# Patient Record
Sex: Female | Born: 1935 | Race: White | Hispanic: No | Marital: Married | Smoking: Never smoker
Health system: Southern US, Community
[De-identification: ages and names within clinical notes are randomized; demographics above are authoritative.]

## PROBLEM LIST (undated history)

## (undated) DIAGNOSIS — E785 Hyperlipidemia, unspecified: Secondary | ICD-10-CM

## (undated) DIAGNOSIS — I10 Essential (primary) hypertension: Secondary | ICD-10-CM

## (undated) DIAGNOSIS — I2510 Atherosclerotic heart disease of native coronary artery without angina pectoris: Secondary | ICD-10-CM

## (undated) DIAGNOSIS — R112 Nausea with vomiting, unspecified: Secondary | ICD-10-CM

## (undated) DIAGNOSIS — D649 Anemia, unspecified: Secondary | ICD-10-CM

## (undated) DIAGNOSIS — I350 Nonrheumatic aortic (valve) stenosis: Secondary | ICD-10-CM

## (undated) DIAGNOSIS — Z952 Presence of prosthetic heart valve: Secondary | ICD-10-CM

## (undated) DIAGNOSIS — I4819 Other persistent atrial fibrillation: Secondary | ICD-10-CM

## (undated) DIAGNOSIS — W19XXXA Unspecified fall, initial encounter: Secondary | ICD-10-CM

## (undated) DIAGNOSIS — N83209 Unspecified ovarian cyst, unspecified side: Secondary | ICD-10-CM

## (undated) DIAGNOSIS — N189 Chronic kidney disease, unspecified: Secondary | ICD-10-CM

## (undated) DIAGNOSIS — Z8719 Personal history of other diseases of the digestive system: Secondary | ICD-10-CM

## (undated) DIAGNOSIS — I447 Left bundle-branch block, unspecified: Secondary | ICD-10-CM

## (undated) DIAGNOSIS — I6529 Occlusion and stenosis of unspecified carotid artery: Secondary | ICD-10-CM

## (undated) DIAGNOSIS — H9190 Unspecified hearing loss, unspecified ear: Secondary | ICD-10-CM

## (undated) DIAGNOSIS — J189 Pneumonia, unspecified organism: Secondary | ICD-10-CM

## (undated) DIAGNOSIS — H5440 Blindness, one eye, unspecified eye: Secondary | ICD-10-CM

## (undated) DIAGNOSIS — K219 Gastro-esophageal reflux disease without esophagitis: Secondary | ICD-10-CM

## (undated) DIAGNOSIS — Z9889 Other specified postprocedural states: Secondary | ICD-10-CM

## (undated) DIAGNOSIS — Z951 Presence of aortocoronary bypass graft: Secondary | ICD-10-CM

## (undated) DIAGNOSIS — M1990 Unspecified osteoarthritis, unspecified site: Secondary | ICD-10-CM

## (undated) HISTORY — PX: ABDOMINAL HYSTERECTOMY: SHX81

## (undated) HISTORY — DX: Occlusion and stenosis of unspecified carotid artery: I65.29

## (undated) HISTORY — DX: Hyperlipidemia, unspecified: E78.5

## (undated) HISTORY — DX: Blindness, one eye, unspecified eye: H54.40

## (undated) HISTORY — PX: EYE SURGERY: SHX253

## (undated) HISTORY — PX: CHOLECYSTECTOMY: SHX55

## (undated) HISTORY — PX: APPENDECTOMY: SHX54

## (undated) HISTORY — DX: Other persistent atrial fibrillation: I48.19

## (undated) HISTORY — DX: Atherosclerotic heart disease of native coronary artery without angina pectoris: I25.10

## (undated) HISTORY — DX: Left bundle-branch block, unspecified: I44.7

## (undated) HISTORY — DX: Presence of aortocoronary bypass graft: Z95.1

## (undated) HISTORY — DX: Unspecified ovarian cyst, unspecified side: N83.209

## (undated) HISTORY — DX: Nonrheumatic aortic (valve) stenosis: I35.0

## (undated) HISTORY — PX: CARDIAC VALVE REPLACEMENT: SHX585

## (undated) HISTORY — DX: Essential (primary) hypertension: I10

## (undated) HISTORY — PX: NECK SURGERY: SHX720

## (undated) HISTORY — DX: Unspecified fall, initial encounter: W19.XXXA

## (undated) HISTORY — PX: BACK SURGERY: SHX140

---

## 1998-04-25 ENCOUNTER — Ambulatory Visit (HOSPITAL_COMMUNITY): Admission: RE | Admit: 1998-04-25 | Discharge: 1998-04-25 | Payer: Self-pay | Admitting: Family Medicine

## 1998-04-25 ENCOUNTER — Encounter: Payer: Self-pay | Admitting: Family Medicine

## 1998-05-14 ENCOUNTER — Ambulatory Visit (HOSPITAL_COMMUNITY): Admission: RE | Admit: 1998-05-14 | Discharge: 1998-05-14 | Payer: Self-pay | Admitting: Ophthalmology

## 1998-10-16 ENCOUNTER — Encounter: Payer: Self-pay | Admitting: Neurosurgery

## 1998-10-16 ENCOUNTER — Ambulatory Visit (HOSPITAL_COMMUNITY): Admission: RE | Admit: 1998-10-16 | Discharge: 1998-10-16 | Payer: Self-pay | Admitting: Neurosurgery

## 1998-12-18 ENCOUNTER — Encounter: Payer: Self-pay | Admitting: Neurosurgery

## 1998-12-20 ENCOUNTER — Observation Stay (HOSPITAL_COMMUNITY): Admission: RE | Admit: 1998-12-20 | Discharge: 1998-12-21 | Payer: Self-pay | Admitting: Neurosurgery

## 1998-12-20 ENCOUNTER — Encounter: Payer: Self-pay | Admitting: Neurosurgery

## 1999-04-14 ENCOUNTER — Ambulatory Visit (HOSPITAL_COMMUNITY): Admission: RE | Admit: 1999-04-14 | Discharge: 1999-04-14 | Payer: Self-pay | Admitting: Neurosurgery

## 1999-04-14 ENCOUNTER — Encounter: Payer: Self-pay | Admitting: Neurosurgery

## 1999-05-02 ENCOUNTER — Encounter: Admission: RE | Admit: 1999-05-02 | Discharge: 1999-07-31 | Payer: Self-pay | Admitting: Anesthesiology

## 1999-10-31 ENCOUNTER — Ambulatory Visit (HOSPITAL_COMMUNITY): Admission: RE | Admit: 1999-10-31 | Discharge: 1999-10-31 | Payer: Self-pay | Admitting: Neurosurgery

## 1999-10-31 ENCOUNTER — Encounter: Payer: Self-pay | Admitting: Neurosurgery

## 1999-11-06 ENCOUNTER — Encounter: Payer: Self-pay | Admitting: Neurosurgery

## 1999-11-11 ENCOUNTER — Encounter: Payer: Self-pay | Admitting: Neurosurgery

## 1999-11-11 ENCOUNTER — Ambulatory Visit (HOSPITAL_COMMUNITY): Admission: RE | Admit: 1999-11-11 | Discharge: 1999-11-11 | Payer: Self-pay | Admitting: Neurosurgery

## 2000-11-25 ENCOUNTER — Encounter: Payer: Self-pay | Admitting: Neurosurgery

## 2000-11-27 ENCOUNTER — Inpatient Hospital Stay (HOSPITAL_COMMUNITY): Admission: AD | Admit: 2000-11-27 | Discharge: 2000-11-27 | Payer: Self-pay | Admitting: Neurosurgery

## 2000-11-27 ENCOUNTER — Encounter: Payer: Self-pay | Admitting: Neurosurgery

## 2001-01-15 ENCOUNTER — Encounter: Payer: Self-pay | Admitting: Neurosurgery

## 2001-01-15 ENCOUNTER — Ambulatory Visit (HOSPITAL_COMMUNITY): Admission: RE | Admit: 2001-01-15 | Discharge: 2001-01-15 | Payer: Self-pay | Admitting: Neurosurgery

## 2002-06-15 ENCOUNTER — Encounter: Payer: Self-pay | Admitting: General Surgery

## 2002-06-15 ENCOUNTER — Encounter: Admission: RE | Admit: 2002-06-15 | Discharge: 2002-06-15 | Payer: Self-pay | Admitting: General Surgery

## 2002-06-17 ENCOUNTER — Encounter (INDEPENDENT_AMBULATORY_CARE_PROVIDER_SITE_OTHER): Payer: Self-pay | Admitting: *Deleted

## 2002-06-17 ENCOUNTER — Ambulatory Visit (HOSPITAL_BASED_OUTPATIENT_CLINIC_OR_DEPARTMENT_OTHER): Admission: RE | Admit: 2002-06-17 | Discharge: 2002-06-17 | Payer: Self-pay | Admitting: General Surgery

## 2002-07-25 ENCOUNTER — Ambulatory Visit (HOSPITAL_COMMUNITY): Admission: RE | Admit: 2002-07-25 | Discharge: 2002-07-25 | Payer: Self-pay | Admitting: Neurosurgery

## 2002-07-25 ENCOUNTER — Encounter: Payer: Self-pay | Admitting: Neurosurgery

## 2002-09-14 ENCOUNTER — Inpatient Hospital Stay (HOSPITAL_COMMUNITY): Admission: RE | Admit: 2002-09-14 | Discharge: 2002-09-15 | Payer: Self-pay | Admitting: Neurosurgery

## 2002-09-14 ENCOUNTER — Encounter: Payer: Self-pay | Admitting: Neurosurgery

## 2003-01-24 ENCOUNTER — Encounter: Payer: Self-pay | Admitting: Neurosurgery

## 2003-01-24 ENCOUNTER — Encounter: Admission: RE | Admit: 2003-01-24 | Discharge: 2003-01-24 | Payer: Self-pay | Admitting: Neurosurgery

## 2003-08-27 ENCOUNTER — Ambulatory Visit (HOSPITAL_COMMUNITY): Admission: RE | Admit: 2003-08-27 | Discharge: 2003-08-27 | Payer: Self-pay | Admitting: Neurosurgery

## 2003-08-31 ENCOUNTER — Encounter: Admission: RE | Admit: 2003-08-31 | Discharge: 2003-08-31 | Payer: Self-pay | Admitting: Neurosurgery

## 2006-05-05 ENCOUNTER — Ambulatory Visit (HOSPITAL_COMMUNITY): Admission: RE | Admit: 2006-05-05 | Discharge: 2006-05-05 | Payer: Self-pay | Admitting: Neurosurgery

## 2006-10-29 ENCOUNTER — Ambulatory Visit (HOSPITAL_COMMUNITY): Admission: RE | Admit: 2006-10-29 | Discharge: 2006-10-29 | Payer: Self-pay | Admitting: Neurosurgery

## 2007-07-08 HISTORY — PX: CORONARY ARTERY BYPASS GRAFT: SHX141

## 2007-07-08 HISTORY — PX: AORTIC VALVE REPLACEMENT: SHX41

## 2007-07-16 ENCOUNTER — Inpatient Hospital Stay (HOSPITAL_BASED_OUTPATIENT_CLINIC_OR_DEPARTMENT_OTHER): Admission: RE | Admit: 2007-07-16 | Discharge: 2007-07-16 | Payer: Self-pay | Admitting: *Deleted

## 2007-07-20 ENCOUNTER — Ambulatory Visit (HOSPITAL_COMMUNITY): Admission: RE | Admit: 2007-07-20 | Discharge: 2007-07-20 | Payer: Self-pay | Admitting: *Deleted

## 2007-07-20 ENCOUNTER — Encounter (INDEPENDENT_AMBULATORY_CARE_PROVIDER_SITE_OTHER): Payer: Self-pay | Admitting: *Deleted

## 2007-07-22 ENCOUNTER — Ambulatory Visit: Payer: Self-pay | Admitting: Thoracic Surgery (Cardiothoracic Vascular Surgery)

## 2007-07-28 ENCOUNTER — Encounter: Payer: Self-pay | Admitting: Thoracic Surgery (Cardiothoracic Vascular Surgery)

## 2007-07-28 ENCOUNTER — Inpatient Hospital Stay (HOSPITAL_COMMUNITY)
Admission: RE | Admit: 2007-07-28 | Discharge: 2007-08-04 | Payer: Self-pay | Admitting: Thoracic Surgery (Cardiothoracic Vascular Surgery)

## 2007-07-28 ENCOUNTER — Ambulatory Visit: Payer: Self-pay | Admitting: Thoracic Surgery (Cardiothoracic Vascular Surgery)

## 2007-07-28 DIAGNOSIS — Z951 Presence of aortocoronary bypass graft: Secondary | ICD-10-CM | POA: Insufficient documentation

## 2007-07-28 DIAGNOSIS — Z953 Presence of xenogenic heart valve: Secondary | ICD-10-CM | POA: Insufficient documentation

## 2007-08-20 ENCOUNTER — Ambulatory Visit: Payer: Self-pay | Admitting: Thoracic Surgery (Cardiothoracic Vascular Surgery)

## 2007-08-20 ENCOUNTER — Encounter
Admission: RE | Admit: 2007-08-20 | Discharge: 2007-08-20 | Payer: Self-pay | Admitting: Thoracic Surgery (Cardiothoracic Vascular Surgery)

## 2007-09-06 ENCOUNTER — Ambulatory Visit: Payer: Self-pay | Admitting: Thoracic Surgery (Cardiothoracic Vascular Surgery)

## 2007-09-06 ENCOUNTER — Ambulatory Visit (HOSPITAL_COMMUNITY)
Admission: RE | Admit: 2007-09-06 | Discharge: 2007-09-06 | Payer: Self-pay | Admitting: Thoracic Surgery (Cardiothoracic Vascular Surgery)

## 2007-10-07 ENCOUNTER — Inpatient Hospital Stay (HOSPITAL_COMMUNITY): Admission: RE | Admit: 2007-10-07 | Discharge: 2007-10-08 | Payer: Self-pay | Admitting: Ophthalmology

## 2008-04-16 IMAGING — CR DG CHEST 2V
2 series · 2 of 2 positions shown · non-contrast
Comparison: 08/20/07.

CLINICAL DATA: CABG July 2007. 
 CHEST - 2 VIEW:

[w chest pa]
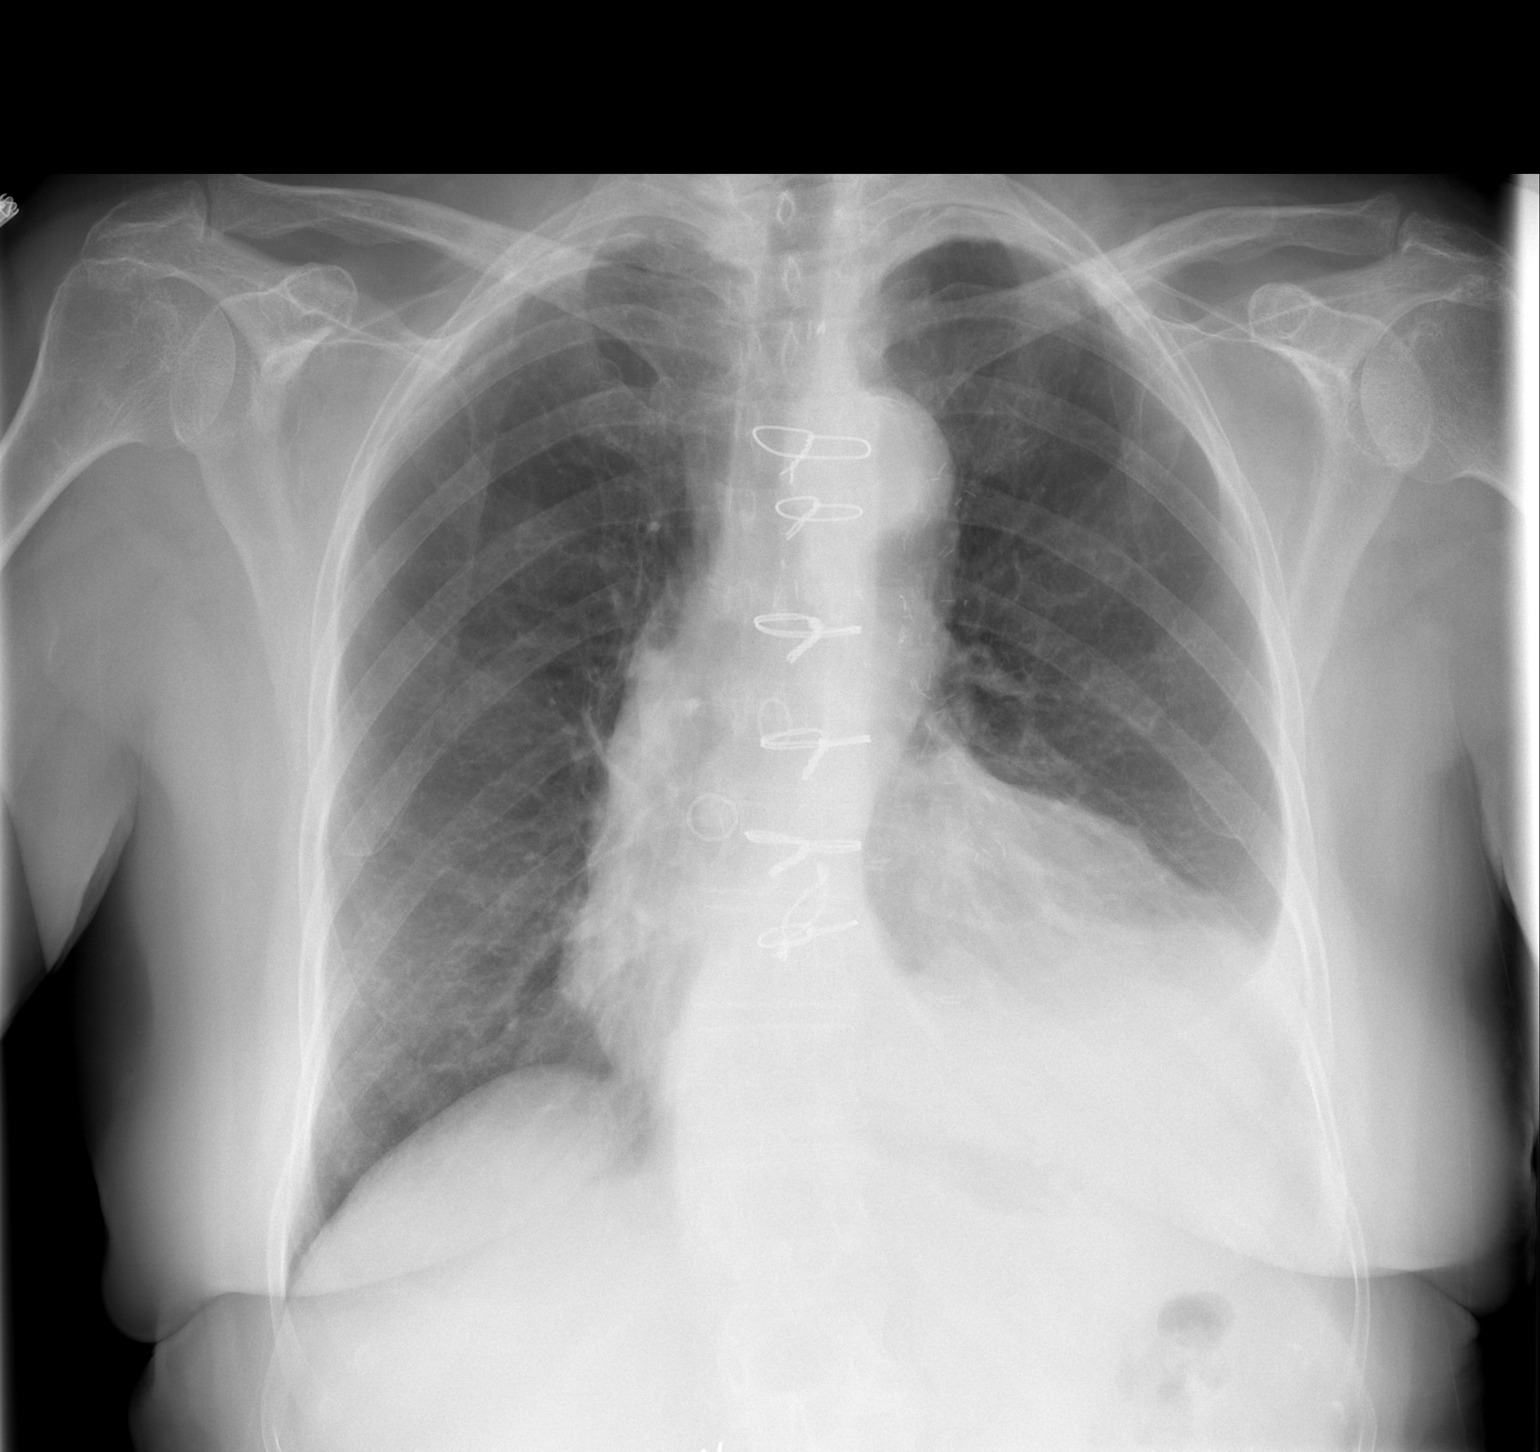

[w chest lat]
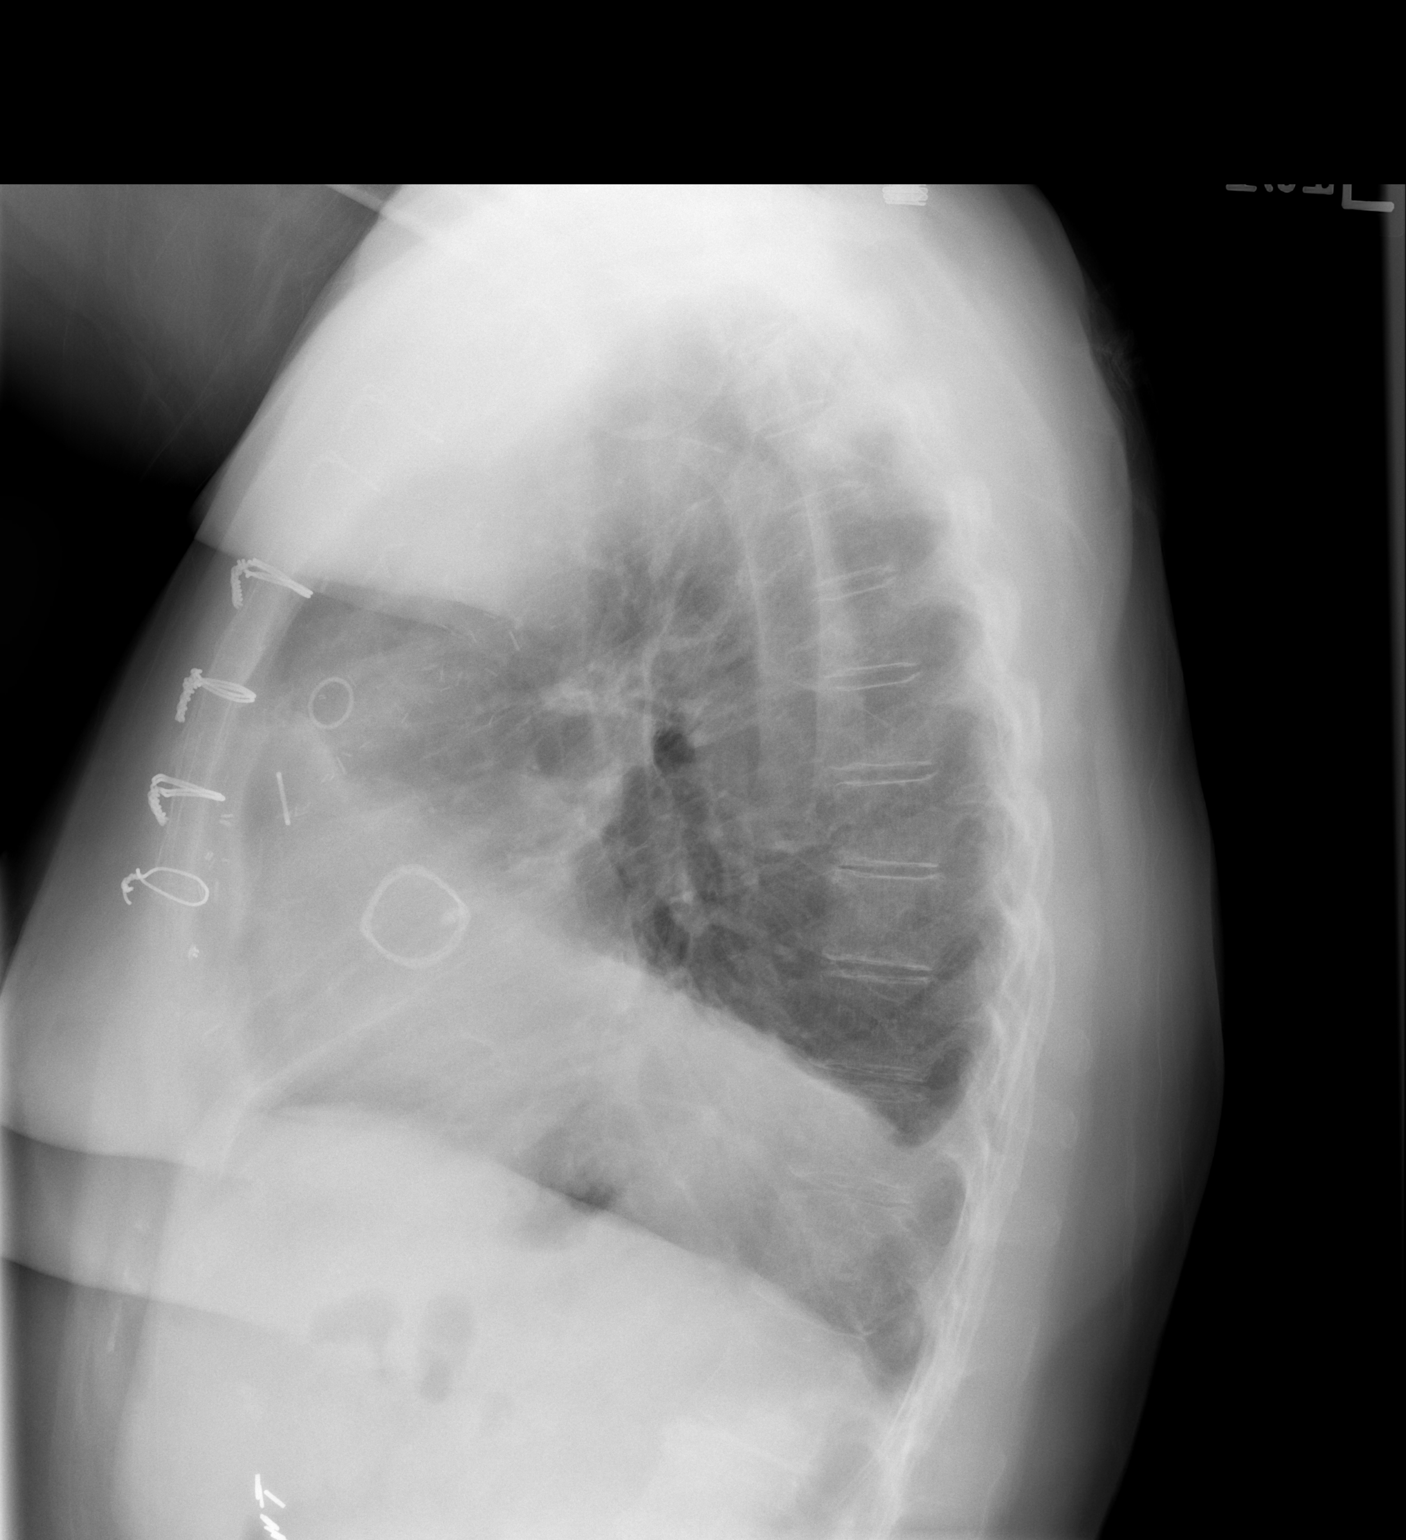

[2 of 2 positions shown; findings below may reference images not displayed]

FINDINGS: There is little change in volume of the left pleural effusion with left basilar atelectasis present.  The right lung is clear.  No pneumothorax is seen.  Cardiomegaly is stable.
IMPRESSION: Little change in left pleural effusion with left basilar atelectasis.

## 2008-12-13 ENCOUNTER — Encounter: Admission: RE | Admit: 2008-12-13 | Discharge: 2008-12-13 | Payer: Self-pay | Admitting: Neurosurgery

## 2009-01-31 ENCOUNTER — Ambulatory Visit: Payer: Self-pay | Admitting: Vascular Surgery

## 2009-07-18 ENCOUNTER — Ambulatory Visit: Payer: Self-pay | Admitting: Vascular Surgery

## 2010-03-27 ENCOUNTER — Ambulatory Visit: Payer: Self-pay | Admitting: Cardiology

## 2010-03-29 ENCOUNTER — Ambulatory Visit: Payer: Self-pay | Admitting: Cardiology

## 2010-07-18 ENCOUNTER — Ambulatory Visit
Admission: RE | Admit: 2010-07-18 | Discharge: 2010-07-18 | Payer: Self-pay | Source: Home / Self Care | Attending: Vascular Surgery | Admitting: Vascular Surgery

## 2010-07-27 NOTE — Procedures (Unsigned)
CAROTID DUPLEX EXAM  INDICATION:  Carotid disease.  HISTORY: Diabetes:  No. Cardiac:  Open heart surgery in 2009. Hypertension:  No. Smoking:  No. Previous Surgery:  No. CV History:  Complaint of dizziness. Amaurosis Fugax No, Paresthesias No, Hemiparesis No.                                      RIGHT             LEFT Brachial systolic pressure:         154               156 Brachial Doppler waveforms:         Normal            Normal Vertebral direction of flow:        Antegrade         Antegrade DUPLEX VELOCITIES (cm/sec) CCA peak systolic                   90                75 ECA peak systolic                   125               154 ICA peak systolic                   127               104 ICA end diastolic                   41                26 PLAQUE MORPHOLOGY:                  Mixed             Mixed PLAQUE AMOUNT:                      Mild              Minimal PLAQUE LOCATION:                    ICA/CCA           ICA/CCA  IMPRESSION: 1. Doppler velocities suggest a 40% to 59% stenosis of the right     proximal internal carotid artery. 2. No hemodynamically significant stenosis of the left internal     carotid artery noted with mild plaque formations as described     above. 3. No significant change noted when compared to the previous     examination on 07/18/2009.  ___________________________________________ Janetta Hora. Fields, MD  CH/MEDQ  D:  07/18/2010  T:  07/18/2010  Job:  [id number]

## 2010-07-28 ENCOUNTER — Encounter: Payer: Self-pay | Admitting: Thoracic Surgery (Cardiothoracic Vascular Surgery)

## 2010-08-09 ENCOUNTER — Encounter: Payer: Self-pay | Admitting: Cardiology

## 2010-08-09 DIAGNOSIS — H5440 Blindness, one eye, unspecified eye: Secondary | ICD-10-CM | POA: Insufficient documentation

## 2010-08-09 DIAGNOSIS — M47819 Spondylosis without myelopathy or radiculopathy, site unspecified: Secondary | ICD-10-CM | POA: Insufficient documentation

## 2010-08-09 DIAGNOSIS — I10 Essential (primary) hypertension: Secondary | ICD-10-CM | POA: Insufficient documentation

## 2010-08-09 DIAGNOSIS — E785 Hyperlipidemia, unspecified: Secondary | ICD-10-CM | POA: Insufficient documentation

## 2010-08-09 DIAGNOSIS — R0602 Shortness of breath: Secondary | ICD-10-CM | POA: Insufficient documentation

## 2010-09-26 ENCOUNTER — Ambulatory Visit: Payer: Self-pay | Admitting: Cardiology

## 2010-10-10 ENCOUNTER — Ambulatory Visit: Payer: Self-pay | Admitting: Cardiology

## 2010-10-25 ENCOUNTER — Other Ambulatory Visit: Payer: Self-pay | Admitting: *Deleted

## 2010-10-25 DIAGNOSIS — E7800 Pure hypercholesterolemia, unspecified: Secondary | ICD-10-CM

## 2010-11-01 ENCOUNTER — Encounter: Payer: Self-pay | Admitting: Cardiology

## 2010-11-01 ENCOUNTER — Ambulatory Visit (INDEPENDENT_AMBULATORY_CARE_PROVIDER_SITE_OTHER): Payer: PRIVATE HEALTH INSURANCE | Admitting: Cardiology

## 2010-11-01 ENCOUNTER — Other Ambulatory Visit: Payer: Self-pay | Admitting: Cardiology

## 2010-11-01 ENCOUNTER — Other Ambulatory Visit (INDEPENDENT_AMBULATORY_CARE_PROVIDER_SITE_OTHER): Payer: PRIVATE HEALTH INSURANCE | Admitting: *Deleted

## 2010-11-01 DIAGNOSIS — I10 Essential (primary) hypertension: Secondary | ICD-10-CM

## 2010-11-01 DIAGNOSIS — E7800 Pure hypercholesterolemia, unspecified: Secondary | ICD-10-CM

## 2010-11-01 DIAGNOSIS — Z953 Presence of xenogenic heart valve: Secondary | ICD-10-CM

## 2010-11-01 DIAGNOSIS — Z954 Presence of other heart-valve replacement: Secondary | ICD-10-CM

## 2010-11-01 DIAGNOSIS — E785 Hyperlipidemia, unspecified: Secondary | ICD-10-CM

## 2010-11-01 LAB — HEPATIC FUNCTION PANEL
AST: 18 U/L (ref 0–37)
Alkaline Phosphatase: 69 U/L (ref 39–117)
Total Bilirubin: 1.1 mg/dL (ref 0.3–1.2)

## 2010-11-01 LAB — BASIC METABOLIC PANEL
GFR: 57.98 mL/min — ABNORMAL LOW (ref >60.00)
Potassium: 4.3 mEq/L (ref 3.5–5.1)
Sodium: 139 mEq/L (ref 135–145)

## 2010-11-01 LAB — LIPID PANEL
Total CHOL/HDL Ratio: 5
VLDL: 24.2 mg/dL (ref 0.0–40.0)

## 2010-11-01 NOTE — Assessment & Plan Note (Signed)
Blood pressure is elevated today. I'll start her on hydrochlorothiazide 25 mg a day in addition to her amlodipine and benazepril. I'll have her see Lawson Fiscal in one month for followup lab work. I'll have her see Dr. Antoine Poche in 6 months for followup cardiology care.

## 2010-11-01 NOTE — Assessment & Plan Note (Signed)
She is a soft systolic outflow murmur but otherwise is doing well.

## 2010-11-01 NOTE — Progress Notes (Signed)
Subjective:   [name] is seen today for a followup visit. She has known coronary artery bypass grafting in January 2009 and previous aortic valve replacement with a #21 mm pericardial prosthesis. She had a left internal mammary artery graft to the LAD, saphenous vein graft the right coronary artery, and a sequential saphenous vein graft to obtuse marginal #1 and #2. She had a septal myomectomy because of a small LV outflow tract. She is continued to do well. She doesn't have any recurrent symptoms of chest pain but does have occasional dizziness. Blood pressure readings are elevated today.  Current Outpatient Prescriptions  Medication Sig Dispense Refill  . aspirin 81 MG tablet Take 81 mg by mouth daily.        Marland Kitchen atorvastatin (LIPITOR) 10 MG tablet Take 10 mg by mouth daily.        . benazepril (LOTENSIN) 20 MG tablet Take 20 mg by mouth daily.        . Brimonidine Tartrate-Timolol (COMBIGAN OP) Apply to eye 2 (two) times daily. RIGHT EYE       . Calcium Carbonate (CALTRATE 600 PO) Take by mouth daily.        Marland Kitchen gabapentin (NEURONTIN) 300 MG capsule Take 300 mg by mouth daily.        Marland Kitchen HOMATROPINE HBR OP Apply to eye 2 (two) times daily. Right eye       . prednisoLONE acetate (PRED FORTE) 1 % ophthalmic suspension Place 1 drop into the right eye 2 (two) times daily.        . simvastatin (ZOCOR) 40 MG tablet Take 40 mg by mouth daily.        . Vitamin D, Ergocalciferol, (DRISDOL) 50000 UNITS CAPS Take 50,000 Units by mouth every 7 (seven) days.        Marland Kitchen zolpidem (AMBIEN) 5 MG tablet Take 5 mg by mouth at bedtime. 10MG  HS        . amLODipine (NORVASC) 5 MG tablet Take 5 mg by mouth daily.          Allergies  Allergen Reactions  . Zetia (Ezetimibe) Other (See Comments)    DIZZINESS    Patient Active Problem List  Diagnoses  . SOB (shortness of breath) on exertion  . S/P aortic valve replacement with bioprosthetic valve  . Dyslipidemia  . Hypertension  . Blindness of right eye  . Spinal  arthritis    History  Smoking status  . Never Smoker   Smokeless tobacco  . Not on file    History  Alcohol Use No    Family History  Problem Relation Age of Onset  . Cancer Mother   . Heart attack Father   . Heart attack Brother     Review of Systems:   The patient denies any heat or cold intolerance.  No weight gain or weight loss.  The patient denies headaches or blurry vision.  There is no cough or sputum production.  The patient denies dizziness.  There is no hematuria or hematochezia.  The patient denies any muscle aches or arthritis.  The patient denies any rash.  The patient denies frequent falling or instability.  There is no history of depression or anxiety.  All other systems were reviewed and are negative.   Physical Exam:   Weight is 163. Blood pressure is 150/108 sitting, heart rate 64. There's a soft systolic outflow murmur. The aortic valve sounds are normal.The head is normocephalic and atraumatic.  Pupils are equally round and  reactive to light.  Sclerae nonicteric.  Conjunctiva is clear.  Oropharynx is unremarkable.  There's adequate oral airway.  Neck is supple there are no masses.  Thyroid is not enlarged.  There is no lymphadenopathy.  Lungs are clear.  Chest is symmetric.  Heart shows a regular rate and rhythm.  S1 and S2 are normal.    Abdomen is soft normal bowel sounds.  There is no organomegaly.  Genital and rectal deferred.  Extremities are without edema.  Peripheral pulses are adequate.  Neurologically intact.  Full range of motion.  The patient is not depressed.  Skin is warm and dry.  Assessment / Plan:

## 2010-11-01 NOTE — Assessment & Plan Note (Signed)
Lab work today.

## 2010-11-07 ENCOUNTER — Telehealth: Payer: Self-pay | Admitting: *Deleted

## 2010-11-07 NOTE — Telephone Encounter (Signed)
Verified pt is taking Lipitor 10mg  daily; explained to pt we would call back with lab results after discussing the Lipitor with Dr. Deborah Chalk

## 2010-11-11 ENCOUNTER — Telehealth: Payer: Self-pay | Admitting: *Deleted

## 2010-11-11 NOTE — Telephone Encounter (Signed)
Would try to increase the Lipitor to 20mg . Recheck labs in 3 months.

## 2010-11-11 NOTE — Telephone Encounter (Signed)
Instructed pt per lab work results to increase Lipitor 20mg  daily per Norma Fredrickson, NP

## 2010-11-19 NOTE — Assessment & Plan Note (Signed)
OFFICE VISIT   [name], [name]  DOB:  [birth year]                                        September 06, 2007  CHART #:  [id number]   The patient is a 77 year old woman who had aortic valve replacement and  coronary bypass grafting x4 on January 21.  She was seen in the office  on February 13 at which time she was making slow but steady progress,  and was beginning to be making a little more rapid progress.  She had a  left pleural effusion at that time.  She was treated with diuretics and  a steroid taper.  She now returns for followup.  She states that overall  she feels better than she did when I last saw her, but that she has had  a couple of episodes over the past 2 weeks where she got swimmy  headed.  I take this to mean that she was lightheaded or dizzy.  This  was not orthostatic in nature.  It was not with a sudden change in  position.  At one point she was standing, but had been standing for a  while, and the other time it happened to her while she was sitting.  This would last a couple of minutes and then resolve and not happen  again.  She did not notice any rapid or irregular heart rates.  She did  not have any stroke or TIA-like symptoms, and she was not terribly  concerned by it, but thought she would mention it today.   PHYSICAL EXAMINATION:  The patient is a 77 year old white female in no  acute distress.  Her blood pressure is 145/85, pulse is 82 and regular,  respirations are 18.  Her oxygen saturation is 97% on room air.  Her  lungs have diminished breath sounds at the bases, otherwise clear.  Cardiac exam has a regular rate and rhythm, normal S1 and S2, no murmur.  Her sternum is healing well.  The sternum is stable, the incision is  clean, dry, and intact.  Leg incision is healing well.   Chest x-ray today shows still some mild elevation of the left  hemidiaphragm.  There is no significant residual effusion.   IMPRESSION:  The patient is  now about 6 weeks out from coronary bypass  grafting and aortic valve replacement.  She is doing very well at this  point in time overall.  She is making progress.  She looks much better  than she did 3 weeks ago.  She has not had any chest pain.  Her  incisional pain is minimal.  She has not having to take any narcotics.  She still is in the recovery phase, and I am not sure what to make of  the episodes of lightheadedness.  It does not sound TIA like, and is not  related to changes in positional orthostasis.  She still is on several  medications including Crestor, metoprolol, amiodarone, aspirin, and iron  tablets.  Just to try and simplify matters a little bit, I recommended  that she stop the amiodarone as she is now 6 weeks out from surgery, and  also stop the iron tablets.  She is going to see Dr. Reyes Ivan on Friday  and will notify him of those medication changes.  I will be happy to see  her back at any time that I could be of any further assistance with her  care, but as she is going to be followed by Dr. Reyes Ivan, she does not  need to make any scheduled appointments with me at this time.   Salvatore Decent Dorris Fetch, M.D.  Electronically Signed   SCH/MEDQ  D:  09/06/2007  T:  09/06/2007  Job:  [id number]   cc:   Elmore Guise., M.D.  Richard Derek Mound A. Pool, M.D.

## 2010-11-19 NOTE — Consult Note (Signed)
NEW PATIENT CONSULTATION   KAMILA, BRODA  DOB:  10/13/1932                                        July 22, 2007  CHART #:  [id number]   REASON FOR CONSULTATION:  Aortic stenosis and 3-vessel coronary disease.   HISTORY OF PRESENT ILLNESS:  The patient is a 77 year old woman with a  known history of aortic stenosis which has been mild to moderate  previously.  Over the past several months, she has been having a great  deal of difficulty with pain.  She had neck surgery apparently a fusion  of C5-C6, although I do not have the records, by Dr. Jordan Likes.  But she  continued to have pain in her shoulder and arm and also the pain  radiated up into her neck.  She also says that she has been having some  indigestion type discomfort and some associated shortness of breath but  she denies any discrete chest pain.  She mentioned these symptoms to one  of the physicians and it was recommended that she be worked up by a  cardiologist.  She saw Dr. Lady Deutscher.  She had a nuclear stress test which showed a  reversible inferolateral defect.  She had normal left ventricular  systolic function.  Her ejection fraction was 62%.  On an echocardiogram  she had moderate aortic stenosis with an aortic valve area of 1.2-cm2.  The peak gradient was 28-mmHg, mean gradient was 17-mmHg.  She  subsequently had a cardiac catheterization performed where she was found  to have severe 3-vessel coronary disease.  She had normal right sided  pressures, although her wedge was elevated at 20.  Her cardiac index was  1.9.  Her aortic pressure was 175/107 with a left ventricular pressure  of 210/18.  Her LV EDP was 29.  She only had an approximately 10-mm  gradient on pullback, although there was a 35-mm pressure difference.  She then subsequently had a transesophageal echocardiogram which showed  thickening and moderate calcification of the aortic valve consistent  with moderate aortic stenosis.  The patient has continued to have her episodes of pain.  She has had  some with minimal exertion.   CURRENT MEDICATIONS:  1. Neurontin 300 mg b.i.d.  2. Crestor 20 mg daily.  3. Lotrel 5/20 one tablet daily.  4. Aspirin 81 mg daily.  5. She uses Ambien p.r.n. for sleep.  6. Ultram p.r.n. for pain about once a day for her neck pain.   She has no known drug allergies.   FAMILY HISTORY:  Significant for coronary artery disease in a brother.  Her father also died of a heart attack.  She also has a daughter with  heart disease.   SOCIAL HISTORY:  She works at a [employer] as a Production assistant, radio.  She does  not smoke or drink.  She is married and lives with her husband.   REVIEW OF SYSTEMS:  MUSCULOSKELETAL:  She complains of arthritis type  pain.  EYES:  She also has had a bleeding vessel in her eye.  She sees Dr.  Gwendalyn Ege in [place] regarding that.  She has had multiple shots in  the eye but has not required laser surgery.  She does see black spots.  NEUROLOGIC:  She has not had any stroke or amaurosis fugax symptoms.  All other  systems are negative.   PHYSICAL EXAMINATION:  General:  The patient is a 77 year old white  female in no acute distress.  Vital signs:  Her blood pressure is  165/82, pulse 68, respirations are 18.  Her oxygen saturation is 94% on  room air.  Neurologic:  She is alert and oriented x3, appropriate with  no focal deficits.  HEENT:  She is edentulous.  She is wearing glasses.  Neck:  Supple without thyromegaly or adenopathy.  She does have a right  carotid bruit.  Cardiac:  Regular rate and rhythm.  There is a 3/6  systolic murmur.  Lungs:  Clear with equal breath sounds bilaterally.  She has 2 plus pulses throughout the periphery.  There is no peripheral  edema.  Abdomen:  Soft and nontender.   LABORATORY DATA:  Cardiac catheterization and echocardiogram as noted.  She did have carotid duplex which showed a 60-80% stenosis in the right  internal carotid.   There was no left internal carotid disease.   IMPRESSION:  The patient is a 77 year old woman with severe 3-vessel  coronary disease with progressive anginal symptoms.  She also has  moderate aortic stenosis with some calcification of the valve leaflets.   I have recommended to her that she have coronary artery bypass grafting  along with an aortic valve replacement at the time of her coronary  artery bypass grafting.  The indications, risks, benefits, and  alternatives were discussed in detail with the patient.  She understands  the indication for CABG is survival benefit as well as relief of  symptoms.  She does understand that it is a relative indication to  replacement the aortic valve but that I would highly advise it in her  situation.  The operation may be technically challenging due to the  angulation of the heart and the elongation of the ascending aorta.  I  did recommend that we use a tissue valve for the aortic valve  replacement to avoid the need for lifelong anticoagulation.  We  discussed in detail the indications, risks, benefits, and alternatives.  She understands the risks include, but are not limited to, death,  stroke, MI, DVT, PE, bleeding, possible need for transfusions,  infections, as well as other organ system dysfunction including  respiratory, renal, or GI complications, possible complete heart block  requiring permanent transvenous pacemaker.  She understands and accepts  these risks and agrees to proceed.  We have scheduled her for surgery on  Wednesday, January 21st.  She will be admitted on the day of the  procedure.   Salvatore Decent Dorris Fetch, M.D.  Electronically Signed   SCH/MEDQ  D:  07/22/2007  T:  07/22/2007  Job:  [id number]   cc:   Elmore Guise., M.D.  Richard Watt Climes

## 2010-11-19 NOTE — Op Note (Signed)
NAMEELICIA, LUI NO.:  [id number]   MEDICAL RECORD NO.:  [id number]          PATIENT TYPE:  INP   LOCATION:  2302                         FACILITY:  [facility]   PHYSICIAN:  Burna Forts, M.D.DATE OF BIRTH:  21-Jul-1932   DATE OF PROCEDURE:  07/28/2007  DATE OF DISCHARGE:                               OPERATIVE REPORT   INTRAOPERATIVE TRANSESOPHAGEAL ECHOCARDIOGRAPHIC REPORT:   INDICATIONS FOR PROCEDURE:  Ms. Biskup is a 77 year old female  patient, who has a history of coronary artery disease and aortic  stenosis.  She arrived at the OR today for coronary artery bypass  grafting and aortic valve replacement, to be performed by Dr. Andrey Spearman.  On the morning of surgery, she was brought to the holding  area, where under local anesthesia with sedation, pulmonary catheter and  a radial arterial monitor were inserted.  She was taken to the OR for  induction of general anesthesia, after which the TEE probe is lubricated  and protected, passed oropharyngeal into the stomach and slightly  withdrawn for imaging of the cardiac structures.   PRE-CARDIOPULMONARY BYPASS TEE EXAM:  LEFT VENTRICLE:  The left ventricular chamber is seen initially in the  short-axis view.  It is concentrically hypertrophied in all aspects and  in all segmental areas.  There is good to excellent overall contractile  pattern noted with good thickening of the walls in all areas, consistent  with an excellent left ventricular function.  There is low volume  status, such that papillary muscles are nearly touching one another.  Long axis view again reveals good overall contractile pattern noted, but  again left ventricular wall thickness.  MITRAL VALVE:  This is a thickened mitral valve apparatus seen.  Both  leaflets are visualized well.  Anterior and posterior leaflets multiple  views are obtained.  Again, arteries are somewhat thickening, both with  posterior and anterior leaflets.   However, motion function and  coaptation appear to be satisfactory and, on color Doppler examination,  there is only trivial mitral regurgitant flow noted, again in multiple  views.  LEFT ATRIAL CHAMBER:  Is an essentially normal chamber in size and  function.  The appendage is clear.  No masses are noted within.  AORTIC VALVE:  The aortic valve on initial short axis appearance appears  to be three cusps.  There is calcium noted in the periphery and the  edges of the valve itself.  All leaflets are somewhat mobile, but  restricted in their overall motion, both left, right and noncoronary  cusps are seen.  Plain imagery exam reveals about a 1.1 to 1.2 aortic  valve area.  Deep transgastric view could not be obtained to  satisfactorily estimate aortic valve gradients, but all appearances were  in from previous echo prior to surgery, indicative of moderate aortic  stenosis.  In the left ventricular outflow tract, there was seen a  slight knuckle in the high septal area, which did appear to narrow the  left ventricular outflow tract somewhat.  The measurement, just in about  1 cm in the subaortic area, revealed a  diameter of the left ventricular  area of only about 1.4 cm.  At the level of the aortic valve itself,  there is a 2 cm diameter at the level of the sinotubular junction, and  were dilated at 2.2 cm in diameter.  Above this level, the aortic valve  and this stenotic jet was noted ascending aortic dilatation.  Doppler  color examination across the aortic valve reveals 2+ jet, fairly centric  in its location, but expanding on ascending into the ascending aorta.  There is essentially no regurgitant flow appreciated.  Again, all this  was consistent with moderate aortic stenosis.  RIGHT VENTRICLE:  The right ventricle is considered normal in its  chamber size and function.  Tricuspid valve is seen and appears normal.  There is no regurgitant flow across there.  The right atrium is of   normal chamber size.   The patient was placed on cardiopulmonary bypass.  Coronary artery  bypass grafting is carried out, followed by aortotomy and replacement of  the aortic valve with a #21 pericardial tissue valve.  De-airing  maneuvers were carried out and the patient was rewarmed and separated  from cardiopulmonary bypass with the initial attempt.   POST-CARDIOPULMONARY TEE EXAMINATION:  (Limited exam)  LEFT VENTRICLE IN THE EARLY BYPASS:  This was remarkable for good  contractility noted in posterolateral and anterior walls with a  definitely flattened septum in this early bypass.  Overall contractility  remained good and this was a paced ventricle.  AORTIC VALVE AREA:  In the aortic valve, the valve was seated well.  The  leaflets could easily be visualized, opened appropriately during  systolic ejection, closed satisfactorily, with no regurgitant flow  appreciated.  This appeared to be a totally satisfactory repair in the  aortic valve position.  The rest of the cardiac examination was as  previously described and patient was ultimately returned to the cardiac  intensive care unit in stable condition.           ______________________________  Burna Forts, M.D.     JTM/MEDQ  D:  07/28/2007  T:  07/28/2007  Job:  [id number]

## 2010-11-19 NOTE — Assessment & Plan Note (Signed)
OFFICE VISIT   [name], [name]  DOB:  [birth year]                                        August 20, 2007  CHART #:  [id number]   ADDENDUM:  [name] did note some numbness in the 4th and 5th  fingers on her right hand. 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 I encouraged her to do  some squeezing exercises. Her motor function is intact and this should  improve with time.   Salvatore Decent Dorris Fetch, M.D.  Electronically Signed   SCH/MEDQ  D:  08/20/2007  T:  08/22/2007  Job:  [id number]

## 2010-11-19 NOTE — Op Note (Signed)
[name], [name] NO.:  [id number]   MEDICAL RECORD NO.:  [id number]          PATIENT TYPE:  INP   LOCATION:  5123                         FACILITY:  [facility]   PHYSICIAN:  Chalmers Guest, M.D.     DATE OF BIRTH:  May 13, 1933   DATE OF PROCEDURE:  10/07/2007  DATE OF DISCHARGE:                               OPERATIVE REPORT   PREOPERATIVE DIAGNOSIS:  Uncontrolled neovascular glaucoma right eye.   POSTOPERATIVE DIAGNOSIS:  Uncontrolled neovascular glaucoma right eye.   PROCEDURE:  Ahmed tube shunt with Tutoplast scleral graft for scleral  reinforcement and the mitomycin C.   PROCEDURE:  The patient was transported to the operating room where the  anesthetic which consisted of 2% Xylocaine with epinephrine in a 50/50  mixture with 0.75% Marcaine with an ampul of Wydase was given in a  peribulbar fashion.  Pressure was applied to the globe and then the  patient's face was prepped and draped in the usual sterile fashion.  Following this, with the surgeon sitting superiorly, a 6-0 nylon suture  was passed through clear cornea to infraduct the eye.  With the eye and  infraducted position, a Bishop-Harmon forceps was used to grasp the  conjunctiva at the limbus, a sharp scissors were used to make an  incision and then blunt dissection was carried out forming a fornix-  based conjunctival flap being careful to manipulate the very fragile and  thin conjunctiva.  After the conjunctiva had been recessed posteriorly,  a Tooke blade was used to recess tenons fibers and bleeding was  controlled with cautery.  Following this, mitomycin C 0.4 mg/mL was  placed on a Gelfoam sponge.  It was placed posteriorly in the superior  temporal cul-de-sac for two minutes and then removed and irrigated with  60 mL of balanced salt solution.  Following this, the Ahmed plate was  examined.  The plate was irrigated and primed and noted to pass the  fluid very well.  This was done with the aid  balanced salt solution.  Following this, the Ahmed plate was placed with the eyelets 8 mm  posterior to the scleral limbus.  It was noted at this point that the  conjunctiva was thin and a small buttonhole had formed.  At this point,  using a 6-0 Mersilene suture, the Ahmed plate was sutured to the sclera.  Following this, it was necessary for scleral reinforcement because of a  fragile conjunctiva to use the Tutoplast graft.  The Tutoplast was  fashioned to cover the tube.  A 22-gauge needle was injected and the  anterior chamber after paracentesis track had been performed at the 7  o'clock position and Provisc had been injected in the eye.  The 22-gauge  needle was passed at the limbus at the 11 o'clock position and then  using the tube inserter, the tube was inserted after the tube had been  trimmed bevel up and 9-0 nylon suture was used to suture the tube to  this sclera and then the scleral reinforcement where the Tutoplast took  place by suturing three interrupted 9-0 nylon  sutures.  The scleral  reinforcement was necessary because the conjunctiva at this point was  noted to have two large buttonholes.  The conjunctiva was then brought  forward and sutured.  The conjunctiva just barely missed the limbus but  the Tutoplast graft covered the tube very well and there were posterior  buttonholes that could not be closed because of the fragile conjunctiva  and inferior temporal 5/5 block of conjunctiva was excised and rotated  into position over the button hole and sutured with a 9-0 nylon suture.  Following this, an 8-0 Vicryl suture was used to close the conjunctiva.  BSS was injected.  There was a watertight closure.  No leakage was  noted.  Following this, a subconjunctival injection of Kenalog 4 mg was  given inferior subconjunctival with a 30-gauge needle.  Additional  viscoelastic was injected from the eye through the paracentesis tract  with BSS maintaining the anterior chamber.   Topical TobraDex ointment  was applied to the eye.  A patch and Fox shield were placed and the  patient returned to recovery area in stable condition.      Chalmers Guest, M.D.  Electronically Signed     RW/MEDQ  D:  10/08/2007  T:  10/08/2007  Job:  [id number]   cc:   Fax [phone]

## 2010-11-19 NOTE — Procedures (Signed)
CAROTID DUPLEX EXAM   INDICATION:  Carotid disease.   HISTORY:  Diabetes:  No.  Cardiac:  Open heart surgery in 2009.  Hypertension:  No.  Smoking:  No.  Previous Surgery:  No.  CV History:  Positional dizziness.  Amaurosis Fugax No, Paresthesias No, Hemiparesis No                                       RIGHT             LEFT  Brachial systolic pressure:         140               138  Brachial Doppler waveforms:         Normal            Normal  Vertebral direction of flow:        Antegrade         Antegrade  DUPLEX VELOCITIES (cm/sec)  CCA peak systolic                   85                70  ECA peak systolic                   149               160  ICA peak systolic                   131               93  ICA end diastolic                   43                58  PLAQUE MORPHOLOGY:                  Mixed             Mixed  PLAQUE AMOUNT:                      Mild              Mild  PLAQUE LOCATION:                    ICA / ECA / CCA   ICA / ECA /  bifurcation   IMPRESSION:  1. 40%-59% stenosis of the right internal carotid artery.  2. 1%-39% stenosis of the left internal carotid artery.   ___________________________________________  Janetta Hora Fields, MD   CH/MEDQ  D:  07/18/2009  T:  07/18/2009  Job:  [id number]

## 2010-11-19 NOTE — Cardiovascular Report (Signed)
[name], [name]               ACCOUNT NO.:  [id number]   MEDICAL RECORD NO.:  [id number]          PATIENT TYPE:  OIB   LOCATION:  NA                           FACILITY:  [facility]   PHYSICIAN:  Elmore Guise., M.D.DATE OF BIRTH:  Nov 14, 1932   DATE OF PROCEDURE:  07/16/2007  DATE OF DISCHARGE:                            CARDIAC CATHETERIZATION   INDICATIONS FOR PROCEDURE:  Chest pain, abnormal stress test, patient  with moderate aortic valve stenosis with aortic valve area of 1.2 cm2 by  echo with peak and mean gradients of 28 and 17 mmHg by transthoracic  study.   DESCRIPTION OF PROCEDURE:  The patient was brought to the cardiac cath  lab after appropriate informed consent.  She was prepped and draped in a  sterile fashion.  Approximately 15 mL of 1% lidocaine were used for  local anesthesia.  A 7-French sheath was placed in the right femoral  vein and a 4-French sheath was placed in the right femoral artery  without difficulty.  Right heart catheterization was performed followed  by left heart catheterization, coronary angiogram and LV angiogram.  The  patient tolerated the procedures well, no apparent complications.  She  was transferred from the cardiac cath lab in stable condition.   FINDINGS:  Right heart catheterization measurements.  1. Right atrium:  15 mmHg.  2. RV:  40/8 mmHg.  3. PA:  32/14 mmHg.  4. PCWP 20 mmHg.  5. Cardiac output 3.3 L per minute with cardiac index of 1.9.  Her      aortic measurement with pressure was 175/107 with an LVEF 210/18      and LVEDP of 29 mmHg   Coronaries showed:  1. Left Main:  Mild calcification with mild distal tapering.  2. LAD:  Proximal 70% stenosis followed by mid 60% stenosis and distal      luminal irregularities.  3. D-1:  Small vessel with mild ostial disease.  4. D-2:  Moderate-sized vessel with mild luminal irregularities.  5. Left circumflex:  Nondominant, proximal mild luminal irregularities      with mid 80% to  90% stenosis after branching of a second OM vessel.      Her distal circumflex in the AV groove is small with mild luminal      irregularities.  OM-1:  Small to moderate size with no significant      disease.  OM-2:  Moderate to large vessel with mild luminal      irregularities.  OM-3:  Moderate to large vessel with mild luminal      irregularities.  OM-4 Small vessel with mild luminal regularities.      Faint left-to-right collaterals noted filling the PDA.  6. RCA:  Dominant with 90% to 95% proximal stenosis and tandem 80% to      90% mid and distal stenoses noted.  Her PDA and PLV are patent with      mild luminal irregularities.  7. LV:  EF is 65%.  No wall motion abnormalities.  LVEDP is 29 mmHg.      There was no significant gradient on  pullback; however, LV measured      210/18 and AO 175/107, showing a pressure difference of      approximately 35 mmHg.   IMPRESSION:  1. Obstructive multivessel disease.  2. Borderline high pulmonary pressures.  3. Normal left ventricular systolic function with an ejection fraction      of 65%.  4. History of moderate aortic valve stenosis with recent      echocardiogram showing aortic valve area of 1.2 cm2, peak and mean      gradients of 28 and 17 mmHg.  She does have approximately a 35-mmHg      pressure difference; however, on pullback, gradient was less than      10.  The patient will be scheduled for transesophageal      echocardiogram for further evaluation of her aortic valve disease      prior to referral for coronary artery bypass grafting and aortic      valve replacement.   PLAN:  1. At this time, I would recommend continuing her current medical      therapy.  I will call her ophthalmologist and find what her risk of      ophthalmologic complications regarding for anticoagulation are;      however, the patient will need to be scheduled for a      transesophageal echocardiogram for better evaluation of her aortic      valve  disease and scheduled for surgical referral for coronary      artery bypass grafting and aortic valve replacement.      Elmore Guise., M.D.  Electronically Signed     TWK/MEDQ  D:  07/16/2007  T:  07/16/2007  Job:  [id number]

## 2010-11-19 NOTE — Discharge Summary (Signed)
[name], Peterson NO.:  [id number]   MEDICAL RECORD NO.:  [id number]          PATIENT TYPE:  INP   LOCATION:  2018                         FACILITY:  [facility]   PHYSICIAN:  Salvatore Decent. Dorris Fetch, M.D.DATE OF BIRTH:  1932/07/29   DATE OF ADMISSION:  07/28/2007  DATE OF DISCHARGE:                               DISCHARGE SUMMARY   FINAL DIAGNOSES:  Severe three-vessel coronary artery disease with  moderate aortic stenosis.   IN-HOSPITAL DIAGNOSES:  1. Postoperative atrial fibrillation.  2. Postoperative left pleural effusion.  3. Volume overload postoperatively.   SECONDARY DIAGNOSES:  1. Hypertension.  2. Hyperlipidemia,   OPERATIONS AND PROCEDURES:  1. Intraoperative transesophageal echocardiogram.  2. Coronary artery bypass grafting x4 using a left internal mammary      artery to left anterior descending artery, saphenous vein graft to      right coronary, sequential saphenous vein graft to obtuse marginal      #1 and #2, aortic valve replacement with a 21-mm mitral flow      pericardial prosthesis with septal myomectomy.  3. Endoscopic vein harvest from right leg.  4. Left thoracentesis.   THE PATIENT'S HISTORY AND PHYSICAL AND HOSPITAL COURSE:  [name] is  a 77 year old female who had recent increase in neck and arm pain.  She  has a history of cervical spine disease but noted that this pain was  different in character.  She had a positive stress test.  By  echocardiogram and catheterization, her aortic valve area was between 1  and 1.2 cm2.  She has severe three-vessel coronary artery disease with  the catheterization.  Following these studies, the patient was advised  to undergo coronary artery bypass grafting as well as aortic valve  replacement.  She was seen and evaluated by Dr. Dorris Fetch.  Dr.  Dorris Fetch discussed with the patient risks and benefits of undergoing  these procedures.  The patient acknowledged her understanding and  agreed  to proceed.  Surgery was scheduled for July 28, 2007.  For details of  the patient's past medical history and physical exam, please see  dictated H&P.   The patient was taken to the operating room July 28, 2007, where she  underwent coronary bypass grafting x4 using a left internal mammary  artery to left anterior descending artery, saphenous vein graft to right  coronary, sequential saphenous vein graft to obtuse marginal #1 and #2,  aortic valve replacement with a 21-mm mitral flow pericardial  prosthesis, and septal myomectomy.  She had endoscopic vein harvest from  the right leg.  The patient tolerated this procedure well and was  transferred to the intensive care unit in stable condition.   Postoperatively. the patient was noted to be hemodynamically stable.  She was extubated evening of surgery.  Post extubation, the patient was  alert and oriented x4.  Neurologically intact.  She was placed on nasal  cannula post extubation at 2 liters with saturation greater than 90%.  The patient did obtain a chest x-ray following surgery postop day #1  showing to be stable.  She had minimal drainage from  chest tubes, and  chest tubes discontinued in normal fashion.  Repeat chest x-ray done  postop day #2 showed left lower lobe atelectasis with some left pleural  effusion.  Repeat chest x-ray ordered following day.  This showed a  moderate left pleural effusion.  The patient also noted to be  symptomatic from this with increasing dyspnea on exertion.  Her nasal  cannula had increased to 3 liters with saturation only at 90%.  The  patient had been started on diuretics for volume overload.  After  evaluation and chest x-ray, Dr. Laneta Simmers felt that the patient would  benefit from a left thoracentesis.  Risks and benefits were discussed  with the patient.  The patient acknowledged  understanding and agreed to  proceed.   The left thoracentesis was done July 31, 2007, by Dr. Laneta Simmers;  825 mL  of dark bloody fluid was removed without difficulty.  The patient  tolerated this.  Followup x-ray showed left lower lobe atelectasis and  consolidation.  She was continued on a large dose of diuretics.  Unfortunately, this consolidation and atelectasis persisted on chest x-  ray, and steroid taper dose was started on postop day #5.  Plan is to  continue the steroid taper dose as well as large-dose diuretics and  reevaluate with a repeat chest x-ray in 7-10 days.  This will be planned  to be done as an outpatient.  The patient was increasingly using her  incentive spirometer.  She was able to be weaned off oxygen, saturating  greater than 90% on room air at rest as well as with ambulation.   Postoperatively, the patient was noted to be in normal sinus rhythm.  She was able to be weaned from all drips.  Heart rate and blood pressure  remained stable.  She was started on low-dose beta blocker.  On early  morning of postop day #4,  the patient went into rapid atrial  fibrillation with heart rate 150-170s.  She was started on IV  amiodarone.  After starting IV amiodarone, the patient became lethargic  and felt extremely tired.  Vital signs were repeated. Heart rate had  decreased to 120s but still in atrial fibrillation.  At this time blood  pressure was noted to be 69/45, and Dr. Laneta Simmers was contacted.  Order for  albumin given.  Repeat blood pressure checked following albumin had  improved to 147/65.  The patient remained in atrial fibrillation.   By the afternoon postop day #4, the patient had some converted back to  normal sinus rhythm.  She remained on IV amiodarone.  By postop day #5,  she remained in normal sinus rhythm. IV amiodarone was discontinued, and  she was started on p.o. amiodarone.  The patient was also started on  p.o. digoxin.  Heart rate was monitored and remained in normal sinus  rhythm.  Blood pressure was also followed during this time, and she was  noted to  be hypertensive.  Toprol was increased.  The patient, prior to  discharge, was noted to be in sinus rhythm with stable blood pressure.   Postoperatively, the patient did develop acute blood loss anemia.  Hemoglobin on postop day #1 dropped to 7.6.  The patient received 1 unit  of packed red blood cells.  This was reevaluated in the morning, and  decreased approximately a hemoglobin 8.6,  hematocrit 25.4.  The patient  was monitored, and hemoglobin and hematocrit were followed.  This  remained stable during the remainder of  the postoperative course.  The  patient did not require any further transfusions.  By postop day #6,  hemoglobin was 11.6, hematocrit  34%.   The patient did have slight volume overload postoperatively and had been  started on diuretics for the volume overload as well for the pleural  effusion.  Daily weights were obtained.  The patient was diuresing well  with the Lasix and was back to her preoperative weight prior to  discharge home.  Postoperatively, the patient did develop leukocytosis  with a white count bumping up to 19.8.  She remained afebrile with no  signs of infection.  Urinalysis was checked and was negative.  Following  day, the patient's white count was back near normal and 14.1postop day  #5.  Unfortunately, the following day reevaluated and increased further  to 17.2 postop day #6.  The patient had been started on p.o. steroids  the previous day and was felt secondary to steroid dosing.  This was  monitored.  The patient was working with cardiac rehab daily.  For she  was slow to progress.  Prior to discharge home, the patient was  ambulating well with minimal assistance.  She was also tolerating diet  well.  No nausea, vomiting noted.   Postop day #6, the patient's vital signs were stable.  She was afebrile.  She was saturating greater than 90% on room air.  Weight was 72.8 kg  which was 0.8 kg above her preoperative weight.  Labs postop day #6   showed a white count of 17.2, hemoglobin of 11.6, hematocrit 34.0,  platelet count 233.  The previous day, the patient had a BMP which  showed sodium of 136, potassium 3.6, chloride of 97, bicarb of 28, BUN  of 25, creatinine 1.03, glucose 105.  The patient was noted to be in  normal sinus rhythm.  Pulmonary status was stable with persistent  diminished breath sounds left base.  All incisions were clean, dry and  intact and healing well.   The patient is tentatively ready for discharge home over the next 24-48  hours pending she remains stable.   FOLLOWUP APPOINTMENTS:  Followup appointment arranged with Dr.  Dorris Fetch for August 20, 2007, at 11:45 a.m.  The patient will need  to obtain PA and lateral chest x-ray 30 minutes prior to this  appointment.  She will need to follow up with Dr. Reyes Ivan in 2 weeks.  She will need to contact Dr. Silva Bandy office to make these arrangements.   ACTIVITY:  Patient instructed no driving until released to do so, no  lifting over 10 pounds.  She is told to ambulate 3-4 times per day,  progress as tolerated, and to continue her breathing exercises.   INCISION:  The patient is instructed to shower, washing her incisions  using soap and water.  She is to contact the office if she develops any  drainage or opening from any of her incision sites.   DISCHARGE MEDICATIONS:  1. Aspirin 325 mg daily.  2. Toprol XL 50 mg daily.  3. Crestor 20 mg daily.  4. Niferex 150 mg daily.  5. Lasix 80 mg b.i.d. x1 week, then 80 mg daily.  6. Potassium chloride 40 mEq b.i.d. x1 week, then 40 mEq daily.  7. Digoxin 0.25 mg daily.  8. Amiodarone 400 mg b.i.d.  x14 days, then 200 mg b.i.d.  9. Ultram 50 mg 1-2 tablets q. 4-6 h p.r.n.      Theda Belfast, PA  Salvatore Decent Dorris Fetch, M.D.  Electronically Signed    KMD/MEDQ  D:  08/03/2007  T:  08/03/2007  Job:  [id number]   cc:   Elmore Guise., M.D.

## 2010-11-19 NOTE — Assessment & Plan Note (Signed)
OFFICE VISIT   [name], [name]  DOB:  1932-10-17                                       01/31/2009  ZOXWR#:[id number]   The patient is a 77 year old female referred for carotid stenosis with a  history of some dizziness.  She states that she gets dizzy while sitting  in the chair sometimes or sometimes if she bends over or stands up  quickly.  She denies any symptoms of TIA, amaurosis or stroke.  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  She also has a history  of some back pain and numbness in her left leg which is followed by Dr.  Jordan Likes.   Atherosclerotic risk factors include hypertension and elevated  cholesterol.  She denies a smoking history.  Coronary artery disease  also.   PAST MEDICAL HISTORY:  Otherwise unremarkable.   PAST SURGICAL HISTORY:  Multiple eye operations, coronary artery bypass  grafting in 2009, cholecystectomy and hysterectomy.   MEDICATIONS:  1. Simvastatin 40 mg once a day.  2. Amlodipine 50 mg once a day.  3. Benazepril HCl 20 mg once a day.  4. Gabapentin 300 mg once a day.  5. Ambien 10 mg once a day.  6. Caltrate once a day.  7. Aspirin 81 mg once a day.  8. Eye drops including Omnipred 2 drops once a day, Isopto-Hyoscine 2      drops once a day, timolol maleate 4 drops once a day.   ALLERGIES:  She has no known drug allergies.   FAMILY HISTORY:  Is remarkable for two brothers who had vascular disease  at age 66-78.   SOCIAL HISTORY:  She is married, has three children and is a nonsmoker,  nonconsumer of alcohol.   REVIEW OF SYSTEMS:  She is 5 feet 2 inches, 167 pounds.  NEUROLOGIC:  She has some occasional dizziness as mentioned above.  Cardiac, pulmonary, GI, renal, vascular, orthopedic, psychiatric, ENT  and hematologic review of systems are all negative.   PHYSICAL EXAM:  Vital signs:  Blood pressure is 130/78 in the right arm,  144/80 in the left arm, pulse is 78 and regular.  HEENT:   Unremarkable.  Neck:  Has 2+ carotid pulses.  No bruits appreciated.  Chest:  Clear to  auscultation.  Cardiac:  Exam is regular rate and rhythm without murmur.  She has 2+ carotid pulses bilaterally.  Abdomen:  Soft, nontender,  nondistended.  No masses.  Extremities:  She has 2+ radial, 1+ femoral  and 1+ dorsalis pedis pulses bilaterally.  Neurological:  Shows  symmetric upper extremity and lower extremity motor strength which is  5/5 and symmetric.   She had a carotid duplex exam on 12/01/2008 at [facility]  which showed a 40-60% stenosis bilaterally using our velocity criteria.  By velocity criteria there the right side was 60-80% but more towards  the 60% side.  She had bilateral vertebral flow which was antegrade.   In summary, I believe that most likely the patient has bilateral 60%  carotid stenosis.  She is currently asymptomatic.  I believe her  dizziness is more related to posture rather than carotid stenosis.  I  believe the best option for her would be a repeat carotid duplex exam in  six months' time as well as continued risk factor modification.  In  addition, since she does have a family history of abdominal aortic  aneurysm in her brother I believe she should have an ultrasound of aorta  at her next visit to rule out abdominal aortic aneurysm as well.   Janetta Hora. Fields, MD  Electronically Signed   CEF/MEDQ  D:  01/31/2009  T:  02/01/2009  Job:  [id number]   cc:   Tarri Fuller

## 2010-11-19 NOTE — Procedures (Signed)
DUPLEX ULTRASOUND OF ABDOMINAL AORTA   INDICATION:  Family history of abdominal aortic aneurysm.   HISTORY:  Diabetes:  No  Cardiac:  Open heart surgery in 2009  Hypertension:  Yes  Smoking:  No  Family History:  Brother had AAA.  Previous Surgery:  No   DUPLEX EXAM:         AP (cm)                   TRANSVERSE (cm)  Proximal             2.5 cm                    2.1 cm  Mid                  2.2 cm                    2.1 cm  Distal               1.5 cm                    1.5 cm  Right Iliac          1.2 cm                    1.1 cm  Left Iliac           1.2 cm                    1.1 cm   PREVIOUS:  Date:  AP:  TRANSVERSE:   IMPRESSION:  No evidence of an abdominal aortic aneurysm noted.   ___________________________________________  Janetta Hora Fields, MD   CH/MEDQ  D:  07/18/2009  T:  07/18/2009  Job:  [id number]

## 2010-11-19 NOTE — Op Note (Signed)
NAMEDARL, BRISBIN NO.:  [id number]   MEDICAL RECORD NO.:  [id number]          PATIENT TYPE:  INP   LOCATION:  2302                         FACILITY:  [facility]   PHYSICIAN:  Salvatore Decent. Dorris Fetch, M.D.DATE OF BIRTH:  [birth year]   DATE OF PROCEDURE:  07/28/2007  DATE OF DISCHARGE:                               OPERATIVE REPORT   PREOPERATIVE DIAGNOSIS:  Severe three-vessel coronary disease with  moderate aortic stenosis.   POSTOPERATIVE DIAGNOSIS:  Severe three-vessel coronary disease with  moderate aortic stenosis, plus subaortic outflow tract obstruction.   PROCEDURE:  Median sternotomy, extracorporeal circulation, coronary  bypass grafting x4 (left internal mammary artery to left anterior  descending artery, saphenous vein graft to right coronary, sequential  saphenous vein graft obtuse marginal 1 and 2, aortic valve replacement  with 21-mm Mitroflow pericardial prosthesis, septal myomectomy,  endoscopic vein harvest, right leg.   SURGEON:  Salvatore Decent. Dorris Fetch, M.D.   ASSISTANT:  Kerin Perna, M.D.   SECOND ASSISTANT:  Coral Ceo, P.A.   ANESTHESIA:  General.   FINDINGS:  Prebypass transesophageal echocardiography revealed good left  ventricular function, septal hypertrophy with some dynamic outflow tract  obstruction, tricuspid calcified aortic stenosis.  Postbypass  transesophageal echocardiography revealed good function of the  prosthetic valve with improved gradient across left ventricular outflow  tract.  Aortic valve was tricuspid, calcific and stenotic.  There was  some mild annular calcification, a penetrating ulcer in the ascending  aorta just above the annulus, good-quality targets, and good quality  conduits.   CLINICAL NOTE:  Ms. Killingsworth is a 77 year old woman has had a recent  increase in neck and arm pain.  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  She had a  positive  stress test.  By echo and catheterization, her aortic valve  area was between 1 and 1.2 cm2.  She had severe three-vessel coronary  disease at catheterization.  She was advised to undergo coronary bypass  grafting as well as aortic valve replacement at the time of surgery.  The indications, risks, benefits and alternatives of valve prosthesis  options and the possibility of need for anticoagulation were discussed  in detail with the patient.  She understood, accepted the risks, and  agreed to proceed.   OPERATIVE NOTE:  Ms. Wiegand was brought to the preoperative holding  area on July 28, 2007.  There, the anesthesia service placed lines  for monitoring arterial central venous and pulmonary arterial pressure.  Intravenous antibiotics were administered.  The patient was taken to the  operating room, anesthetized, and intubated.  Transesophageal  echocardiography was performed by Dr. Sharee Holster.  Please see his  notes for further details and confirmed moderate aortic stenosis but  also demonstrated left ventricular outflow tract gradient secondary to  septal hypertrophy.  There was good left ventricular function.  There  was no significant mitral valve insufficiency.  The chest, abdomen, and  legs were then prepped and draped in the usual fashion.  Incision was  made in the medial aspect of the  right leg at the level of knee.  The  greater saphenous vein was harvested from upper calf to the groin.  It  was of excellent quality.  The median sternotomy was performed.  There  was sternal osteoporosis.  The left internal mammary artery was  harvested using standard technique.  Five thousand units of heparin was  administered during the vessel harvest, and the remaining full heparin  dose was given prior to opening the pericardium.   The pericardium was opened.  The ascending aorta was inspected.  It was  enlarged about 3.5 cm in diameter.  The aorta was cannulated via  concentric two  Ethibond pledgeted pursestring sutures.  A dual stage  venous cannula was placed via pursestring suture in the right atrial  appendage.  Cardiopulmonary bypass was instituted.  Then, the patient  was cooled to 28 degrees Celsius.  The coronary arteries were inspected,  and anastomotic sites were chosen.  The conduits were inspected and cut  to length.  A left ventricular vent was placed via pursestring suture in  the right superior pulmonary vein.  A retrograde cardioplegic cannula  placed via pursestring suture in the right atrium and directed in the  coronary sinus.  An antegrade cardioplegic cannula was placed in the  ascending aorta.   The aorta was crossclamped.  The left ventricle was emptied via aortic  root vent.  Cardiac arrest was then achieved with a combination of cold  antegrade and retrograde blood cardioplegia and topical iced saline.  Initial 500 mL of cardioplegia was administered antegrade.  There was a  rapid diastolic arrest and good cooling, and an additional 250 mL was  administered via the retrograde cannula.  The myocardial septal  temperature fell to less than 10 degrees Celsius.  The following distal  anastomoses were performed.   First, a reversed saphenous vein graft was placed end-to-side to the  distal right coronary which was a 1.5-mm good-quality target.  The vein  graft was good quality.  The anastomosis was performed with a running 7-  0 Prolene suture.  There was excellent flow through the graft.  Cardioplegia was administered.  A small leak was repaired with a single  7-0 Prolene suture.   Next, reversed saphenous vein graft was placed sequentially to obtuse  marginals 1 and 2.  These were both 1.5-mm good-quality targets.  The  vein graft was anastomosed side-to-side to obtuse marginal 1 and end-to-  side to obtuse marginal 2.  Both were performed with running 7-0 Prolene  sutures.  There was good flow through the vein graft and good   hemostasis.   Next, the left internal mammary artery was brought through a window in  the pericardium.  The distal end was beveled and was anastomosed end-to-  side to distal LAD.  The distal LAD was a 1.5-mm vessel.  There was  posterior plaquing within the vessel.  It was a satisfactory target  vessel.  Mammary was anastomosed end-to-side with a running 8-0 Prolene  suture.  At completion of the mammary to LAD anastomosis, the bulldog  clamp was briefly removed to inspect for hemostasis.  Immediate and  rapid septal rewarming was noted.  The bulldog clamps were replaced.  Additional cardioplegia was administered via the retrograde cannula.   Aortotomy was performed.  The aortic valve was inspected.  Of note,  there was a small ulcerated atherosclerotic plaque between the annulus  in the left main ostium.  This was oversewn with a 4-0  Prolene suture.  The valve leaflets were calcific and stenotic.  It was a tricuspid  valve.  The valve leaflets were excised.  There was mild annular  calcification which was debrided.  Care was taken to remove all loose  debris, and the annulus was copiously irrigated with iced saline.  Inspection of the septum confirmed the findings on the transesophageal  echocardiogram.  The decision was made to resect a small portion of the  septum approximately 1 cm wide and 0.5 cm in depth to open up the  outflow tract.  This tissue was sent for pathology.  The annulus sized  for a 21-mm Mitroflow aortic valve pericardial prosthesis.  Two-0  Ethibond annular sutures were placed with a horizontal mattress fashion  with subannular pledgets circumferentially around the annulus.  Fifteen  sutures were utilized.  Of note, additional cardioplegia was  administered via the retrograde cannula at 20-minute intervals during  the valve portion of the procedure.  The sutures were passed through the  sewing ring of the valve.  The valve was lowered into place, and sutures  were  sequentially tied and periodically inspected to make sure that the  prosthesis was well seated on the annulus.  The coronary ostia were  inspected and were not impinged.  The annulus was probed with a fine tip  right angle, and there was no gaps noted.  Rewarming was begun.  The  aortotomy was closed in two layers; the first was a running 4-0 Prolene  horizontal mattress suture followed by running 4-0 Prolene simple  suture.   The vein grafts were then cut to length.  The cardioplegia cannula was  removed from the ascending aorta, and the proximal vein graft  anastomoses were performed to 4-mm punch aortotomies with running 6-0  Prolene sutures.  At completion of the final proximal anastomoses, the  patient was placed in Trendelenburg position.  De-airing maneuvers were  performed.  A warm dose of cardioplegia was administered via the  retrograde cannula.  After complete de-airing, the aortic crossclamp was  removed.  Total crossclamp time was 104 minutes.   All proximal and distal anastomoses in the aortotomy were inspected for  hemostasis.  At this point, it was noted that the position of the  proximal anastomosis on the aorta was too low, resulting in kinking of  the graft.  The graft was divided.  The portion attached to the aorta  was oversewn with a 6-0 Prolene suture.  Using the Heartstring device, a  new proximal anastomosis was constructed for the right coronary graft  with a running 6-0 Prolene suture.  The patient required a single  defibrillation and was thereafter in a bradycardic rhythm.  Epicardial  pacer wires were placed on the right ventricle and right atrium.  The  patient reached a core temperature of 37 degrees Celsius.  He was weaned  from cardiopulmonary bypass on the first attempt without difficulty.  Total bypass time was 167 minutes.  Postbypass transesophageal  echocardiography revealed preserved left jugular function, although  there was mild septal  dyskinesis with pacing.  There was no residual  air.  There was good function of the prosthetic valve.   The initial cardiac index was greater than 2 liters per minute per meter  squared, and the patient remained hemodynamically stable throughout post  bypass.  A test dose of protamine was administered and was well  tolerated.  The atrial and aortic cannulae were removed.  The remainder  of the  protamine was administered without incident.  Chest was irrigated  with 1 liter of normal saline containing 1 gram of vancomycin.  Hemostasis was achieved.  Left pleural and  two mediastinal chest tubes  were placed through separate subcostal incisions.  The pericardium was  reapproximated with interrupted 3-0 silk sutures.  It came together  easily without tension.  Sternum was closed with a combination of single  and double heavy gauge stainless steel wires.  The pectoralis fascia,  subcutaneous tissue and skin were closed in standard fashion.  All  sponge, needle and sponge counts were correct at the end of the  procedure.  The patient was taken from the operating room to the  surgical intensive care unit in good condition.      Salvatore Decent Dorris Fetch, M.D.  Electronically Signed     SCH/MEDQ  D:  07/28/2007  T:  07/28/2007  Job:  [id number]   cc:   Elmore Guise., M.D.  Richard Derek Mound A. Pool, M.D.

## 2010-11-19 NOTE — Assessment & Plan Note (Signed)
OFFICE VISIT   [name], [name]  DOB:  [birth year]                                        August 20, 2007  CHART #:  [id number]   The patient is a 77 year old woman who had aortic valve replacement with  a Mitroflow valve and coronary bypass grafting x4 on January the 21st.  Her postoperative course was complicated by some early postoperative  bleeding.  She developed a left pleural effusion and became anemic.  Dr.  Laneta Simmers actually had to do a thoracentesis on her which drained about 850  cc of blood.  She did not have any further bleeding, but did have some  persistent left pleural effusion.  She also had some postoperative  atrial fibrillation, but that resolved before she was discharged.  She  was not anticoagulated given the bleeding problems and the fact that she  had a pericardial valve.  The patient states that the first couple of  weeks were very rough for her.  She was having a lot of coughing.  She  has had difficulty sleeping.  She has not had a lot of pain.  In fact,  she has not had to take any pain medication since she left the hospital.  She has noted over the past week that she is starting to get a little  more energy and starting to see some improvement, although she is still  having difficulty sleeping.  She has had a cough.  She has been taking  over-the-counter cough medication for that, and it has helped some, but  the cough is worse when she lies down.   PHYSICAL EXAMINATION:  The patient is a 77 year old female in no acute  distress.  Blood pressure is 150/90, pulse 77, and respirations are 18.  Oxygen saturation is 97% on room air.  Lungs had diminished breath  sounds at the left base, otherwise clear.  There are no rales or  wheezing.  Her cardiac exam has a regular rate and rhythm, normal S1 and  S2.  There is no murmur.  Abdomen is soft.  Extremities are without  clubbing or cyanosis.  She does have trace to 1+ edema in the  lower  extremities.  Incisions are clean, dry, and intact.  Her sternum is  stable.   Chest x-rays show a decreased but still present left pleural effusion,  and there may be slight elevation of the left hemidiaphragm as well.   IMPRESSION:  The patient is making progress.  She is only about 3 weeks  out from surgery and she is already starting to notice some improvement.  I did caution her that it was going to be sometime in the next, probably  additional 3 weeks, before she really feels like she is starting to make  rapid progress.  She does have this left pleural effusion.  I offered  her the option of thoracentesis versus another trial of steroids and  diuretics.  She opted to try the steroid taper and diuretics once again.  We will plan to see her back in about 3 weeks with a repeat chest x-ray.  I did give her a prescription for Ambien 10 mg p.o. nightly for sleep.  I told her she also could try a Tylenol PM instead of that if that does  not work for her.  The  5 mg Ambien has been ineffective.  I did give her  1 refill on the Ambien.  I encouraged her to continue to increase her  activities, but is still not lifting objects that weigh greater than 10  pounds.  Her husband is found to have a lung nodule, and Dr. Edwyna Shell is  scheduled to do a video-assisted operation on him in early March.  She  should have recovered by that time to be fairly independent.  Again, I  will plan to see her back again in 3 weeks.   Salvatore Decent Dorris Fetch, M.D.  Electronically Signed   SCH/MEDQ  D:  08/20/2007  T:  08/21/2007  Job:  [id number]   cc:   Elmore Guise., M.D.  Richard Derek Mound A. Pool, M.D.

## 2010-11-22 NOTE — Op Note (Signed)
   NAME:  [name], [name]                         ACCOUNT NO.:  [id number]   MEDICAL RECORD NO.:  [id number]                   PATIENT TYPE:  AMB   LOCATION:  DSC                                  FACILITY:  [facility]   PHYSICIAN:  Sharlet Salina T. Hoxworth, M.D.          DATE OF BIRTH:  [birth year]   DATE OF PROCEDURE:  06/17/2002  DATE OF DISCHARGE:                                 OPERATIVE REPORT   PREOPERATIVE DIAGNOSIS:  Subcutaneous mass of the mass.   POSTOPERATIVE DIAGNOSIS:  Subcutaneous mass of the mass.   OPERATION PERFORMED:  Excision of the subcutaneous mass, back.   SURGEON:  Lorne Skeens. Hoxworth, M.D.   ANESTHESIA:  Local with IV sedation.   INDICATIONS FOR PROCEDURE:  The patient is a 77 year old white female who  presents with a painful subcutaneous lump over the left upper back.  Examination reveals a 3 x 2 cm fleshy subcutaneous mass that is tender and  consistent with a probable lipoma.  Due to symptoms, excision has been  recommended and accepted.  The nature of the procedure, its indications and  risks of bleeding and infection were discussed and understood  preoperatively.  She is now brought to the operating room for this  procedure.   DESCRIPTION OF PROCEDURE:  The patient was brought to the operating room and  placed in supine position on the operating table and IV sedation was  administered.  She was carefully positioned in the right lateral decubitus  position and the back sterilely prepped and draped.  Local anesthesia was  used to infiltrate the skin and underlying soft tissue at the site of the  mass.  A transverse incision was made about  3 cm in length and dissection carried down to through the subcutaneous  tissue and Scarpa's fascia.  Beneath Scarpa's fascia lying on the latissimus  fascia was a fatty tissue mass that was quite adherent to the underlying  fascia and surrounding subcutaneous tissue.  It was sharply dissected free  and then up off of the  fascia and completely excised.  Hemostasis was  obtained with the cautery and one vascular pedicle sutured with 3-0 Vicryl.  After obtaining complete hemostasis, the subcu was closed with interrupted 3-  0 Vicryl and the skin with running subcuticular 4-0 Monocryl and Steri-  Strips.  Sponge, needle and instrument counts were correct.  A dry sterile  dressing was applied and the patient taken to recovery in good condition.                                                Lorne Skeens. Hoxworth, M.D.    Tory Emerald  D:  06/17/2002  T:  06/18/2002  Job:  [id number]

## 2010-11-22 NOTE — Op Note (Signed)
NAME:  [name], [name]                         ACCOUNT NO.:  [id number]   MEDICAL RECORD NO.:  [id number]                   PATIENT TYPE:  OIB   LOCATION:  3007                                 FACILITY:  [facility]   PHYSICIAN:  Kathaleen Maser. Peterson, M.D.                 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   POSTOPERATIVE DIAGNOSIS:  C5-6 pseudoarthrosis with chronic neck pain.   PROCEDURES:  1. C5-6 posterior cervical fusion with lateral mass plate instrumentation     and iliac crest autografting with interspinous wiring.  2. Right iliac crest bone harvest.   SURGEON:  Kathaleen Maser. Peterson, M.D.   ASSISTANT:  Donalee Citrin, M.D.   ANESTHESIA:  General endotracheal.   INDICATIONS:  The patient is a 77 year old female with history of previous  C5-6 anterior cervical diskectomy and fusion with allograft and plating.  The patient had late onset of severe neck pain with bilateral shoulder  discomfort failing all conservative management.  These symptoms have  progressed over time.  Workup has demonstrated nonunion at C5-6.  The  patient presents now for posterior cervical fusion for hopeful improvement  in her symptoms.  She has no radicular symptoms.  She has no complaints  consistent with myelopathy.   DESCRIPTION OF PROCEDURE:  The patient was taken to the operating room and  placed on the operating table in supine position.  After an adequate level  of anesthesia was achieved, the patient was positioned prone onto bolsters  with her head fixed in a neutral head position using [place] pin head rest.  The patient's posterior cervical region was prepped and draped sterilely, as  was her right iliac region.  A 10 blade was used to make a linear skin  incision overlying the C4, C5, and C6 levels.  This was carried down sharply  in the  midline.  A subperiosteal dissection was then performed bilaterally,  exposing the laminae and facet joints of C5 and C6.  A deep self-retaining  retractor was placed, x-ray was taken, and the level was confirmed.  A  second incision was made overlying the right iliac crest.  This was carried  down sharply to the soft tissues and fascia, down to the level of the iliac  crest itself.  A 3 cm piece of tricortical ilium was then resected using  osteotomes.  Bleeding edge of the bone was then waxed, the wound was  irrigated, then closed in layers with Vicryl sutures.  The iliac crest graft  was split for use in later autografting.  Attention was placed back to C5-6.  The midpoint of each facet joint complex at C5 and C6 was identified.  An  entry point was made 1 mm inferiorly and 1 mm  medial to the medial point.  A  pilot hole was then drilled using a hand drill and drilled down at  approximately 20 degrees cephalad and 20 degrees lateral from the pilot  hole.  This was drilled to a depth of 12 mm.  The hole was probed, found to  be solidly within bone.  Each hole was then tapped.  Cancellous vertex 12 x  33.5 mm screws were then placed bilaterally at C5 and C6.  A short segment  of titanium rod was then cut and contoured and placed over the screw heads  at C5 and C6.  Locking caps were then placed.  The construct was compressed  and given a final tightening.  Holes were then made into the spinous  processes of C5 and C6.  A single Atlas titanium cable was then passed  through the split tricortical ilium graft with one piece on each side of the  spinous process of C5 and C6 and attached through the holes of the spinous  processes.  Prior to complete attachment, the laminae and spinous processes  were decorticated using the high-speed drill.  The cabling system was given  a final tightening and the head was crimped.  The surplus cable was cut and  discarded.  The wound was then irrigated with  antibiotic solution.  The  final x-rays revealed good position of the bone grafts and hardware, proper  operative level, with normal alignment of the spine.  The wound was  irrigated one final time, then closed in typical fashion using Vicryl  sutures in layers.  Side-to-side and sterile dressing were applied.  There  were no complications.  The patient tolerated the procedure well, and she  returns to the recovery room postop.                                                [name], M.D.    HAP/MEDQ  D:  09/14/2002  T:  09/15/2002  Job:  [id number]

## 2010-11-22 NOTE — Op Note (Signed)
[place]  Patient:    Angela, Peterson                      MRN: [id number] Proc. Date: 11/27/00 Adm. 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  PROCEDURE:  C5-6 anterior cervical diskectomy and fusion with allograft and anterior plate instrumentation.  SURGEON:  Julio Sicks, M.D.  ASSISTANT:  Reinaldo Meeker, M.D.  ANESTHESIA:  General endotracheal.  INDICATIONS:  The patient is a 77 year old female with a history of neck and left upper extremity pain, paresthesias, and weakness, with a left-sided C6 radiculopathy, which has failed efforts for management.  MRI scanning demonstrates left-sided C5-6 disk herniation with associated spondylosis causing compression on the left-sided C6 nerve root.  The patient has failed conservative management.  She has decided to proceed with a C5-6 anterior cervical diskectomy and fusion with allograft and anterior plating for hopeful relief of her symptoms.  DESCRIPTION OF PROCEDURE:  Patient taken to the operating room, placed on the operating table in a supine position.  After an adequate level of anesthesia was achieved, patient positioned supine with her neck slightly extended, held in place with Holter traction.  The patients anterior cervical region is shaved and prepped sterilely.  A 10 blade is used to make a linear skin incision overlying the C5-6 interspace.  This was carried down sharply to the platysma.  The platysma was then divided vertically, and dissection proceeds along the medial border of the sternocleidomastoid muscle and carotid sheath . Trachea and esophagus are mobilized toward the left.  Prevertebral fascia is stripped off the anterior spinal column.  Longus colli muscle is then elevated bilaterally.  A deep self-retaining retractor  was placed.  Intraoperative fluoroscopy was used, and the C5-6 level was confirmed.  Disk space was then incised with a 15 blade in rectangular fashion.  A wide disk space cleanout was then achieved using pituitary rongeurs, forward and backward-angled Karlin curettes, Kerrison rongeurs, and the high-speed drill.  All elements of the disk were removed down to the level of the posterior annulus.  The microscope was brought in the field and used for the remainder of the diskectomy.  The remaining aspect of the annulus and osteophytes removed down to the level of the posterior longitudinal ligament using the high-speed drill.  Posterior longitudinal ligament was then elevated and resected in piecemeal fashion using Kerrison rongeurs.  The underlying thecal sac was identified.  A wide central decompression then performed using Kerrison rongeurs.  Decompression then proceeded out to the left-sided C6 foramen.  The C6 nerve root was identified proximally and then widely decompressed throughout the course of the foramen.  All spondylitic compression and elements of the disk herniation were completely removed.  The C6 nerve root was visualized throughout the entire course of the foramen and well-decompressed.  Decompression then proceeded out into the proximal aspect of the right-sided C6 foramen.  Once again the C6 nerve root was identified, found to be free of any compression. The wound was then copiously irrigated with antibiotic solution, and Gelfoam was placed topically for hemostasis, which was found to be good.  The disk space was then distracted, and a 6 mm fibular wedge allograft was impacted  into place, recessed approximately 1 mm from the anterior cortical surface. Microscope was removed.  A 23 mm Atlantis anterior cervical plate was then placed under fluoroscopic guidance at the C5 and C6 levels.  This was then placed by drilling pilot holes, tapping the pilot holes, and then  subsequently placing two 13 mm cancellous, fixed-angle screws at C5 and two screws at C6. All screws given a final tightening and found to be solidly within bone. Locking screws are engaged.  Retractor system is removed.  Hemostasis in the muscle achieved with electrocautery.  Final images reveal good position of the bone graft and hardware, proper operative level, with normal alignment of the spine.  The wound was irrigated one final time, then closed in a routine fashion.  There were no apparent complications.  The patient tolerated the procedure well, and she returns to the recovery room postoperatively. DD:  11/27/00 TD:  11/27/00 Job: [id number]

## 2010-11-22 NOTE — Op Note (Signed)
[place]  Patient:    [name], [name]                      MRN: [id number] Proc. Date: 11/11/99 Adm. Date:  [id number] Disc. Date: [id number] Attending:  Donn Pierini                           Operative Report  ATTENDING PHYSICIAN:  Julio Sicks, M.D.  SERVICE:  Neurosurgery.  PREOPERATIVE DIAGNOSIS:  Left L3-4 stenosis with radiculopathy.  POSTOPERATIVE DIAGNOSIS:  Left L3-4 stenosis with radiculopathy.  PROCEDURE:  Left L3-4 decompressive laminotomy with left L3-4 microdiskectomy and foraminotomy.  SURGEON:  Julio Sicks, M.D.  ASSISTANT:  Reinaldo Meeker, M.D.  ANESTHESIA:  General endotracheal.  INDICATIONS:  [name] is a 77 year old female who has a past history of a right-sided L4-5 laminotomy and microdiskectomy.  The patient presents now with a left lower extremity pain consistent with a left-sided L5 radiculopathy, which has failed efforts at conservative management.  MRI scanning demonstrates lateral recessed stenosis off to the left side at L4-5 with a broad-based disk bulge further causing stenosis.  The patient has been counseled as to her options.  She has decided to proceed with a left-sided, L4-5 decompressive laminotomy with microdiskectomy.  DESCRIPTION OF PROCEDURE:  The patient was brought to the operating room and placed on the operating table in the supine position.  After an adequate level of anesthesia had been achieved, the patient was positioned prone onto a Wilson frame and appropriately padded.  The patients lumbar region was shaved and prepped sterilely.  A #10 blade was used to make a linear skin incision overlying the L4-5 interspace.  This was carried down sharply in the midline. A subperiosteal dissection was performed on the left side exposing the lamina and facet joints of L4 and L5.  A deep self-retaining retractor was placed, an x-ray was taken, and the level was confirmed.  A laminotomy was  then performed using a high speed drill and Kerrison rongeurs to remove the inferior one third of the lamina of L4 and the medial one third of the L4-5 facet joint, and the superior rim of the L5 lamina.  The ligamentum flavum was then elevated and resected in piecemeal fashion using Kerrison rongeurs.  The underlying thecal sac and exiting L5 nerve root were identified.  The microscope was brought into the field and used for microdissection of left-sided L5 nerve root.  The nerve root was gradually mobilized and retracted towards the midline.  The remaining aspects of bony stenosis were removed using Kerrison rongeurs.  The disk space was isolated and found to be moderately herniated at the disk space level.  It was then incised with a #15 blade in a rectangular fashion.  A wide disk space clean out was achieved using pituitary rongeurs, upward angled pituitary rongeurs, and Epstein curettes.  All loose or obviously degenerative disk material was removed from the inner space.  After a very thorough diskectomy had been performed, the spinal canal was once again inspected.  There was no evidence of any continued compression.  The wound was then copiously irrigated with antibiotic solution. Gelfoam was then placed topically for hemostasis, which was found to be good. The microscope and the retraction system were removed.  Hemostasis of the muscles were achieved with electrocautery.  The wound was then closed in layers of Vicryl sutures.  Staples were applied  to the surface.  There were no apparent complications.  The patient tolerated the procedure well, and she returned to the recovery room postoperatively. DD:  11/11/99 TD:  11/12/99 Job: [id number]

## 2010-11-26 ENCOUNTER — Telehealth: Payer: Self-pay | Admitting: Nurse Practitioner

## 2010-11-26 NOTE — Telephone Encounter (Signed)
Pt wanted to know about appt on Friday. Does she need blood work. She wanted to know if she could get at primary Dr's office Please call

## 2010-11-27 ENCOUNTER — Other Ambulatory Visit: Payer: Self-pay | Admitting: *Deleted

## 2010-11-27 DIAGNOSIS — I10 Essential (primary) hypertension: Secondary | ICD-10-CM

## 2010-11-29 ENCOUNTER — Ambulatory Visit (INDEPENDENT_AMBULATORY_CARE_PROVIDER_SITE_OTHER): Payer: PRIVATE HEALTH INSURANCE | Admitting: Nurse Practitioner

## 2010-11-29 ENCOUNTER — Encounter: Payer: Self-pay | Admitting: Nurse Practitioner

## 2010-11-29 ENCOUNTER — Other Ambulatory Visit (INDEPENDENT_AMBULATORY_CARE_PROVIDER_SITE_OTHER): Payer: PRIVATE HEALTH INSURANCE | Admitting: *Deleted

## 2010-11-29 VITALS — BP 140/70 | HR 64 | Wt 162.0 lb

## 2010-11-29 DIAGNOSIS — Z954 Presence of other heart-valve replacement: Secondary | ICD-10-CM

## 2010-11-29 DIAGNOSIS — E785 Hyperlipidemia, unspecified: Secondary | ICD-10-CM

## 2010-11-29 DIAGNOSIS — Z953 Presence of xenogenic heart valve: Secondary | ICD-10-CM

## 2010-11-29 DIAGNOSIS — I2510 Atherosclerotic heart disease of native coronary artery without angina pectoris: Secondary | ICD-10-CM

## 2010-11-29 DIAGNOSIS — I10 Essential (primary) hypertension: Secondary | ICD-10-CM

## 2010-11-29 MED ORDER — BENAZEPRIL HCL 20 MG PO TABS: 20.0000 mg | ORAL_TABLET | Freq: Every day | ORAL | Status: DC

## 2010-11-29 MED ORDER — ATORVASTATIN CALCIUM 20 MG PO TABS: 20.0000 mg | ORAL_TABLET | Freq: Every day | ORAL | Status: DC

## 2010-11-29 NOTE — Assessment & Plan Note (Signed)
She is taking both the Lipitor and the Zocor. I have left her on Lipitor 20 mg only.

## 2010-11-29 NOTE — Patient Instructions (Addendum)
Go by this list of medicines. Do not take any more simvastatin I will see you in one month Continue to monitor your blood pressure at home.

## 2010-11-29 NOTE — Assessment & Plan Note (Signed)
We called the drug store to help clarify some of her medicines. She has not had her Norvasc filled since December. She is encouraged to take the HCTZ every day. She will try taking the Lisinopril at night. She may need the Norvasc added back. I don't think her dizziness is because of low blood pressures. Her readings from home are barely marginal. I will see her back in one month. She did not go the lab today. We will check a BMET on return. Patient is agreeable to this plan and will call if any problems develop in the interim.

## 2010-11-29 NOTE — Progress Notes (Signed)
[name] Date of Birth: [birth year]   History of Present Illness: [name] is seen back today for a follow up visit. She is seen for Dr. Deborah Chalk. He added HCTZ to her regimen at her visit last month. Her medicines are all mixed up. She doesn't think she is on Norvasc but is not sure. She has been taking the HCTZ, but not every day. She says she was told that her eye drops had blood pressure medicine in them and that that would take care of her blood pressure. Her readings from home are reviewed and she has only marginal control. She continues to complain of dizzy spells. Her dizziness seems more related to position changes. She does take all of her medicines at one time in the morning. She has had no chest pain. Her last echo was in April of 2011. Her valve was ok. She has not had follow up stress testing.   Current Outpatient Prescriptions on File Prior to Visit  Medication Sig Dispense Refill  . aspirin 81 MG tablet Take 81 mg by mouth daily.        . Brimonidine Tartrate-Timolol (COMBIGAN OP) Apply to eye 2 (two) times daily. RIGHT EYE       . Calcium Carbonate (CALTRATE 600 PO) Take by mouth daily.        Marland Kitchen gabapentin (NEURONTIN) 300 MG capsule Take 300 mg by mouth daily.        Marland Kitchen HOMATROPINE HBR OP Apply to eye 2 (two) times daily. Right eye       . prednisoLONE acetate (PRED FORTE) 1 % ophthalmic suspension Place 1 drop into the right eye 2 (two) times daily.        . Vitamin D, Ergocalciferol, (DRISDOL) 50000 UNITS CAPS Take 50,000 Units by mouth every 7 (seven) days.        Marland Kitchen zolpidem (AMBIEN) 5 MG tablet Take 5 mg by mouth at bedtime. 10MG  HS        . DISCONTD: atorvastatin (LIPITOR) 10 MG tablet Take 2 tablets (20 mg total) by mouth daily.      Marland Kitchen DISCONTD: benazepril (LOTENSIN) 20 MG tablet Take 20 mg by mouth daily.       Marland Kitchen DISCONTD: amLODipine (NORVASC) 5 MG tablet Take 5 mg by mouth daily.          Allergies  Allergen Reactions  . Zetia (Ezetimibe) Other (See Comments)   DIZZINESS    Past Medical History  Diagnosis Date  . S/P aortic valve replacement with bioprosthetic valve   . Dyslipidemia   . Hypertension   . Spinal arthritis   . CAD (coronary artery disease)   . Hx of CABG   . Blindness of right eye     due to retinal bleed  . Glaucoma   . Left bundle branch block     Past Surgical History  Procedure Date  . Coronary artery bypass graft Jan 2009    LIMA to LAD, SVG to RCA, SVG to OM 1 & 2  . Aortic valve replacement Jan 2009    #21 mm pericardial prosthesis  . Back surgery   . Neck surgery   . Abdominal hysterectomy   . Cholecystectomy     History  Smoking status  . Never Smoker   Smokeless tobacco  . Never Used    History  Alcohol Use No    Family History  Problem Relation Age of Onset  . Cancer Mother   . Heart attack  Father   . Heart attack Brother     Review of Systems: The review of systems is positive for dizziness. Sounds like it is more positional in nature. No frank syncope. No chest pain or shortness of breath.  All other systems were reviewed and are negative.  Physical Exam: BP 140/70  Pulse 64  Wt 162 lb (73.483 kg) Patient is very pleasant and in no acute distress. Skin is warm and dry. Color is normal.  HEENT is unremarkable except for right eye abnormality. Normocephalic/atraumatic. Sclera are nonicteric. Neck is supple. No masses. No JVD. Lungs are clear. Cardiac exam shows a regular rate and rhythm. Valve is crisp. She does have an outflow murmur noted. Abdomen is soft. Extremities are without edema. Gait and ROM are intact. No gross neurologic deficits noted.  LABORATORY DATA:   Assessment / Plan:

## 2010-12-27 ENCOUNTER — Ambulatory Visit (INDEPENDENT_AMBULATORY_CARE_PROVIDER_SITE_OTHER): Payer: PRIVATE HEALTH INSURANCE | Admitting: Nurse Practitioner

## 2010-12-27 ENCOUNTER — Other Ambulatory Visit (INDEPENDENT_AMBULATORY_CARE_PROVIDER_SITE_OTHER): Payer: PRIVATE HEALTH INSURANCE | Admitting: *Deleted

## 2010-12-27 ENCOUNTER — Telehealth: Payer: Self-pay | Admitting: *Deleted

## 2010-12-27 ENCOUNTER — Encounter: Payer: Self-pay | Admitting: Nurse Practitioner

## 2010-12-27 VITALS — BP 148/86 | HR 60 | Ht 62.0 in | Wt 162.0 lb

## 2010-12-27 DIAGNOSIS — I10 Essential (primary) hypertension: Secondary | ICD-10-CM

## 2010-12-27 DIAGNOSIS — E785 Hyperlipidemia, unspecified: Secondary | ICD-10-CM

## 2010-12-27 LAB — BASIC METABOLIC PANEL
BUN: 22 mg/dL (ref 6–23)
CO2: 27 mEq/L (ref 19–32)
Calcium: 9.9 mg/dL (ref 8.4–10.5)
Chloride: 105 mEq/L (ref 96–112)
Creatinine, Ser: 1.2 mg/dL (ref 0.4–1.2)
GFR: 48.75 mL/min — ABNORMAL LOW (ref >60.00)
Glucose, Bld: 95 mg/dL (ref 70–99)
Potassium: 4.3 mEq/L (ref 3.5–5.1)
Sodium: 139 mEq/L (ref 135–145)

## 2010-12-27 LAB — LIPID PANEL
Cholesterol: 184 mg/dL (ref 0–200)
HDL: 34.3 mg/dL — ABNORMAL LOW (ref >39.00)
LDL Cholesterol: 114 mg/dL — ABNORMAL HIGH (ref 0–99)
Total CHOL/HDL Ratio: 5
Triglycerides: 179 mg/dL — ABNORMAL HIGH (ref 0.0–149.0)
VLDL: 35.8 mg/dL (ref 0.0–40.0)

## 2010-12-27 LAB — HEPATIC FUNCTION PANEL
ALT: 18 U/L (ref 0–35)
AST: 21 U/L (ref 0–37)
Albumin: 4.3 g/dL (ref 3.5–5.2)
Alkaline Phosphatase: 75 U/L (ref 39–117)
Bilirubin, Direct: 0.2 mg/dL (ref 0.0–0.3)
Total Bilirubin: 1.4 mg/dL — ABNORMAL HIGH (ref 0.3–1.2)
Total Protein: 6.9 g/dL (ref 6.0–8.3)

## 2010-12-27 NOTE — Telephone Encounter (Signed)
Message copied by Adolphus Birchwood on Fri Dec 27, 2010  5:05 PM ------      Message from: Rosalio Macadamia      Created: Fri Dec 27, 2010  3:15 PM       Cholesterol levels look better with the Lipitor. Recheck in 6 months.

## 2010-12-27 NOTE — Progress Notes (Signed)
Addended by: Royanne Foots on: 12/27/2010 08:39 AM   Modules accepted: Orders

## 2010-12-27 NOTE — Progress Notes (Signed)
[name] Date of Birth: 1933-05-24   History of Present Illness: [name] is seen back today for her one month visit. She is seen for Dr. Antoine Poche. She is a former patient of Dr. Ronnald Nian. She is now on her HCTZ. Blood pressure at home looks good. She feels ok. She is no longer dizzy. No chest pain. She has been busy canning green beans.   Current Outpatient Prescriptions on File Prior to Visit  Medication Sig Dispense Refill  . aspirin 81 MG tablet Take 81 mg by mouth daily.        Marland Kitchen atorvastatin (LIPITOR) 20 MG tablet Take 1 tablet (20 mg total) by mouth daily.  30 tablet  11  . benazepril (LOTENSIN) 20 MG tablet Take 1 tablet (20 mg total) by mouth at bedtime.  30 tablet  11  . Brimonidine Tartrate-Timolol (COMBIGAN OP) Apply to eye 2 (two) times daily. RIGHT EYE       . Calcium Carbonate (CALTRATE 600 PO) Take by mouth daily.        Marland Kitchen gabapentin (NEURONTIN) 300 MG capsule Take 300 mg by mouth daily.        Marland Kitchen HOMATROPINE HBR OP Apply to eye 2 (two) times daily. Right eye       . hydrochlorothiazide 25 MG tablet Take 25 mg by mouth daily.        . prednisoLONE acetate (PRED FORTE) 1 % ophthalmic suspension Place 1 drop into the right eye 2 (two) times daily.        . Vitamin D, Ergocalciferol, (DRISDOL) 50000 UNITS CAPS Take 50,000 Units by mouth every 7 (seven) days.        Marland Kitchen zolpidem (AMBIEN) 5 MG tablet Take 5 mg by mouth at bedtime. 10MG  HS          Allergies  Allergen Reactions  . Zetia (Ezetimibe) Other (See Comments)    DIZZINESS    Past Medical History  Diagnosis Date  . S/P aortic valve replacement with bioprosthetic valve   . Dyslipidemia   . Hypertension   . Spinal arthritis   . CAD (coronary artery disease)   . Hx of CABG   . Blindness of right eye     due to retinal bleed  . Glaucoma   . Left bundle branch block     Past Surgical History  Procedure Date  . Coronary artery bypass graft Jan 2009    LIMA to LAD, SVG to RCA, SVG to OM 1 & 2  . Aortic  valve replacement Jan 2009    #21 mm pericardial prosthesis  . Back surgery   . Neck surgery   . Abdominal hysterectomy   . Cholecystectomy     History  Smoking status  . Never Smoker   Smokeless tobacco  . Never Used    History  Alcohol Use No    Family History  Problem Relation Age of Onset  . Cancer Mother   . Heart attack Father   . Heart attack Brother     Review of Systems: The review of systems is positive for as above. She is now just on Lipitor for her cholesterol. All other systems were reviewed and are negative.  Physical Exam: BP 148/86  Pulse 60  Ht 5\' 2"  (1.575 m)  Wt 162 lb (73.483 kg)  BMI 29.63 kg/m2 Patient is very pleasant and in no acute distress. Skin is warm and dry. Color is normal.  HEENT is unremarkable. Normocephalic/atraumatic. PERRL. Sclera  are nonicteric. Neck is supple. No masses. No JVD. Lungs are clear. Cardiac exam shows a regular rate and rhythm. Valve is crisp. She has a very soft outflow murmur. Abdomen is soft. Extremities are without edema. Gait and ROM are intact. No gross neurologic deficits noted.  LABORATORY DATA: BMET is pending   Assessment / Plan:

## 2010-12-27 NOTE — Telephone Encounter (Signed)
Pt notified of lab results and to continue same medications.  Pt will have labs in six months.

## 2010-12-27 NOTE — Patient Instructions (Addendum)
  We will see you back in about 5 months. You will see Dr. Angelina Sheriff at your next visit. Continue with your current medicines. Monitor your blood pressure at home.  Record your readings and bring to your next visit. Limit sodium intake. Call for any problems.

## 2010-12-27 NOTE — Assessment & Plan Note (Signed)
She is doing well. Will need to consider repeat echo on return.

## 2010-12-27 NOTE — Assessment & Plan Note (Signed)
Blood pressure looks much better at home. She feels good and is tolerating her current regimen. We will see her back in about 5 months. BMET is checked today. She is to continue to monitor at home. Patient is agreeable to this plan and will call if any problems develop in the interim.

## 2010-12-27 NOTE — Assessment & Plan Note (Signed)
Doing well with her current regimen.

## 2010-12-30 ENCOUNTER — Encounter: Payer: Self-pay | Admitting: Cardiology

## 2010-12-30 DIAGNOSIS — R079 Chest pain, unspecified: Secondary | ICD-10-CM

## 2011-03-27 LAB — MAGNESIUM
Magnesium: 2.7 — ABNORMAL HIGH
Magnesium: 2.9 — ABNORMAL HIGH
Magnesium: 3 — ABNORMAL HIGH

## 2011-03-27 LAB — BASIC METABOLIC PANEL
BUN: 15
BUN: 25 — ABNORMAL HIGH
BUN: 29 — ABNORMAL HIGH
CO2: 25
CO2: 25
CO2: 26
CO2: 28
Calcium: 8.7
Calcium: 8.9
Calcium: 9
Calcium: 9.3
Chloride: 107
Creatinine, Ser: 0.82
Creatinine, Ser: 1.03
Creatinine, Ser: 1.09
GFR calc Af Amer: 60 — ABNORMAL LOW
GFR calc Af Amer: >60
GFR calc Af Amer: >60
GFR calc Af Amer: >60
GFR calc non Af Amer: 49 — ABNORMAL LOW
GFR calc non Af Amer: 53 — ABNORMAL LOW
GFR calc non Af Amer: >60
GFR calc non Af Amer: >60
Glucose, Bld: 105 — ABNORMAL HIGH
Glucose, Bld: 117 — ABNORMAL HIGH
Glucose, Bld: 171 — ABNORMAL HIGH
Potassium: 4.3
Potassium: 4.5
Potassium: 4.7
Sodium: 133 — ABNORMAL LOW
Sodium: 136
Sodium: 139
Sodium: 139
Sodium: 140
Sodium: 141

## 2011-03-27 LAB — POCT I-STAT 3, ART BLOOD GAS (G3+)
Acid-base deficit: 2
Bicarbonate: 25.5 — ABNORMAL HIGH
O2 Saturation: 100
O2 Saturation: 100
O2 Saturation: 99
Operator id: 299391
Operator id: 300131
Patient temperature: 35.9
Patient temperature: 37
Patient temperature: 37.1
TCO2: 22
TCO2: 27
TCO2: 28
pCO2 arterial: 39.1
pCO2 arterial: 40
pCO2 arterial: 44.9
pH, Arterial: 7.369
pH, Arterial: 7.403 — ABNORMAL HIGH
pO2, Arterial: 387 — ABNORMAL HIGH

## 2011-03-27 LAB — CBC
HCT: 25.4 — ABNORMAL LOW
HCT: 26.3 — ABNORMAL LOW
HCT: 37.1
Hemoglobin: 10.3 — ABNORMAL LOW
Hemoglobin: 7.6 — CL
Hemoglobin: 8.6 — ABNORMAL LOW
Hemoglobin: 8.8 — ABNORMAL LOW
Hemoglobin: 8.9 — ABNORMAL LOW
Hemoglobin: 9.1 — ABNORMAL LOW
MCHC: 33.4
MCHC: 33.7
MCHC: 33.9
MCHC: 34
MCHC: 34.1
MCHC: 34.1
MCV: 87
MCV: 87
MCV: 89.1
MCV: 89.8
Platelets: 144 — ABNORMAL LOW
Platelets: 240
Platelets: 92 — ABNORMAL LOW
RBC: 2.58 — ABNORMAL LOW
RBC: 2.84 — ABNORMAL LOW
RBC: 2.86 — ABNORMAL LOW
RBC: 3.13 — ABNORMAL LOW
RBC: 3.81 — ABNORMAL LOW
RBC: 3.85 — ABNORMAL LOW
RDW: 14.9
RDW: 15.4
RDW: 15.4
RDW: 15.5
RDW: 15.8 — ABNORMAL HIGH
WBC: 11.4 — ABNORMAL HIGH
WBC: 13.2 — ABNORMAL HIGH
WBC: 13.2 — ABNORMAL HIGH
WBC: 21.8 — ABNORMAL HIGH

## 2011-03-27 LAB — POCT I-STAT 4, (NA,K, GLUC, HGB,HCT)
Glucose, Bld: 127 — ABNORMAL HIGH
Glucose, Bld: 130 — ABNORMAL HIGH
Glucose, Bld: 158 — ABNORMAL HIGH
HCT: 20 — ABNORMAL LOW
HCT: 26 — ABNORMAL LOW
HCT: 28 — ABNORMAL LOW
HCT: 33 — ABNORMAL LOW
Hemoglobin: 11.2 — ABNORMAL LOW
Hemoglobin: 12.6
Hemoglobin: 8.8 — ABNORMAL LOW
Operator id: 299391
Operator id: 3342
Operator id: 3342
Operator id: 3342
Potassium: 4.4
Sodium: 138
Sodium: 139
Sodium: 140
Sodium: 144

## 2011-03-27 LAB — COMPREHENSIVE METABOLIC PANEL
Albumin: 4.5
Alkaline Phosphatase: 56
BUN: 19
Creatinine, Ser: 1
Potassium: 4.3
Total Protein: 7.6

## 2011-03-27 LAB — I-STAT EC8
Acid-base deficit: 1
BUN: 17
Chloride: 108
HCT: 26 — ABNORMAL LOW
Hemoglobin: 8.8 — ABNORMAL LOW
Operator id: 199821
Potassium: 5.5 — ABNORMAL HIGH
Sodium: 138
pCO2 arterial: 33.7 — ABNORMAL LOW

## 2011-03-27 LAB — POCT I-STAT 3, VENOUS BLOOD GAS (G3P V)
Acid-base deficit: 7 — ABNORMAL HIGH
Bicarbonate: 19.8 — ABNORMAL LOW
TCO2: 21

## 2011-03-27 LAB — TYPE AND SCREEN

## 2011-03-27 LAB — BLOOD GAS, ARTERIAL
Drawn by: 274481
FIO2: 0.21
Patient temperature: 98.6
TCO2: 22.8
pH, Arterial: 7.419 — ABNORMAL HIGH

## 2011-03-27 LAB — URINALYSIS, ROUTINE W REFLEX MICROSCOPIC
Bilirubin Urine: NEGATIVE
Hgb urine dipstick: NEGATIVE
Ketones, ur: NEGATIVE
Protein, ur: NEGATIVE
Urobilinogen, UA: 1

## 2011-03-27 LAB — HEMOGLOBIN A1C
Hgb A1c MFr Bld: 6
Mean Plasma Glucose: 136

## 2011-03-27 LAB — PROTIME-INR: Prothrombin Time: 16 — ABNORMAL HIGH

## 2011-03-27 LAB — CREATININE, SERUM
Creatinine, Ser: 0.81
GFR calc non Af Amer: >60

## 2011-03-27 LAB — HEMOGLOBIN AND HEMATOCRIT, BLOOD
HCT: 25.9 — ABNORMAL LOW
Hemoglobin: 8.7 — ABNORMAL LOW

## 2011-04-01 LAB — BASIC METABOLIC PANEL
CO2: 20
Chloride: 113 — ABNORMAL HIGH
Creatinine, Ser: 1.05
GFR calc Af Amer: >60
Sodium: 139

## 2011-04-01 LAB — CBC
Hemoglobin: 12.9
MCHC: 32.9
MCV: 82.3
RBC: 4.78

## 2011-06-17 ENCOUNTER — Ambulatory Visit: Payer: PRIVATE HEALTH INSURANCE | Admitting: Cardiology

## 2011-06-20 ENCOUNTER — Ambulatory Visit: Payer: PRIVATE HEALTH INSURANCE | Admitting: Cardiology

## 2011-06-23 ENCOUNTER — Encounter: Payer: Self-pay | Admitting: Cardiology

## 2011-06-23 ENCOUNTER — Ambulatory Visit (INDEPENDENT_AMBULATORY_CARE_PROVIDER_SITE_OTHER): Payer: PRIVATE HEALTH INSURANCE | Admitting: Cardiology

## 2011-06-23 DIAGNOSIS — E7800 Pure hypercholesterolemia, unspecified: Secondary | ICD-10-CM

## 2011-06-23 DIAGNOSIS — Z953 Presence of xenogenic heart valve: Secondary | ICD-10-CM

## 2011-06-23 DIAGNOSIS — I10 Essential (primary) hypertension: Secondary | ICD-10-CM

## 2011-06-23 DIAGNOSIS — E785 Hyperlipidemia, unspecified: Secondary | ICD-10-CM

## 2011-06-23 DIAGNOSIS — I359 Nonrheumatic aortic valve disorder, unspecified: Secondary | ICD-10-CM

## 2011-06-23 DIAGNOSIS — R0789 Other chest pain: Secondary | ICD-10-CM

## 2011-06-23 DIAGNOSIS — I2510 Atherosclerotic heart disease of native coronary artery without angina pectoris: Secondary | ICD-10-CM

## 2011-06-23 NOTE — Patient Instructions (Signed)
The current medical regimen is effective;  continue present plan and medications.  Your physician has requested that you have a lexiscan myoview. For further information please visit [email or web address] Please follow instruction sheet, as given.  Please return for a fasting lipid and liver profile on the same day as your stress testing.  Follow up in 6 months with Dr Antoine Poche.  You will receive a letter in the mail 2 months before you are due.  Please call us when you receive this letter to schedule your follow up appointment.

## 2011-06-23 NOTE — Assessment & Plan Note (Signed)
The blood pressure is at target. No change in medications is indicated. We will continue with therapeutic lifestyle changes (TLC).  

## 2011-06-23 NOTE — Assessment & Plan Note (Signed)
Given the history of bypass and the chest discomfort she has she needs stress perfusion imaging. She has an abnormal baseline EKG insertion will have a [employer].

## 2011-06-23 NOTE — Assessment & Plan Note (Signed)
She had an echo in 2011.  No further testing is indicated.

## 2011-06-23 NOTE — Progress Notes (Signed)
HPI  The patient presents as a new patient for me having previously been treated by Dr. Deborah Chalk.  She has a history of bypass and aortic valve replacement. She's done well since this and she does not get any of the left arm discomfort that was her previous angina. She does some household chores but she otherwise doesn't exercise. With her level of activity she denies any chest pressure, neck or arm discomfort. She has no palpitations, presyncope or syncope. She denies any shortness of breath, PND or orthopnea. She's had no weight gain or edema. She has had some mild substernal discomfort sporadically that she thought was reflux. She can't really quantify or qualify this.   Allergies  Allergen Reactions  . Zetia (Ezetimibe) Other (See Comments)    DIZZINESS    Current Outpatient Prescriptions  Medication Sig Dispense Refill  . aspirin 81 MG tablet Take 81 mg by mouth daily.        Marland Kitchen atorvastatin (LIPITOR) 20 MG tablet Take 1 tablet (20 mg total) by mouth daily.  30 tablet  11  . benazepril (LOTENSIN) 20 MG tablet Take 1 tablet (20 mg total) by mouth at bedtime.  30 tablet  11  . Brimonidine Tartrate-Timolol (COMBIGAN OP) Apply to eye 2 (two) times daily. RIGHT EYE       . Calcium Carbonate (CALTRATE 600 PO) Take by mouth daily.        Marland Kitchen gabapentin (NEURONTIN) 300 MG capsule Take 300 mg by mouth daily.        Marland Kitchen HOMATROPINE HBR OP Apply to eye 2 (two) times daily. Right eye       . hydrochlorothiazide 25 MG tablet Take 25 mg by mouth daily.        . prednisoLONE acetate (PRED FORTE) 1 % ophthalmic suspension Place 1 drop into the right eye 2 (two) times daily.        . Vitamin D, Ergocalciferol, (DRISDOL) 50000 UNITS CAPS Take 50,000 Units by mouth every 7 (seven) days.        Marland Kitchen zolpidem (AMBIEN) 5 MG tablet Take 5 mg by mouth at bedtime. 10MG  HS          Past Medical History  Diagnosis Date  . S/P aortic valve replacement with bioprosthetic valve   . Dyslipidemia   . Hypertension   .  Spinal arthritis   . CAD (coronary artery disease)   . Hx of CABG   . Blindness of right eye     due to retinal bleed  . Glaucoma   . Left bundle branch block     Past Surgical History  Procedure Date  . Coronary artery bypass graft Jan 2009    LIMA to LAD, SVG to RCA, SVG to OM 1 & 2  . Aortic valve replacement Jan 2009    #21 mm pericardial prosthesis  . Back surgery   . Neck surgery   . Abdominal hysterectomy   . Cholecystectomy     ROS: As stated in the HPI and negative for all other systems.  PHYSICAL EXAM BP 132/76  Pulse 59  Ht 5\' 2"  (1.575 m)  Wt 159 lb 12.8 oz (72.485 kg)  BMI 29.23 kg/m2 GENERAL:  Well appearing HEENT:  Pupils equal round and reactive, fundi not visualized, oral mucosa unremarkable NECK:  No jugular venous distention, waveform within normal limits, carotid upstroke brisk and symmetric, no bruits, no thyromegaly LYMPHATICS:  No cervical, inguinal adenopathy LUNGS:  Clear to auscultation bilaterally BACK:  No CVA tenderness CHEST:  Well healed sternotomy scar. HEART:  PMI not displaced or sustained,S1 and S2 within normal limits, no S3, no S4, no clicks, no rubs, early peaking systolic murmur radiating out the outflow tract ABD:  Flat, positive bowel sounds normal in frequency in pitch, no bruits, no rebound, no guarding, no midline pulsatile mass, no hepatomegaly, no splenomegaly EXT:  2 plus pulses throughout, no edema, no cyanosis no clubbing SKIN:  No rashes no nodules NEURO:  Cranial nerves II through XII grossly intact, motor grossly intact throughout [facility]:  Cognitively intact, oriented to person place and time  EKG:  Sinus rhythm, left axis deviation, left bundle branch block, rate 50 06/23/2011   ASSESSMENT AND PLAN

## 2011-06-23 NOTE — Assessment & Plan Note (Signed)
I will check a fasting lipid profile the goal LDL less than 100 and HDL greater than 40.

## 2011-07-02 ENCOUNTER — Other Ambulatory Visit (INDEPENDENT_AMBULATORY_CARE_PROVIDER_SITE_OTHER): Payer: PRIVATE HEALTH INSURANCE | Admitting: *Deleted

## 2011-07-02 ENCOUNTER — Ambulatory Visit (HOSPITAL_COMMUNITY): Payer: PRIVATE HEALTH INSURANCE | Attending: Cardiovascular Disease | Admitting: Radiology

## 2011-07-02 ENCOUNTER — Other Ambulatory Visit: Payer: Self-pay | Admitting: Cardiology

## 2011-07-02 DIAGNOSIS — R42 Dizziness and giddiness: Secondary | ICD-10-CM | POA: Insufficient documentation

## 2011-07-02 DIAGNOSIS — R0789 Other chest pain: Secondary | ICD-10-CM | POA: Insufficient documentation

## 2011-07-02 DIAGNOSIS — E785 Hyperlipidemia, unspecified: Secondary | ICD-10-CM | POA: Insufficient documentation

## 2011-07-02 DIAGNOSIS — R079 Chest pain, unspecified: Secondary | ICD-10-CM

## 2011-07-02 DIAGNOSIS — Z951 Presence of aortocoronary bypass graft: Secondary | ICD-10-CM | POA: Insufficient documentation

## 2011-07-02 DIAGNOSIS — R0602 Shortness of breath: Secondary | ICD-10-CM

## 2011-07-02 DIAGNOSIS — R0609 Other forms of dyspnea: Secondary | ICD-10-CM | POA: Insufficient documentation

## 2011-07-02 DIAGNOSIS — Z8249 Family history of ischemic heart disease and other diseases of the circulatory system: Secondary | ICD-10-CM | POA: Insufficient documentation

## 2011-07-02 DIAGNOSIS — I447 Left bundle-branch block, unspecified: Secondary | ICD-10-CM | POA: Insufficient documentation

## 2011-07-02 DIAGNOSIS — E7800 Pure hypercholesterolemia, unspecified: Secondary | ICD-10-CM

## 2011-07-02 DIAGNOSIS — I10 Essential (primary) hypertension: Secondary | ICD-10-CM | POA: Insufficient documentation

## 2011-07-02 DIAGNOSIS — I2510 Atherosclerotic heart disease of native coronary artery without angina pectoris: Secondary | ICD-10-CM

## 2011-07-02 DIAGNOSIS — R0989 Other specified symptoms and signs involving the circulatory and respiratory systems: Secondary | ICD-10-CM | POA: Insufficient documentation

## 2011-07-02 DIAGNOSIS — I779 Disorder of arteries and arterioles, unspecified: Secondary | ICD-10-CM | POA: Insufficient documentation

## 2011-07-02 LAB — LIPID PANEL
Cholesterol: 238 mg/dL — ABNORMAL HIGH (ref 0–200)
Triglycerides: 272 mg/dL — ABNORMAL HIGH (ref 0.0–149.0)

## 2011-07-02 LAB — HEPATIC FUNCTION PANEL
ALT: 14 U/L (ref 0–35)
Total Protein: 7.1 g/dL (ref 6.0–8.3)

## 2011-07-02 LAB — LDL CHOLESTEROL, DIRECT: Direct LDL: 143.6 mg/dL

## 2011-07-02 MED ORDER — TECHNETIUM TC 99M TETROFOSMIN IV KIT
33.0000 | PACK | Freq: Once | INTRAVENOUS | Status: AC | PRN
  Administered 2011-07-02: 33 via INTRAVENOUS

## 2011-07-02 MED ORDER — ADENOSINE (DIAGNOSTIC) 3 MG/ML IV SOLN
0.5600 mg/kg | Freq: Once | INTRAVENOUS | Status: AC
  Administered 2011-07-02: 40.2 mg via INTRAVENOUS

## 2011-07-02 MED ORDER — TECHNETIUM TC 99M TETROFOSMIN IV KIT
11.0000 | PACK | Freq: Once | INTRAVENOUS | Status: AC | PRN
  Administered 2011-07-02: 11 via INTRAVENOUS

## 2011-07-02 NOTE — Progress Notes (Signed)
[facility] SITE 3 NUCLEAR MED [address] [name] [address] [phone]  Cardiology Nuclear Med Study  [name] is a 77 y.o. female [id number] 12/29/1932   Nuclear Med Background Indication for Stress Test:  Evaluation for Ischemia and Graft Patency History: 2009  CABG + AVR,4/11 Echo-EF 55-60% moderate LVH and 2008 Myocardial Perfusion Study- EF 62%, Inferior- lateral defect Cardiac Risk Factors: Carotid Disease, Family History - CAD, Hypertension, LBBB and Lipids  Symptoms:  Chest Pain, Dizziness and DOE   Nuclear Pre-Procedure Caffeine/Decaff Intake:  None NPO After: 430 pm   Lungs:  Clear IV 0.9% NS with Angio Cath:  22g  IV Site: R Hand  IV Started by:  Bonnita Levan, RN  Chest Size (in):  36 Cup Size: C  Height: 5\' 2"  (1.575 m)  Weight:  158 lb (71.668 kg)  BMI:  Body mass index is 28.90 kg/(m^2). Tech Comments:  N/A    Nuclear Med Study 1 or 2 day study: 1 day  Stress Test Type:  Adenosine  Reading MD: Kristeen Miss, MD  Order Authorizing Provider:  [name], MD  Resting Radionuclide: Technetium 24m Tetrofosmin  Resting Radionuclide Dose: 11.0 mCi   Stress Radionuclide:  Technetium 52m Tetrofosmin  Stress Radionuclide Dose: 33.0 mCi           Stress Protocol Rest HR: 61 Stress HR: 80  Rest BP: 133/80 Stress BP: 156/68  Exercise Time (min): n/a METS: n/a   Predicted Max HR: 144 bpm % Max HR: 55.56 bpm Rate Pressure Product: 86578   Dose of Adenosine (mg):  40.2 Dose of Lexiscan: n/a mg  Dose of Atropine (mg): n/a Dose of Dobutamine: n/a mcg/kg/min (at max HR)  Stress Test Technologist: Bonnita Levan, RN  Nuclear Technologist:  Domenic Polite, CNMT     Rest Procedure:  Myocardial perfusion imaging was performed at rest 45 minutes following the intravenous administration of Technetium 26m Tetrofosmin. Rest ECG: NSR-LBBB  Stress Procedure:  The patient received IV adenosine at 140 mcg/kg/min for 4 minutes.  There were no  significant changes with infusion,occ. PVC, brief transient AV Block noted.  Technetium 63m Tetrofosmin was injected at the 2 minute mark and quantitative spect images were obtained after a 45 minute delay. Stress ECG: No significant change from baseline ECG  QPS Raw Data Images:  Normal; no motion artifact; normal heart/lung ratio. Stress Images:  Normal homogeneous uptake in all areas of the myocardium. Rest Images:  Normal homogeneous uptake in all areas of the myocardium. Subtraction (SDS):  No evidence of ischemia. Transient Ischemic Dilatation (Normal <1.22): 1.12  Lung/Heart Ratio (Normal <0.45):  0.15  Quantitative Gated Spect Images QGS EDV:  65 ml QGS ESV:  22 ml QGS cine images:  NL LV Function; NL Wall Motion QGS EF: 67%  Impression Exercise Capacity:  Adenosine study with no exercise. BP Response:  Normal blood pressure response. Clinical Symptoms:  No chest pain. ECG Impression:  No significant ST segment change suggestive of ischemia. Comparison with Prior Nuclear Study: No images to compare  Overall Impression:  Normal stress nuclear study.  No evidence of ischemia.  Normal LV function with an EF of 67%.    Vesta Mixer, Montez Hageman., MD, [facility] 07/02/2011, 3:12 PM

## 2011-07-23 MED ORDER — ATORVASTATIN CALCIUM 40 MG PO TABS: 40.0000 mg | ORAL_TABLET | Freq: Every day | ORAL | Status: DC

## 2011-07-23 NOTE — Progress Notes (Signed)
Addended by: Sharin Grave on: 07/23/2011 05:25 PM   Modules accepted: Orders

## 2011-09-12 ENCOUNTER — Other Ambulatory Visit (INDEPENDENT_AMBULATORY_CARE_PROVIDER_SITE_OTHER): Payer: PRIVATE HEALTH INSURANCE

## 2011-09-12 DIAGNOSIS — E7800 Pure hypercholesterolemia, unspecified: Secondary | ICD-10-CM

## 2011-09-12 LAB — HEPATIC FUNCTION PANEL
Alkaline Phosphatase: 82 U/L (ref 39–117)
Bilirubin, Direct: 0 mg/dL (ref 0.0–0.3)

## 2011-09-12 LAB — LIPID PANEL
HDL: 37.5 mg/dL — ABNORMAL LOW (ref >39.00)
Total CHOL/HDL Ratio: 5
VLDL: 47.2 mg/dL — ABNORMAL HIGH (ref 0.0–40.0)

## 2011-09-15 ENCOUNTER — Other Ambulatory Visit: Payer: PRIVATE HEALTH INSURANCE

## 2011-10-08 ENCOUNTER — Other Ambulatory Visit: Payer: Self-pay | Admitting: *Deleted

## 2011-10-08 DIAGNOSIS — E7800 Pure hypercholesterolemia, unspecified: Secondary | ICD-10-CM

## 2011-10-08 MED ORDER — ATORVASTATIN CALCIUM 80 MG PO TABS: 80.0000 mg | ORAL_TABLET | Freq: Every day | ORAL | Status: DC

## 2011-11-28 ENCOUNTER — Ambulatory Visit (INDEPENDENT_AMBULATORY_CARE_PROVIDER_SITE_OTHER): Payer: PRIVATE HEALTH INSURANCE | Admitting: Neurosurgery

## 2011-11-28 ENCOUNTER — Ambulatory Visit (INDEPENDENT_AMBULATORY_CARE_PROVIDER_SITE_OTHER): Payer: PRIVATE HEALTH INSURANCE | Admitting: *Deleted

## 2011-11-28 ENCOUNTER — Encounter: Payer: Self-pay | Admitting: Neurosurgery

## 2011-11-28 VITALS — BP 169/89 | HR 61 | Resp 18 | Ht 62.0 in | Wt 165.0 lb

## 2011-11-28 DIAGNOSIS — I6529 Occlusion and stenosis of unspecified carotid artery: Secondary | ICD-10-CM

## 2011-11-28 DIAGNOSIS — Z48812 Encounter for surgical aftercare following surgery on the circulatory system: Secondary | ICD-10-CM

## 2011-11-28 DIAGNOSIS — I779 Disorder of arteries and arterioles, unspecified: Secondary | ICD-10-CM | POA: Insufficient documentation

## 2011-11-28 NOTE — Progress Notes (Signed)
Addended by: Sharee Pimple on: 11/28/2011 11:42 AM   Modules accepted: Orders

## 2011-11-28 NOTE — Progress Notes (Signed)
[facility] [place] HISTORY AND PHYSICAL   CC: Annual carotid duplex Referring Physician: Fields  History of Present Illness: 78 year old female patient of Dr. Darrick Penna followed for carotid stenosis with serial duplex. The patient denies any signs or symptoms of CVA, TIA, amaurosis fugax or any neural deficit. She denies any new medical diagnoses or any recent surgery. Patient is legally blind in her right eye due to glaucoma.  Past Medical History  Diagnosis Date  . S/P aortic valve replacement with bioprosthetic valve   . Dyslipidemia   . Hypertension   . Spinal arthritis   . CAD (coronary artery disease)   . Hx of CABG   . Blindness of right eye     due to retinal bleed  . Glaucoma   . Left bundle branch block     ROS: [x]  Positive   [ ]  Denies    General: [ ]  Weight loss, [ ]  Fever, [ ]  chills Neurologic: [ ]  Dizziness, [ ]  Blackouts, [ ]  Seizure [ ]  Stroke, [ ]  "Mini stroke", [ ]  Slurred speech, [ ]  Temporary blindness; [ ]  weakness in arms or legs, [ ]  Hoarseness Cardiac: [ ]  Chest pain/pressure, [ ]  Shortness of breath at rest [ ]  Shortness of breath with exertion, [ ]  Atrial fibrillation or irregular heartbeat Vascular: [ ]  Pain in legs with walking, [ ]  Pain in legs at rest, [ ]  Pain in legs at night,  [ ]  Non-healing ulcer, [ ]  Blood clot in vein/DVT,   Pulmonary: [ ]  Home oxygen, [ ]  Productive cough, [ ]  Coughing up blood, [ ]  Asthma,  [ ]  Wheezing Musculoskeletal:  [ ]  Arthritis, [ ]  Low back pain, [ ]  Joint pain Hematologic: [ ]  Easy Bruising, [ ]  Anemia; [ ]  Hepatitis Gastrointestinal: [ ]  Blood in stool, [ ]  Gastroesophageal Reflux/heartburn, [ ]  Trouble swallowing Urinary: [ ]  chronic Kidney disease, [ ]  on HD - [ ]  MWF or [ ]  TTHS, [ ]  Burning with urination, [ ]  Difficulty urinating Skin: [ ]  Rashes, [ ]  Wounds Psychological: [ ]  Anxiety, [ ]  Depression   Social History History  Substance Use Topics  . Smoking status: Never Smoker   .  Smokeless tobacco: Never Used  . Alcohol Use: No    Family History Family History  Problem Relation Age of Onset  . Cancer Mother   . Heart attack Father   . Heart attack Brother     Allergies  Allergen Reactions  . Zetia (Ezetimibe) Other (See Comments)    DIZZINESS    Current Outpatient Prescriptions  Medication Sig Dispense Refill  . aspirin 81 MG tablet Take 81 mg by mouth daily.        Marland Kitchen atorvastatin (LIPITOR) 80 MG tablet Take 1 tablet (80 mg total) by mouth daily.  30 tablet  11  . benazepril (LOTENSIN) 20 MG tablet Take 1 tablet (20 mg total) by mouth at bedtime.  30 tablet  11  . Brimonidine Tartrate-Timolol (COMBIGAN OP) Apply to eye 2 (two) times daily. RIGHT EYE       . Calcium Carbonate (CALTRATE 600 PO) Take by mouth daily.        Marland Kitchen gabapentin (NEURONTIN) 300 MG capsule Take 300 mg by mouth daily.        Marland Kitchen HOMATROPINE HBR OP Apply to eye 2 (two) times daily. Right eye       . hydrochlorothiazide 25 MG tablet Take 25 mg by mouth daily.        Marland Kitchen  prednisoLONE acetate (PRED FORTE) 1 % ophthalmic suspension Place 1 drop into the right eye 2 (two) times daily.        . Vitamin D, Ergocalciferol, (DRISDOL) 50000 UNITS CAPS Take 50,000 Units by mouth every 7 (seven) days.        Marland Kitchen zolpidem (AMBIEN) 5 MG tablet Take 5 mg by mouth at bedtime. 10MG  HS          Physical Examination  Filed Vitals:   11/28/11 1049  BP: 169/89  Pulse: 61  Resp:     Body mass index is 30.18 kg/(m^2).  General:  WDWN in NAD Gait: Normal HEENT: WNL Eyes: Pupils equal Pulmonary: normal non-labored breathing , without Rales, rhonchi,  wheezing Cardiac: RRR, without  Murmurs, rubs or gallops; Abdomen: soft, NT, no masses Skin: no rashes, ulcers noted  Vascular Exam Pulses: 1+ radial pulses bilaterally Carotid bruits: Carotid pulses to auscultation no bruits are heard Extremities without ischemic changes, no Gangrene , no cellulitis; no open wounds;  Musculoskeletal: no muscle  wasting or atrophy   Neurologic: A&O X 3; Appropriate Affect ; SENSATION: normal; MOTOR FUNCTION:  moving all extremities equally. Speech is fluent/normal  Non-Invasive Vascular Imaging CAROTID DUPLEX 11/28/2011  Right ICA 40 - 59 % stenosis Left ICA 0 - 19% stenosis   ASSESSMENT/PLAN: Stable asymptomatic known carotid stenosis right greater than left. Patient will followup in one year with repeat carotid duplex her questions were encouraged and answered. Patient knows signs and symptoms of CVA and noticed report to the nearest emergency room should this occur.  Lauree Chandler ANP   Clinic MD: Imogene Burn

## 2011-12-05 NOTE — Procedures (Unsigned)
CAROTID DUPLEX EXAM  INDICATION:  Carotid stenosis  HISTORY: Diabetes:  No Cardiac:  CABG 2009 Hypertension:  Yes Smoking:  No Previous Surgery: CV History:  Asymptomatic, blind right eye Amaurosis Fugax No, Paresthesias No, Hemiparesis No                                      RIGHT             LEFT Brachial systolic pressure:         158               160 Brachial Doppler waveforms:         Within normal range                 Within normal range Vertebral direction of flow:        Antegrade         Antegrade DUPLEX VELOCITIES (cm/sec) CCA peak systolic                   87                78 ECA peak systolic                   111               132 ICA peak systolic                   145 (curve)       114 ICA end diastolic                   41                38 PLAQUE MORPHOLOGY:                  Mixed             Mixed PLAQUE AMOUNT:                      Mild              Mild to moderate PLAQUE LOCATION:                    ICA, CCA          ICA, bifurcation, CCA  IMPRESSION: 1. 40%-59% internal carotid artery stenosis, internal carotid artery     is somewhat tortuous. 2. No hemodynamically significant left internal carotid artery     stenosis with plaque formation as described above. 3. Vertebral artery flow antegrade bilaterally. 4. No significant change since prior study of 07/18/2010.  ___________________________________________ Janetta Hora. Fields, MD  SS/MEDQ  D:  11/28/2011  T:  11/28/2011  Job:  [id number]

## 2012-01-20 ENCOUNTER — Other Ambulatory Visit: Payer: Self-pay | Admitting: Cardiology

## 2012-01-20 MED ORDER — HYDROCHLOROTHIAZIDE 25 MG PO TABS: 25.0000 mg | ORAL_TABLET | Freq: Every day | ORAL | Status: DC

## 2012-04-08 ENCOUNTER — Encounter: Payer: Self-pay | Admitting: Cardiology

## 2012-04-08 ENCOUNTER — Ambulatory Visit (INDEPENDENT_AMBULATORY_CARE_PROVIDER_SITE_OTHER): Payer: Medicare Other | Admitting: Cardiology

## 2012-04-08 VITALS — BP 140/70 | HR 57 | Ht 61.0 in | Wt 159.4 lb

## 2012-04-08 DIAGNOSIS — I2510 Atherosclerotic heart disease of native coronary artery without angina pectoris: Secondary | ICD-10-CM

## 2012-04-08 DIAGNOSIS — I359 Nonrheumatic aortic valve disorder, unspecified: Secondary | ICD-10-CM

## 2012-04-08 DIAGNOSIS — E785 Hyperlipidemia, unspecified: Secondary | ICD-10-CM

## 2012-04-08 DIAGNOSIS — I10 Essential (primary) hypertension: Secondary | ICD-10-CM

## 2012-04-08 DIAGNOSIS — Z953 Presence of xenogenic heart valve: Secondary | ICD-10-CM

## 2012-04-08 DIAGNOSIS — I6529 Occlusion and stenosis of unspecified carotid artery: Secondary | ICD-10-CM

## 2012-04-08 LAB — LIPID PANEL
Cholesterol: 173 mg/dL (ref 0–200)
LDL Cholesterol: 101 mg/dL — ABNORMAL HIGH (ref 0–99)
Total CHOL/HDL Ratio: 5
VLDL: 40 mg/dL (ref 0.0–40.0)

## 2012-04-08 NOTE — Progress Notes (Signed)
HPI  The patient presents for evaluation of arm pain. She was on a cruise recently and describes some numbness in her fifth fourth third finger on the right hand as well as numbness on her right thigh. She was actually taken to an emergency room in [place]. Today her she apparently also mentions some left arm discomfort. She refused admission however. I did review these emergency room records and one set of enzymes was negative. There were no acute EKG changes from her baseline. She was full the followup here when she got back. Since then she has continued to have this tingling in her hand and leg. With careful questioning she has lots of aches and pains. However, the left arm discomfort that she's getting is not different than it has been previously. She and thinks it probably was going on in December of last year when she had no high risk findings on a stress perfusion study. She does not think it is the same as her previous angina. She was able to do activities such as working in her yard yesterday without necessarily bring this on. She is limited by some back pain. She has no new shortness of breath, PND or orthopnea.   Allergies  Allergen Reactions  . Zetia (Ezetimibe) Other (See Comments)    DIZZINESS    Current Outpatient Prescriptions  Medication Sig Dispense Refill  . aspirin 81 MG tablet Take 81 mg by mouth daily.        Marland Kitchen atorvastatin (LIPITOR) 80 MG tablet Take 1 tablet (80 mg total) by mouth daily.  30 tablet  11  . benazepril (LOTENSIN) 20 MG tablet Take 1 tablet (20 mg total) by mouth at bedtime.  30 tablet  11  . Brimonidine Tartrate-Timolol (COMBIGAN OP) Apply to eye 2 (two) times daily. RIGHT EYE       . Calcium Carbonate (CALTRATE 600 PO) Take by mouth daily.        Marland Kitchen gabapentin (NEURONTIN) 300 MG capsule Take 300 mg by mouth daily.        Marland Kitchen HOMATROPINE HBR OP Apply to eye 2 (two) times daily. Right eye       . hydrochlorothiazide (HYDRODIURIL) 25 MG tablet Take 1 tablet (25 mg  total) by mouth daily.  90 tablet  1  . prednisoLONE acetate (PRED FORTE) 1 % ophthalmic suspension Place 1 drop into the right eye 2 (two) times daily.        . Vitamin D, Ergocalciferol, (DRISDOL) 50000 UNITS CAPS Take 50,000 Units by mouth every 7 (seven) days.        Marland Kitchen zolpidem (AMBIEN) 5 MG tablet Take 5 mg by mouth at bedtime. 10MG  HS          Past Medical History  Diagnosis Date  . S/P aortic valve replacement with bioprosthetic valve   . Dyslipidemia   . Hypertension   . Spinal arthritis   . CAD (coronary artery disease)   . Hx of CABG   . Blindness of right eye     due to retinal bleed  . Glaucoma   . Left bundle branch block     Past Surgical History  Procedure Date  . Coronary artery bypass graft Jan 2009    LIMA to LAD, SVG to RCA, SVG to OM 1 & 2  . Aortic valve replacement Jan 2009    #21 mm pericardial prosthesis  . Back surgery   . Neck surgery   . Abdominal hysterectomy   .  Cholecystectomy     ROS: As stated in the HPI and negative for all other systems.  PHYSICAL EXAM BP 140/70  Pulse 57  Ht 5\' 1"  (1.549 m)  Wt 72.303 kg (159 lb 6.4 oz)  BMI 30.12 kg/m2 GENERAL:  Well appearing HEENT:  Pupils equal round and reactive, fundi not visualized, oral mucosa unremarkable NECK:  No jugular venous distention, waveform within normal limits, carotid upstroke brisk and symmetric, no bruits, no thyromegaly LYMPHATICS:  No cervical, inguinal adenopathy LUNGS:  Clear to auscultation bilaterally BACK:  No CVA tenderness CHEST:  Well healed sternotomy scar. HEART:  PMI not displaced or sustained,S1 and S2 within normal limits, no S3, no S4, no clicks, no rubs, mid peaking systolic murmur radiating out the outflow tract ABD:  Flat, positive bowel sounds normal in frequency in pitch, no bruits, no rebound, no guarding, no midline pulsatile mass, no hepatomegaly, no splenomegaly EXT:  2 plus pulses throughout, no edema, no cyanosis no clubbing SKIN:  No rashes no  nodules NEURO:  Cranial nerves II through XII grossly intact, motor grossly intact throughout [facility]:  Cognitively intact, oriented to person place and time  EKG:  Sinus rhythm, left axis deviation, left bundle branch block, rate 57 04/08/2012   ASSESSMENT AND PLAN  CAD (coronary artery disease) -  After careful questioning, despite multiple different complaints, I do not get the impression that she's having new angina. She had a stress perfusion study last year. I do not think further imaging in particular catheterization is indicated.   Hypertension -  The blood pressure is at target. No change in medications is indicated. We will continue with therapeutic lifestyle changes (TLC).   S/P aortic valve replacement with bioprosthetic valve -  Her murmur sounds somewhat louder today and I will repeat an echocardiogram.   Dyslipidemia -  After the last visit I checked a lipid profile and the LDL was 126. I doubled her statin. I will repeat this today.   Carotid stenosis - She has nonobstructive carotid stenosis followed by  [facility]

## 2012-04-08 NOTE — Patient Instructions (Addendum)
The current medical regimen is effective;  continue present plan and medications.  Please have blood work today before leaving the office. (lipid)  Your physician has requested that you have an echocardiogram. Echocardiography is a painless test that uses sound waves to create images of your heart. It provides your doctor with information about the size and shape of your heart and how well your heart's chambers and valves are working. This procedure takes approximately one hour. There are no restrictions for this procedure.  Follow up in 3 months with Dr Antoine Poche.

## 2012-04-13 ENCOUNTER — Encounter: Payer: Self-pay | Admitting: *Deleted

## 2012-04-14 ENCOUNTER — Ambulatory Visit (HOSPITAL_COMMUNITY): Payer: Medicare Other | Attending: Cardiovascular Disease | Admitting: Radiology

## 2012-04-14 DIAGNOSIS — I359 Nonrheumatic aortic valve disorder, unspecified: Secondary | ICD-10-CM | POA: Insufficient documentation

## 2012-04-14 DIAGNOSIS — I10 Essential (primary) hypertension: Secondary | ICD-10-CM | POA: Insufficient documentation

## 2012-04-14 DIAGNOSIS — Z953 Presence of xenogenic heart valve: Secondary | ICD-10-CM

## 2012-04-14 DIAGNOSIS — I369 Nonrheumatic tricuspid valve disorder, unspecified: Secondary | ICD-10-CM | POA: Insufficient documentation

## 2012-04-14 DIAGNOSIS — I2510 Atherosclerotic heart disease of native coronary artery without angina pectoris: Secondary | ICD-10-CM | POA: Insufficient documentation

## 2012-04-14 NOTE — Progress Notes (Signed)
Echocardiogram performed.  

## 2012-07-07 HISTORY — PX: CARDIAC CATHETERIZATION: SHX172

## 2012-07-13 ENCOUNTER — Encounter: Payer: Self-pay | Admitting: Cardiology

## 2012-07-13 ENCOUNTER — Ambulatory Visit (INDEPENDENT_AMBULATORY_CARE_PROVIDER_SITE_OTHER): Payer: Medicare Other | Admitting: Cardiology

## 2012-07-13 VITALS — BP 165/70 | HR 58 | Ht 61.0 in | Wt 160.8 lb

## 2012-07-13 DIAGNOSIS — Z952 Presence of prosthetic heart valve: Secondary | ICD-10-CM

## 2012-07-13 DIAGNOSIS — I6529 Occlusion and stenosis of unspecified carotid artery: Secondary | ICD-10-CM

## 2012-07-13 DIAGNOSIS — Z953 Presence of xenogenic heart valve: Secondary | ICD-10-CM

## 2012-07-13 DIAGNOSIS — I10 Essential (primary) hypertension: Secondary | ICD-10-CM

## 2012-07-13 DIAGNOSIS — I2510 Atherosclerotic heart disease of native coronary artery without angina pectoris: Secondary | ICD-10-CM

## 2012-07-13 NOTE — Progress Notes (Signed)
HPI     The patient presents for evaluation of prosthetic valve aortic stenosis. I sent her for followup of this after noting that her murmur suggested perhaps some stenosis of her prosthetic valve. In fact her mean gradient was 34. It had been 15 in 2011.  She actually feels better than when I last saw her. She's been active. She occasionally get some chest discomfort but she thinks this is similar to reflux. She's not having anything that she thinks is cardiac. She's not having any new shortness of breath, PND or orthopnea. She's not having any palpitations, presyncope or syncope. She's not having any of the numbness or tingling she described previously.  Allergies  Allergen Reactions  . Zetia (Ezetimibe) Other (See Comments)    DIZZINESS    Current Outpatient Prescriptions  Medication Sig Dispense Refill  . aspirin 81 MG tablet Take 81 mg by mouth daily.        Marland Kitchen atorvastatin (LIPITOR) 80 MG tablet Take 1 tablet (80 mg total) by mouth daily.  30 tablet  11  . benazepril (LOTENSIN) 20 MG tablet Take 1 tablet (20 mg total) by mouth at bedtime.  30 tablet  11  . Brimonidine Tartrate-Timolol (COMBIGAN OP) Apply to eye 2 (two) times daily. RIGHT EYE       . Calcium Carbonate (CALTRATE 600 PO) Take by mouth daily.        Marland Kitchen gabapentin (NEURONTIN) 300 MG capsule Take 300 mg by mouth daily.        Marland Kitchen HOMATROPINE HBR OP Apply to eye 2 (two) times daily. Right eye       . hydrochlorothiazide (HYDRODIURIL) 25 MG tablet Take 1 tablet (25 mg total) by mouth daily.  90 tablet  1  . Vitamin D, Ergocalciferol, (DRISDOL) 50000 UNITS CAPS Take 50,000 Units by mouth every 7 (seven) days.        Marland Kitchen zolpidem (AMBIEN) 5 MG tablet Take 5 mg by mouth at bedtime. 10MG  HS          Past Medical History  Diagnosis Date  . S/P aortic valve replacement with bioprosthetic valve   . Dyslipidemia   . Hypertension   . Spinal arthritis   . CAD (coronary artery disease)   . Hx of CABG   . Blindness of right eye       due to retinal bleed  . Glaucoma(365)   . Left bundle branch block     Past Surgical History  Procedure Date  . Coronary artery bypass graft Jan 2009    LIMA to LAD, SVG to RCA, SVG to OM 1 & 2  . Aortic valve replacement Jan 2009    #21 mm pericardial prosthesis  . Back surgery   . Neck surgery   . Abdominal hysterectomy   . Cholecystectomy     ROS: As stated in the HPI and negative for all other systems.  PHYSICAL EXAM BP 165/70  Pulse 58  Ht 5\' 1"  (1.549 m)  Wt 160 lb 12.8 oz (72.938 kg)  BMI 30.38 kg/m2 GENERAL:  Well appearing HEENT: Right pupil fixed and lens opacified. Dentures.  NECK:  No jugular venous distention, waveform within normal limits, carotid upstroke brisk and symmetric, no bruits, no thyromegaly LYMPHATICS:  No cervical, inguinal adenopathy LUNGS:  Clear to auscultation bilaterally BACK:  No CVA tenderness CHEST:  Well healed sternotomy scar. HEART:  PMI not displaced or sustained,S1 and S2 within normal limits, no S3, no S4, no clicks, no  rubs, mid peaking systolic murmur radiating out the outflow tract ABD:  Flat, positive bowel sounds normal in frequency in pitch, no bruits, no rebound, no guarding, no midline pulsatile mass, no hepatomegaly, no splenomegaly EXT:  2 plus pulses throughout, no edema, no cyanosis no clubbing SKIN:  No rashes no nodules NEURO:  Cranial nerves II through XII grossly intact, motor grossly intact throughout [facility]:  Cognitively intact, oriented to person place and time  EKG:  Sinus rhythm, left axis deviation, left bundle branch block, rate 58 07/13/2012   ASSESSMENT AND PLAN  CAD (coronary artery disease) -  She had a stress perfusion study last year. She has had no new symptoms.  No further cardiac workup is suggested.  Hypertension -  The blood pressure is at target at home.Marland Kitchen No change in medications is indicated. We will continue with therapeutic lifestyle changes (TLC).   S/P aortic valve replacement with  bioprosthetic valve -  Her prosthetic valve seems to have a higher gradient than previous. I will follow this with an echocardiogram in 6 months. She's not having any new overt symptoms related to this.  Dyslipidemia -  She is at a target dose of statin.  Carotid stenosis - She has nonobstructive carotid stenosis followed by  [facility]

## 2012-07-13 NOTE — Patient Instructions (Addendum)
The current medical regimen is effective;  continue present plan and medications.  Your physician has requested that you have an echocardiogram in 6 months. Echocardiography is a painless test that uses sound waves to create images of your heart. It provides your doctor with information about the size and shape of your heart and how well your heart's chambers and valves are working. This procedure takes approximately one hour. There are no restrictions for this procedure.  Follow up in 6 months with Dr Hochrein.  You will receive a letter in the mail 2 months before you are due.  Please call us when you receive this letter to schedule your follow up appointment.  

## 2012-07-28 ENCOUNTER — Telehealth: Payer: Self-pay | Admitting: Cardiology

## 2012-07-28 DIAGNOSIS — E7800 Pure hypercholesterolemia, unspecified: Secondary | ICD-10-CM

## 2012-07-28 MED ORDER — ATORVASTATIN CALCIUM 80 MG PO TABS: 80.0000 mg | ORAL_TABLET | Freq: Every day | ORAL | Status: DC

## 2012-07-28 NOTE — Telephone Encounter (Signed)
Requested Prescriptions     Pending Prescriptions Disp Refills   • atorvastatin (LIPITOR) 80 MG tablet 90 tablet 3     Sig: Take 1 tablet (80 mg total) by mouth daily

## 2012-07-28 NOTE — Telephone Encounter (Signed)
New problem:   Need a 90 days supply - atorvastatin  80 mg.    [facility]

## 2012-10-25 ENCOUNTER — Other Ambulatory Visit: Payer: Self-pay

## 2012-10-25 MED ORDER — HYDROCHLOROTHIAZIDE 25 MG PO TABS: 25.0000 mg | ORAL_TABLET | Freq: Every day | ORAL | Status: DC

## 2012-11-22 ENCOUNTER — Encounter: Payer: Self-pay | Admitting: Physician Assistant

## 2012-11-22 ENCOUNTER — Ambulatory Visit (INDEPENDENT_AMBULATORY_CARE_PROVIDER_SITE_OTHER): Payer: Medicare Other | Admitting: Physician Assistant

## 2012-11-22 VITALS — BP 152/94 | HR 61 | Ht 61.0 in | Wt 163.0 lb

## 2012-11-22 DIAGNOSIS — M79609 Pain in unspecified limb: Secondary | ICD-10-CM

## 2012-11-22 DIAGNOSIS — I2510 Atherosclerotic heart disease of native coronary artery without angina pectoris: Secondary | ICD-10-CM

## 2012-11-22 DIAGNOSIS — I10 Essential (primary) hypertension: Secondary | ICD-10-CM

## 2012-11-22 DIAGNOSIS — I6529 Occlusion and stenosis of unspecified carotid artery: Secondary | ICD-10-CM

## 2012-11-22 DIAGNOSIS — I359 Nonrheumatic aortic valve disorder, unspecified: Secondary | ICD-10-CM

## 2012-11-22 DIAGNOSIS — M79601 Pain in right arm: Secondary | ICD-10-CM

## 2012-11-22 NOTE — Progress Notes (Signed)
[address] [place], [place]  [address] Phone: [phone] Fax:  [phone]  Date:  11/22/2012   ID:  [name], DOB 1933-01-27, MRN [id number]  PCP:  Tarri Fuller, MD  Primary Cardiologist:  Dr. [name]     History of Present Illness: [name] is a 79 y.o. female who returns for evaluation of arm pain.  She has a hx of CAD, aortic stenosis, HTN, HL, glaucoma, LBBB. She underwent CABG (LIMA-LAD, SVG-RCA, SVG-OM1/OM2) + pericardial tissue AVR in 07/2007. Her prosthetic aortic valve gradient has been noted to be increased over the last couple of years. Last seen by Dr. Antoine Poche 07/2012. Myoview 06/2011: No ischemia, EF 67%. Echocardiogram 04/2012: EF 55-60%, moderate aortic stenosis (mean gradient 34), mild AI, mild LAE, PASP 38.  Carotid U/S 5/13:  bilat 40-59%.  She was recently at the beach and noted right arm pain radiating up to her neck like prior angina which occurred while walking and resolved with rest.  She has had similar symptoms off and on since her CABG.  She has not had the severe pain she had prior to her CABG.  No syncope.  No orthopnea, PND, edema.  Saw her PCP Friday and was given prn NTG.  She did work out in the yard yesterday and pulled weeds.  She did have some dyspnea but no arm pain.  Typically stays very active.    Wt Readings from Last 3 Encounters:  11/22/12 163 lb (73.936 kg)  07/13/12 160 lb 12.8 oz (72.938 kg)  04/08/12 159 lb 6.4 oz (72.303 kg)     Past Medical History  Diagnosis Date  . Aortic stenosis     a.  s/p tissue AVR at time of CABG in 2009;  b. Echocardiogram 04/2012: EF 55-60%, moderate aortic stenosis (mean gradient 34), mild AI, mild LAE, PASP 38   . Dyslipidemia   . Hypertension   . Spinal arthritis   . CAD (coronary artery disease)     s/p CABG; Myoview 06/2011: No ischemia, EF 67%.  Marland Kitchen Hx of CABG     LIMA-LAD, SVG-RCA, SVG-OM1/OM2 in 2009  . Blindness of right eye     due to retinal bleed  .  Glaucoma(365)   . Left bundle branch block   . Carotid stenosis     Carotid U/S 5/13:  bilat 40-59%    Current Outpatient Prescriptions  Medication Sig Dispense Refill  . aspirin 81 MG tablet Take 81 mg by mouth daily.        Marland Kitchen atorvastatin (LIPITOR) 80 MG tablet Take 1 tablet (80 mg total) by mouth daily.  90 tablet  3  . benazepril (LOTENSIN) 20 MG tablet Take 1 tablet (20 mg total) by mouth at bedtime.  30 tablet  11  . Brimonidine Tartrate-Timolol (COMBIGAN OP) Apply to eye 2 (two) times daily. RIGHT EYE       . Calcium Carbonate (CALTRATE 600 PO) Take by mouth daily.        Marland Kitchen gabapentin (NEURONTIN) 300 MG capsule Take 300 mg by mouth daily.        Marland Kitchen HOMATROPINE HBR OP Apply to eye 2 (two) times daily. Right eye       . hydrochlorothiazide (HYDRODIURIL) 25 MG tablet Take 1 tablet (25 mg total) by mouth daily.  90 tablet  1  . NITROSTAT 0.3 MG SL tablet Place 0.3 mg under the tongue every 5 (five) minutes as needed.       Marland Kitchen  Vitamin D, Ergocalciferol, (DRISDOL) 50000 UNITS CAPS Take 50,000 Units by mouth every 7 (seven) days.        Marland Kitchen zolpidem (AMBIEN) 5 MG tablet Take 5 mg by mouth at bedtime. 10MG  HS         No current facility-administered medications for this visit.    Allergies:    Allergies  Allergen Reactions  . Zetia (Ezetimibe) Other (See Comments)    DIZZINESS    Social History:  The patient  reports that she has never smoked. She has never used smokeless tobacco. She reports that she does not drink alcohol or use illicit drugs.   ROS:  Please see the history of present illness.   All other systems reviewed and negative.   PHYSICAL EXAM: VS:  BP 152/94  Pulse 61  Ht 5\' 1"  (1.549 m)  Wt 163 lb (73.936 kg)  BMI 30.81 kg/m2 Well nourished, well developed, in no acute distress HEENT: normal Neck: no JVD Cardiac:  normal S1, S2; RRR; 2/6 harsh crescendo-decrescendo systolic murmur heard best at the RUSB Lungs:  clear to auscultation bilaterally, no wheezing,  rhonchi or rales Abd: soft, nontender, no hepatomegaly Ext: no edema Skin: warm and dry Neuro:  CNs 2-12 intact, no focal abnormalities noted  EKG:  NSR, HR 62, IVCD, no change from prior tracing     ASSESSMENT AND PLAN:  1. Arm Pain:  This was her previous anginal equivalent.  Her AV gradient had gotten much worse when last checked.  Will check f/u echo.  If AV gradient has worsened, she will likely need a cardiac cath.  If AV gradient is stable, will likely proceed with nuclear study.  Discussed case with Dr. [name] today who agreed.  2. Aortic Stenosis:  Proceed with f/u echo as noted. 3. CAD:  Proceed with echo as noted.  If AS is stable, will plan myoview.  She may continue with prn NTG.  Continue ASA and statin.  4. Hypertension:  Monitor for now.  Consider adding CCB vs nitrates if BP remains elevated. 5. Hyperlipidemia:  Continue statin.   6. Carotid Stenosis:  F/u by [facility]. 7. Disposition:  F/u with me or Dr. [name] in 2 weeks.   Luna Glasgow, PA-C  3:23 PM 11/22/2012

## 2012-11-22 NOTE — Patient Instructions (Addendum)
Your physician recommends that you continue on your current medications as directed. Please refer to the Current Medication list given to you today.   Your physician recommends that you keep your follow-up appointment with Tereso Newcomer, PA-C  Your physician has requested that you have an echocardiogram.  THIS WEEK PLEASE FOR CHEST PAIN AND AORTIC STENOSIS. Echocardiography is a painless test that uses sound waves to create images of your heart. It provides your doctor with information about the size and shape of your heart and how well your heart's chambers and valves are working. This procedure takes approximately one hour. There are no restrictions for this procedure.

## 2012-11-26 ENCOUNTER — Other Ambulatory Visit (INDEPENDENT_AMBULATORY_CARE_PROVIDER_SITE_OTHER): Payer: Medicare Other | Admitting: *Deleted

## 2012-11-26 ENCOUNTER — Ambulatory Visit: Payer: PRIVATE HEALTH INSURANCE | Admitting: Neurosurgery

## 2012-11-26 DIAGNOSIS — Z48812 Encounter for surgical aftercare following surgery on the circulatory system: Secondary | ICD-10-CM

## 2012-11-26 DIAGNOSIS — I6529 Occlusion and stenosis of unspecified carotid artery: Secondary | ICD-10-CM

## 2012-11-30 ENCOUNTER — Other Ambulatory Visit: Payer: Self-pay | Admitting: *Deleted

## 2012-12-06 ENCOUNTER — Encounter: Payer: Self-pay | Admitting: Vascular Surgery

## 2012-12-07 ENCOUNTER — Encounter: Payer: Self-pay | Admitting: Physician Assistant

## 2012-12-07 ENCOUNTER — Ambulatory Visit (HOSPITAL_COMMUNITY): Payer: Medicare Other | Attending: Cardiology | Admitting: Radiology

## 2012-12-07 DIAGNOSIS — I10 Essential (primary) hypertension: Secondary | ICD-10-CM

## 2012-12-07 DIAGNOSIS — I359 Nonrheumatic aortic valve disorder, unspecified: Secondary | ICD-10-CM

## 2012-12-07 DIAGNOSIS — R079 Chest pain, unspecified: Secondary | ICD-10-CM | POA: Insufficient documentation

## 2012-12-07 DIAGNOSIS — I2510 Atherosclerotic heart disease of native coronary artery without angina pectoris: Secondary | ICD-10-CM

## 2012-12-07 NOTE — Progress Notes (Signed)
Echocardiogram performed.  

## 2012-12-14 ENCOUNTER — Ambulatory Visit (INDEPENDENT_AMBULATORY_CARE_PROVIDER_SITE_OTHER): Payer: Medicare Other | Admitting: Physician Assistant

## 2012-12-14 ENCOUNTER — Encounter: Payer: Self-pay | Admitting: Physician Assistant

## 2012-12-14 VITALS — BP 132/64 | HR 62 | Ht 61.0 in | Wt 162.2 lb

## 2012-12-14 DIAGNOSIS — I209 Angina pectoris, unspecified: Secondary | ICD-10-CM

## 2012-12-14 DIAGNOSIS — I208 Other forms of angina pectoris: Secondary | ICD-10-CM

## 2012-12-14 DIAGNOSIS — I2510 Atherosclerotic heart disease of native coronary artery without angina pectoris: Secondary | ICD-10-CM

## 2012-12-14 DIAGNOSIS — I10 Essential (primary) hypertension: Secondary | ICD-10-CM

## 2012-12-14 DIAGNOSIS — I77810 Thoracic aortic ectasia: Secondary | ICD-10-CM

## 2012-12-14 DIAGNOSIS — E7800 Pure hypercholesterolemia, unspecified: Secondary | ICD-10-CM

## 2012-12-14 DIAGNOSIS — I359 Nonrheumatic aortic valve disorder, unspecified: Secondary | ICD-10-CM

## 2012-12-14 MED ORDER — ISOSORBIDE MONONITRATE ER 30 MG PO TB24: 30.0000 mg | ORAL_TABLET | Freq: Every day | ORAL | Status: DC

## 2012-12-14 NOTE — Progress Notes (Signed)
[address] [place], [place]  [address] Phone: [phone] Fax:  [phone]  Date:  12/14/2012   ID:  [name], DOB 03/05/1933, MRN [id number]  PCP:  Tarri Fuller, MD  Primary Cardiologist:  Dr. [name]     History of Present Illness: [name] is a 79 y.o. female who returns for f/u on aortic stenosis.  She has a hx of CAD, aortic stenosis, HTN, HL, glaucoma, LBBB. She underwent CABG (LIMA-LAD, SVG-RCA, SVG-OM1/OM2) + pericardial tissue AVR in 07/2007. Her prosthetic aortic valve gradient has been noted to be increased over the last couple of years.  Myoview 06/2011: No ischemia, EF 67%. Echocardiogram 04/2012: EF 55-60%, moderate aortic stenosis (mean gradient 34), mild AI, mild LAE, PASP 38.  Carotid U/S 5/13:  bilat 40-59%.    I saw her recently in f/u.  She had been at the beach and noted right arm pain radiating up to her neck like prior angina which occurred while walking and resolved with rest.   I arranged a f/u echo 12/07/12: mild LVH, mild FBSH, EF 55-60%, Gr 2 DD, mod AS, mean 36 mmHg, mild AI, PASP 44, mild to mod dilated asc aorta.  She tells me today that she has noted some more symptoms of L arm pain with exertion.  This is described as an ache.  She was at [facility] last week and had to stop walking b/c of the pain.  She notes relief with rest.  She notes assoc SOB.  No nausea, diaphoresis, syncope. She denies orthopnea, PND, edema.  This is like her prior angina but not as bad.  She has been able to doe other activities without symptoms (ie pulling weeds).    Labs (6/12):  K 4.3, Cr 1.2   Wt Readings from Last 3 Encounters:  12/14/12 162 lb 3.2 oz (73.573 kg)  11/22/12 163 lb (73.936 kg)  07/13/12 160 lb 12.8 oz (72.938 kg)     Past Medical History  Diagnosis Date  . Aortic stenosis     a.  s/p tissue AVR at time of CABG in 2009;  b. Echo 04/2012: EF 55-60%, moderate AS (mean 34);  c. Echo 6/14: Mild LVH, mild focal basal  septal hypertrophy, EF 55-60%, normal wall motion, grade 2 diastolic dysfunction, AVR with moderate aortic stenosis (mean 36) - consider TEE if clinically indicated, mild AI, mild MR, PASP 44, ascending aorta mild to moderately dilated (consider CTA or MRA)  . Dyslipidemia   . Hypertension   . Spinal arthritis   . CAD (coronary artery disease)     s/p CABG; Myoview 06/2011: No ischemia, EF 67%.  Marland Kitchen Hx of CABG     LIMA-LAD, SVG-RCA, SVG-OM1/OM2 in 2009  . Blindness of right eye     due to retinal bleed  . Glaucoma   . Left bundle branch block   . Carotid stenosis     Carotid U/S 5/13:  bilat 40-59%    Current Outpatient Prescriptions  Medication Sig Dispense Refill  . aspirin 81 MG tablet Take 81 mg by mouth daily.        Marland Kitchen atorvastatin (LIPITOR) 80 MG tablet Take 1 tablet (80 mg total) by mouth daily.  90 tablet  3  . benazepril (LOTENSIN) 20 MG tablet Take 1 tablet (20 mg total) by mouth at bedtime.  30 tablet  11  . Brimonidine Tartrate-Timolol (COMBIGAN OP) Apply to eye 2 (two) times daily. Both eyes      .  Calcium Carbonate (CALTRATE 600 PO) Take by mouth daily.        Marland Kitchen gabapentin (NEURONTIN) 300 MG capsule Take 300 mg by mouth daily.        . hydrochlorothiazide (HYDRODIURIL) 25 MG tablet Take 1 tablet (25 mg total) by mouth daily.  90 tablet  1  . NITROSTAT 0.3 MG SL tablet Place 0.3 mg under the tongue every 5 (five) minutes as needed.       . Vitamin D, Ergocalciferol, (DRISDOL) 50000 UNITS CAPS Take 50,000 Units by mouth every 7 (seven) days.        Marland Kitchen zolpidem (AMBIEN) 5 MG tablet Take 5 mg by mouth at bedtime. 10MG  HS         No current facility-administered medications for this visit.    Allergies:    Allergies  Allergen Reactions  . Zetia (Ezetimibe) Other (See Comments)    DIZZINESS    Social History:  The patient  reports that she has never smoked. She has never used smokeless tobacco. She reports that she does not drink alcohol or use illicit drugs.   ROS:   Please see the history of present illness.   All other systems reviewed and negative.   PHYSICAL EXAM: VS:  BP 132/64  Pulse 62  Ht 5\' 1"  (1.549 m)  Wt 162 lb 3.2 oz (73.573 kg)  BMI 30.66 kg/m2 Well nourished, well developed, in no acute distress HEENT: normal Neck: no JVD 90 Cardiac:  normal S1, S2; RRR; 2/6 harsh systolic murmur heard best at the RUSB, 2/6 diastolic murmur along the left sternal border Lungs:  clear to auscultation bilaterally, no wheezing, rhonchi or rales Abd: soft, nontender, no hepatomegaly Ext: no edema Skin: warm and dry Neuro:  CNs 2-12 intact, no focal abnormalities noted  EKG:  NSR, HR 62, IVCD, no change from prior tracing     ASSESSMENT AND PLAN:  1. Left Arm Pain:  This was her previous anginal equivalent.  Her AV gradient had gotten much worse when last checked.  Recent echo demonstrates similar AV gradient to previous echo.  She is probably describing CCS Class II angina.  I reviewed this again with Dr. [name] today.  We thought a trial of medical therapy would be a good initial approach.  I reviewed this with the patient and her husband.  They are comfortable with this.  I will start her on Imdur 30 mg QD.  She will keep her f/u with Dr. Antoine Poche in 1 month.  2. Aortic Stenosis:  Stable by recent echo.  If arm symptoms persist or worsen, she will need cardiac cath.  I discussed with Dr. Antoine Poche the echo report.  We do not think she needs a TEE at this time. 3. Dilated Ascending Aortic:  I reviewed this with Dr. [name].  We do not think she needs CTA or MRA at this time.  4. CAD:  Proceed with medical Rx as noted.  Continue ASA and statin.  If anginal symptoms progress or remain persistent with med Rx, consider proceeding with cardiac cath.  5. Hypertension:  Controlled. 6. Hyperlipidemia:  Continue statin.   7. Carotid Stenosis:  F/u by [facility]. 8. Disposition:  F/u with Dr. [name] in July as planned.   Signed, Tereso Newcomer,  PA-C  3:09 PM 12/14/2012

## 2012-12-14 NOTE — Patient Instructions (Addendum)
START IMDUR 30 MG DAILY; RX SENT IN TO [facility]  KEEP YOUR FOLLOW UP APPT WITH DR. HOCHREIN 01/11/13

## 2013-01-11 ENCOUNTER — Encounter: Payer: Self-pay | Admitting: *Deleted

## 2013-01-11 ENCOUNTER — Encounter: Payer: Self-pay | Admitting: Cardiology

## 2013-01-11 ENCOUNTER — Ambulatory Visit (INDEPENDENT_AMBULATORY_CARE_PROVIDER_SITE_OTHER): Payer: Medicare Other | Admitting: Cardiology

## 2013-01-11 VITALS — BP 130/80 | HR 62 | Wt 163.0 lb

## 2013-01-11 DIAGNOSIS — Z01810 Encounter for preprocedural cardiovascular examination: Secondary | ICD-10-CM

## 2013-01-11 DIAGNOSIS — I25810 Atherosclerosis of coronary artery bypass graft(s) without angina pectoris: Secondary | ICD-10-CM

## 2013-01-11 DIAGNOSIS — I359 Nonrheumatic aortic valve disorder, unspecified: Secondary | ICD-10-CM

## 2013-01-11 DIAGNOSIS — I350 Nonrheumatic aortic (valve) stenosis: Secondary | ICD-10-CM

## 2013-01-11 LAB — CBC
MCHC: 33.3 g/dL (ref 30.0–36.0)
MCV: 90.2 fl (ref 78.0–100.0)
RDW: 14.7 % — ABNORMAL HIGH (ref 11.5–14.6)

## 2013-01-11 LAB — BASIC METABOLIC PANEL
CO2: 30 mEq/L (ref 19–32)
Calcium: 9.5 mg/dL (ref 8.4–10.5)
Creatinine, Ser: 1.7 mg/dL — ABNORMAL HIGH (ref 0.4–1.2)
Glucose, Bld: 100 mg/dL — ABNORMAL HIGH (ref 70–99)

## 2013-01-11 LAB — PROTIME-INR
INR: 1 ratio (ref 0.8–1.0)
Prothrombin Time: 10.4 s (ref 10.2–12.4)

## 2013-01-11 NOTE — Progress Notes (Signed)
HPI    She has been having increasing symptoms of arm and chest pain.  We elected to manage her medically.  At the most recent visit she was started on Imdur.  Unfortunately she continues to get chest discomfort. This can happen with usual activities such as making the bed. (Class III). She says that she stops what she is doing it goes away after several minutes. She has an aching discomfort in her left shoulder and chest. She doesn't describe associated symptoms except for some shortness of breath. She doesn't think he got better with the Imdur that was added. However, she has got lightheaded after taking the Imdur. She's not had any presyncope or syncope. She thinks this discomfort is similar to her previous angina.  Allergies  Allergen Reactions  . Zetia (Ezetimibe) Other (See Comments)    DIZZINESS    Current Outpatient Prescriptions  Medication Sig Dispense Refill  . aspirin 81 MG tablet Take 81 mg by mouth daily.        Marland Kitchen atorvastatin (LIPITOR) 80 MG tablet Take 1 tablet (80 mg total) by mouth daily.  90 tablet  3  . benazepril (LOTENSIN) 20 MG tablet Take 1 tablet (20 mg total) by mouth at bedtime.  30 tablet  11  . Brimonidine Tartrate-Timolol (COMBIGAN OP) Apply to eye 2 (two) times daily. Both eyes      . Calcium Carbonate (CALTRATE 600 PO) Take by mouth daily.        Marland Kitchen gabapentin (NEURONTIN) 300 MG capsule Take 300 mg by mouth daily.        . hydrochlorothiazide (HYDRODIURIL) 25 MG tablet Take 1 tablet (25 mg total) by mouth daily.  90 tablet  1  . isosorbide mononitrate (IMDUR) 30 MG 24 hr tablet Take 1 tablet (30 mg total) by mouth daily.  90 tablet  3  . NITROSTAT 0.3 MG SL tablet Place 0.3 mg under the tongue every 5 (five) minutes as needed.       . Vitamin D, Ergocalciferol, (DRISDOL) 50000 UNITS CAPS Take 50,000 Units by mouth every 7 (seven) days.        Marland Kitchen zolpidem (AMBIEN) 5 MG tablet Take 5 mg by mouth at bedtime. 10MG  HS         No current facility-administered  medications for this visit.    Past Medical History  Diagnosis Date  . Aortic stenosis     a.  s/p tissue AVR at time of CABG in 2009;  b. Echo 04/2012: EF 55-60%, moderate AS (mean 34);  c. Echo 6/14: Mild LVH, mild focal basal septal hypertrophy, EF 55-60%, normal wall motion, grade 2 diastolic dysfunction, AVR with moderate aortic stenosis (mean 36) - consider TEE if clinically indicated, mild AI, mild MR, PASP 44, ascending aorta mild to moderately dilated (consider CTA or MRA)  . Dyslipidemia   . Hypertension   . Spinal arthritis   . CAD (coronary artery disease)     s/p CABG; Myoview 06/2011: No ischemia, EF 67%.  Marland Kitchen Hx of CABG     LIMA-LAD, SVG-RCA, SVG-OM1/OM2 in 2009  . Blindness of right eye     due to retinal bleed  . Glaucoma   . Left bundle branch block   . Carotid stenosis     Carotid U/S 5/13:  bilat 40-59%    Past Surgical History  Procedure Laterality Date  . Coronary artery bypass graft  Jan 2009    LIMA to LAD, SVG to RCA, SVG to OM  1 & 2  . Aortic valve replacement  Jan 2009    #21 mm pericardial prosthesis  . Back surgery    . Neck surgery    . Abdominal hysterectomy    . Cholecystectomy      ROS: As stated in the HPI and negative for all other systems.  PHYSICAL EXAM BP 130/80  Pulse 62  Wt 163 lb (73.936 kg)  BMI 30.81 kg/m2 GENERAL:  Well appearing HEENT: Right pupil fixed and lens opacified. Dentures.  NECK:  No jugular venous distention, waveform within normal limits, carotid upstroke brisk and symmetric, no bruits, no thyromegaly LYMPHATICS:  No cervical, inguinal adenopathy LUNGS:  Clear to auscultation bilaterally BACK:  No CVA tenderness CHEST:  Well healed sternotomy scar. HEART:  PMI not displaced or sustained,S1 and S2 within normal limits, no S3, no S4, no clicks, no rubs, mid peaking systolic murmur radiating out the outflow tract ABD:  Flat, positive bowel sounds normal in frequency in pitch, no bruits, no rebound, no guarding, no  midline pulsatile mass, no hepatomegaly, no splenomegaly EXT:  2 plus pulses throughout, no edema, no cyanosis no clubbing SKIN:  No rashes no nodules NEURO:  Cranial nerves II through XII grossly intact, motor grossly intact throughout PSYCH:  Cognitively intact, oriented to person place and time   ASSESSMENT AND PLAN  CAD (coronary artery disease) -  Given the class III symptoms and her intolerance of further med titration cardiac catheterization is indicated. She will have right and left heart catheterization. The patient understands that risks included but are not limited to stroke (1 in 1000), death (1 in 1000), kidney failure [usually temporary] (1 in 500), bleeding (1 in 200), allergic reaction [possibly serious] (1 in 200).  The patient understands and agrees to proceed.   Hypertension -  Her blood pressure is at target. No change in medications is indicated. We will continue with therapeutic lifestyle changes (TLC).   S/P aortic valve replacement with bioprosthetic valve -  She has had some worsening aortic stenosis. This will be assessed at the time of the cath.  Dyslipidemia -  She is at a target dose of statin.  Carotid stenosis - She has nonobstructive carotid stenosis followed by  [facility]

## 2013-01-11 NOTE — Patient Instructions (Addendum)
The current medical regimen is effective;  continue present plan and medications.  Your physician has requested that you have a cardiac catheterization. Cardiac catheterization is used to diagnose and/or treat various heart conditions. Doctors may recommend this procedure for a number of different reasons. The most common reason is to evaluate chest pain. Chest pain can be a symptom of coronary artery disease (CAD), and cardiac catheterization can show whether plaque is narrowing or blocking your heart's arteries. This procedure is also used to evaluate the valves, as well as measure the blood flow and oxygen levels in different parts of your heart. For further information please visit [email or web address] Please follow instruction sheet, as given.  Follow up will be made after your catheterization.

## 2013-01-12 ENCOUNTER — Encounter (HOSPITAL_COMMUNITY): Payer: Self-pay | Admitting: General Practice

## 2013-01-12 ENCOUNTER — Observation Stay (HOSPITAL_COMMUNITY)
Admission: AD | Admit: 2013-01-12 | Discharge: 2013-01-14 | Disposition: A | Discharge status: Home / Self Care | Payer: Medicare Other | Source: Ambulatory Visit | Attending: Cardiology | Admitting: Cardiology

## 2013-01-12 DIAGNOSIS — I779 Disorder of arteries and arterioles, unspecified: Secondary | ICD-10-CM | POA: Diagnosis present

## 2013-01-12 DIAGNOSIS — N183 Chronic kidney disease, stage 3 unspecified: Secondary | ICD-10-CM | POA: Insufficient documentation

## 2013-01-12 DIAGNOSIS — Z79899 Other long term (current) drug therapy: Secondary | ICD-10-CM | POA: Insufficient documentation

## 2013-01-12 DIAGNOSIS — Y840 Cardiac catheterization as the cause of abnormal reaction of the patient, or of later complication, without mention of misadventure at the time of the procedure: Secondary | ICD-10-CM | POA: Insufficient documentation

## 2013-01-12 DIAGNOSIS — Z7982 Long term (current) use of aspirin: Secondary | ICD-10-CM | POA: Insufficient documentation

## 2013-01-12 DIAGNOSIS — I6529 Occlusion and stenosis of unspecified carotid artery: Secondary | ICD-10-CM | POA: Insufficient documentation

## 2013-01-12 DIAGNOSIS — IMO0002 Reserved for concepts with insufficient information to code with codable children: Secondary | ICD-10-CM | POA: Insufficient documentation

## 2013-01-12 DIAGNOSIS — I10 Essential (primary) hypertension: Secondary | ICD-10-CM | POA: Diagnosis present

## 2013-01-12 DIAGNOSIS — Z953 Presence of xenogenic heart valve: Secondary | ICD-10-CM

## 2013-01-12 DIAGNOSIS — I200 Unstable angina: Secondary | ICD-10-CM

## 2013-01-12 DIAGNOSIS — I2510 Atherosclerotic heart disease of native coronary artery without angina pectoris: Principal | ICD-10-CM | POA: Diagnosis present

## 2013-01-12 DIAGNOSIS — I129 Hypertensive chronic kidney disease with stage 1 through stage 4 chronic kidney disease, or unspecified chronic kidney disease: Secondary | ICD-10-CM | POA: Insufficient documentation

## 2013-01-12 DIAGNOSIS — E785 Hyperlipidemia, unspecified: Secondary | ICD-10-CM | POA: Diagnosis present

## 2013-01-12 DIAGNOSIS — I359 Nonrheumatic aortic valve disorder, unspecified: Secondary | ICD-10-CM | POA: Insufficient documentation

## 2013-01-12 DIAGNOSIS — Z952 Presence of prosthetic heart valve: Secondary | ICD-10-CM | POA: Insufficient documentation

## 2013-01-12 HISTORY — DX: Gastro-esophageal reflux disease without esophagitis: K21.9

## 2013-01-12 HISTORY — DX: Chronic kidney disease, unspecified: N18.9

## 2013-01-12 HISTORY — DX: Unspecified osteoarthritis, unspecified site: M19.90

## 2013-01-12 LAB — CBC
HCT: 34.6 % — ABNORMAL LOW (ref 36.0–46.0)
Hemoglobin: 11.4 g/dL — ABNORMAL LOW (ref 12.0–15.0)
MCHC: 32.9 g/dL (ref 30.0–36.0)
MCV: 87.6 fL (ref 78.0–100.0)
RDW: 13.7 % (ref 11.5–15.5)

## 2013-01-12 LAB — CREATININE, SERUM: GFR calc non Af Amer: 41 mL/min — ABNORMAL LOW (ref >90)

## 2013-01-12 MED ORDER — ASPIRIN EC 81 MG PO TBEC
81.0000 mg | DELAYED_RELEASE_TABLET | Freq: Every day | ORAL | Status: DC
  Filled 2013-01-12: qty 1

## 2013-01-12 MED ORDER — ATORVASTATIN CALCIUM 80 MG PO TABS
80.0000 mg | ORAL_TABLET | Freq: Every day | ORAL | Status: DC
  Administered 2013-01-13: 80 mg via ORAL
  Filled 2013-01-12 (×2): qty 1

## 2013-01-12 MED ORDER — ASPIRIN 81 MG PO TABS: 81.0000 mg | ORAL_TABLET | Freq: Every day | ORAL | Status: DC

## 2013-01-12 MED ORDER — GABAPENTIN 300 MG PO CAPS
300.0000 mg | ORAL_CAPSULE | Freq: Every day | ORAL | Status: DC
  Administered 2013-01-12 – 2013-01-13 (×2): 300 mg via ORAL
  Filled 2013-01-12 (×3): qty 1

## 2013-01-12 MED ORDER — ONDANSETRON HCL 4 MG/2ML IJ SOLN: 4.0000 mg | Freq: Four times a day (QID) | INTRAMUSCULAR | Status: DC | PRN

## 2013-01-12 MED ORDER — ATORVASTATIN CALCIUM 80 MG PO TABS
80.0000 mg | ORAL_TABLET | Freq: Every day | ORAL | Status: DC
  Filled 2013-01-12: qty 1

## 2013-01-12 MED ORDER — NITROGLYCERIN 0.4 MG SL SUBL
0.4000 mg | SUBLINGUAL_TABLET | SUBLINGUAL | Status: DC | PRN
Start: 2013-01-12 — End: 2013-01-14

## 2013-01-12 MED ORDER — ISOSORBIDE MONONITRATE ER 30 MG PO TB24
30.0000 mg | ORAL_TABLET | Freq: Every day | ORAL | Status: DC
  Filled 2013-01-12: qty 1

## 2013-01-12 MED ORDER — ASPIRIN EC 81 MG PO TBEC
81.0000 mg | DELAYED_RELEASE_TABLET | Freq: Every day | ORAL | Status: DC
  Filled 2013-01-12 (×2): qty 1

## 2013-01-12 MED ORDER — BRIMONIDINE TARTRATE-TIMOLOL 0.2-0.5 % OP SOLN
1.0000 [drp] | Freq: Two times a day (BID) | OPHTHALMIC | Status: DC
  Administered 2013-01-13: 1 [drp] via OPHTHALMIC

## 2013-01-12 MED ORDER — BRIMONIDINE TARTRATE 0.2 % OP SOLN
1.0000 [drp] | Freq: Two times a day (BID) | OPHTHALMIC | Status: DC
  Filled 2013-01-12: qty 5

## 2013-01-12 MED ORDER — SODIUM CHLORIDE 0.9 % IV SOLN
INTRAVENOUS | Status: DC
  Administered 2013-01-12 – 2013-01-13 (×2): via INTRAVENOUS

## 2013-01-12 MED ORDER — BRIMONIDINE TARTRATE-TIMOLOL 0.2-0.5 % OP SOLN: 1.0000 [drp] | Freq: Two times a day (BID) | OPHTHALMIC | Status: DC

## 2013-01-12 MED ORDER — GABAPENTIN 300 MG PO CAPS
300.0000 mg | ORAL_CAPSULE | Freq: Every day | ORAL | Status: DC
  Filled 2013-01-12: qty 1

## 2013-01-12 MED ORDER — TIMOLOL MALEATE 0.5 % OP SOLN
1.0000 [drp] | Freq: Two times a day (BID) | OPHTHALMIC | Status: DC
  Filled 2013-01-12: qty 5

## 2013-01-12 MED ORDER — ISOSORBIDE MONONITRATE ER 30 MG PO TB24
30.0000 mg | ORAL_TABLET | Freq: Every day | ORAL | Status: DC
  Administered 2013-01-13: 30 mg via ORAL
  Filled 2013-01-12 (×2): qty 1

## 2013-01-12 MED ORDER — ACETAMINOPHEN 325 MG PO TABS
650.0000 mg | ORAL_TABLET | ORAL | Status: DC | PRN
  Administered 2013-01-13 (×2): 650 mg via ORAL
  Filled 2013-01-12 (×2): qty 2

## 2013-01-12 MED ORDER — HEPARIN SODIUM (PORCINE) 5000 UNIT/ML IJ SOLN
5000.0000 [IU] | Freq: Three times a day (TID) | INTRAMUSCULAR | Status: DC
  Administered 2013-01-12 – 2013-01-13 (×3): 5000 [IU] via SUBCUTANEOUS
  Filled 2013-01-12 (×6): qty 1

## 2013-01-12 MED ORDER — ZOLPIDEM TARTRATE 5 MG PO TABS
5.0000 mg | ORAL_TABLET | Freq: Every day | ORAL | Status: DC
  Administered 2013-01-12 – 2013-01-13 (×2): 5 mg via ORAL
  Filled 2013-01-12 (×2): qty 1

## 2013-01-12 NOTE — Plan of Care (Signed)
Problem: Consults Goal: Diabetes Guidelines if Diabetic/Glucose > 140 If diabetic or lab glucose is > 140 mg/dl - Initiate Diabetes/Hyperglycemia Guidelines & Document Interventions  Outcome: Not Applicable Date Met:  01/12/13 Non diabetic CBG ok on BMP

## 2013-01-12 NOTE — Progress Notes (Signed)
Patient education video #115 played for Pt and husband and offered questions after video.   Jacqulyn Cane RN, BSN, CCRN

## 2013-01-12 NOTE — H&P (Signed)
Primary cardiologist: J. Hochrein, MD  HPI     She has been having increasing symptoms of arm and chest pain.  We elected to manage her medically.  At the most recent visit she was started on Imdur.  Unfortunately she continues to get chest discomfort. This can happen with usual activities such as making the bed. (Class III). She says that she stops what she is doing it goes away after several minutes. She has an aching discomfort in her left shoulder and chest. She doesn't describe associated symptoms except for some shortness of breath. She doesn't think he got better with the Imdur that was added. However, she has got lightheaded after taking the Imdur. She's not had any presyncope or syncope. She thinks this discomfort is similar to her previous angina.    Allergies   Allergen  Reactions   .  Zetia (Ezetimibe)  Other (See Comments)       DIZZINESS       Current Outpatient Prescriptions   Medication  Sig  Dispense  Refill   .  aspirin 81 MG tablet  Take 81 mg by mouth daily.           Marland Kitchen  atorvastatin (LIPITOR) 80 MG tablet  Take 1 tablet (80 mg total) by mouth daily.   90 tablet   3   .  benazepril (LOTENSIN) 20 MG tablet  Take 1 tablet (20 mg total) by mouth at bedtime.   30 tablet   11   .  Brimonidine Tartrate-Timolol (COMBIGAN OP)  Apply to eye 2 (two) times daily. Both eyes         .  Calcium Carbonate (CALTRATE 600 PO)  Take by mouth daily.           Marland Kitchen  gabapentin (NEURONTIN) 300 MG capsule  Take 300 mg by mouth daily.           .  hydrochlorothiazide (HYDRODIURIL) 25 MG tablet  Take 1 tablet (25 mg total) by mouth daily.   90 tablet   1   .  isosorbide mononitrate (IMDUR) 30 MG 24 hr tablet  Take 1 tablet (30 mg total) by mouth daily.   90 tablet   3   .  NITROSTAT 0.3 MG SL tablet  Place 0.3 mg under the tongue every 5 (five) minutes as needed.          .  Vitamin D, Ergocalciferol, (DRISDOL) 50000 UNITS CAPS  Take 50,000 Units by mouth every 7 (seven) days.           Marland Kitchen  zolpidem  (AMBIEN) 5 MG tablet  Take 5 mg by mouth at bedtime. 10MG  HS               No current facility-administered medications for this visit.       Past Medical History   Diagnosis  Date   .  Aortic stenosis         a.  s/p tissue AVR at time of CABG in 2009;  b. Echo 04/2012: EF 55-60%, moderate AS (mean 34);  c. Echo 6/14: Mild LVH, mild focal basal septal hypertrophy, EF 55-60%, normal Marthe Dant motion, grade 2 diastolic dysfunction, AVR with moderate aortic stenosis (mean 36) - consider TEE if clinically indicated, mild AI, mild MR, PASP 44, ascending aorta mild to moderately dilated (consider CTA or MRA)   .  Dyslipidemia     .  Hypertension     .  Spinal arthritis     .  CAD (coronary artery disease)         s/p CABG; Myoview 06/2011: No ischemia, EF 67%.   Marland Kitchen  Hx of CABG         LIMA-LAD, SVG-RCA, SVG-OM1/OM2 in 2009   .  Blindness of right eye         due to retinal bleed   .  Glaucoma     .  Left bundle branch block     .  Carotid stenosis         Carotid U/S 5/13:  bilat 40-59%       Past Surgical History   Procedure  Laterality  Date   .  Coronary artery bypass graft    Jan 2009       LIMA to LAD, SVG to RCA, SVG to OM 1 & 2   .  Aortic valve replacement    Jan 2009       #21 mm pericardial prosthesis   .  Back surgery       .  Neck surgery       .  Abdominal hysterectomy       .  Cholecystectomy          ROS: As stated in the HPI and negative for all other systems.   PHYSICAL EXAM BP 130/80  Pulse 62  Wt 163 lb (73.936 kg)  BMI 30.81 kg/m2 GENERAL:  Well appearing HEENT: Right pupil fixed and lens opacified. Dentures.   NECK:  No jugular venous distention, waveform within normal limits, carotid upstroke brisk and symmetric, no bruits, no thyromegaly LYMPHATICS:  No cervical, inguinal adenopathy LUNGS:  Clear to auscultation bilaterally BACK:  No CVA tenderness CHEST:  Well healed sternotomy scar. HEART:  PMI not displaced or sustained,S1 and S2 within normal  limits, no S3, no S4, no clicks, no rubs, mid peaking systolic murmur radiating out the outflow tract ABD:  Flat, positive bowel sounds normal in frequency in pitch, no bruits, no rebound, no guarding, no midline pulsatile mass, no hepatomegaly, no splenomegaly EXT:  2 plus pulses throughout, no edema, no cyanosis no clubbing SKIN:  No rashes no nodules NEURO:  Cranial nerves II through XII grossly intact, motor grossly intact throughout PSYCH:  Cognitively intact, oriented to person place and time   ASSESSMENT AND PLAN   CAD (coronary artery disease) -   Given the class III symptoms and her intolerance of further med titration cardiac catheterization is indicated. She will have right and left heart catheterization. The patient understands that risks included but are not limited to stroke (1 in 1000), death (1 in 1000), kidney failure [usually temporary] (1 in 500), bleeding (1 in 200), allergic reaction [possibly serious] (1 in 200).  The patient understands and agrees to proceed.    Hypertension -  Her blood pressure is at target. No change in medications is indicated. We will continue with therapeutic lifestyle changes (TLC).    S/P aortic valve replacement with bioprosthetic valve -  She has had some worsening aortic stenosis. This will be assessed at the time of the cath.  Dyslipidemia -  She is at a target dose of statin.   Carotid stenosis - She has nonobstructive carotid stenosis followed by  [facility]  [name] 01/11/2013 _____________  Addendum:          I have taken a history, reviewed medications, allergies, PMH, SH, FH, and reviewed ROS and examined the patient.  I agree with the assessment and plan. All  questions answered.  Kirstine Jacquin C. Daleen Squibb, MD, [facility] Pager:  [phone]

## 2013-01-12 NOTE — Plan of Care (Signed)
Problem: Consults Goal: Tobacco Cessation referral if indicated Outcome: Not Applicable Date Met:  01/12/13 Non smoker

## 2013-01-13 ENCOUNTER — Encounter (HOSPITAL_COMMUNITY)
Admission: AD | Disposition: A | Discharge status: Home / Self Care | Payer: Self-pay | Source: Ambulatory Visit | Attending: Cardiology

## 2013-01-13 ENCOUNTER — Encounter (HOSPITAL_BASED_OUTPATIENT_CLINIC_OR_DEPARTMENT_OTHER): Admission: RE | Payer: Self-pay | Source: Ambulatory Visit

## 2013-01-13 ENCOUNTER — Ambulatory Visit (HOSPITAL_COMMUNITY): Admission: RE | Admit: 2013-01-13 | Payer: Medicare Other | Source: Ambulatory Visit | Admitting: Cardiology

## 2013-01-13 ENCOUNTER — Inpatient Hospital Stay (HOSPITAL_BASED_OUTPATIENT_CLINIC_OR_DEPARTMENT_OTHER): Admission: RE | Admit: 2013-01-13 | Payer: Medicare Other | Source: Ambulatory Visit | Admitting: Cardiology

## 2013-01-13 DIAGNOSIS — R079 Chest pain, unspecified: Secondary | ICD-10-CM

## 2013-01-13 DIAGNOSIS — I2510 Atherosclerotic heart disease of native coronary artery without angina pectoris: Secondary | ICD-10-CM

## 2013-01-13 HISTORY — PX: LEFT AND RIGHT HEART CATHETERIZATION WITH CORONARY/GRAFT ANGIOGRAM: SHX5448

## 2013-01-13 LAB — CBC
MCH: 29 pg (ref 26.0–34.0)
MCHC: 33.1 g/dL (ref 30.0–36.0)
Platelets: 175 10*3/uL (ref 150–400)
RDW: 13.8 % (ref 11.5–15.5)

## 2013-01-13 LAB — BASIC METABOLIC PANEL
Calcium: 9.7 mg/dL (ref 8.4–10.5)
GFR calc Af Amer: 49 mL/min — ABNORMAL LOW (ref >90)
GFR calc non Af Amer: 42 mL/min — ABNORMAL LOW (ref >90)
Glucose, Bld: 110 mg/dL — ABNORMAL HIGH (ref 70–99)
Sodium: 140 mEq/L (ref 135–145)

## 2013-01-13 LAB — POCT I-STAT 3, ART BLOOD GAS (G3+): Acid-base deficit: 3 mmol/L — ABNORMAL HIGH (ref 0.0–2.0)

## 2013-01-13 LAB — POCT I-STAT 3, VENOUS BLOOD GAS (G3P V)
pCO2, Ven: 41.1 mmHg — ABNORMAL LOW (ref 45.0–50.0)
pO2, Ven: 33 mmHg (ref 30.0–45.0)

## 2013-01-13 LAB — HEMOGLOBIN AND HEMATOCRIT, BLOOD
HCT: 29.7 % — ABNORMAL LOW (ref 36.0–46.0)
Hemoglobin: 10 g/dL — ABNORMAL LOW (ref 12.0–15.0)

## 2013-01-13 LAB — POCT ACTIVATED CLOTTING TIME: Activated Clotting Time: 135 seconds

## 2013-01-13 SURGERY — JV LEFT AND RIGHT HEART CATHETERIZATION WITH CORONARY ANGIOGRAM

## 2013-01-13 SURGERY — LEFT AND RIGHT HEART CATHETERIZATION WITH CORONARY/GRAFT ANGIOGRAM
Anesthesia: LOCAL

## 2013-01-13 MED ORDER — LABETALOL HCL 5 MG/ML IV SOLN
10.0000 mg | Freq: Once | INTRAVENOUS | Status: AC
  Administered 2013-01-13: 10 mg via INTRAVENOUS

## 2013-01-13 MED ORDER — HEPARIN SODIUM (PORCINE) 5000 UNIT/ML IJ SOLN
5000.0000 [IU] | Freq: Three times a day (TID) | INTRAMUSCULAR | Status: DC
  Filled 2013-01-13 (×5): qty 1

## 2013-01-13 MED ORDER — MIDAZOLAM HCL 2 MG/2ML IJ SOLN
INTRAMUSCULAR | Status: AC
  Filled 2013-01-13: qty 2

## 2013-01-13 MED ORDER — LIDOCAINE HCL (PF) 1 % IJ SOLN
INTRAMUSCULAR | Status: AC
  Filled 2013-01-13: qty 30

## 2013-01-13 MED ORDER — ASPIRIN 81 MG PO CHEW
324.0000 mg | CHEWABLE_TABLET | ORAL | Status: AC
  Administered 2013-01-13: 324 mg via ORAL
  Filled 2013-01-13: qty 4

## 2013-01-13 MED ORDER — NITROGLYCERIN 0.2 MG/ML ON CALL CATH LAB
INTRAVENOUS | Status: AC
  Filled 2013-01-13: qty 1

## 2013-01-13 MED ORDER — OXYCODONE HCL 5 MG PO TABS
5.0000 mg | ORAL_TABLET | Freq: Four times a day (QID) | ORAL | Status: DC | PRN
  Administered 2013-01-13 – 2013-01-14 (×2): 5 mg via ORAL
  Filled 2013-01-13 (×2): qty 1

## 2013-01-13 MED ORDER — SODIUM CHLORIDE 0.9 % IV SOLN: INTRAVENOUS | Status: AC

## 2013-01-13 MED ORDER — LABETALOL HCL 5 MG/ML IV SOLN
INTRAVENOUS | Status: AC
  Filled 2013-01-13: qty 4

## 2013-01-13 MED ORDER — FENTANYL CITRATE 0.05 MG/ML IJ SOLN
INTRAMUSCULAR | Status: AC
  Filled 2013-01-13: qty 2

## 2013-01-13 MED ORDER — HEPARIN (PORCINE) IN NACL 2-0.9 UNIT/ML-% IJ SOLN
INTRAMUSCULAR | Status: AC
  Filled 2013-01-13: qty 1000

## 2013-01-13 NOTE — CV Procedure (Signed)
   Cardiac Catheterization Procedure Note  Name: [name] MRN: [id number] DOB: [birth year]  Procedure: Right Heart Cath, Left Heart Cath, Selective Coronary Angiography, LV angiography  Indication:  Chest pain suggestive of unstable angina, CAD s/p CABG, abnormal stress test and aortic stenosis.  Procedural Details: The right groin was prepped, draped, and anesthetized with 1% lidocaine. Using the modified Seldinger technique a 5 French sheath was placed in the right femoral artery and a 7 French sheath was placed in the right femoral vein. A Swan-Ganz catheter was used for the right heart catheterization. Standard protocol was followed for recording of right heart pressures and sampling of oxygen saturations. Fick cardiac output was calculated. Standard Judkins catheters were used for selective coronary angiography and left ventriculography. There were no immediate procedural complications. The patient was transferred to the post catheterization recovery area for further monitoring.  Procedural Findings:  Hemodynamics:               RA 6    RV 48/3    PA 39/16  Mean 27    PCWP 18    LV 215/10    AO 194/73    AO Valve area   .92    AO Valve gradient  21   Oxygen saturations:    PA 60%    AO 90%   Cardiac Output (Fick) 4.53                               Cardiac Index (Fick) 2.63   Coronary angiography:  Coronary dominance: Right  Left mainstem: luminal irregularities.  Left anterior descending (LAD):  The LAD fills via the LIMA.  There is no high grade distal disease.  It is a somewhat small vessel.  The diagonal backfills via this graft and has no high grade disease.   Left circumflex (LCx): AV groove high grade proximal stenosis.  Occluded between OM1 and OM2.  These vessels fill the the SVG.  Both of the OMs are moderate sized and free of high grade disease.    Right coronary artery (RCA): Not selectively engaged.  However, it was seen to be occluded proximally after  injection of the SVG to the RCA.  The PDA and PLs are free of high grade disease but they are somewhat small vessels.  Grafts:  SVG to RCA:  Widely patent.    SVG sequential to OM1 and OM2:  Widely patent  LIMA to LAD:  Patent.  Left ventriculography: Left ventricular was not injected secondary to renal insufficiency.  Final Conclusions:  Severe native vessel disease.  Patent grafts.  Moderate aortic valve stenosis.  HTN and mildly elevated PCWP and PA pressures  Recommendations: Continue medical management.     [name] 01/13/2013, 9:57 AM

## 2013-01-13 NOTE — Progress Notes (Signed)
Went into pts room, noticed groin swelling; pt states sites feels fine and no back pain; BP 137/56, HR 67; cath lab called to access

## 2013-01-13 NOTE — Progress Notes (Signed)
Pat from cath lab held of pressure, marked site, and applied new pressure dressing; +2 pedal pulses noted; will continue to monitor

## 2013-01-13 NOTE — Progress Notes (Signed)
pts bedrest up, checked groin once pt got back to bed, noticed hematoma had spread a little to the L of the site; pt without pain, VSS; Alinda Money, PA paged and made aware; pt to remain on bedrest for the rest of the night, pt to receive no subQ heparin and to place SCDs, H/H ordered;

## 2013-01-13 NOTE — Progress Notes (Signed)
Utilization review completed.  

## 2013-01-13 NOTE — Interval H&P Note (Signed)
History and Physical Interval Note:  01/13/2013 8:47 AM  [name]  has presented today for surgery, with the diagnosis of Aortic stenosis  The various methods of treatment have been discussed with the patient and family. After consideration of risks, benefits and other options for treatment, the patient has consented to  Procedure(s): LEFT AND RIGHT HEART CATHETERIZATION WITH CORONARY/GRAFT ANGIOGRAM (N/A) as a surgical intervention .  The patient's history has been reviewed, patient examined, no change in status, stable for surgery.  I have reviewed the patient's chart and labs.  Questions were answered to the patient's satisfaction.     [name]

## 2013-01-14 ENCOUNTER — Encounter (HOSPITAL_COMMUNITY): Payer: Self-pay | Admitting: Nurse Practitioner

## 2013-01-14 DIAGNOSIS — I200 Unstable angina: Secondary | ICD-10-CM

## 2013-01-14 LAB — BASIC METABOLIC PANEL
BUN: 19 mg/dL (ref 6–23)
Calcium: 10 mg/dL (ref 8.4–10.5)
Creatinine, Ser: 1.23 mg/dL — ABNORMAL HIGH (ref 0.50–1.10)
GFR calc Af Amer: 47 mL/min — ABNORMAL LOW (ref >90)
GFR calc non Af Amer: 41 mL/min — ABNORMAL LOW (ref >90)

## 2013-01-14 NOTE — Plan of Care (Signed)
Problem: Phase I Progression Outcomes Goal: Vascular site scale level 0 - I Vascular Site Scale Level 0: No bruising/bleeding/hematoma Level I (Mild): Bruising/Ecchymosis, minimal bleeding/ooozing, palpable hematoma < 3 cm Level II (Moderate): Bleeding not affecting hemodynamic parameters, pseudoaneurysm, palpable hematoma > 3 cm Level III (Severe) Bleeding which affects hemodynamic parameters or retroperitoneal hemorrhage  Outcome: Adequate for Discharge Pt vascular site level 2

## 2013-01-14 NOTE — Plan of Care (Signed)
Problem: Phase II Progression Outcomes Goal: Vascular site scale level 0 - I Vascular Site Scale Level 0: No bruising/bleeding/hematoma Level I (Mild): Bruising/Ecchymosis, minimal bleeding/ooozing, palpable hematoma < 3 cm Level II (Moderate): Bleeding not affecting hemodynamic parameters, pseudoaneurysm, palpable hematoma > 3 cm Level III (Severe) Bleeding which affects hemodynamic parameters or retroperitoneal hemorrhage  Outcome: Adequate for Discharge Vascular site level 2

## 2013-01-14 NOTE — Plan of Care (Signed)
Problem: Phase III Progression Outcomes Goal: Vascular site scale level 0 - I Vascular Site Scale Level 0: No bruising/bleeding/hematoma Level I (Mild): Bruising/Ecchymosis, minimal bleeding/ooozing, palpable hematoma < 3 cm Level II (Moderate): Bleeding not affecting hemodynamic parameters, pseudoaneurysm, palpable hematoma > 3 cm Level III (Severe) Bleeding which affects hemodynamic parameters or retroperitoneal hemorrhage  Outcome: Adequate for Discharge Vascular site level2

## 2013-01-14 NOTE — Progress Notes (Signed)
   SUBJECTIVE:  She denies any pain.  No SOB.  She is not having any groin in back pain.    PHYSICAL EXAM Filed Vitals:   01/13/13 1630 01/13/13 1730 01/13/13 2031 01/14/13 0606  BP: 113/50 127/48 149/59 176/61  Pulse:   69 77  Temp:   98.3 F (36.8 C) 98.2 F (36.8 C)  TempSrc:   Oral Oral  Resp:   18 18  Height:      Weight:      SpO2:   97% 98%   General:  No distress Lungs:  Clear Heart:  RRR Abdomen:  Positive bowel sounds, no rebound no guarding Extremities:  Right groin with hematoma 5 x 6 cm.  No tenderness.  No bruit or pulsatile mass.    LABS: No results found for this basename: CKTOTAL, CKMB, CKMBINDEX, TROPONINI   Results for orders placed during the hospital encounter of 01/12/13 (from the past 24 hour(s))  HEMOGLOBIN AND HEMATOCRIT, BLOOD     Status: Abnormal   Collection Time    01/13/13  8:11 PM      Result Value Range   Hemoglobin 10.0 (*) 12.0 - 15.0 g/dL   HCT 14.7 (*) 82.9 - 56.2 %    Intake/Output Summary (Last 24 hours) at 01/14/13 0730 Last data filed at 01/13/13 2300  Gross per 24 hour  Intake    120 ml  Output      0 ml  Net    120 ml    ASSESSMENT AND PLAN:  CAD:  Stable disease with patent grafts.  Continue medical management.  AS:  Moderate. No change in therapy.   CKD:  Stage III:  Labs pending this am.  Hematoma:  Needs to ambulate today.  Hgb dropped but stable overnight.  No evidence of pseudoaneurysm or AV fistula.  If stable after ambulation she can go home with nurse visit follow up next week.  APP/MD appt in two weeks.    Fayrene Fearing Ciria Bernardini 01/14/2013 7:30 AM

## 2013-01-14 NOTE — Progress Notes (Signed)
D/c orders received;IV removed with gauze on, pt remains in stable condition, pt meds and instructions reviewed and given to pt; pt d/c to home 

## 2013-01-14 NOTE — Progress Notes (Signed)
Pt ambulating in hallway, no complications with groin site noted; pt states groin feels fine

## 2013-01-14 NOTE — Plan of Care (Signed)
Problem: Discharge Progression Outcomes Goal: Vascular site scale level 0 - I Vascular Site Scale Level 0: No bruising/bleeding/hematoma Level I (Mild): Bruising/Ecchymosis, minimal bleeding/ooozing, palpable hematoma < 3 cm Level II (Moderate): Bleeding not affecting hemodynamic parameters, pseudoaneurysm, palpable hematoma > 3 cm Level III (Severe) Bleeding which affects hemodynamic parameters or retroperitoneal hemorrhage  Outcome: Adequate for Discharge Vascular site level 2

## 2013-01-14 NOTE — Discharge Summary (Signed)
Patient ID: [name],  MRN: [id number], DOB/AGE: 10-06-1932 79 y.o.  Admit date: 01/12/2013 Discharge date: 01/14/2013  Primary Care Provider: Tarri Fuller Primary Cardiologist: J. Jaleeah Slight, MD   Discharge Diagnoses Principal Problem:   Unstable Angina  **s/p cath this admission revealing native CAD with 4/4 patent grafts. Active Problems:   Creatinine Elevation (1.7 on pre-cath labs, 1.2 range since)   CAD (coronary artery disease)   S/P aortic valve replacement with bioprosthetic valve   Dyslipidemia   Hypertension   Occlusion and stenosis of carotid artery without mention of cerebral infarction   Allergies Allergies  Allergen Reactions  . Zetia (Ezetimibe) Other (See Comments)    DIZZINESS   Procedures  Cardiac Catheterization 01/13/2013  Procedural Findings:  Hemodynamics:                                     RA 6                                    RV 48/3                                     PA 39/16  Mean 27                                     PCWP 18                                     LV 215/10                                     AO 194/73                                     AO Valve area   .92                                     AO Valve gradient  21              Oxygen saturations:                                     PA 60%                                     AO 90%              Cardiac Output (Fick) 4.53                               Cardiac Index (Fick) 2.63   Coronary angiography:  Coronary dominance: Right  Left mainstem: luminal irregularities. Left anterior descending (  LAD):  The LAD fills via the LIMA.  There is no high grade distal disease.  It is a somewhat small vessel.  The diagonal backfills via this graft and has no high grade disease.   Left circumflex (LCx): AV groove high grade proximal stenosis.  Occluded between OM1 and OM2.  These vessels fill the the SVG.  Both of the OMs are moderate sized and free of high grade disease.     Right coronary artery (RCA): Not selectively engaged.  However, it was seen to be occluded proximally after injection of the SVG to the RCA.  The PDA and PLs are free of high grade disease but they are somewhat small vessels.  Grafts:  SVG to RCA:  Widely patent.   SVG sequential to OM1 and OM2:  Widely patent LIMA to LAD:  Patent. Left ventriculography: Left ventricular was not injected secondary to renal insufficiency. Final Conclusions:  Severe native vessel disease.  Patent grafts.  Moderate aortic valve stenosis.  HTN and mildly elevated PCWP and PA pressures Recommendations: Continue medical management.   _____________   History of Present Illness  79 year old female with prior history of coronary artery disease and aortic valvular disease status post coronary artery bypass grafting and aortic valve replacement who was recently seen in clinic with complaints of exertional chest and arm discomfort with occasional dyspnea.  Decision was made to pursue diagnostic catheterization labs were drawn in clinic. Her creatinine returned at 1.7 and decision was made to admit her for pre-catheterization hydration.  Hospital Course  Patient presented to [facility] on July 9 for elective admission.  Followup creatinine that day was 1.24.  She was placed on a saline infusion in preparation for diagnostic catheterization. This took place on July 10, and revealed native coronary artery disease as outlined above with 4 of 4 patent grafts. Continued medical therapy is recommended. Post procedure she's been doing without recurrent symptoms or limitations and creatinine remained stable at 1.23. She'll be discharged home today in good condition with followup arranged for next week.  Discharge Vitals Blood pressure 176/61, pulse 77, temperature 98.2 F (36.8 C), temperature source Oral, resp. rate 18, height 5\' 1"  (1.549 m), weight 160 lb 12.8 oz (72.938 kg), SpO2 98.00%.  Filed Weights   01/12/13 1130  01/13/13 0500  Weight: 161 lb 1.6 oz (73.074 kg) 160 lb 12.8 oz (72.938 kg)   Labs  CBC  Recent Labs  01/12/13 1646 01/13/13 0425 01/13/13 2011  WBC 8.0 7.7  --   HGB 11.4* 10.6* 10.0*  HCT 34.6* 32.0* 29.7*  MCV 87.6 87.7  --   PLT 224 175  --    Basic Metabolic Panel  Recent Labs  01/13/13 0425 01/14/13 0710  NA 140 140  K 4.0 4.5  CL 107 106  CO2 20 25  GLUCOSE 110* 121*  BUN 21 19  CREATININE 1.20* 1.23*  CALCIUM 9.7 10.0   Disposition  Pt is being discharged home today in good condition.  Follow-up Plans & Appointments      Follow-up Information   Follow up with Tereso Newcomer, PA-C On 02/01/2013. (11:30 AM)    Contact information:   [address] [place] [address] [phone]       Follow up with [facility] On 01/18/2013. (Nurse Visit - 10:00 AM)    Contact information:   [address] [place] [address] [phone]     Discharge Medications    Medication List  ALKA-SELTZER PLS NIGHT CLD/FLU PO  Take 1 capsule by mouth daily as needed.     aspirin 81 MG tablet  Take 81 mg by mouth daily.     atorvastatin 80 MG tablet  Commonly known as:  LIPITOR  Take 1 tablet (80 mg total) by mouth daily.     benazepril 20 MG tablet  Commonly known as:  LOTENSIN  Take 1 tablet (20 mg total) by mouth at bedtime.     CALTRATE 600 PO  Take by mouth daily.     gabapentin 300 MG capsule  Commonly known as:  NEURONTIN  Take 300-600 mg by mouth at bedtime.     hydrochlorothiazide 25 MG tablet  Commonly known as:  HYDRODIURIL  Take 1 tablet (25 mg total) by mouth daily.     ibuprofen 200 MG tablet  Commonly known as:  ADVIL,MOTRIN  Take 200 mg by mouth every 6 (six) hours as needed for pain or headache (headache).     isosorbide mononitrate 30 MG 24 hr tablet  Commonly known as:  IMDUR  Take 1 tablet (30 mg total) by mouth daily.     NITROSTAT 0.3 MG SL tablet  Generic drug:  nitroGLYCERIN    Place 0.3 mg under the tongue every 5 (five) minutes as needed.     timolol 0.5 % ophthalmic solution  Commonly known as:  TIMOPTIC  Place 1 drop into both eyes 2 (two) times daily.     Vitamin D (Ergocalciferol) 50000 UNITS Caps  Commonly known as:  DRISDOL  Take 50,000 Units by mouth every 7 (seven) days.     zolpidem 5 MG tablet  Commonly known as:  AMBIEN  Take 5 mg by mouth at bedtime as needed for sleep (sleep).       Outstanding Labs/Studies  None  Duration of Discharge Encounter   Greater than 30 minutes including physician time.  Signed, Nicolasa Ducking NP 01/14/2013, 10:18 AM   Patient seen and examined.  Plan as discussed in my rounding note for today and outlined above. Fayrene Fearing [facility]  01/14/2013  10:55 AM

## 2013-01-18 ENCOUNTER — Ambulatory Visit (INDEPENDENT_AMBULATORY_CARE_PROVIDER_SITE_OTHER): Payer: Medicare Other | Admitting: *Deleted

## 2013-01-18 VITALS — BP 138/62 | HR 60

## 2013-01-18 DIAGNOSIS — I2510 Atherosclerotic heart disease of native coronary artery without angina pectoris: Secondary | ICD-10-CM

## 2013-01-18 NOTE — Progress Notes (Signed)
Right groin check/ post cath hematoma. Pt states the site is not painful anymore. Bruising from top of groin down to mid/upper thigh anteriorly, bruise includes entire mons pubis to hip bone following the groin ligament.  Bruising is purple/ small amounts of yellow, site dry/ no oozing, no redness. Two prior marker drawings are seen, the larger one is accurate to current palpation. 8.5" width x 3"length Pt denies back pain, pt told no bath for one week, call with questions or concerns. Dr Antoine Poche palpated site, no further advice.

## 2013-01-26 ENCOUNTER — Other Ambulatory Visit: Payer: Self-pay | Admitting: *Deleted

## 2013-01-26 MED ORDER — HYDROCHLOROTHIAZIDE 25 MG PO TABS: 25.0000 mg | ORAL_TABLET | Freq: Every day | ORAL | Status: DC

## 2013-02-01 ENCOUNTER — Encounter: Payer: Self-pay | Admitting: Physician Assistant

## 2013-02-01 ENCOUNTER — Ambulatory Visit (INDEPENDENT_AMBULATORY_CARE_PROVIDER_SITE_OTHER): Payer: Medicare Other | Admitting: Physician Assistant

## 2013-02-01 VITALS — BP 140/70 | HR 59 | Ht 61.0 in | Wt 160.0 lb

## 2013-02-01 DIAGNOSIS — D649 Anemia, unspecified: Secondary | ICD-10-CM

## 2013-02-01 DIAGNOSIS — I2510 Atherosclerotic heart disease of native coronary artery without angina pectoris: Secondary | ICD-10-CM

## 2013-02-01 DIAGNOSIS — N189 Chronic kidney disease, unspecified: Secondary | ICD-10-CM

## 2013-02-01 DIAGNOSIS — I359 Nonrheumatic aortic valve disorder, unspecified: Secondary | ICD-10-CM

## 2013-02-01 DIAGNOSIS — I350 Nonrheumatic aortic (valve) stenosis: Secondary | ICD-10-CM

## 2013-02-01 DIAGNOSIS — I25810 Atherosclerosis of coronary artery bypass graft(s) without angina pectoris: Secondary | ICD-10-CM

## 2013-02-01 DIAGNOSIS — T148XXA Other injury of unspecified body region, initial encounter: Secondary | ICD-10-CM

## 2013-02-01 DIAGNOSIS — I77810 Thoracic aortic ectasia: Secondary | ICD-10-CM

## 2013-02-01 DIAGNOSIS — I10 Essential (primary) hypertension: Secondary | ICD-10-CM

## 2013-02-01 DIAGNOSIS — E7800 Pure hypercholesterolemia, unspecified: Secondary | ICD-10-CM

## 2013-02-01 DIAGNOSIS — R079 Chest pain, unspecified: Secondary | ICD-10-CM

## 2013-02-01 LAB — CBC WITH DIFFERENTIAL/PLATELET
Basophils Relative: 1.1 % (ref 0.0–3.0)
Eosinophils Relative: 2.5 % (ref 0.0–5.0)
HCT: 31.4 % — ABNORMAL LOW (ref 36.0–46.0)
MCV: 89.8 fl (ref 78.0–100.0)
Monocytes Absolute: 0.4 10*3/uL (ref 0.1–1.0)
Monocytes Relative: 4.6 % (ref 3.0–12.0)
Neutrophils Relative %: 60.4 % (ref 43.0–77.0)
RBC: 3.49 Mil/uL — ABNORMAL LOW (ref 3.87–5.11)
WBC: 7.7 10*3/uL (ref 4.5–10.5)

## 2013-02-01 LAB — BASIC METABOLIC PANEL
Chloride: 106 mEq/L (ref 96–112)
Creatinine, Ser: 1.4 mg/dL — ABNORMAL HIGH (ref 0.4–1.2)
GFR: 38.96 mL/min — ABNORMAL LOW (ref >60.00)
Potassium: 4 mEq/L (ref 3.5–5.1)

## 2013-02-01 MED ORDER — OMEPRAZOLE 20 MG PO CPDR: 20.0000 mg | DELAYED_RELEASE_CAPSULE | Freq: Every day | ORAL | Status: DC

## 2013-02-01 NOTE — Patient Instructions (Addendum)
Your physician wants you to follow-up in:  3 months with Dr. Antoine Poche. You will receive a reminder letter in the mail two months in advance. If you don't receive a letter, please call our office to schedule the follow-up appointment.  Your physician has recommended you make the following change in your medication:  Start Prilosec 20 mg by mouth daily for 3-4 weeks.

## 2013-02-01 NOTE — Progress Notes (Signed)
[address], [address] [place], [place]  [address] Phone: [phone] Fax:  [phone]  Date:  02/01/2013   ID:  [name], DOB 1933/05/10, MRN [id number]  PCP:  Tarri Fuller, MD  Primary Cardiologist:  Dr. [name]     History of Present Illness: [name] is a 79 y.o. female who returns for f/u after a recent heart cath.  She has a hx of CAD, aortic stenosis, HTN, HL, glaucoma, LBBB. She underwent CABG (LIMA-LAD, SVG-RCA, SVG-OM1/OM2) + pericardial tissue AVR in 07/2007. Her prosthetic aortic valve gradient has been noted to be increased over the last couple of years.  Myoview 06/2011: No ischemia, EF 67%. Echocardiogram 04/2012: EF 55-60%, moderate aortic stenosis (mean gradient 34), mild AI, mild LAE, PASP 38.  Carotid U/S 5/13:  bilat 40-59%.    I saw her recently in f/u and she noted right arm pain radiating up to her neck like prior angina which occurred while walking and resolved with rest.   Echo 12/07/12: mild LVH, mild FBSH, EF 55-60%, Gr 2 DD, mod AS, mean 36 mmHg, mild AI, PASP 44, mild to mod dilated asc aorta.   Initially, we thought she was describing stable CCS Class II angina.  We therefore attempted an initial trial of medical Rx.  She f/u with Dr. [name] and noted continued CP felt to represent Class III angina.  She was therefore set up with cardiac cath.  R+ L HC 01/13/13:  RA 6, RV 48/3, PA 39/16, mean 27, PCWP 18, AVA 0.92, AV gradient 21, CO 4.53, CI 2.63, S-RCA ok, S-OM1/OM2 ok, L-LAD ok (mod AS and patent grafts).  Med Rx recommended.  She was observed overnight and received pre and post hydration due to her CKD.  Creatinine remained stable.  She did have a R groin hematoma post cath but no evidence of pseudoaneurysm.  Hgb remained fairly stable.  She did see the RN on 7/15 along with Dr. Antoine Poche and R groin site was stable.   She continues to note occasional CP.  She took antacids the other night with relief.  Also continues to  note exertional L arm pain.  No change in symptoms.  No syncope.  No orthopnea, PND, edema.  No dysphagia, melena, hematochezia, weight loss, vomiting.    Labs (6/12):  K 4.3, Cr 1.2 Labs (7/14):  K 4, Cr 1.7=>1.23, Hgb 11.1=>10.0    Wt Readings from Last 3 Encounters:  02/01/13 160 lb (72.576 kg)  01/13/13 160 lb 12.8 oz (72.938 kg)  01/13/13 160 lb 12.8 oz (72.938 kg)     Past Medical History  Diagnosis Date  . Aortic stenosis     a.  s/p tissue AVR at time of CABG in 2009;  b. Echo 04/2012: EF 55-60%, moderate AS (mean 34);  c. Echo 6/14: Mild LVH, mild focal basal septal hypertrophy, EF 55-60%, normal wall motion, grade 2 diastolic dysfunction, AVR with moderate aortic stenosis (mean 36) - consider TEE if clinically indicated, mild AI, mild MR, PASP 44, ascending aorta mild to moderately dilated (consider CTA or MRA)  . Dyslipidemia   . Hypertension   . Spinal arthritis   . CAD (coronary artery disease)     a. s/p CABG; b. Myoview 06/2011: No ischemia, EF 67%;  c. 01/2013 Cath: LM min irregs, LAD small, LCX 134m OMs ok, RCA known 100, VG->RCA ok, VG->OM1->2 ok, LIMA->LAD ok.  Marland Kitchen Hx of CABG     LIMA-LAD, SVG-RCA, SVG-OM1/OM2  in 2009  . Blindness of right eye     due to retinal bleed  . Glaucoma   . Left bundle branch block   . Carotid stenosis     Carotid U/S 5/13:  bilat 40-59%  . Shortness of breath   . Heart murmur   . Chronic kidney disease     renal insufficiency  . GERD (gastroesophageal reflux disease)   . Arthritis     Current Outpatient Prescriptions  Medication Sig Dispense Refill  . aspirin 81 MG tablet Take 81 mg by mouth daily.        Marland Kitchen atorvastatin (LIPITOR) 80 MG tablet Take 1 tablet (80 mg total) by mouth daily.  90 tablet  3  . benazepril (LOTENSIN) 20 MG tablet Take 1 tablet (20 mg total) by mouth at bedtime.  30 tablet  11  . Calcium Carbonate (CALTRATE 600 PO) Take by mouth daily.        Marland Kitchen gabapentin (NEURONTIN) 300 MG capsule Take 300-600 mg by mouth at  bedtime.      . hydrochlorothiazide (HYDRODIURIL) 25 MG tablet Take 1 tablet (25 mg total) by mouth daily.  90 tablet  3  . ibuprofen (ADVIL,MOTRIN) 200 MG tablet Take 200 mg by mouth every 6 (six) hours as needed for pain or headache (headache).      . isosorbide mononitrate (IMDUR) 30 MG 24 hr tablet Take 1 tablet (30 mg total) by mouth daily.  90 tablet  3  . NITROSTAT 0.3 MG SL tablet Place 0.3 mg under the tongue every 5 (five) minutes as needed.       Marland Kitchen Phenyleph-Doxylamine-DM-APAP (ALKA-SELTZER PLS NIGHT CLD/FLU PO) Take 1 capsule by mouth daily as needed.      . timolol (TIMOPTIC) 0.5 % ophthalmic solution Place 1 drop into both eyes 2 (two) times daily.      . Vitamin D, Ergocalciferol, (DRISDOL) 50000 UNITS CAPS Take 50,000 Units by mouth every 7 (seven) days.        Marland Kitchen zolpidem (AMBIEN) 5 MG tablet Take 5 mg by mouth at bedtime as needed for sleep (sleep).       No current facility-administered medications for this visit.    Allergies:    Allergies  Allergen Reactions  . Zetia (Ezetimibe) Other (See Comments)    DIZZINESS    Social History:  The patient  reports that she has never smoked. She has never used smokeless tobacco. She reports that she does not drink alcohol or use illicit drugs.   ROS:  Please see the history of present illness.   All other systems reviewed and negative.   PHYSICAL EXAM: VS:  BP 140/70  Pulse 59  Ht 5\' 1"  (1.549 m)  Wt 160 lb (72.576 kg)  BMI 30.25 kg/m2 Well nourished, well developed, in no acute distress HEENT: normal Neck: no JVD Cardiac:  normal S1, S2; RRR; 2/6 harsh systolic murmur heard best at the RUSB Lungs:  clear to auscultation bilaterally, no wheezing, rhonchi or rales Abd: soft, nontender, no hepatomegaly Ext: no edema; R groin hematoma - no tenderness, no bruit, no pulsatile mass Skin: warm and dry Neuro:  CNs 2-12 intact, no focal abnormalities noted  EKG:  NSR, HR 59, LBBB, no change from prior tracing     ASSESSMENT  AND PLAN:  1. Aortic Stenosis:  Stable by recent echo.  Moderate by R+L heart cath.  Continue current Rx.   2. Dilated Ascending Aortic:  I reviewed this with Dr. Fayrene Fearing  Hochrein last time I saw her.  We do not think she needs CTA or MRA at this time.  3. CAD:   Patent grafts by recent cath.  Continue ASA and statin.   4. Hypertension:  Controlled. 5. Hyperlipidemia:  Continue statin.   6. Carotid Stenosis:  F/u by [facility]. 7. CKD:  Repeat BMET today.  8. R Groin Hematoma:  Stable.  Repeat CBC to follow up on anemia. 9. Chest Pain:  Question if this is related to GERD.  Trial of Prilosec 20 mg QD for 3-4 weeks.  Further f/u with PCP.  10. Disposition:  F/u with Dr. [name] in 3 mos.   Luna Glasgow, PA-C  11:53 AM 02/01/2013

## 2013-02-02 ENCOUNTER — Telehealth: Payer: Self-pay | Admitting: *Deleted

## 2013-02-02 NOTE — Telephone Encounter (Signed)
pt notified about lab results with verbal understanding , will fax results to PCP

## 2013-02-02 NOTE — Telephone Encounter (Signed)
Message copied by Tarri Fuller on Wed Feb 02, 2013  4:13 PM ------      Message from: [place], [place] T      Created: Tue Feb 01, 2013  9:41 PM       Creatinine stable      Hgb stable      Continue with current treatment plan.      Fax labs to PCP.      Tereso Newcomer, PA-C        02/01/2013 9:41 PM ------

## 2013-02-04 ENCOUNTER — Telehealth: Payer: Self-pay | Admitting: *Deleted

## 2013-02-04 NOTE — Telephone Encounter (Signed)
called Dr. Adela Ports office to let them know a hard copy of lab results being mailed today since fax I sent was clear for them.

## 2013-02-17 ENCOUNTER — Other Ambulatory Visit: Payer: Self-pay | Admitting: Neurosurgery

## 2013-02-17 DIAGNOSIS — M5412 Radiculopathy, cervical region: Secondary | ICD-10-CM

## 2013-03-01 ENCOUNTER — Ambulatory Visit
Admission: RE | Admit: 2013-03-01 | Discharge: 2013-03-01 | Disposition: A | Discharge status: Home / Self Care | Payer: Medicare Other | Source: Ambulatory Visit | Attending: Neurosurgery | Admitting: Neurosurgery

## 2013-03-01 DIAGNOSIS — M5412 Radiculopathy, cervical region: Secondary | ICD-10-CM

## 2013-05-10 ENCOUNTER — Encounter: Payer: Self-pay | Admitting: Cardiology

## 2013-05-10 ENCOUNTER — Ambulatory Visit (INDEPENDENT_AMBULATORY_CARE_PROVIDER_SITE_OTHER): Payer: Medicare Other | Admitting: Cardiology

## 2013-05-10 VITALS — BP 120/70 | HR 58 | Ht 61.0 in | Wt 156.0 lb

## 2013-05-10 DIAGNOSIS — E7800 Pure hypercholesterolemia, unspecified: Secondary | ICD-10-CM

## 2013-05-10 DIAGNOSIS — I2510 Atherosclerotic heart disease of native coronary artery without angina pectoris: Secondary | ICD-10-CM

## 2013-05-10 DIAGNOSIS — I200 Unstable angina: Secondary | ICD-10-CM

## 2013-05-10 LAB — LIPID PANEL
LDL Cholesterol: 98 mg/dL (ref 0–99)
Total CHOL/HDL Ratio: 5
Triglycerides: 198 mg/dL — ABNORMAL HIGH (ref 0.0–149.0)
VLDL: 39.6 mg/dL (ref 0.0–40.0)

## 2013-05-10 NOTE — Patient Instructions (Signed)
The current medical regimen is effective;  continue present plan and medications.  Please have blood work today (lipid)  Follow up in 6 months with Dr Antoine Poche.  You will receive a letter in the mail 2 months before you are due.  Please call us when you receive this letter to schedule your follow up appointment.

## 2013-05-10 NOTE — Progress Notes (Signed)
HPI    She has been having increasing symptoms of arm and chest pain.  We elected to manage her medically.  She did not respond to treatment with Imdur.  She had a cardiac catheterization that demonstrated patent bypass grafts in July. She continues to get the same pain. This is described extensively in previous notes. Any time she does activities with her arm or even walks she's getting some discomfort in her shoulder. She thinks it's similar to previous angina. Been a stable pattern since her stress test December 2012 and her catheterization in July.  She has had no new cardiovascular complaints. She does have some dyspnea but no PND or orthopnea. She's had no new palpitations, presyncope or syncope. She's had no new weight gain or edema.  Allergies  Allergen Reactions  . Zetia [Ezetimibe] Other (See Comments)    DIZZINESS    Current Outpatient Prescriptions  Medication Sig Dispense Refill  . aspirin 81 MG tablet Take 81 mg by mouth daily.        Marland Kitchen atorvastatin (LIPITOR) 80 MG tablet Take 1 tablet (80 mg total) by mouth daily.  90 tablet  3  . benazepril (LOTENSIN) 20 MG tablet Take 1 tablet (20 mg total) by mouth at bedtime.  30 tablet  11  . Calcium Carbonate (CALTRATE 600 PO) Take by mouth daily.        Marland Kitchen gabapentin (NEURONTIN) 300 MG capsule Take 300-600 mg by mouth at bedtime.      . hydrochlorothiazide (HYDRODIURIL) 25 MG tablet Take 1 tablet (25 mg total) by mouth daily.  90 tablet  3  . ibuprofen (ADVIL,MOTRIN) 200 MG tablet Take 200 mg by mouth every 6 (six) hours as needed for pain or headache (headache).      . isosorbide mononitrate (IMDUR) 30 MG 24 hr tablet Take 1 tablet (30 mg total) by mouth daily.  90 tablet  3  . NITROSTAT 0.3 MG SL tablet Place 0.3 mg under the tongue every 5 (five) minutes as needed.       Marland Kitchen omeprazole (PRILOSEC) 20 MG capsule Take 1 capsule (20 mg total) by mouth daily.  30 capsule  1  . Phenyleph-Doxylamine-DM-APAP (ALKA-SELTZER PLS NIGHT CLD/FLU PO)  Take 1 capsule by mouth daily as needed.      . timolol (TIMOPTIC) 0.5 % ophthalmic solution Place 1 drop into both eyes 2 (two) times daily.      . Vitamin D, Ergocalciferol, (DRISDOL) 50000 UNITS CAPS Take 50,000 Units by mouth every 7 (seven) days.        Marland Kitchen zolpidem (AMBIEN) 5 MG tablet Take 5 mg by mouth at bedtime as needed for sleep (sleep).       No current facility-administered medications for this visit.    Past Medical History  Diagnosis Date  . Aortic stenosis     a.  s/p tissue AVR at time of CABG in 2009;  b. Echo 04/2012: EF 55-60%, moderate AS (mean 34);  c. Echo 6/14: Mild LVH, mild focal basal septal hypertrophy, EF 55-60%, normal wall motion, grade 2 diastolic dysfunction, AVR with moderate aortic stenosis (mean 36) - consider TEE if clinically indicated, mild AI, mild MR, PASP 44, ascending aorta mild to moderately dilated (consider CTA or MRA)  . Dyslipidemia   . Hypertension   . Spinal arthritis   . CAD (coronary artery disease)     a. s/p CABG; b. Myoview 06/2011: No ischemia, EF 67%;  c. 01/2013 Cath: LM min irregs, LAD  small, LCX 131m OMs ok, RCA known 100, VG->RCA ok, VG->OM1->2 ok, LIMA->LAD ok.  Marland Kitchen Hx of CABG     LIMA-LAD, SVG-RCA, SVG-OM1/OM2 in 2009  . Blindness of right eye     due to retinal bleed  . Glaucoma   . Left bundle branch block   . Carotid stenosis     Carotid U/S 5/13:  bilat 40-59%  . Shortness of breath   . Heart murmur   . Chronic kidney disease     renal insufficiency  . GERD (gastroesophageal reflux disease)   . Arthritis     Past Surgical History  Procedure Laterality Date  . Coronary artery bypass graft  Jan 2009    LIMA to LAD, SVG to RCA, SVG to OM 1 & 2  . Aortic valve replacement  Jan 2009    #21 mm pericardial prosthesis  . Back surgery    . Neck surgery    . Abdominal hysterectomy    . Cholecystectomy    . Eye surgery      ROS: As stated in the HPI and negative for all other systems.  PHYSICAL EXAM BP 120/70  Pulse  58  Ht 5\' 1"  (1.549 m)  Wt 156 lb (70.761 kg)  BMI 29.49 kg/m2 GENERAL:  Well appearing HEENT: Right pupil fixed and lens opacified. Dentures.  NECK:  No jugular venous distention, waveform within normal limits, carotid upstroke brisk and symmetric, no bruits, no thyromegaly LYMPHATICS:  No cervical, inguinal adenopathy LUNGS:  Clear to auscultation bilaterally BACK:  No CVA tenderness CHEST:  Well healed sternotomy scar. HEART:  PMI not displaced or sustained,S1 and S2 within normal limits, no S3, no S4, no clicks, no rubs, mid peaking systolic murmur radiating out the outflow tract ABD:  Flat, positive bowel sounds normal in frequency in pitch, no bruits, no rebound, no guarding, no midline pulsatile mass, no hepatomegaly, no splenomegaly EXT:  2 plus pulses throughout, no edema, no cyanosis no clubbing  EKG:  Sinus rhythm, rate 58, interventricular conduction delay, premature ventricular contractions, no acute ST-T wave changes. No change from previous. 05/10/2013  ASSESSMENT AND PLAN  CAD (coronary artery disease) -  We had a long discussion I reviewed the catheterization and the last stress test in 2012 December. There has been no evidence that this ongoing pain is coming from a cardiac etiology. She still reluctant to believe this even though she has been given alternative diagnosis of cervical disc disease causing her shoulder and arm pain. I don't see any indication for further cardiovascular testing. She will continue medications and risk reduction as above.  Hypertension -  Her blood pressure is at target. No change in medications is indicated. We will continue with therapeutic lifestyle changes (TLC).   S/P aortic valve replacement with bioprosthetic valve -  This was moderate at the time of catheterization and I will followup in July of next year with a repeat echocardiogram.  Dyslipidemia -  I will check a lipid profile today.   Carotid stenosis - She has nonobstructive  carotid stenosis followed by  [facility]. I reviewed these results and this was mild.

## 2013-06-13 DIAGNOSIS — M542 Cervicalgia: Secondary | ICD-10-CM | POA: Insufficient documentation

## 2013-08-02 ENCOUNTER — Other Ambulatory Visit: Payer: Self-pay | Admitting: Cardiology

## 2013-08-10 DIAGNOSIS — M5412 Radiculopathy, cervical region: Secondary | ICD-10-CM | POA: Insufficient documentation

## 2013-08-16 ENCOUNTER — Other Ambulatory Visit: Payer: Self-pay | Admitting: Vascular Surgery

## 2013-08-16 DIAGNOSIS — I6529 Occlusion and stenosis of unspecified carotid artery: Secondary | ICD-10-CM

## 2013-08-18 ENCOUNTER — Other Ambulatory Visit: Payer: Self-pay | Admitting: Neurosurgery

## 2013-08-18 DIAGNOSIS — M5412 Radiculopathy, cervical region: Secondary | ICD-10-CM

## 2013-08-19 ENCOUNTER — Ambulatory Visit
Admission: RE | Admit: 2013-08-19 | Discharge: 2013-08-19 | Disposition: A | Discharge status: Home / Self Care | Payer: Medicare HMO | Source: Ambulatory Visit | Attending: Neurosurgery | Admitting: Neurosurgery

## 2013-08-19 DIAGNOSIS — M5412 Radiculopathy, cervical region: Secondary | ICD-10-CM

## 2013-08-19 MED ORDER — IOHEXOL 300 MG/ML  SOLN
10.0000 mL | Freq: Once | INTRAMUSCULAR | Status: AC | PRN
  Administered 2013-08-19: 10 mL via INTRATHECAL

## 2013-08-19 NOTE — Progress Notes (Signed)
Discharge instructions explained to pt and her husband. jkl rn

## 2013-08-19 NOTE — Discharge Instructions (Signed)

## 2013-09-08 ENCOUNTER — Emergency Department (HOSPITAL_COMMUNITY)
Admission: EM | Admit: 2013-09-08 | Discharge: 2013-09-08 | Disposition: A | Discharge status: Home / Self Care | Payer: Medicare HMO | Attending: Emergency Medicine | Admitting: Emergency Medicine

## 2013-09-08 ENCOUNTER — Encounter (HOSPITAL_COMMUNITY): Payer: Self-pay | Admitting: Emergency Medicine

## 2013-09-08 ENCOUNTER — Emergency Department (HOSPITAL_COMMUNITY): Payer: Medicare HMO

## 2013-09-08 DIAGNOSIS — S0003XA Contusion of scalp, initial encounter: Secondary | ICD-10-CM | POA: Insufficient documentation

## 2013-09-08 DIAGNOSIS — S20229A Contusion of unspecified back wall of thorax, initial encounter: Secondary | ICD-10-CM | POA: Insufficient documentation

## 2013-09-08 DIAGNOSIS — Z79899 Other long term (current) drug therapy: Secondary | ICD-10-CM | POA: Insufficient documentation

## 2013-09-08 DIAGNOSIS — R011 Cardiac murmur, unspecified: Secondary | ICD-10-CM | POA: Insufficient documentation

## 2013-09-08 DIAGNOSIS — M479 Spondylosis, unspecified: Secondary | ICD-10-CM | POA: Insufficient documentation

## 2013-09-08 DIAGNOSIS — X500XXA Overexertion from strenuous movement or load, initial encounter: Secondary | ICD-10-CM | POA: Insufficient documentation

## 2013-09-08 DIAGNOSIS — W1809XA Striking against other object with subsequent fall, initial encounter: Secondary | ICD-10-CM | POA: Insufficient documentation

## 2013-09-08 DIAGNOSIS — H5440 Blindness, one eye, unspecified eye: Secondary | ICD-10-CM | POA: Insufficient documentation

## 2013-09-08 DIAGNOSIS — E785 Hyperlipidemia, unspecified: Secondary | ICD-10-CM | POA: Insufficient documentation

## 2013-09-08 DIAGNOSIS — I2510 Atherosclerotic heart disease of native coronary artery without angina pectoris: Secondary | ICD-10-CM | POA: Insufficient documentation

## 2013-09-08 DIAGNOSIS — W19XXXA Unspecified fall, initial encounter: Secondary | ICD-10-CM

## 2013-09-08 DIAGNOSIS — N189 Chronic kidney disease, unspecified: Secondary | ICD-10-CM | POA: Insufficient documentation

## 2013-09-08 DIAGNOSIS — Y9301 Activity, walking, marching and hiking: Secondary | ICD-10-CM | POA: Insufficient documentation

## 2013-09-08 DIAGNOSIS — S20212A Contusion of left front wall of thorax, initial encounter: Secondary | ICD-10-CM

## 2013-09-08 DIAGNOSIS — Y9289 Other specified places as the place of occurrence of the external cause: Secondary | ICD-10-CM | POA: Insufficient documentation

## 2013-09-08 DIAGNOSIS — Z7982 Long term (current) use of aspirin: Secondary | ICD-10-CM | POA: Insufficient documentation

## 2013-09-08 DIAGNOSIS — Z951 Presence of aortocoronary bypass graft: Secondary | ICD-10-CM | POA: Insufficient documentation

## 2013-09-08 DIAGNOSIS — K219 Gastro-esophageal reflux disease without esophagitis: Secondary | ICD-10-CM | POA: Insufficient documentation

## 2013-09-08 DIAGNOSIS — S1093XA Contusion of unspecified part of neck, initial encounter: Principal | ICD-10-CM

## 2013-09-08 DIAGNOSIS — S0083XA Contusion of other part of head, initial encounter: Secondary | ICD-10-CM

## 2013-09-08 DIAGNOSIS — I129 Hypertensive chronic kidney disease with stage 1 through stage 4 chronic kidney disease, or unspecified chronic kidney disease: Secondary | ICD-10-CM | POA: Insufficient documentation

## 2013-09-08 MED ORDER — HYDROCODONE-ACETAMINOPHEN 5-325 MG PO TABS: 2.0000 | ORAL_TABLET | Freq: Once | ORAL | Status: DC

## 2013-09-08 NOTE — ED Provider Notes (Signed)
CSN: [id number]     Arrival date & time 09/08/13  1111 History   First MD Initiated Contact with Patient 09/08/13 1156     Chief Complaint  Patient presents with  . Fall     (Consider location/radiation/quality/duration/timing/severity/associated sxs/prior Treatment) HPI Patient report 3 days ago she brought her husband to the hospital for her yearly testing due to his history of cancer. When ever going back out to the parking lot she tripped and fell and then her husband fell on top of her. She reports she hit the left side of her face around her eye. She states she is blind in her right eye already. She denies loss of consciousness. She states she heard a pop in her left chest and has had pleuritic type chest pain since. She has shortness of breath sometimes. She states it hurts when she moves or turns and describes the pain as a soreness. She did report yesterday however when she was walking out to the parking lot (her husband was admitted to the hospital) she felt like she couldn't make it.  PCP Dr Watt Climes in [place]  Past Medical History  Diagnosis Date  . Aortic stenosis     a.  s/p tissue AVR at time of CABG in 2009;  b. Echo 04/2012: EF 55-60%, moderate AS (mean 34);  c. Echo 6/14: Mild LVH, mild focal basal septal hypertrophy, EF 55-60%, normal wall motion, grade 2 diastolic dysfunction, AVR with moderate aortic stenosis (mean 36) - consider TEE if clinically indicated, mild AI, mild MR, PASP 44, ascending aorta mild to moderately dilated (consider CTA or MRA)  . Dyslipidemia   . Hypertension   . Spinal arthritis   . CAD (coronary artery disease)     a. s/p CABG; b. Myoview 06/2011: No ischemia, EF 67%;  c. 01/2013 Cath: LM min irregs, LAD small, LCX 174m OMs ok, RCA known 100, VG->RCA ok, VG->OM1->2 ok, LIMA->LAD ok.  Marland Kitchen Hx of CABG     LIMA-LAD, SVG-RCA, SVG-OM1/OM2 in 2009  . Blindness of right eye     due to retinal bleed  . Glaucoma   . Left bundle branch block   . Carotid  stenosis     Carotid U/S 5/13:  bilat 40-59%  . Shortness of breath   . Heart murmur   . Chronic kidney disease     renal insufficiency  . GERD (gastroesophageal reflux disease)   . Arthritis    Past Surgical History  Procedure Laterality Date  . Coronary artery bypass graft  Jan 2009    LIMA to LAD, SVG to RCA, SVG to OM 1 & 2  . Aortic valve replacement  Jan 2009    #21 mm pericardial prosthesis  . Back surgery    . Neck surgery    . Abdominal hysterectomy    . Cholecystectomy    . Eye surgery     Family History  Problem Relation Age of Onset  . Cancer Mother   . Heart attack Father   . Heart attack Brother    History  Substance Use Topics  . Smoking status: Never Smoker   . Smokeless tobacco: Never Used  . Alcohol Use: No   Lives with spouse  OB History   Grav Para Term Preterm Abortions TAB SAB Ect Mult Living                 Review of Systems  All other systems reviewed and are negative.  Allergies  Zetia  Home Medications   Current Outpatient Rx  Name  Route  Sig  Dispense  Refill  . aspirin 81 MG tablet   Oral   Take 81 mg by mouth daily.           Marland Kitchen atorvastatin (LIPITOR) 80 MG tablet      TAKE 1 TABLET BY MOUTH EVERY DAY FOR CHOLESTEROL   90 tablet   1   . benazepril (LOTENSIN) 20 MG tablet   Oral   Take 1 tablet (20 mg total) by mouth at bedtime.   30 tablet   11   . Calcium Carbonate (CALTRATE 600 PO)   Oral   Take by mouth daily.           Marland Kitchen gabapentin (NEURONTIN) 300 MG capsule   Oral   Take 300-600 mg by mouth at bedtime.         . hydrochlorothiazide (HYDRODIURIL) 25 MG tablet   Oral   Take 1 tablet (25 mg total) by mouth daily.   90 tablet   3   . ibuprofen (ADVIL,MOTRIN) 200 MG tablet   Oral   Take 200 mg by mouth every 6 (six) hours as needed for pain or headache (headache).         . isosorbide mononitrate (IMDUR) 30 MG 24 hr tablet   Oral   Take 1 tablet (30 mg total) by mouth daily.   90  tablet   3   . NITROSTAT 0.3 MG SL tablet   Sublingual   Place 0.3 mg under the tongue every 5 (five) minutes as needed.          Marland Kitchen omeprazole (PRILOSEC) 20 MG capsule   Oral   Take 1 capsule (20 mg total) by mouth daily.   30 capsule   1   . Phenyleph-Doxylamine-DM-APAP (ALKA-SELTZER PLS NIGHT CLD/FLU PO)   Oral   Take 1 capsule by mouth daily as needed.         . timolol (TIMOPTIC) 0.5 % ophthalmic solution   Both Eyes   Place 1 drop into both eyes 2 (two) times daily.         . Vitamin D, Ergocalciferol, (DRISDOL) 50000 UNITS CAPS   Oral   Take 50,000 Units by mouth every 7 (seven) days.           Marland Kitchen zolpidem (AMBIEN) 5 MG tablet   Oral   Take 5 mg by mouth at bedtime as needed for sleep (sleep).          BP 138/54  Pulse 70  Temp(Src) 97.8 F (36.6 C) (Oral)  Resp 20  SpO2 95%  Vital signs normal   Physical Exam  Nursing note and vitals reviewed. Constitutional: She is oriented to person, place, and time. She appears well-developed and well-nourished.  Non-toxic appearance. She does not appear ill. No distress.  HENT:  Head: Normocephalic. Head is with contusion.    Right Ear: External ear normal.  Left Ear: External ear normal.  Nose: Nose normal. No mucosal edema or rhinorrhea.  Mouth/Throat: Oropharynx is clear and moist and mucous membranes are normal. No dental abscesses or uvula swelling.  Patient has a linear contusion over the bridge of her nose without moderate swelling. She's also noted to have bruising of her upper and lower eyelid on the left. She has some swelling of her left eyebrow and around her upper and lower eyelids. She's tender to palpation over the superior orbital rim and  the inferior orbital rim. She has no obvious entrapment of cranial nerves 6.  Eyes: Conjunctivae and EOM are normal. Pupils are equal, round, and reactive to light.  Neck: Normal range of motion and full passive range of motion without pain. Neck supple.   Cardiovascular: Normal rate, regular rhythm and normal heart sounds.  Exam reveals no gallop and no friction rub.   No murmur heard. Pulmonary/Chest: Effort normal and breath sounds normal. No respiratory distress. She has no wheezes. She has no rhonchi. She has no rales. She exhibits tenderness. She exhibits no crepitus.    Patient tender in the left lower chest wall without crepitance, bruising, or swelling.  Abdominal: Soft. Normal appearance and bowel sounds are normal. She exhibits no distension. There is no tenderness. There is no rebound and no guarding.  Musculoskeletal: Normal range of motion. She exhibits no edema and no tenderness.  Moves all extremities well.   Neurological: She is alert and oriented to person, place, and time. She has normal strength. No cranial nerve deficit.  Skin: Skin is warm, dry and intact. No rash noted. No erythema. No pallor.  Psychiatric: She has a normal mood and affect. Her speech is normal and behavior is normal. Her mood appears not anxious.       ED Course  Procedures (including critical care time)  Medications  HYDROcodone-acetaminophen (NORCO/VICODIN) 5-325 MG per tablet 2 tablet (not administered)   Pt refused pain medications.   Labs Review Labs Reviewed - No data to display Imaging Review Dg Ribs Unilateral W/chest Left  09/08/2013   CLINICAL DATA:  Fall.  Left chest pain  EXAM: LEFT RIBS AND CHEST - 3+ VIEW  COMPARISON:  10/06/2007  FINDINGS: Cardiac enlargement with prior CABG. Negative for heart failure. Mild apical scarring bilaterally. Lungs are clear.  Negative for left rib fracture.  IMPRESSION: No acute abnormality.   Electronically Signed   By: Marlan Palauharles  Clark M.D.   On: 09/08/2013 12:51   Ct Head Wo Contrast CT Maxillofacial Wo Contrast  09/08/2013   CLINICAL DATA:  Fall, injury to the left eye  EXAM: CT HEAD WITHOUT CONTRAST  CT maxillofacial WITHOUT CONTRAST  TECHNIQUE: Multidetector CT imaging of the head and cervical  spine was performed following the standard protocol without intravenous contrast. Multiplanar CT image reconstructions of the facial bones were also generated.  COMPARISON:  CT C SPINE W/CM dated 08/19/2013  FINDINGS: CT HEAD FINDINGS  No intracranial hemorrhage. No parenchymal contusion. No midline shift or mass effect. Basilar cisterns are patent. No skull base fracture. No fluid in the paranasal sinuses or mastoid air cells. Orbits are normal. There is extensive periventricular and subcortical white matter hypodensities  No evidence of orbital injury. No skullbase fracture. The fluid appeared sinuses.  CT maxillofacial FINDINGS  No evidence of orbital wall fracture. Intraconal contents are normal. Globes are normal. No fluid in the paranasal sinuses. No skull fracture. Nasal bones are intact. Zygomatic arches are intact. Mandibular condyles are located. No evidence of mandible fracture.  IMPRESSION: 1. No intracranial trauma. 2. Chronic atrophy and microvascular disease. 3. No facial bone fracture.   Electronically Signed   By: Genevive BiStewart  Edmunds M.D.   On: 09/08/2013 13:54     EKG Interpretation None      MDM   Final diagnoses:  Contusion of face  Fall  Contusion of left chest wall    Plan discharge   Devoria AlbeIva Yonael Tulloch, MD, Armando GangFACEP     Ward GivensIva L Allard Lightsey, MD 09/08/13 1434

## 2013-09-08 NOTE — ED Notes (Signed)
Pt refused pain med.

## 2013-09-08 NOTE — Discharge Instructions (Signed)
Ice packs to the injured areas. You can take ibuprofen or acetaminophen for pain. Return to the ED if you have any problems listed on the head injury sheet, you get a cough, fever or struggle to breathe.

## 2013-09-08 NOTE — ED Notes (Signed)
Per pt, states she tripped and fell on left side-facial abrasion, left black eye-left rib pain

## 2013-10-25 ENCOUNTER — Ambulatory Visit (INDEPENDENT_AMBULATORY_CARE_PROVIDER_SITE_OTHER): Payer: Medicare HMO | Admitting: Cardiology

## 2013-10-25 ENCOUNTER — Ambulatory Visit: Payer: Medicare HMO | Admitting: Cardiology

## 2013-10-25 ENCOUNTER — Encounter: Payer: Self-pay | Admitting: Cardiology

## 2013-10-25 VITALS — BP 162/74 | HR 63 | Ht 61.0 in | Wt 155.0 lb

## 2013-10-25 DIAGNOSIS — I359 Nonrheumatic aortic valve disorder, unspecified: Secondary | ICD-10-CM

## 2013-10-25 DIAGNOSIS — I351 Nonrheumatic aortic (valve) insufficiency: Secondary | ICD-10-CM

## 2013-10-25 DIAGNOSIS — Z952 Presence of prosthetic heart valve: Secondary | ICD-10-CM

## 2013-10-25 DIAGNOSIS — Z953 Presence of xenogenic heart valve: Secondary | ICD-10-CM

## 2013-10-25 DIAGNOSIS — I2510 Atherosclerotic heart disease of native coronary artery without angina pectoris: Secondary | ICD-10-CM

## 2013-10-25 NOTE — Progress Notes (Signed)
HPI    She has been having increasing symptoms of arm and chest pain.  She had a cath last year demonstrating native 3 vessel disease but patent grafts.  We elected to manage her medically.  She did not respond to treatment with Imdur.  She has had continued arm pain.  She is now considering redo neck surgery for apparent radicular pain.  She denies any new pain.  The patient denies any new symptoms such as chest discomfort, neck or arm discomfort. There has been no new shortness of breath, PND or orthopnea. There have been no reported palpitations, presyncope or syncope.  Allergies  Allergen Reactions  . Zetia [Ezetimibe] Other (See Comments)    DIZZINESS    Current Outpatient Prescriptions  Medication Sig Dispense Refill  . aspirin 81 MG tablet Take 81 mg by mouth daily.        Marland Kitchen. atorvastatin (LIPITOR) 80 MG tablet TAKE 1 TABLET BY MOUTH EVERY DAY FOR CHOLESTEROL  90 tablet  1  . benazepril (LOTENSIN) 20 MG tablet Take 20 mg by mouth daily.      . Calcium Carbonate (CALTRATE 600 PO) Take by mouth daily.        Marland Kitchen. gabapentin (NEURONTIN) 300 MG capsule Take 300-600 mg by mouth at bedtime.      . hydrochlorothiazide (HYDRODIURIL) 25 MG tablet Take 1 tablet (25 mg total) by mouth daily.  90 tablet  3  . ibuprofen (ADVIL,MOTRIN) 200 MG tablet Take 200 mg by mouth every 6 (six) hours as needed for pain or headache (headache).      . isosorbide mononitrate (IMDUR) 30 MG 24 hr tablet Take 1 tablet (30 mg total) by mouth daily.  90 tablet  3  . NITROSTAT 0.3 MG SL tablet Place 0.3 mg under the tongue every 5 (five) minutes as needed.       Marland Kitchen. omeprazole (PRILOSEC) 20 MG capsule Take 1 capsule (20 mg total) by mouth daily.  30 capsule  1  . Phenyleph-Doxylamine-DM-APAP (ALKA-SELTZER PLS NIGHT CLD/FLU PO) Take 1 capsule by mouth daily as needed.      . timolol (TIMOPTIC) 0.5 % ophthalmic solution Place 1 drop into both eyes 2 (two) times daily.      . Vitamin D, Ergocalciferol, (DRISDOL) 50000  UNITS CAPS Take 50,000 Units by mouth every 7 (seven) days.        Marland Kitchen. zolpidem (AMBIEN) 5 MG tablet Take 5 mg by mouth at bedtime as needed for sleep (sleep).       No current facility-administered medications for this visit.    Past Medical History  Diagnosis Date  . Aortic stenosis     a.  s/p tissue AVR at time of CABG in 2009;  b. Echo 04/2012: EF 55-60%, moderate AS (mean 34);  c. Echo 6/14: Mild LVH, mild focal basal septal hypertrophy, EF 55-60%, normal wall motion, grade 2 diastolic dysfunction, AVR with moderate aortic stenosis (mean 36) - consider TEE if clinically indicated, mild AI, mild MR, PASP 44, ascending aorta mild to moderately dilated (consider CTA or MRA)  . Dyslipidemia   . Hypertension   . Spinal arthritis   . CAD (coronary artery disease)     a. s/p CABG; b. Myoview 06/2011: No ischemia, EF 67%;  c. 01/2013 Cath: LM min irregs, LAD small, LCX 11751m OMs ok, RCA known 100, VG->RCA ok, VG->OM1->2 ok, LIMA->LAD ok.  Marland Kitchen. Hx of CABG     LIMA-LAD, SVG-RCA, SVG-OM1/OM2 in 2009  .  Blindness of right eye     due to retinal bleed  . Glaucoma   . Left bundle branch block   . Carotid stenosis     Carotid U/S 5/13:  bilat 40-59%  . Shortness of breath   . Heart murmur   . Chronic kidney disease     renal insufficiency  . GERD (gastroesophageal reflux disease)   . Arthritis     Past Surgical History  Procedure Laterality Date  . Coronary artery bypass graft  Jan 2009    LIMA to LAD, SVG to RCA, SVG to OM 1 & 2  . Aortic valve replacement  Jan 2009    #21 mm pericardial prosthesis  . Back surgery    . Neck surgery    . Abdominal hysterectomy    . Cholecystectomy    . Eye surgery      ROS: As stated in the HPI and negative for all other systems.  PHYSICAL EXAM BP 162/74  Pulse 63  Ht 5\' 1"  (1.549 m)  Wt 155 lb (70.308 kg)  BMI 29.30 kg/m2 GENERAL:  Well appearing HEENT: Right pupil fixed and lens opacified. Dentures.  NECK:  No jugular venous distention,  waveform within normal limits, carotid upstroke brisk and symmetric, no bruits, no thyromegaly LYMPHATICS:  No cervical, inguinal adenopathy LUNGS:  Clear to auscultation bilaterally BACK:  No CVA tenderness CHEST:  Well healed sternotomy scar. HEART:  PMI not displaced or sustained,S1 and S2 within normal limits, no S3, no S4, no clicks, no rubs, mid peaking systolic murmur radiating out the outflow tract, 2/6 diastolic murmur, late peaking and radiating heart at the left 4th intercostal space.  ABD:  Flat, positive bowel sounds normal in frequency in pitch, no bruits, no rebound, no guarding, no midline pulsatile mass, no hepatomegaly, no splenomegaly EXT:  2 plus pulses throughout, no edema, no cyanosis no clubbing  EKG:  Sinus rhythm, rate 63, interventricular conduction delay, premature ventricular contractions, no acute ST-T wave changes. No change from previous. 10/25/2013  ASSESSMENT AND PLAN  PREOP - Therefore, based on ACC/AHA guidelines, the patient would be at acceptable risk for the planned procedure without further cardiovascular testing.  CAD (coronary artery disease) -  The patient has no new sypmtoms.  No further cardiovascular testing is indicated.  We will continue with aggressive risk reduction and meds as listed.    Hypertension -  Her blood pressure is slightly elevated.  However, at home it is well controlled.   No change in medications is indicated. We will continue with therapeutic lifestyle changes (TLC).   S/P aortic valve replacement with bioprosthetic valve -  She has mild AS and AI.  I will follow up an echo in [place]  Dyslipidemia -  Her last LDL was less than 100.  She will continue the meds as listed.   Carotid stenosis - She has nonobstructive carotid stenosis followed by  [facility]. I reviewed these results and this was mild.

## 2013-10-25 NOTE — Patient Instructions (Signed)
The current medical regimen is effective;  continue present plan and medications.  You have been cleared for surgery.  Your physician has requested that you have an echocardiogram. Echocardiography is a painless test that uses sound waves to create images of your heart. It provides your doctor with information about the size and shape of your heart and how well your heart's chambers and valves are working. This procedure takes approximately one hour. There are no restrictions for this procedure.  Follow up in 6 months with Dr Antoine PocheHochrein.  You will receive a letter in the mail 2 months before you are due.  Please call us when you receive this letter to schedule your follow up appointment.'

## 2013-10-31 ENCOUNTER — Encounter (HOSPITAL_COMMUNITY): Payer: Self-pay | Admitting: Pharmacy Technician

## 2013-10-31 NOTE — Pre-Procedure Instructions (Signed)
[name]  10/31/2013   Your procedure is scheduled on:  Friday, May 1st  [facility] at 0745 AM.  Call this number if you have problems the morning of surgery: [phone]   Remember:   Do not eat food or drink liquids after midnight.   Take these medicines the morning of surgery with A SIP OF WATER: prilosec, imdur  Stop taking aspirin, over the counter vitamins/herbal medications, NSAIDS as of today   Do not wear jewelry, make-up or nail polish.  Do not wear lotions, powders, or perfumes. You may wear deodorant.  Do not shave 48 hours prior to surgery. Men may shave face and neck.  Do not bring valuables to the hospital.  [facility] is not responsible for any belongings or valuables.               Contacts, dentures or bridgework may not be worn into surgery.  Leave suitcase in the car. After surgery it may be brought to your room.  For patients admitted to the hospital, discharge time is determined by your  treatment team.               Patients discharged the day of surgery will not be allowed to drive home.  Please read over the following fact sheets that you were given: Pain Booklet, Coughing and Deep Breathing, MRSA Information and Surgical Site Infection Prevention [place] - Preparing for Surgery  Before surgery, you can play an important role.  Because skin is not sterile, your skin needs to be as free of germs as possible.  You can reduce the number of germs on you skin by washing with CHG (chlorahexidine gluconate) soap before surgery.  CHG is an antiseptic cleaner which kills germs and bonds with the skin to continue killing germs even after washing.  Please DO NOT use if you have an allergy to CHG or antibacterial soaps.  If your skin becomes reddened/irritated stop using the CHG and inform your nurse when you arrive at Short Stay.  Do not shave (including legs and underarms) for at least 48 hours prior to the first CHG shower.   You may shave your face.  Please follow these instructions carefully:   1.  Shower with CHG Soap the night before surgery and the morning of Surgery.  2.  If you choose to wash your hair, wash your hair first as usual with your normal shampoo.  3.  After you shampoo, rinse your hair and body thoroughly to remove the shampoo.  4.  Use CHG as you would any other liquid soap.  You can apply CHG directly to the skin and wash gently with scrungie or a clean washcloth.  5.  Apply the CHG Soap to your body ONLY FROM THE NECK DOWN.  Do not use on open wounds or open sores.  Avoid contact with your eyes, ears, mouth and genitals (private parts).  Wash genitals (private parts) with your normal soap.  6.  Wash thoroughly, paying special attention to the area where your surgery will be performed.  7.  Thoroughly rinse your body with warm water from the neck down.  8.  DO NOT shower/wash with your normal soap after using and rinsing off the CHG Soap.  9.  Pat yourself dry with a clean towel.            10.  Wear clean pajamas.  11.  Place clean sheets on your bed the night of your first shower and do not sleep with pets.  Day of Surgery  Do not apply any lotions/deoderants the morning of surgery.  Please wear clean clothes to the hospital/surgery center.

## 2013-11-01 ENCOUNTER — Encounter (HOSPITAL_COMMUNITY)
Admission: RE | Admit: 2013-11-01 | Discharge: 2013-11-01 | Disposition: A | Discharge status: Home / Self Care | Payer: Medicare HMO | Source: Ambulatory Visit | Attending: Anesthesiology | Admitting: Anesthesiology

## 2013-11-01 ENCOUNTER — Encounter (HOSPITAL_COMMUNITY): Payer: Self-pay

## 2013-11-01 ENCOUNTER — Encounter (HOSPITAL_COMMUNITY)
Admission: RE | Admit: 2013-11-01 | Discharge: 2013-11-01 | Disposition: A | Discharge status: Home / Self Care | Payer: Medicare HMO | Source: Ambulatory Visit | Attending: Neurosurgery | Admitting: Neurosurgery

## 2013-11-01 HISTORY — DX: Nausea with vomiting, unspecified: R11.2

## 2013-11-01 HISTORY — DX: Other specified postprocedural states: Z98.890

## 2013-11-01 HISTORY — DX: Unspecified hearing loss, unspecified ear: H91.90

## 2013-11-01 LAB — BASIC METABOLIC PANEL
BUN: 21 mg/dL (ref 6–23)
CHLORIDE: 102 meq/L (ref 96–112)
CO2: 23 mEq/L (ref 19–32)
Calcium: 10.2 mg/dL (ref 8.4–10.5)
Creatinine, Ser: 1.23 mg/dL — ABNORMAL HIGH (ref 0.50–1.10)
GFR calc Af Amer: 47 mL/min — ABNORMAL LOW (ref >90)
GFR, EST NON AFRICAN AMERICAN: 41 mL/min — AB (ref >90)
Glucose, Bld: 109 mg/dL — ABNORMAL HIGH (ref 70–99)
Potassium: 4.4 mEq/L (ref 3.7–5.3)
Sodium: 138 mEq/L (ref 137–147)

## 2013-11-01 LAB — CBC
HEMATOCRIT: 37.1 % (ref 36.0–46.0)
Hemoglobin: 12.1 g/dL (ref 12.0–15.0)
MCH: 29.4 pg (ref 26.0–34.0)
MCHC: 32.6 g/dL (ref 30.0–36.0)
MCV: 90 fL (ref 78.0–100.0)
Platelets: 221 10*3/uL (ref 150–400)
RBC: 4.12 MIL/uL (ref 3.87–5.11)
RDW: 13.3 % (ref 11.5–15.5)
WBC: 9.1 10*3/uL (ref 4.0–10.5)

## 2013-11-01 LAB — SURGICAL PCR SCREEN
MRSA, PCR: NEGATIVE
Staphylococcus aureus: NEGATIVE

## 2013-11-01 NOTE — Progress Notes (Signed)
PCP is Dr. Tarri Fullerichard Escajeda at [facility] in [place] # [phone] and Cardiologist is Dr. Rollene RotundaJames Hochrein; encounters [facility]. Patient denied having any acute cardiac or pulmonary issues. Husband at chairside during PAT visit.

## 2013-11-01 NOTE — Pre-Procedure Instructions (Signed)
[name]  11/01/2013   Your procedure is scheduled on:  Friday Nov 04, 2013 at 9:55 AM.  [facility] at 07:50 AM.  Call this number if you have problems the morning of surgery: [phone]   Remember:   Do not eat food or drink liquids after midnight.   Take these medicines the morning of surgery with A SIP OF WATER: Omeprazole (Prilosec), Isosorbide (Imdur)  Stop taking aspirin, over the counter vitamins/herbal medications, NSAIDS as of today   Do not wear jewelry, make-up or nail polish.  Do not wear lotions, powders, or perfumes.   Do not shave 48 hours prior to surgery.   Do not bring valuables to the hospital.  [facility] is not responsible for any belongings or valuables.               Contacts, dentures or bridgework may not be worn into surgery.  Leave suitcase in the car. After surgery it may be brought to your room.  For patients admitted to the hospital, discharge time is determined by your  treatment team.   Shower using CHG soap the night before and the morning of your surgery               Patients discharged the day of surgery will not be allowed to drive home.  Please read over the following fact sheets that you were given: Pain Booklet, Coughing and Deep Breathing, MRSA Information and Surgical Site Infection Prevention

## 2013-11-02 NOTE — Progress Notes (Signed)
Anesthesia Chart Review:  Patient is a 80 year old female posted for left C4-5 foraminotomy on 11/04/13 by Dr. Jordan LikesPool.  History includes AS/CAD s/p CABG/AVR - LIMA-LAD, SVG-RCA, SVG-OM1-OM2 and Mitroflow pericardial AV prosthesis  07/2107 (has recurrent moderate AS by 12/2012 echo), dyslipidemia, HTN, blind in right eye, glaucoma, left BBB, CKD, GERD, hard of hearing, arthritis, non-obstructive carotid disease. PCP is Dr. Tarri Fullerichard Escajeda at [facility].  Cardiologist is Dr. Antoine PocheHochrein who cleared patient with acceptable risk for this procedure.  EKG on 10/25/13 showed NSR, LAD, left BBB.  Echo on 12/07/12 showed: Normal LV function, EF 55-60%, mild LVH; prosthetic aortic valve with elevated mean gradient (higher than expected for tissue valve) suggesting moderate AS; suggest TEE to better assess if clinically indicated; mild AR; mild MR; PA systolic pressure mildly to moderated increased; PA peak pressure 44mm Hg. Ascending aorta mild to moderately dilated; consider CTA or MRA to further assess.  Cardiac cath on 01/12/13 showed: Coronary dominance: Right  Left mainstem: luminal irregularities.  Left anterior descending (LAD): The LAD fills via the LIMA. There is no high grade distal disease. It is a somewhat small vessel. The diagonal backfills via this graft and has no high grade disease. Left circumflex (LCx): AV groove high grade proximal stenosis. Occluded between OM1 and OM2. These vessels fill the the SVG. Both of the OMs are moderate sized and free of high grade disease. Right coronary artery (RCA): Not selectively engaged. However, it was seen to be occluded proximally after injection of the SVG to the RCA. The PDA and PLs are free of high grade disease but they are somewhat small vessels. Grafts: SVG to RCA: Widely patent.  SVG sequential to OM1 and OM2: Widely patent  LIMA to LAD: Patent. Left ventriculography: Left ventricular was not injected secondary to renal insufficiency. Final Conclusions: Severe  native vessel disease. Patent grafts. Moderate aortic valve stenosis. HTN and mildly elevated PCWP and PA pressures  Recommendations: Continue medical management.   Last stress test was in 2012.  Carotid duplex on 11/26/12 ([facility]) showed < 40% bilateral ICA stenosis.  Preoperative CXR and labs noted.   She has been cleared by her cardiologist. If no acute changes then I anticipate that she can proceed as planned.  Angela Ochsllison Madison Direnzo, PA-C [facility]/Anesthesiology Phone [phone] 11/02/2013 1:26 PM

## 2013-11-03 NOTE — Progress Notes (Signed)
Pt called and informed of time change.  Informed pt to be here at 0530 in the morning. Teach back complete. Pt voices understanding.

## 2013-11-04 ENCOUNTER — Encounter (HOSPITAL_COMMUNITY): Payer: Self-pay | Admitting: Anesthesiology

## 2013-11-04 ENCOUNTER — Observation Stay (HOSPITAL_COMMUNITY)
Admission: RE | Admit: 2013-11-04 | Discharge: 2013-11-04 | Disposition: A | Discharge status: Home / Self Care | Payer: Medicare HMO | Source: Ambulatory Visit | Attending: Neurosurgery | Admitting: Neurosurgery

## 2013-11-04 ENCOUNTER — Encounter (HOSPITAL_COMMUNITY)
Admission: RE | Disposition: A | Discharge status: Home / Self Care | Payer: Self-pay | Source: Ambulatory Visit | Attending: Neurosurgery

## 2013-11-04 ENCOUNTER — Inpatient Hospital Stay (HOSPITAL_COMMUNITY): Payer: Medicare HMO | Admitting: Anesthesiology

## 2013-11-04 ENCOUNTER — Inpatient Hospital Stay (HOSPITAL_COMMUNITY): Payer: Medicare HMO

## 2013-11-04 ENCOUNTER — Encounter (HOSPITAL_COMMUNITY): Payer: Medicare HMO | Admitting: Vascular Surgery

## 2013-11-04 DIAGNOSIS — Z472 Encounter for removal of internal fixation device: Secondary | ICD-10-CM | POA: Insufficient documentation

## 2013-11-04 DIAGNOSIS — Z7982 Long term (current) use of aspirin: Secondary | ICD-10-CM | POA: Insufficient documentation

## 2013-11-04 DIAGNOSIS — Z01818 Encounter for other preprocedural examination: Secondary | ICD-10-CM | POA: Insufficient documentation

## 2013-11-04 DIAGNOSIS — N189 Chronic kidney disease, unspecified: Secondary | ICD-10-CM | POA: Insufficient documentation

## 2013-11-04 DIAGNOSIS — M5412 Radiculopathy, cervical region: Secondary | ICD-10-CM

## 2013-11-04 DIAGNOSIS — Z951 Presence of aortocoronary bypass graft: Secondary | ICD-10-CM | POA: Insufficient documentation

## 2013-11-04 DIAGNOSIS — Z01812 Encounter for preprocedural laboratory examination: Secondary | ICD-10-CM | POA: Insufficient documentation

## 2013-11-04 DIAGNOSIS — H409 Unspecified glaucoma: Secondary | ICD-10-CM | POA: Insufficient documentation

## 2013-11-04 DIAGNOSIS — K219 Gastro-esophageal reflux disease without esophagitis: Secondary | ICD-10-CM | POA: Insufficient documentation

## 2013-11-04 DIAGNOSIS — I447 Left bundle-branch block, unspecified: Secondary | ICD-10-CM | POA: Insufficient documentation

## 2013-11-04 DIAGNOSIS — Z01811 Encounter for preprocedural respiratory examination: Secondary | ICD-10-CM | POA: Insufficient documentation

## 2013-11-04 DIAGNOSIS — H5440 Blindness, one eye, unspecified eye: Secondary | ICD-10-CM | POA: Insufficient documentation

## 2013-11-04 DIAGNOSIS — M4692 Unspecified inflammatory spondylopathy, cervical region: Secondary | ICD-10-CM | POA: Diagnosis present

## 2013-11-04 DIAGNOSIS — E785 Hyperlipidemia, unspecified: Secondary | ICD-10-CM | POA: Insufficient documentation

## 2013-11-04 DIAGNOSIS — I2510 Atherosclerotic heart disease of native coronary artery without angina pectoris: Secondary | ICD-10-CM | POA: Insufficient documentation

## 2013-11-04 DIAGNOSIS — Z952 Presence of prosthetic heart valve: Secondary | ICD-10-CM | POA: Insufficient documentation

## 2013-11-04 DIAGNOSIS — M129 Arthropathy, unspecified: Secondary | ICD-10-CM | POA: Insufficient documentation

## 2013-11-04 DIAGNOSIS — M47812 Spondylosis without myelopathy or radiculopathy, cervical region: Principal | ICD-10-CM | POA: Diagnosis present

## 2013-11-04 DIAGNOSIS — I6529 Occlusion and stenosis of unspecified carotid artery: Secondary | ICD-10-CM | POA: Insufficient documentation

## 2013-11-04 DIAGNOSIS — R011 Cardiac murmur, unspecified: Secondary | ICD-10-CM | POA: Insufficient documentation

## 2013-11-04 DIAGNOSIS — I129 Hypertensive chronic kidney disease with stage 1 through stage 4 chronic kidney disease, or unspecified chronic kidney disease: Secondary | ICD-10-CM | POA: Insufficient documentation

## 2013-11-04 DIAGNOSIS — H9190 Unspecified hearing loss, unspecified ear: Secondary | ICD-10-CM | POA: Insufficient documentation

## 2013-11-04 DIAGNOSIS — I739 Peripheral vascular disease, unspecified: Secondary | ICD-10-CM | POA: Insufficient documentation

## 2013-11-04 DIAGNOSIS — Z981 Arthrodesis status: Secondary | ICD-10-CM | POA: Insufficient documentation

## 2013-11-04 HISTORY — PX: POSTERIOR CERVICAL LAMINECTOMY: SHX2248

## 2013-11-04 SURGERY — POSTERIOR CERVICAL LAMINECTOMY
Anesthesia: General | Laterality: Left

## 2013-11-04 MED ORDER — FENTANYL CITRATE 0.05 MG/ML IJ SOLN
INTRAMUSCULAR | Status: AC
  Filled 2013-11-04: qty 5

## 2013-11-04 MED ORDER — VITAMIN D (ERGOCALCIFEROL) 1.25 MG (50000 UNIT) PO CAPS: 50000.0000 [IU] | ORAL_CAPSULE | ORAL | Status: DC

## 2013-11-04 MED ORDER — METOCLOPRAMIDE HCL 5 MG/ML IJ SOLN: 10.0000 mg | Freq: Once | INTRAMUSCULAR | Status: DC | PRN

## 2013-11-04 MED ORDER — VECURONIUM BROMIDE 10 MG IV SOLR
INTRAVENOUS | Status: AC
  Filled 2013-11-04: qty 10

## 2013-11-04 MED ORDER — ISOSORBIDE MONONITRATE ER 30 MG PO TB24
30.0000 mg | ORAL_TABLET | Freq: Every day | ORAL | Status: DC
  Filled 2013-11-04: qty 1

## 2013-11-04 MED ORDER — HEMOSTATIC AGENTS (NO CHARGE) OPTIME
TOPICAL | Status: DC | PRN
  Administered 2013-11-04: 1 via TOPICAL

## 2013-11-04 MED ORDER — ONDANSETRON HCL 4 MG/2ML IJ SOLN
INTRAMUSCULAR | Status: DC | PRN
  Administered 2013-11-04: 4 mg via INTRAVENOUS

## 2013-11-04 MED ORDER — NITROGLYCERIN 0.3 MG SL SUBL
0.3000 mg | SUBLINGUAL_TABLET | SUBLINGUAL | Status: DC | PRN
  Filled 2013-11-04: qty 100

## 2013-11-04 MED ORDER — ASPIRIN EC 81 MG PO TBEC
81.0000 mg | DELAYED_RELEASE_TABLET | Freq: Every day | ORAL | Status: DC
  Filled 2013-11-04: qty 1

## 2013-11-04 MED ORDER — FENTANYL CITRATE 0.05 MG/ML IJ SOLN
INTRAMUSCULAR | Status: DC | PRN
  Administered 2013-11-04 (×2): 100 ug via INTRAVENOUS

## 2013-11-04 MED ORDER — ZOLPIDEM TARTRATE 5 MG PO TABS: 5.0000 mg | ORAL_TABLET | Freq: Every evening | ORAL | Status: DC | PRN

## 2013-11-04 MED ORDER — HYDROMORPHONE HCL PF 1 MG/ML IJ SOLN
0.2500 mg | INTRAMUSCULAR | Status: DC | PRN
  Administered 2013-11-04: 0.25 mg via INTRAVENOUS
  Administered 2013-11-04: 0.5 mg via INTRAVENOUS

## 2013-11-04 MED ORDER — HYDROCODONE-ACETAMINOPHEN 5-325 MG PO TABS: 1.0000 | ORAL_TABLET | ORAL | Status: DC | PRN

## 2013-11-04 MED ORDER — HYDROCHLOROTHIAZIDE 25 MG PO TABS
25.0000 mg | ORAL_TABLET | Freq: Every day | ORAL | Status: DC
  Filled 2013-11-04: qty 1

## 2013-11-04 MED ORDER — DEXAMETHASONE SODIUM PHOSPHATE 10 MG/ML IJ SOLN
INTRAMUSCULAR | Status: AC
  Filled 2013-11-04: qty 1

## 2013-11-04 MED ORDER — SODIUM CHLORIDE 0.9 % IR SOLN
Status: DC | PRN
  Administered 2013-11-04: 09:00:00

## 2013-11-04 MED ORDER — NEOSTIGMINE METHYLSULFATE 10 MG/10ML IV SOLN
INTRAVENOUS | Status: DC | PRN
  Administered 2013-11-04: 5 mg via INTRAVENOUS

## 2013-11-04 MED ORDER — LIDOCAINE HCL (CARDIAC) 20 MG/ML IV SOLN
INTRAVENOUS | Status: DC | PRN
  Administered 2013-11-04: 40 mg via INTRAVENOUS

## 2013-11-04 MED ORDER — PHENOL 1.4 % MT LIQD: 1.0000 | OROMUCOSAL | Status: DC | PRN

## 2013-11-04 MED ORDER — NEOSTIGMINE METHYLSULFATE 10 MG/10ML IV SOLN
INTRAVENOUS | Status: AC
  Filled 2013-11-04: qty 1

## 2013-11-04 MED ORDER — EPHEDRINE SULFATE 50 MG/ML IJ SOLN
INTRAMUSCULAR | Status: DC | PRN
  Administered 2013-11-04: 10 mg via INTRAVENOUS

## 2013-11-04 MED ORDER — PANTOPRAZOLE SODIUM 40 MG PO TBEC: 40.0000 mg | DELAYED_RELEASE_TABLET | Freq: Every day | ORAL | Status: DC

## 2013-11-04 MED ORDER — ARTIFICIAL TEARS OP OINT
TOPICAL_OINTMENT | OPHTHALMIC | Status: DC | PRN
  Administered 2013-11-04: 1 via OPHTHALMIC

## 2013-11-04 MED ORDER — ONDANSETRON HCL 4 MG/2ML IJ SOLN
INTRAMUSCULAR | Status: AC
  Filled 2013-11-04: qty 2

## 2013-11-04 MED ORDER — KETOROLAC TROMETHAMINE 15 MG/ML IJ SOLN
INTRAMUSCULAR | Status: DC | PRN
  Administered 2013-11-04: 15 mg via INTRAVENOUS

## 2013-11-04 MED ORDER — MIDAZOLAM HCL 5 MG/5ML IJ SOLN
INTRAMUSCULAR | Status: DC | PRN
  Administered 2013-11-04 (×2): 1 mg via INTRAVENOUS

## 2013-11-04 MED ORDER — CEFAZOLIN SODIUM 1-5 GM-% IV SOLN
1.0000 g | Freq: Three times a day (TID) | INTRAVENOUS | Status: DC
  Administered 2013-11-04: 1 g via INTRAVENOUS
  Filled 2013-11-04 (×2): qty 50

## 2013-11-04 MED ORDER — BUPIVACAINE HCL (PF) 0.25 % IJ SOLN
INTRAMUSCULAR | Status: DC | PRN
  Administered 2013-11-04: 20 mL

## 2013-11-04 MED ORDER — CEFAZOLIN SODIUM 1-5 GM-% IV SOLN
INTRAVENOUS | Status: AC
  Administered 2013-11-04: 1 g via INTRAVENOUS
  Filled 2013-11-04: qty 50

## 2013-11-04 MED ORDER — OXYCODONE-ACETAMINOPHEN 5-325 MG PO TABS
ORAL_TABLET | ORAL | Status: AC
  Filled 2013-11-04: qty 2

## 2013-11-04 MED ORDER — ACETAMINOPHEN 325 MG PO TABS
650.0000 mg | ORAL_TABLET | ORAL | Status: DC | PRN
Start: 2013-11-04 — End: 2013-11-04

## 2013-11-04 MED ORDER — THROMBIN 5000 UNITS EX SOLR
CUTANEOUS | Status: DC | PRN
Start: 2013-11-04 — End: 2013-11-04
  Administered 2013-11-04 (×2): 5000 [IU] via TOPICAL

## 2013-11-04 MED ORDER — NITROGLYCERIN 0.4 MG SL SUBL: 0.4000 mg | SUBLINGUAL_TABLET | SUBLINGUAL | Status: DC | PRN

## 2013-11-04 MED ORDER — TIMOLOL MALEATE 0.5 % OP SOLN
1.0000 [drp] | Freq: Two times a day (BID) | OPHTHALMIC | Status: DC
  Filled 2013-11-04: qty 5

## 2013-11-04 MED ORDER — KETOROLAC TROMETHAMINE 30 MG/ML IJ SOLN
15.0000 mg | Freq: Four times a day (QID) | INTRAMUSCULAR | Status: DC
  Administered 2013-11-04: 15 mg via INTRAVENOUS
  Filled 2013-11-04: qty 1

## 2013-11-04 MED ORDER — ROCURONIUM BROMIDE 100 MG/10ML IV SOLN
INTRAVENOUS | Status: DC | PRN
  Administered 2013-11-04: 35 mg via INTRAVENOUS

## 2013-11-04 MED ORDER — GABAPENTIN 300 MG PO CAPS
300.0000 mg | ORAL_CAPSULE | Freq: Every day | ORAL | Status: DC
  Filled 2013-11-04: qty 1

## 2013-11-04 MED ORDER — OXYCODONE HCL 5 MG PO TABS: 5.0000 mg | ORAL_TABLET | Freq: Once | ORAL | Status: DC | PRN

## 2013-11-04 MED ORDER — ONDANSETRON HCL 4 MG/2ML IJ SOLN: 4.0000 mg | INTRAMUSCULAR | Status: DC | PRN

## 2013-11-04 MED ORDER — OXYCODONE HCL 5 MG/5ML PO SOLN: 5.0000 mg | Freq: Once | ORAL | Status: DC | PRN

## 2013-11-04 MED ORDER — SODIUM CHLORIDE 0.9 % IV SOLN
INTRAVENOUS | Status: DC | PRN
  Administered 2013-11-04: 07:00:00 via INTRAVENOUS

## 2013-11-04 MED ORDER — PROPOFOL 10 MG/ML IV BOLUS
INTRAVENOUS | Status: DC | PRN
  Administered 2013-11-04: 120 mg via INTRAVENOUS

## 2013-11-04 MED ORDER — STERILE WATER FOR INJECTION IJ SOLN
INTRAMUSCULAR | Status: AC
  Filled 2013-11-04: qty 10

## 2013-11-04 MED ORDER — ARTIFICIAL TEARS OP OINT
TOPICAL_OINTMENT | OPHTHALMIC | Status: AC
  Filled 2013-11-04: qty 3.5

## 2013-11-04 MED ORDER — BENAZEPRIL HCL 20 MG PO TABS
20.0000 mg | ORAL_TABLET | Freq: Every day | ORAL | Status: DC
  Filled 2013-11-04: qty 1

## 2013-11-04 MED ORDER — HYDROMORPHONE HCL PF 1 MG/ML IJ SOLN
INTRAMUSCULAR | Status: AC
  Administered 2013-11-04: 0.5 mg via INTRAVENOUS
  Filled 2013-11-04: qty 1

## 2013-11-04 MED ORDER — 0.9 % SODIUM CHLORIDE (POUR BTL) OPTIME
TOPICAL | Status: DC | PRN
  Administered 2013-11-04: 1000 mL

## 2013-11-04 MED ORDER — PROPOFOL 10 MG/ML IV BOLUS
INTRAVENOUS | Status: AC
  Filled 2013-11-04: qty 20

## 2013-11-04 MED ORDER — BACITRACIN ZINC 500 UNIT/GM EX OINT
TOPICAL_OINTMENT | CUTANEOUS | Status: DC | PRN
Start: 2013-11-04 — End: 2013-11-04
  Administered 2013-11-04: 1 via TOPICAL

## 2013-11-04 MED ORDER — SODIUM CHLORIDE 0.9 % IJ SOLN: 3.0000 mL | INTRAMUSCULAR | Status: DC | PRN

## 2013-11-04 MED ORDER — OXYCODONE-ACETAMINOPHEN 5-325 MG PO TABS
1.0000 | ORAL_TABLET | ORAL | Status: DC | PRN
  Administered 2013-11-04: 2 via ORAL

## 2013-11-04 MED ORDER — DEXTROSE 5 % IV SOLN
INTRAVENOUS | Status: DC | PRN
  Administered 2013-11-04: 08:00:00 via INTRAVENOUS

## 2013-11-04 MED ORDER — SODIUM CHLORIDE 0.9 % IJ SOLN: 3.0000 mL | Freq: Two times a day (BID) | INTRAMUSCULAR | Status: DC

## 2013-11-04 MED ORDER — HYDROMORPHONE HCL PF 1 MG/ML IJ SOLN: 0.5000 mg | INTRAMUSCULAR | Status: DC | PRN

## 2013-11-04 MED ORDER — GLYCOPYRROLATE 0.2 MG/ML IJ SOLN
INTRAMUSCULAR | Status: DC | PRN
  Administered 2013-11-04: 0.6 mg via INTRAVENOUS

## 2013-11-04 MED ORDER — LIDOCAINE HCL (CARDIAC) 20 MG/ML IV SOLN
INTRAVENOUS | Status: AC
  Filled 2013-11-04: qty 10

## 2013-11-04 MED ORDER — MENTHOL 3 MG MT LOZG: 1.0000 | LOZENGE | OROMUCOSAL | Status: DC | PRN

## 2013-11-04 MED ORDER — ALUM & MAG HYDROXIDE-SIMETH 200-200-20 MG/5ML PO SUSP: 30.0000 mL | Freq: Four times a day (QID) | ORAL | Status: DC | PRN

## 2013-11-04 MED ORDER — LACTATED RINGERS IV SOLN
INTRAVENOUS | Status: DC | PRN
  Administered 2013-11-04: 07:00:00 via INTRAVENOUS

## 2013-11-04 MED ORDER — ASPIRIN 81 MG PO TABS: 81.0000 mg | ORAL_TABLET | Freq: Every day | ORAL | Status: DC

## 2013-11-04 MED ORDER — MIDAZOLAM HCL 2 MG/2ML IJ SOLN
INTRAMUSCULAR | Status: AC
  Filled 2013-11-04: qty 2

## 2013-11-04 MED ORDER — CYCLOBENZAPRINE HCL 10 MG PO TABS
ORAL_TABLET | ORAL | Status: AC
  Filled 2013-11-04: qty 1

## 2013-11-04 MED ORDER — CYCLOBENZAPRINE HCL 10 MG PO TABS
10.0000 mg | ORAL_TABLET | Freq: Three times a day (TID) | ORAL | Status: DC | PRN
  Administered 2013-11-04: 10 mg via ORAL

## 2013-11-04 MED ORDER — SENNA 8.6 MG PO TABS: 1.0000 | ORAL_TABLET | Freq: Two times a day (BID) | ORAL | Status: DC

## 2013-11-04 MED ORDER — SODIUM CHLORIDE 0.9 % IV SOLN
10.0000 mg | INTRAVENOUS | Status: DC | PRN
  Administered 2013-11-04: 10 ug/min via INTRAVENOUS

## 2013-11-04 MED ORDER — ACETAMINOPHEN 650 MG RE SUPP
650.0000 mg | RECTAL | Status: DC | PRN
Start: 2013-11-04 — End: 2013-11-04

## 2013-11-04 MED ORDER — GLYCOPYRROLATE 0.2 MG/ML IJ SOLN
INTRAMUSCULAR | Status: AC
  Filled 2013-11-04: qty 3

## 2013-11-04 MED ORDER — DEXAMETHASONE SODIUM PHOSPHATE 10 MG/ML IJ SOLN
INTRAMUSCULAR | Status: DC | PRN
  Administered 2013-11-04: 10 mg via INTRAVENOUS

## 2013-11-04 MED ORDER — ATORVASTATIN CALCIUM 80 MG PO TABS
80.0000 mg | ORAL_TABLET | Freq: Every day | ORAL | Status: DC
  Filled 2013-11-04: qty 1

## 2013-11-04 SURGICAL SUPPLY — 61 items
ADH SKN CLS APL DERMABOND .7 (GAUZE/BANDAGES/DRESSINGS) ×1
APL SKNCLS STERI-STRIP NONHPOA (GAUZE/BANDAGES/DRESSINGS) ×1
BAG DECANTER FOR FLEXI CONT (MISCELLANEOUS) ×2 IMPLANT
BENZOIN TINCTURE PRP APPL 2/3 (GAUZE/BANDAGES/DRESSINGS) ×2 IMPLANT
BLADE 10 SAFETY STRL DISP (BLADE) ×1 IMPLANT
BLADE SURG ROTATE 9660 (MISCELLANEOUS) IMPLANT
BRUSH SCRUB EZ PLAIN DRY (MISCELLANEOUS) ×2 IMPLANT
BUR MATCHSTICK NEURO 3.0 LAGG (BURR) ×2 IMPLANT
CANISTER SUCT 3000ML (MISCELLANEOUS) ×2 IMPLANT
CONT SPEC 4OZ CLIKSEAL STRL BL (MISCELLANEOUS) ×2 IMPLANT
DERMABOND ADVANCED (GAUZE/BANDAGES/DRESSINGS) ×1
DERMABOND ADVANCED .7 DNX12 (GAUZE/BANDAGES/DRESSINGS) ×1 IMPLANT
DRAPE LAPAROTOMY 100X72 PEDS (DRAPES) ×2 IMPLANT
DRAPE MICROSCOPE ZEISS OPMI (DRAPES) ×2 IMPLANT
DRAPE POUCH INSTRU U-SHP 10X18 (DRAPES) ×2 IMPLANT
DRSG OPSITE POSTOP 3X4 (GAUZE/BANDAGES/DRESSINGS) ×2 IMPLANT
DURAPREP 26ML APPLICATOR (WOUND CARE) ×2 IMPLANT
ELECT REM PT RETURN 9FT ADLT (ELECTROSURGICAL) ×2
ELECTRODE REM PT RTRN 9FT ADLT (ELECTROSURGICAL) ×1 IMPLANT
GAUZE SPONGE 4X4 16PLY XRAY LF (GAUZE/BANDAGES/DRESSINGS) IMPLANT
GLOVE BIOGEL PI IND STRL 8 (GLOVE) IMPLANT
GLOVE BIOGEL PI INDICATOR 8 (GLOVE) ×1
GLOVE ECLIPSE 7.5 STRL STRAW (GLOVE) ×6 IMPLANT
GLOVE ECLIPSE 8.0 STRL XLNG CF (GLOVE) ×2 IMPLANT
GLOVE ECLIPSE 8.5 STRL (GLOVE) ×1 IMPLANT
GLOVE ECLIPSE 9.0 STRL (GLOVE) ×1 IMPLANT
GLOVE EXAM NITRILE LRG STRL (GLOVE) IMPLANT
GLOVE EXAM NITRILE MD LF STRL (GLOVE) IMPLANT
GLOVE EXAM NITRILE XL STR (GLOVE) IMPLANT
GLOVE EXAM NITRILE XS STR PU (GLOVE) IMPLANT
GOWN BRE IMP SLV AUR LG STRL (GOWN DISPOSABLE) ×2 IMPLANT
GOWN BRE IMP SLV AUR XL STRL (GOWN DISPOSABLE) ×1 IMPLANT
GOWN STRL REIN 2XL LVL4 (GOWN DISPOSABLE) IMPLANT
GOWN STRL REUS W/ TWL LRG LVL3 (GOWN DISPOSABLE) IMPLANT
GOWN STRL REUS W/ TWL XL LVL3 (GOWN DISPOSABLE) IMPLANT
GOWN STRL REUS W/TWL 2XL LVL3 (GOWN DISPOSABLE) ×1 IMPLANT
GOWN STRL REUS W/TWL LRG LVL3 (GOWN DISPOSABLE) ×2
GOWN STRL REUS W/TWL XL LVL3 (GOWN DISPOSABLE) ×4
KIT BASIN OR (CUSTOM PROCEDURE TRAY) ×2 IMPLANT
KIT ROOM TURNOVER OR (KITS) ×2 IMPLANT
NDL SPNL 22GX3.5 QUINCKE BK (NEEDLE) ×1 IMPLANT
NEEDLE HYPO 22GX1.5 SAFETY (NEEDLE) ×2 IMPLANT
NEEDLE SPNL 22GX3.5 QUINCKE BK (NEEDLE) ×2 IMPLANT
NS IRRIG 1000ML POUR BTL (IV SOLUTION) ×2 IMPLANT
PACK LAMINECTOMY NEURO (CUSTOM PROCEDURE TRAY) ×2 IMPLANT
PAD ARMBOARD 7.5X6 YLW CONV (MISCELLANEOUS) ×6 IMPLANT
PATTIES SURGICAL 1X1 (DISPOSABLE) ×1 IMPLANT
PIN MAYFIELD SKULL DISP (PIN) ×3 IMPLANT
RUBBERBAND STERILE (MISCELLANEOUS) ×2 IMPLANT
SPONGE GAUZE 4X4 12PLY (GAUZE/BANDAGES/DRESSINGS) ×2 IMPLANT
SPONGE LAP 4X18 X RAY DECT (DISPOSABLE) IMPLANT
SPONGE SURGIFOAM ABS GEL SZ50 (HEMOSTASIS) ×2 IMPLANT
STRIP CLOSURE SKIN 1/2X4 (GAUZE/BANDAGES/DRESSINGS) ×2 IMPLANT
SUT VIC AB 0 CT1 18XCR BRD8 (SUTURE) ×1 IMPLANT
SUT VIC AB 0 CT1 8-18 (SUTURE) ×2
SUT VIC AB 2-0 CT2 18 VCP726D (SUTURE) ×2 IMPLANT
SUT VIC AB 3-0 SH 8-18 (SUTURE) ×2 IMPLANT
SYR 20ML ECCENTRIC (SYRINGE) ×2 IMPLANT
TOWEL OR 17X24 6PK STRL BLUE (TOWEL DISPOSABLE) ×2 IMPLANT
TOWEL OR 17X26 10 PK STRL BLUE (TOWEL DISPOSABLE) ×2 IMPLANT
WATER STERILE IRR 1000ML POUR (IV SOLUTION) ×2 IMPLANT

## 2013-11-04 NOTE — Discharge Instructions (Signed)

## 2013-11-04 NOTE — Discharge Summary (Signed)
Physician Discharge Summary  Patient ID: [name] MRN: [id number] DOB/AGE: 78/11/1932 78 y.o.  Admit date: 11/04/2013 Discharge date: 11/04/2013  Admission Diagnoses:  Discharge Diagnoses:  Principal Problem:   Cervical spondylosis without myelopathy Active Problems:   Cervical spondylitis with radiculitis   Discharged Condition: good  Hospital Course: Patient admitted the hospital where she underwent uncomplicated left-sided C4-5 laminotomy and foraminotomies or postoperative she is doing quite well. Neck and upper journey pain resolved. Strength sensation intact. Wound clean and dry. Ready for discharge home.  Consults:   Significant Diagnostic Studies:   Treatments:   Discharge Exam: Blood pressure 134/69, pulse 75, temperature 98 F (36.7 C), resp. rate 18, SpO2 95.00%.  awake and alert. Oriented and appropriate. Motor and sensory function intact. Wound clean dry. Chest and abdomen benign.  Disposition: 01-Home or Self Care   Future Appointments Provider Department Dept Phone   12/06/2013 10:00 AM [facility] [address] [place] [facility] [address] [phone]   12/06/2013 12:00 PM [facility] [place] [facility] [address] [phone]   12/06/2013 1:00 PM Pryor OchoaJames D Lawson, MD [facility] [phone]       Medication List         ALKA-SELTZER PLS NIGHT CLD/FLU PO  Take 1 capsule by mouth daily as needed (for allergies).     aspirin 81 MG tablet  Take 81 mg by mouth daily.     atorvastatin 80 MG tablet  Commonly known as:  LIPITOR  Take 80 mg by mouth daily.     benazepril 20 MG tablet  Commonly known as:  LOTENSIN  Take 20 mg by mouth daily.     CALTRATE 600 PO  Take 1 tablet by mouth daily.     gabapentin 300 MG capsule  Commonly known as:  NEURONTIN  Take 300 mg by mouth at bedtime.     hydrochlorothiazide 25 MG tablet  Commonly known as:  HYDRODIURIL  Take 1 tablet (25 mg total) by mouth  daily.     HYDROcodone-acetaminophen 5-325 MG per tablet  Commonly known as:  NORCO/VICODIN  Take 1-2 tablets by mouth every 4 (four) hours as needed for moderate pain.     ibuprofen 200 MG tablet  Commonly known as:  ADVIL,MOTRIN  Take 200 mg by mouth every 6 (six) hours as needed for pain or headache (headache).     isosorbide mononitrate 30 MG 24 hr tablet  Commonly known as:  IMDUR  Take 1 tablet (30 mg total) by mouth daily.     NITROSTAT 0.3 MG SL tablet  Generic drug:  nitroGLYCERIN  Place 0.3 mg under the tongue every 5 (five) minutes as needed for chest pain.     omeprazole 20 MG capsule  Commonly known as:  PRILOSEC  Take 1 capsule (20 mg total) by mouth daily.     timolol 0.5 % ophthalmic solution  Commonly known as:  TIMOPTIC  Place 1 drop into both eyes 2 (two) times daily.     Vitamin D (Ergocalciferol) 50000 UNITS Caps capsule  Commonly known as:  DRISDOL  Take 50,000 Units by mouth every 7 (seven) days. On Tuesday     zolpidem 5 MG tablet  Commonly known as:  AMBIEN  Take 5 mg by mouth at bedtime as needed for sleep (sleep).           Follow-up Information   Follow up with Temple PaciniPOOL,Marni Franzoni A, MD.   Specialty:  Neurosurgery   Contact information:  [address] [place] [address] [phone]       Signed: Kathaleen MaserHenry A Saleemah Mollenhauer 11/04/2013, 5:57 PM

## 2013-11-04 NOTE — Anesthesia Procedure Notes (Signed)
Procedure Name: Intubation Date/Time: 11/04/2013 8:12 AM Performed by: Jacquiline Doe A Pre-anesthesia Checklist: Patient identified, Timeout performed, Emergency Drugs available, Suction available and Patient being monitored Patient Re-evaluated:Patient Re-evaluated prior to inductionOxygen Delivery Method: Circle system utilized Preoxygenation: Pre-oxygenation with 100% oxygen Intubation Type: IV induction and Cricoid Pressure applied Ventilation: Mask ventilation without difficulty and Oral airway inserted - appropriate to patient size Laryngoscope Size: Mac and 3 Grade View: Grade I Tube type: Oral Tube size: 7.5 mm Number of attempts: 1 Airway Equipment and Method: Stylet and LTA kit utilized Placement Confirmation: ETT inserted through vocal cords under direct vision,  breath sounds checked- equal and bilateral and positive ETCO2 Secured at: 21 cm Tube secured with: Tape Comments: Pt edentulous

## 2013-11-04 NOTE — Op Note (Signed)
Date of procedure: 11/04/2013  Date of dictation: Same  Service: Neurosurgery  Preoperative diagnosis: Left C4-5 spondylosis with radiculopathy.  Postoperative diagnosis: Same  Procedure Name: Left C4-5 laminotomy and foraminotomies. Removal of left C5-6 lateral mass instrumentation  Surgeon:Aydien Majette A.Rakia Frayne, M.D.  Asst. Surgeon: Gerlene FeeKritzer  Anesthesia: General  Indication: 80 year old female status post previous C5-6 anterior and posterior cervical fusion presents with persistent left shoulder pain failing conservative management. Workup demonstrates evidence of spondylitic compression dorsally at C4-5. Patient's failed all of his conservative management. She is not wishing to undergo fusion at C4-5. Plan is for left-sided C4-5 laminotomy and foraminotomies for hopeful improvement in her symptoms.  Operative note: After induction anesthesia, patient positioned prone onto bolsters with her head fixed in a [place] pin headrest. Patient's posterior cervical region prepped and draped sterilely. Incision made overlying C4-5. This carried down sharply in the midline. Supper off Sexton performed on the left side exposing lamina and facet joints of C4 and C5 as well as the previously placed left-sided C5-6 lateral mass instrumentation and interspinous cable. Interspinous cable was cut and removed. Fusion at C5-6 explored and found to be solid. Instrumentation was removed on the left side at C5-6. Laminotomy and foraminotomies are performed using high-speed drill Kerrison rongeurs to remove the inferior aspect of lamina of C4, superior aspect of lamina of C5, and the medial aspect of the C45 facet joint. Ligament flavum was elevated and resected piecemeal fashion as was some degree of facet joint pannus. C5 nerve root identified. Foraminotomy was performed of course exiting C5 nerve root. During the process of the foraminotomy the superior articulating facet of C5 fracture. This was removed. A complete  foraminotomies C5 was performed. There is no evidence of injury to the nerve root itself. Wound is then irrigated out solution. There is no evidence of gross instability. Gelfoam was placed topically for hemostasis. Wounds and close in layers with Vicryl sutures. Steri-Strips and sterile dressing were applied. No apparent complications. Patient tolerated suture well and she returns they're covering postop.

## 2013-11-04 NOTE — Progress Notes (Signed)
Pt. Alert and oriented,follows simple instructions, denies pain. Incision area without swelling, redness or S/S of infection. Voiding adequate clear yellow urine. Moving all extremities well and vitals stable and documented. Patient discharged home with spouse. Posterior cervical surgery notes instructions given to patient and family member for home safety and precautions. Pt. and family stated understanding of instructions given. 

## 2013-11-04 NOTE — Anesthesia Preprocedure Evaluation (Signed)
Anesthesia Evaluation  Patient identified by MRN, date of birth, ID band Patient awake    Reviewed: Allergy & Precautions, H&P , NPO status , Patient's Chart, lab work & pertinent test results, reviewed documented beta blocker date and time   History of Anesthesia Complications (+) PONV and history of anesthetic complications  Airway Mallampati: II TM Distance: >3 FB Neck ROM: full    Dental   Pulmonary shortness of breath and with exertion,  breath sounds clear to auscultation        Cardiovascular hypertension, + angina + CAD, + CABG and + Peripheral Vascular Disease + dysrhythmias + Valvular Problems/Murmurs AS Rhythm:regular     Neuro/Psych  Neuromuscular disease negative psych ROS   GI/Hepatic Neg liver ROS, GERD-  Medicated and Controlled,  Endo/Other  negative endocrine ROS  Renal/GU Renal InsufficiencyRenal disease  negative genitourinary   Musculoskeletal   Abdominal   Peds  Hematology negative hematology ROS (+)   Anesthesia Other Findings See surgeon's H&P   Reproductive/Obstetrics negative OB ROS                           Anesthesia Physical Anesthesia Plan  ASA: III  Anesthesia Plan: General   Post-op Pain Management:    Induction: Intravenous  Airway Management Planned: Oral ETT  Additional Equipment:   Intra-op Plan:   Post-operative Plan: Extubation in OR  Informed Consent: I have reviewed the patients History and Physical, chart, labs and discussed the procedure including the risks, benefits and alternatives for the proposed anesthesia with the patient or authorized representative who has indicated his/her understanding and acceptance.   Dental Advisory Given  Plan Discussed with: CRNA and Surgeon  Anesthesia Plan Comments:         Anesthesia Quick Evaluation

## 2013-11-04 NOTE — H&P (Signed)
[name] is an 80 y.o. female.   Chief Complaint: Left neck pain   HPI: 80 year old female status post previous C5-6 anterior cervical discectomy and fusion with significant left-sided neck and shoulder pain. Workup demonstrates evidence of spondylosis and foraminal stenosis on the left side at C4-5. Patient presents now for C4-5 laminotomy and foraminotomy for hopeful improvement in her symptoms.   Past Medical History  Diagnosis Date  . Aortic stenosis     a.  s/p tissue AVR at time of CABG in 2009;  b. Echo 04/2012: EF 55-60%, moderate AS (mean 34);  c. Echo 6/14: Mild LVH, mild focal basal septal hypertrophy, EF 55-60%, normal wall motion, grade 2 diastolic dysfunction, AVR with moderate aortic stenosis (mean 36) - consider TEE if clinically indicated, mild AI, mild MR, PASP 44, ascending aorta mild to moderately dilated (consider CTA or MRA)  . Dyslipidemia   . Hypertension   . Spinal arthritis   . CAD (coronary artery disease)     a. s/p CABG; b. Myoview 06/2011: No ischemia, EF 67%;  c. 01/2013 Cath: LM min irregs, LAD small, LCX 19451m OMs ok, RCA known 100, VG->RCA ok, VG->OM1->2 ok, LIMA->LAD ok.  Marland Kitchen. Hx of CABG     LIMA-LAD, SVG-RCA, SVG-OM1/OM2 in 2009  . Blindness of right eye     due to retinal bleed  . Glaucoma   . Left bundle branch block   . Carotid stenosis     Carotid U/S 5/13:  bilat 40-59%  . Shortness of breath   . Heart murmur   . Chronic kidney disease     renal insufficiency  . GERD (gastroesophageal reflux disease)   . Arthritis   . Blind right eye   . PONV (postoperative nausea and vomiting)     nausea  . HOH (hard of hearing)     Past Surgical History  Procedure Laterality Date  . Coronary artery bypass graft  Jan 2009    LIMA to LAD, SVG to RCA, SVG to OM 1 & 2  . Aortic valve replacement  Jan 2009    #21 mm pericardial prosthesis  . Back surgery    . Neck surgery    . Abdominal hysterectomy    . Cholecystectomy    . Eye surgery    . Cardiac  catheterization  2014  . Appendectomy      Family History  Problem Relation Age of Onset  . Cancer Mother   . Heart attack Father   . Heart attack Brother    Social History:  reports that she has never smoked. She has never used smokeless tobacco. She reports that she does not drink alcohol or use illicit drugs.  Allergies:  Allergies  Allergen Reactions  . Zetia [Ezetimibe] Other (See Comments)    DIZZINESS    Medications Prior to Admission  Medication Sig Dispense Refill  . aspirin 81 MG tablet Take 81 mg by mouth daily.        Marland Kitchen. atorvastatin (LIPITOR) 80 MG tablet TAKE 1 TABLET BY MOUTH EVERY DAY FOR CHOLESTEROL  90 tablet  1  . atorvastatin (LIPITOR) 80 MG tablet Take 80 mg by mouth daily.      . benazepril (LOTENSIN) 20 MG tablet Take 20 mg by mouth daily.      . Calcium Carbonate (CALTRATE 600 PO) Take 1 tablet by mouth daily.       Marland Kitchen. gabapentin (NEURONTIN) 300 MG capsule Take 300 mg by mouth at bedtime.       .Marland Kitchen  hydrochlorothiazide (HYDRODIURIL) 25 MG tablet Take 1 tablet (25 mg total) by mouth daily.  90 tablet  3  . ibuprofen (ADVIL,MOTRIN) 200 MG tablet Take 200 mg by mouth every 6 (six) hours as needed for pain or headache (headache).      Marland Kitchen. NITROSTAT 0.3 MG SL tablet Place 0.3 mg under the tongue every 5 (five) minutes as needed for chest pain.       Marland Kitchen. omeprazole (PRILOSEC) 20 MG capsule Take 1 capsule (20 mg total) by mouth daily.  30 capsule  1  . Phenyleph-Doxylamine-DM-APAP (ALKA-SELTZER PLS NIGHT CLD/FLU PO) Take 1 capsule by mouth daily as needed (for allergies).       . timolol (TIMOPTIC) 0.5 % ophthalmic solution Place 1 drop into both eyes 2 (two) times daily.      . Vitamin D, Ergocalciferol, (DRISDOL) 50000 UNITS CAPS Take 50,000 Units by mouth every 7 (seven) days. On Tuesday      . zolpidem (AMBIEN) 5 MG tablet Take 5 mg by mouth at bedtime as needed for sleep (sleep).      . isosorbide mononitrate (IMDUR) 30 MG 24 hr tablet Take 1 tablet (30 mg total) by  mouth daily.  90 tablet  3    No results found for this or any previous visit (from the past 48 hour(s)). No results found.  Review of Systems  Constitutional: Negative.   HENT: Negative.   Eyes: Negative.   Respiratory: Negative.   Cardiovascular: Negative.   Gastrointestinal: Negative.   Genitourinary: Negative.   Musculoskeletal: Negative.   Skin: Negative.   Neurological: Negative.   Endo/Heme/Allergies: Negative.   Psychiatric/Behavioral: Negative.     Blood pressure 166/47, pulse 70, temperature 98.5 F (36.9 C), resp. rate 18, SpO2 97.00%. Physical Exam  Constitutional: She is oriented to person, place, and time. She appears well-developed and well-nourished. No distress.  HENT:  Head: Normocephalic and atraumatic.  Right Ear: External ear normal.  Left Ear: External ear normal.  Nose: Nose normal.  Mouth/Throat: No oropharyngeal exudate.  Eyes: Conjunctivae and EOM are normal. Pupils are equal, round, and reactive to light. Right eye exhibits no discharge. Left eye exhibits no discharge.  Neck: Normal range of motion. Neck supple. No tracheal deviation present. No thyromegaly present.  Cardiovascular: Normal rate, regular rhythm, normal heart sounds and intact distal pulses.  Exam reveals no friction rub.   No murmur heard. Respiratory: Effort normal and breath sounds normal. No respiratory distress. She has no wheezes.  GI: Soft. Bowel sounds are normal. She exhibits no distension. There is no tenderness.  Musculoskeletal: Normal range of motion. She exhibits no edema and no tenderness.  Neurological: She is alert and oriented to person, place, and time. She has normal reflexes. She displays normal reflexes. No cranial nerve deficit. She exhibits normal muscle tone. Coordination normal.  Skin: Skin is warm and dry. She is not diaphoretic.  Psychiatric: She has a normal mood and affect. Her behavior is normal. Judgment and thought content normal.      Assessment/Plan Left C4-5 spondylosis with radiculopathy. Plan left C4-5 laminotomy and foraminotomy. Risks and benefits of explained. Patient wishes to proceed.  Sherilyn CooterHenry A Breandan People 11/04/2013, 7:43 AM

## 2013-11-04 NOTE — Plan of Care (Signed)
Problem: Consults Goal: Diagnosis - Spinal Surgery Outcome: Completed/Met Date Met:  11/04/13 Cervical Spine Fusion     

## 2013-11-04 NOTE — Anesthesia Postprocedure Evaluation (Signed)
Anesthesia Post Note  Patient: [name]  Procedure(s) Performed: Procedure(s) (LRB): Left Cervical Four-Five Foraminotomy  (Left)  Anesthesia type: General  Patient location: PACU  Post pain: Pain level controlled  Post assessment: Patient's Cardiovascular Status Stable  Last Vitals:  Filed Vitals:   11/04/13 1100  BP:   Pulse: 73  Temp:   Resp: 13    Post vital signs: Reviewed and stable  Level of consciousness: alert  Complications: No apparent anesthesia complications

## 2013-11-04 NOTE — Transfer of Care (Signed)
Immediate Anesthesia Transfer of Care Note  Patient: [name]  Procedure(s) Performed: Procedure(s) with comments: Left Cervical Four-Five Foraminotomy  (Left) - Left Cervical Four-Five Foraminotomy   Patient Location: PACU  Anesthesia Type:General  Level of Consciousness: oriented, sedated, patient cooperative and responds to stimulation  Airway & Oxygen Therapy: Patient Spontanous Breathing and Patient connected to nasal cannula oxygen  Post-op Assessment: Report given to PACU RN, Post -op Vital signs reviewed and stable, Patient moving all extremities and Patient moving all extremities X 4  Post vital signs: Reviewed and stable  Complications: No apparent anesthesia complications

## 2013-11-04 NOTE — Brief Op Note (Signed)
11/04/2013  9:28 AM  PATIENT:  [name]  80 y.o. female  PRE-OPERATIVE DIAGNOSIS:  spondylosis  POST-OPERATIVE DIAGNOSIS:  spondylosis  PROCEDURE:  Procedure(s) with comments: Left Cervical Four-Five Foraminotomy  (Left) - Left Cervical Four-Five Foraminotomy   SURGEON:  Surgeon(s) and Role:    * Temple PaciniHenry A Seriyah Collison, MD - Primary    * Reinaldo Meekerandy O Kritzer, MD - Assisting  PHYSICIAN ASSISTANT:   ASSISTANTS:    ANESTHESIA:   general  EBL:  Total I/O In: 450 [I.V.:450] Out: 50 [Blood:50]  BLOOD ADMINISTERED:none  DRAINS: none   LOCAL MEDICATIONS USED:  MARCAINE     SPECIMEN:  No Specimen  DISPOSITION OF SPECIMEN:  N/A  COUNTS:  YES  TOURNIQUET:  * No tourniquets in log *  DICTATION: .Dragon Dictation  PLAN OF CARE: Admit to inpatient   PATIENT DISPOSITION:  PACU - hemodynamically stable.   Delay start of Pharmacological VTE agent (>24hrs) due to surgical blood loss or risk of bleeding: yes

## 2013-11-08 ENCOUNTER — Encounter (HOSPITAL_COMMUNITY): Payer: Self-pay | Admitting: Neurosurgery

## 2013-11-14 ENCOUNTER — Ambulatory Visit: Payer: Medicare HMO | Admitting: Cardiology

## 2013-12-05 ENCOUNTER — Encounter: Payer: Self-pay | Admitting: Vascular Surgery

## 2013-12-06 ENCOUNTER — Ambulatory Visit (HOSPITAL_COMMUNITY)
Admission: RE | Admit: 2013-12-06 | Discharge: 2013-12-06 | Disposition: A | Discharge status: Home / Self Care | Payer: Medicare HMO | Source: Ambulatory Visit | Attending: Family | Admitting: Family

## 2013-12-06 ENCOUNTER — Ambulatory Visit (HOSPITAL_BASED_OUTPATIENT_CLINIC_OR_DEPARTMENT_OTHER)
Admission: RE | Admit: 2013-12-06 | Discharge: 2013-12-06 | Disposition: A | Discharge status: Home / Self Care | Payer: Medicare HMO | Source: Ambulatory Visit | Attending: Internal Medicine | Admitting: Internal Medicine

## 2013-12-06 ENCOUNTER — Ambulatory Visit (INDEPENDENT_AMBULATORY_CARE_PROVIDER_SITE_OTHER): Payer: Medicare HMO | Admitting: Family

## 2013-12-06 ENCOUNTER — Encounter: Payer: Self-pay | Admitting: Family

## 2013-12-06 VITALS — BP 155/85 | HR 73 | Resp 16 | Ht 61.0 in | Wt 156.0 lb

## 2013-12-06 DIAGNOSIS — I359 Nonrheumatic aortic valve disorder, unspecified: Secondary | ICD-10-CM

## 2013-12-06 DIAGNOSIS — I351 Nonrheumatic aortic (valve) insufficiency: Secondary | ICD-10-CM

## 2013-12-06 DIAGNOSIS — Z953 Presence of xenogenic heart valve: Secondary | ICD-10-CM

## 2013-12-06 DIAGNOSIS — I6529 Occlusion and stenosis of unspecified carotid artery: Secondary | ICD-10-CM

## 2013-12-06 NOTE — Progress Notes (Signed)
Established Carotid Patient   History of Present Illness  [name] is a 80 y.o. female patient of Dr. Darrick Penna followed for carotid stenosis with serial duplex. The patient denies any signs or symptoms of CVA, TIA, amaurosis fugax or any neural deficit. She denies any new medical diagnoses or any recent surgery. Patient is legally blind in her right eye due to glaucoma. Pt denies any history of stroke, her sister had a stroke with severe neurological deficits, per pt.  Patient has not had previous carotid artery intervention. Pt denies any claudication symptoms in legs with walking.  Pt  reports New Medical or Surgical History: c-spine surgery May, 2015. She does not drink ETOH. Had a CABG in 2009, also had aortic valve replaced.  Pt Dia2betic: No Pt smoker: non-smoker  Pt meds include: Statin : Yes ASA: Yes Other anticoagulants/antiplatelets: no   Past Medical History  Diagnosis Date  . Aortic stenosis     a.  s/p tissue AVR at time of CABG in 2009;  b. Echo 04/2012: EF 55-60%, moderate AS (mean 34);  c. Echo 6/14: Mild LVH, mild focal basal septal hypertrophy, EF 55-60%, normal wall motion, grade 2 diastolic dysfunction, AVR with moderate aortic stenosis (mean 36) - consider TEE if clinically indicated, mild AI, mild MR, PASP 44, ascending aorta mild to moderately dilated (consider CTA or MRA)  . Dyslipidemia   . Hypertension   . Spinal arthritis   . CAD (coronary artery disease)     a. s/p CABG; b. Myoview 06/2011: No ischemia, EF 67%;  c. 01/2013 Cath: LM min irregs, LAD small, LCX 112m OMs ok, RCA known 100, VG->RCA ok, VG->OM1->2 ok, LIMA->LAD ok.  Marland Kitchen Hx of CABG     LIMA-LAD, SVG-RCA, SVG-OM1/OM2 in 2009  . Blindness of right eye     due to retinal bleed  . Glaucoma   . Left bundle branch block   . Carotid stenosis     Carotid U/S 5/13:  bilat 40-59%  . Shortness of breath   . Heart murmur   . Chronic kidney disease     renal insufficiency  . GERD  (gastroesophageal reflux disease)   . Arthritis   . Blind right eye   . PONV (postoperative nausea and vomiting)     nausea  . HOH (hard of hearing)     Social History History  Substance Use Topics  . Smoking status: Never Smoker   . Smokeless tobacco: Never Used  . Alcohol Use: No    Family History Family History  Problem Relation Age of Onset  . Cancer Mother   . Heart attack Father   . Heart attack Brother     Surgical History Past Surgical History  Procedure Laterality Date  . Coronary artery bypass graft  Jan 2009    LIMA to LAD, SVG to RCA, SVG to OM 1 & 2  . Aortic valve replacement  Jan 2009    #21 mm pericardial prosthesis  . Back surgery    . Neck surgery    . Abdominal hysterectomy    . Cholecystectomy    . Eye surgery    . Cardiac catheterization  2014  . Appendectomy    . Posterior cervical laminectomy Left 11/04/2013    Procedure: Left Cervical Four-Five Foraminotomy ;  Surgeon: Temple Pacini, MD;  Location: [facility];  Service: Neurosurgery;  Laterality: Left;  Left Cervical Four-Five Foraminotomy     Allergies  Allergen Reactions  . Zetia [Ezetimibe]  Other (See Comments)    DIZZINESS    Current Outpatient Prescriptions  Medication Sig Dispense Refill  . aspirin 81 MG tablet Take 81 mg by mouth daily.        Marland Kitchen atorvastatin (LIPITOR) 80 MG tablet Take 80 mg by mouth daily.      . benazepril (LOTENSIN) 20 MG tablet Take 20 mg by mouth daily.      . Calcium Carbonate (CALTRATE 600 PO) Take 1 tablet by mouth daily.       Marland Kitchen gabapentin (NEURONTIN) 300 MG capsule Take 300 mg by mouth at bedtime.       . hydrochlorothiazide (HYDRODIURIL) 25 MG tablet Take 1 tablet (25 mg total) by mouth daily.  90 tablet  3  . isosorbide mononitrate (IMDUR) 30 MG 24 hr tablet Take 1 tablet (30 mg total) by mouth daily.  90 tablet  3  . NITROSTAT 0.3 MG SL tablet Place 0.3 mg under the tongue every 5 (five) minutes as needed for chest pain.       Marland Kitchen  Phenyleph-Doxylamine-DM-APAP (ALKA-SELTZER PLS NIGHT CLD/FLU PO) Take 1 capsule by mouth daily as needed (for allergies).       . timolol (TIMOPTIC) 0.5 % ophthalmic solution Place 1 drop into both eyes 2 (two) times daily.      . Vitamin D, Ergocalciferol, (DRISDOL) 50000 UNITS CAPS Take 50,000 Units by mouth every 7 (seven) days. On Tuesday      . zolpidem (AMBIEN) 5 MG tablet Take 5 mg by mouth at bedtime as needed for sleep (sleep).      Marland Kitchen HYDROcodone-acetaminophen (NORCO/VICODIN) 5-325 MG per tablet Take 1-2 tablets by mouth every 4 (four) hours as needed for moderate pain.  30 tablet  0  . ibuprofen (ADVIL,MOTRIN) 200 MG tablet Take 200 mg by mouth every 6 (six) hours as needed for pain or headache (headache).      Marland Kitchen omeprazole (PRILOSEC) 20 MG capsule Take 1 capsule (20 mg total) by mouth daily.  30 capsule  1   No current facility-administered medications for this visit.    Review of Systems : See HPI for pertinent positives and negatives.  Physical Examination   Filed Vitals:   12/06/13 1315 12/06/13 1316  BP: 184/60 155/85  Pulse: 74 73  Resp: 16   Height: 5\' 1"  (1.549 m)   Weight: 156 lb (70.761 kg)    Body mass index is 29.49 kg/(m^2).   General: WDWN female in NAD GAIT: normal Eyes: Right pupil is non reactive Pulmonary:  Non-labored, CTAB, Negative  Rales, Negative rhonchi, & Negative wheezing.  Cardiac: regular Rhythm with occasional premature beats, valve click ausculated,  Negative detected murmur.  VASCULAR EXAM Carotid Bruits Left Right   Negative Negative     Radial pulses are 2+ palpable and equal.  LE Pulses LEFT RIGHT       POPLITEAL  not palpable   not palpable       POSTERIOR TIBIAL  not palpable   2+ palpable        DORSALIS PEDIS      ANTERIOR TIBIAL 2+ palpable  2+ palpable     Gastrointestinal: soft, nontender, BS  WNL, no r/g,  negative masses.  Musculoskeletal: Negative muscle atrophy/wasting. M/S 5/5 throughout, Extremities without ischemic changes  Neurologic: A&O X 3; Appropriate Affect ; SENSATION ;normal;  Speech is normal CN 2-12 intact except right pupil is non reactive, Pain and light touch intact in extremities, Motor exam as listed above.   Non-Invasive Vascular Imaging CAROTID DUPLEX 12/06/2013   CEREBROVASCULAR DUPLEX EVALUATION    INDICATION: Carotid artery disease     PREVIOUS INTERVENTION(S):     DUPLEX EXAM:     RIGHT  LEFT  Peak Systolic Velocities (cm/s) End Diastolic Velocities (cm/s) Plaque LOCATION Peak Systolic Velocities (cm/s) End Diastolic Velocities (cm/s) Plaque  68 10  CCA PROXIMAL 73 13   60 9  CCA MID 70 9 HT  77 14 HT CCA DISTAL 68 12 HT  111 0  ECA 132 0   134 31 HT ICA PROXIMAL 64 15 HT  122 31  ICA MID 144 35   81 23  ICA DISTAL 108 31     2.23 ICA / CCA Ratio (PSV) 2.05  Antegrade  Vertebral Flow Antegrade   190 Brachial Systolic Pressure (mmHg) 190  Triphasic  Brachial Artery Waveforms Triphasic     Plaque Morphology:  HM = Homogeneous, HT = Heterogeneous, CP = Calcific Plaque, SP = Smooth Plaque, IP = Irregular Plaque     ADDITIONAL FINDINGS:     IMPRESSION: Bilateral internal carotid artery velocities suggest a <40% stenosis.     Compared to the previous exam:  No significant change in comparison to the last exam on 11/26/2012.      Assessment: [name] is a 80 y.o. female who presents with no recent nor remote history of stroke or TIA. Bilateral internal carotid artery velocities suggest a <40% stenosis.  No significant change in comparison to the last exam on 11/26/2012.   Plan: Follow-up in 1 year with Carotid Duplex scan.   I discussed in depth with the patient the nature of atherosclerosis, and emphasized the importance of maximal medical management including strict control of blood pressure, blood glucose, and lipid levels,  obtaining regular exercise, and continued cessation of smoking.  The patient is aware that without maximal medical management the underlying atherosclerotic disease process will progress, limiting the benefit of any interventions. The patient was given information about stroke prevention and what symptoms should prompt the patient to seek immediate medical care. Thank you for allowing us to participate in this patient's care.  Charisse MarchSuzanne Malikah Lakey, RN, MSN, FNP-C [facility] Office: [phone]  Clinic Physician: Hart RochesterLawson  12/06/2013 1:50 PM

## 2013-12-06 NOTE — Patient Instructions (Signed)

## 2013-12-06 NOTE — Progress Notes (Signed)
2D Echo Performed 12/06/2013    Josanne Boerema, RCS  

## 2013-12-30 NOTE — OR Nursing (Signed)
Addendum to scope page 

## 2014-01-16 DIAGNOSIS — H4010X0 Unspecified open-angle glaucoma, stage unspecified: Secondary | ICD-10-CM | POA: Insufficient documentation

## 2014-01-16 DIAGNOSIS — H353190 Nonexudative age-related macular degeneration, unspecified eye, stage unspecified: Secondary | ICD-10-CM | POA: Insufficient documentation

## 2014-01-19 DIAGNOSIS — H34813 Central retinal vein occlusion, bilateral, with macular edema: Secondary | ICD-10-CM | POA: Insufficient documentation

## 2014-01-31 ENCOUNTER — Other Ambulatory Visit: Payer: Self-pay | Admitting: Cardiology

## 2014-02-07 ENCOUNTER — Other Ambulatory Visit: Payer: Self-pay | Admitting: Cardiology

## 2014-05-01 ENCOUNTER — Encounter: Payer: Self-pay | Admitting: Cardiology

## 2014-05-01 ENCOUNTER — Ambulatory Visit (INDEPENDENT_AMBULATORY_CARE_PROVIDER_SITE_OTHER): Payer: Medicare HMO | Admitting: Cardiology

## 2014-05-01 VITALS — BP 160/70 | HR 57 | Ht 61.0 in | Wt 153.7 lb

## 2014-05-01 DIAGNOSIS — R0602 Shortness of breath: Secondary | ICD-10-CM

## 2014-05-01 NOTE — Progress Notes (Signed)
HPI    Since I last saw her she had neck surgery. She doesn't think is related to her arm pain very much.  She had a cath in 2014 demonstrating native 3 vessel disease but patent grafts.  We elected to manage her medically.  She does have a prosthetic aortic valve that has severe prosthetic valve stenosis. However, she's not had any new symptoms that can be attributed to this. She has her chronic arm pain which is related to cervical neck disease. However, she's not had any new shortness of breath, PND or orthopnea. She doesn't describe any palpitations, presyncope or syncope. She has had no weight gain or edema. She still does yard work and housework.  I did review her hospital records from her recent cervical surgery and she had no apparent cardiac complications.  Allergies  Allergen Reactions  . Zetia [Ezetimibe] Other (See Comments)    DIZZINESS    Current Outpatient Prescriptions  Medication Sig Dispense Refill  . aspirin 81 MG tablet Take 81 mg by mouth daily.        Marland Kitchen. atorvastatin (LIPITOR) 80 MG tablet Take 80 mg by mouth daily.      . benazepril (LOTENSIN) 20 MG tablet Take 20 mg by mouth daily.      . Calcium Carbonate (CALTRATE 600 PO) Take 1 tablet by mouth daily.       . hydrochlorothiazide (HYDRODIURIL) 25 MG tablet TAKE 1 TABLET BY MOUTH EVERY DAY  90 tablet  0  . HYDROcodone-acetaminophen (NORCO/VICODIN) 5-325 MG per tablet Take 1-2 tablets by mouth every 4 (four) hours as needed for moderate pain.  30 tablet  0  . ibuprofen (ADVIL,MOTRIN) 200 MG tablet Take 200 mg by mouth every 6 (six) hours as needed for pain or headache (headache).      Marland Kitchen. NITROSTAT 0.3 MG SL tablet Place 0.3 mg under the tongue every 5 (five) minutes as needed for chest pain.       Marland Kitchen. Phenyleph-Doxylamine-DM-APAP (ALKA-SELTZER PLS NIGHT CLD/FLU PO) Take 1 capsule by mouth daily as needed (for allergies).       . timolol (TIMOPTIC) 0.5 % ophthalmic solution Place 1 drop into both eyes 2 (two) times daily.       . Vitamin D, Ergocalciferol, (DRISDOL) 50000 UNITS CAPS Take 50,000 Units by mouth every 7 (seven) days. On Tuesday      . zolpidem (AMBIEN) 5 MG tablet Take 5 mg by mouth at bedtime as needed for sleep (sleep).       No current facility-administered medications for this visit.    Past Medical History  Diagnosis Date  . Aortic stenosis     a.  s/p tissue AVR at time of CABG in 2009;  b. Echo 04/2012: EF 55-60%, moderate AS (mean 34);  c. Echo 6/14: Mild LVH, mild focal basal septal hypertrophy, EF 55-60%, normal wall motion, grade 2 diastolic dysfunction, AVR with moderate aortic stenosis (mean 36) - consider TEE if clinically indicated, mild AI, mild MR, PASP 44, ascending aorta mild to moderately dilated (consider CTA or MRA)  . Dyslipidemia   . Hypertension   . Spinal arthritis   . CAD (coronary artery disease)     a. s/p CABG; b. Myoview 06/2011: No ischemia, EF 67%;  c. 01/2013 Cath: LM min irregs, LAD small, LCX 11628m OMs ok, RCA known 100, VG->RCA ok, VG->OM1->2 ok, LIMA->LAD ok.  Marland Kitchen. Hx of CABG     LIMA-LAD, SVG-RCA, SVG-OM1/OM2 in 2009  . Blindness  of right eye     due to retinal bleed  . Glaucoma   . Left bundle branch block   . Carotid stenosis     Carotid U/S 5/13:  bilat 40-59%  . Shortness of breath   . Heart murmur   . Chronic kidney disease     renal insufficiency  . GERD (gastroesophageal reflux disease)   . Arthritis   . Blind right eye   . PONV (postoperative nausea and vomiting)     nausea  . HOH (hard of hearing)     Past Surgical History  Procedure Laterality Date  . Coronary artery bypass graft  Jan 2009    LIMA to LAD, SVG to RCA, SVG to OM 1 & 2  . Aortic valve replacement  Jan 2009    #21 mm pericardial prosthesis  . Back surgery    . Neck surgery    . Abdominal hysterectomy    . Cholecystectomy    . Eye surgery    . Cardiac catheterization  2014  . Appendectomy    . Posterior cervical laminectomy Left 11/04/2013    Procedure: Left Cervical  Four-Five Foraminotomy ;  Surgeon: Temple PaciniHenry A Pool, MD;  Location: [facility];  Service: Neurosurgery;  Laterality: Left;  Left Cervical Four-Five Foraminotomy     ROS: As stated in the HPI and negative for all other systems.  PHYSICAL EXAM BP 160/70  Pulse 57  Ht 5\' 1"  (1.549 m)  Wt 153 lb 11.2 oz (69.718 kg)  BMI 29.06 kg/m2 GENERAL:  Well appearing HEENT: Right pupil fixed and lens opacified. Dentures.  NECK:  No jugular venous distention, waveform within normal limits, carotid upstroke brisk and symmetric, no bruits, no thyromegaly LYMPHATICS:  No cervical, inguinal adenopathy LUNGS:  Clear to auscultation bilaterally CHEST:  Well healed sternotomy scar. HEART:  PMI not displaced or sustained,S1 and S2 within normal limits, no S3, no S4, no clicks, no rubs, mid peaking systolic murmur radiating out the outflow tract, 2/6 diastolic murmur, late peaking and radiating heart at the left 4th intercostal space.  ABD:  Flat, positive bowel sounds normal in frequency in pitch, no bruits, no rebound, no guarding, no midline pulsatile mass, no hepatomegaly, no splenomegaly EXT:  2 plus pulses throughout, no edema, no cyanosis no clubbing  EKG:  Sinus rhythm, rate 57, interventricular conduction delay, premature ventricular contractions, no acute ST-T wave changes. No change from previous. 05/01/2014  ASSESSMENT AND PLAN   CAD (coronary artery disease) -  The patient has no new sypmtoms.  No further cardiovascular testing is indicated.  We will continue with aggressive risk reduction and meds as listed.    Hypertension -  Her blood pressure is slightly elevated.  However, at home it is well controlled.   No change in medications is indicated. We will continue with therapeutic lifestyle changes (TLC).   S/P aortic valve replacement with bioprosthetic valve -  She has severe AS and AI. Her mean gradient has increased from 36 in 2014 to 42 most recently in [place]. I reviewed these  echocardiograms. However, she has no symptoms attributable to this. No change in therapy is indicated.  Dyslipidemia -  I have asked her to have this repeated by her primary provider sometime this winter.  Her levels were acceptable last November.    Carotid stenosis - She has nonobstructive carotid stenosis followed by  [facility]. I reviewed these results and this was mild.

## 2014-05-01 NOTE — Patient Instructions (Signed)
Your physician recommends that you schedule a follow-up appointment in: 6 months with Dr. Hochrein  

## 2014-05-02 ENCOUNTER — Other Ambulatory Visit: Payer: Self-pay | Admitting: Cardiology

## 2014-05-15 ENCOUNTER — Other Ambulatory Visit: Payer: Self-pay | Admitting: Cardiology

## 2014-06-15 ENCOUNTER — Encounter (HOSPITAL_COMMUNITY): Payer: Self-pay | Admitting: Cardiology

## 2014-06-23 ENCOUNTER — Ambulatory Visit (INDEPENDENT_AMBULATORY_CARE_PROVIDER_SITE_OTHER): Payer: Medicare HMO | Admitting: Cardiology

## 2014-06-23 ENCOUNTER — Encounter: Payer: Self-pay | Admitting: Cardiology

## 2014-06-23 ENCOUNTER — Encounter: Payer: Self-pay | Admitting: Cardiovascular Disease

## 2014-06-23 VITALS — BP 162/64 | HR 68 | Ht 61.0 in | Wt 152.0 lb

## 2014-06-23 DIAGNOSIS — I10 Essential (primary) hypertension: Secondary | ICD-10-CM

## 2014-06-23 DIAGNOSIS — Z01812 Encounter for preprocedural laboratory examination: Secondary | ICD-10-CM

## 2014-06-23 DIAGNOSIS — R079 Chest pain, unspecified: Secondary | ICD-10-CM

## 2014-06-23 DIAGNOSIS — I2510 Atherosclerotic heart disease of native coronary artery without angina pectoris: Secondary | ICD-10-CM

## 2014-06-23 NOTE — Patient Instructions (Addendum)
Pt. To have cath week after christmas with Dr. Copper  And consider left radial   Your physician recommends that you schedule a follow-up appointment in: after cath is completed  Have your labs done 7 days before your cath

## 2014-06-23 NOTE — Progress Notes (Signed)
HPI    Since I last saw her she continues to have neck and arm pain as described previously. Unfortunately this has continued and her surgeon who did neck surgery does not think it is related to any cervical neck disease. She describes a couple of episodes. She said she was on a cruise in November and every [place] [employer] she would get this neck and arm discomfort. More recently she had some severe discomfort that was 10 out of 10 when she was sweeping some leaves. It lasted for about half hour and was similar to the angina she had prior to bypass. She'll get short of breath with this but she would not have any nausea vomiting or diaphoresis. She's not having any new shortness of breath, PND or orthopnea. She's not describing any palpitations, presyncope or syncope. She was quite distressed about this discomfort.  Allergies  Allergen Reactions  . Zetia [Ezetimibe] Other (See Comments)    DIZZINESS    Current Outpatient Prescriptions  Medication Sig Dispense Refill  . aspirin 81 MG tablet Take 81 mg by mouth daily.      Marland Kitchen. atorvastatin (LIPITOR) 80 MG tablet Take 80 mg by mouth daily.    . benazepril (LOTENSIN) 20 MG tablet Take 20 mg by mouth daily.    . Calcium Carbonate (CALTRATE 600 PO) Take 1 tablet by mouth daily.     . hydrochlorothiazide (HYDRODIURIL) 25 MG tablet TAKE 1 TABLET BY MOUTH EVERY DAY 90 tablet 3  . HYDROcodone-acetaminophen (NORCO/VICODIN) 5-325 MG per tablet Take 1-2 tablets by mouth every 4 (four) hours as needed for moderate pain. 30 tablet 0  . ibuprofen (ADVIL,MOTRIN) 200 MG tablet Take 200 mg by mouth every 6 (six) hours as needed for pain or headache (headache).    Marland Kitchen. NITROSTAT 0.3 MG SL tablet Place 0.3 mg under the tongue every 5 (five) minutes as needed for chest pain.     Marland Kitchen. Phenyleph-Doxylamine-DM-APAP (ALKA-SELTZER PLS NIGHT CLD/FLU PO) Take 1 capsule by mouth daily as needed (for allergies).     . timolol (TIMOPTIC) 0.5 %  ophthalmic solution Place 1 drop into both eyes 2 (two) times daily.    . Vitamin D, Ergocalciferol, (DRISDOL) 50000 UNITS CAPS Take 50,000 Units by mouth every 7 (seven) days. On Tuesday    . zolpidem (AMBIEN) 5 MG tablet Take 5 mg by mouth at bedtime as needed for sleep (sleep).     No current facility-administered medications for this visit.    Past Medical History  Diagnosis Date  . Aortic stenosis     a.  s/p tissue AVR at time of CABG in 2009;  b. Echo 04/2012: EF 55-60%, moderate AS (mean 34);  c. Echo 6/14: Mild LVH, mild focal basal septal hypertrophy, EF 55-60%, normal wall motion, grade 2 diastolic dysfunction, AVR with moderate aortic stenosis (mean 36) - consider TEE if clinically indicated, mild AI, mild MR, PASP 44, ascending aorta mild to moderately dilated (consider CTA or MRA)  . Dyslipidemia   . Hypertension   . Spinal arthritis   . CAD (coronary artery disease)     a. s/p CABG; b. Myoview 06/2011: No ischemia, EF 67%;  c. 01/2013 Cath: LM min irregs, LAD small, LCX 1343m OMs ok, RCA known 100, VG->RCA ok, VG->OM1->2 ok, LIMA->LAD ok.  Marland Kitchen. Hx of CABG     LIMA-LAD, SVG-RCA, SVG-OM1/OM2 in 2009  . Blindness of right eye     due to  retinal bleed  . Glaucoma   . Left bundle branch block   . Carotid stenosis     Carotid U/S 5/13:  bilat 40-59%  . Shortness of breath   . Heart murmur   . Chronic kidney disease     renal insufficiency  . GERD (gastroesophageal reflux disease)   . Arthritis   . Blind right eye   . PONV (postoperative nausea and vomiting)     nausea  . HOH (hard of hearing)     Past Surgical History  Procedure Laterality Date  . Coronary artery bypass graft  Jan 2009    LIMA to LAD, SVG to RCA, SVG to OM 1 & 2  . Aortic valve replacement  Jan 2009    #21 mm pericardial prosthesis  . Back surgery    . Neck surgery    . Abdominal hysterectomy    . Cholecystectomy    . Eye surgery    . Cardiac catheterization  2014  . Appendectomy    . Posterior  cervical laminectomy Left 11/04/2013    Procedure: Left Cervical Four-Five Foraminotomy ;  Surgeon: Temple PaciniHenry A Pool, MD;  Location: [facility];  Service: Neurosurgery;  Laterality: Left;  Left Cervical Four-Five Foraminotomy   . Left and right heart catheterization with coronary/graft angiogram N/A 01/13/2013    Procedure: LEFT AND RIGHT HEART CATHETERIZATION WITH Isabel CapriceORONARY/GRAFT ANGIOGRAM;  Surgeon: Rollene RotundaJames Kanyah Matsushima, MD;  [facility];  Service: Cardiovascular;  Laterality: N/A;    ROS: As stated in the HPI and negative for all other systems.  PHYSICAL EXAM BP 162/64 mmHg  Pulse 68  Ht 5\' 1"  (1.549 m)  Wt 152 lb (68.947 kg)  BMI 28.74 kg/m2 GENERAL:  Well appearing HEENT: Right pupil fixed and lens opacified. Dentures.  NECK:  No jugular venous distention, waveform within normal limits, carotid upstroke brisk and symmetric, no bruits, no thyromegaly LYMPHATICS:  No cervical, inguinal adenopathy LUNGS:  Clear to auscultation bilaterally CHEST:  Well healed sternotomy scar. HEART:  PMI not displaced or sustained,S1 and S2 within normal limits, no S3, no S4, no clicks, no rubs, mid peaking systolic murmur radiating out the outflow tract, 2/6 diastolic murmur, late peaking and radiating heart at the left 4th intercostal space.  ABD:  Flat, positive bowel sounds normal in frequency in pitch, no bruits, no rebound, no guarding, no midline pulsatile mass, no hepatomegaly, no splenomegaly EXT:  2 plus pulses throughout, no edema, no cyanosis no clubbing  EKG:  Sinus rhythm, rate 68, interventricular conduction delay, premature ventricular contractions, no acute ST-T wave changes. No change from previous. 06/23/2014  ASSESSMENT AND PLAN   CAD (coronary artery disease) -  Given this discomfort that is ongoing in the comment from her surgeon that it is not related to her cervical neck disease we need to again consider a cardiac source. Given the severity of this I will call this at least class  III angina. I will send her for left heart catheterization. She had some difficulty with groin bleed previously and we could consider left radial access. We might also need to further consider any contribution from progressive bioprosthetic aortic valve stenosis.  If there is no clear cardiac etiology to her complaints. He might need to consider redo valve surgery or TAVR.  The patient understands that risks included but are not limited to stroke (1 in 1000), death (1 in 1000), kidney failure [usually temporary] (1 in 500), bleeding (1 in 200), allergic reaction [possibly serious] (1  in 200).  The patient understands and agrees to proceed.   Hypertension -  Her blood pressure is slightly elevated.  However, at home it is well controlled.   No change in medications is indicated. We will continue with therapeutic lifestyle changes (TLC).   S/P aortic valve replacement with bioprosthetic valve -  She has severe AS and AI. Her mean gradient has increased from 36 in 2014 to 42 most recently in [place]. This will first be evaluated as above with consideration of need for valve replacement redo surgery.  Dyslipidemia -  This is managed per Tarri Fuller, MD  Carotid stenosis - She has nonobstructive carotid stenosis followed by  [facility]. I reviewed these results and this was mild.

## 2014-06-27 ENCOUNTER — Other Ambulatory Visit: Payer: Self-pay | Admitting: *Deleted

## 2014-06-27 ENCOUNTER — Ambulatory Visit
Admission: RE | Admit: 2014-06-27 | Discharge: 2014-06-27 | Disposition: A | Discharge status: Home / Self Care | Payer: Medicare HMO | Source: Ambulatory Visit | Attending: Cardiology | Admitting: Cardiology

## 2014-06-27 DIAGNOSIS — Z01812 Encounter for preprocedural laboratory examination: Secondary | ICD-10-CM

## 2014-06-28 LAB — COMPLETE METABOLIC PANEL WITH GFR
ALK PHOS: 90 U/L (ref 39–117)
ALT: 14 U/L (ref 0–35)
AST: 26 U/L (ref 0–37)
Albumin: 4.3 g/dL (ref 3.5–5.2)
BILIRUBIN TOTAL: 1.1 mg/dL (ref 0.2–1.2)
BUN: 23 mg/dL (ref 6–23)
CO2: 25 mEq/L (ref 19–32)
Calcium: 10.1 mg/dL (ref 8.4–10.5)
Chloride: 102 mEq/L (ref 96–112)
Creat: 1.46 mg/dL — ABNORMAL HIGH (ref 0.50–1.10)
GFR, Est African American: 39 mL/min — ABNORMAL LOW
GFR, Est Non African American: 34 mL/min — ABNORMAL LOW
GLUCOSE: 98 mg/dL (ref 70–99)
POTASSIUM: 4.5 meq/L (ref 3.5–5.3)
SODIUM: 136 meq/L (ref 135–145)
TOTAL PROTEIN: 6.9 g/dL (ref 6.0–8.3)

## 2014-06-28 LAB — URINALYSIS, MICROSCOPIC ONLY
BACTERIA UA: NONE SEEN
CASTS: NONE SEEN
CRYSTALS: NONE SEEN
Squamous Epithelial / LPF: NONE SEEN

## 2014-06-28 LAB — APTT: aPTT: 32 seconds (ref 24–37)

## 2014-06-28 LAB — CBC
HCT: 34.7 % — ABNORMAL LOW (ref 36.0–46.0)
Hemoglobin: 11.7 g/dL — ABNORMAL LOW (ref 12.0–15.0)
MCH: 28.7 pg (ref 26.0–34.0)
MCHC: 33.7 g/dL (ref 30.0–36.0)
MCV: 85 fL (ref 78.0–100.0)
MPV: 10.2 fL (ref 9.4–12.4)
PLATELETS: 217 10*3/uL (ref 150–400)
RBC: 4.08 MIL/uL (ref 3.87–5.11)
RDW: 15.8 % — AB (ref 11.5–15.5)
WBC: 7.8 10*3/uL (ref 4.0–10.5)

## 2014-06-28 LAB — PROTIME-INR
INR: 0.96 (ref <1.50)
Prothrombin Time: 12.8 seconds (ref 11.6–15.2)

## 2014-07-03 ENCOUNTER — Ambulatory Visit (HOSPITAL_COMMUNITY)
Admission: RE | Admit: 2014-07-03 | Discharge: 2014-07-03 | Disposition: A | Discharge status: Home / Self Care | Payer: Medicare HMO | Source: Ambulatory Visit | Attending: Cardiovascular Disease | Admitting: Cardiovascular Disease

## 2014-07-03 ENCOUNTER — Encounter (HOSPITAL_COMMUNITY)
Admission: RE | Disposition: A | Discharge status: Home / Self Care | Payer: Self-pay | Source: Ambulatory Visit | Attending: Cardiovascular Disease

## 2014-07-03 ENCOUNTER — Encounter (HOSPITAL_COMMUNITY): Payer: Self-pay | Admitting: Cardiovascular Disease

## 2014-07-03 DIAGNOSIS — I6523 Occlusion and stenosis of bilateral carotid arteries: Secondary | ICD-10-CM | POA: Insufficient documentation

## 2014-07-03 DIAGNOSIS — Z791 Long term (current) use of non-steroidal anti-inflammatories (NSAID): Secondary | ICD-10-CM | POA: Diagnosis not present

## 2014-07-03 DIAGNOSIS — M4690 Unspecified inflammatory spondylopathy, site unspecified: Secondary | ICD-10-CM | POA: Insufficient documentation

## 2014-07-03 DIAGNOSIS — Z7982 Long term (current) use of aspirin: Secondary | ICD-10-CM | POA: Insufficient documentation

## 2014-07-03 DIAGNOSIS — N189 Chronic kidney disease, unspecified: Secondary | ICD-10-CM | POA: Diagnosis not present

## 2014-07-03 DIAGNOSIS — Z951 Presence of aortocoronary bypass graft: Secondary | ICD-10-CM | POA: Insufficient documentation

## 2014-07-03 DIAGNOSIS — K219 Gastro-esophageal reflux disease without esophagitis: Secondary | ICD-10-CM | POA: Diagnosis not present

## 2014-07-03 DIAGNOSIS — I25810 Atherosclerosis of coronary artery bypass graft(s) without angina pectoris: Secondary | ICD-10-CM | POA: Diagnosis not present

## 2014-07-03 DIAGNOSIS — E785 Hyperlipidemia, unspecified: Secondary | ICD-10-CM | POA: Diagnosis not present

## 2014-07-03 DIAGNOSIS — R079 Chest pain, unspecified: Secondary | ICD-10-CM | POA: Diagnosis present

## 2014-07-03 DIAGNOSIS — I350 Nonrheumatic aortic (valve) stenosis: Secondary | ICD-10-CM

## 2014-07-03 DIAGNOSIS — Z953 Presence of xenogenic heart valve: Secondary | ICD-10-CM | POA: Insufficient documentation

## 2014-07-03 DIAGNOSIS — I129 Hypertensive chronic kidney disease with stage 1 through stage 4 chronic kidney disease, or unspecified chronic kidney disease: Secondary | ICD-10-CM | POA: Diagnosis not present

## 2014-07-03 DIAGNOSIS — I2510 Atherosclerotic heart disease of native coronary artery without angina pectoris: Secondary | ICD-10-CM

## 2014-07-03 HISTORY — PX: LEFT HEART CATHETERIZATION WITH CORONARY/GRAFT ANGIOGRAM: SHX5450

## 2014-07-03 SURGERY — LEFT HEART CATHETERIZATION WITH CORONARY/GRAFT ANGIOGRAM
Anesthesia: LOCAL

## 2014-07-03 MED ORDER — SODIUM CHLORIDE 0.9 % IJ SOLN: 3.0000 mL | Freq: Two times a day (BID) | INTRAMUSCULAR | Status: DC

## 2014-07-03 MED ORDER — ONDANSETRON HCL 4 MG/2ML IJ SOLN: 4.0000 mg | Freq: Four times a day (QID) | INTRAMUSCULAR | Status: DC | PRN

## 2014-07-03 MED ORDER — HEPARIN SODIUM (PORCINE) 1000 UNIT/ML IJ SOLN
INTRAMUSCULAR | Status: AC
  Filled 2014-07-03: qty 1

## 2014-07-03 MED ORDER — VERAPAMIL HCL 2.5 MG/ML IV SOLN
INTRAVENOUS | Status: AC
  Filled 2014-07-03: qty 2

## 2014-07-03 MED ORDER — SODIUM CHLORIDE 0.9 % IJ SOLN: 3.0000 mL | INTRAMUSCULAR | Status: DC | PRN

## 2014-07-03 MED ORDER — MIDAZOLAM HCL 2 MG/2ML IJ SOLN
INTRAMUSCULAR | Status: AC
  Filled 2014-07-03: qty 2

## 2014-07-03 MED ORDER — HEPARIN (PORCINE) IN NACL 2-0.9 UNIT/ML-% IJ SOLN
INTRAMUSCULAR | Status: AC
  Filled 2014-07-03: qty 1500

## 2014-07-03 MED ORDER — ACETAMINOPHEN 325 MG PO TABS: 650.0000 mg | ORAL_TABLET | ORAL | Status: DC | PRN

## 2014-07-03 MED ORDER — FENTANYL CITRATE 0.05 MG/ML IJ SOLN
INTRAMUSCULAR | Status: AC
  Filled 2014-07-03: qty 2

## 2014-07-03 MED ORDER — SODIUM CHLORIDE 0.9 % IV SOLN
INTRAVENOUS | Status: DC
  Administered 2014-07-03: 08:00:00 via INTRAVENOUS

## 2014-07-03 MED ORDER — SODIUM CHLORIDE 0.9 % IV SOLN: 250.0000 mL | INTRAVENOUS | Status: DC | PRN

## 2014-07-03 MED ORDER — LIDOCAINE HCL (PF) 1 % IJ SOLN
INTRAMUSCULAR | Status: AC
  Filled 2014-07-03: qty 30

## 2014-07-03 MED ORDER — SODIUM CHLORIDE 0.9 % IV SOLN: 1.0000 mL/kg/h | INTRAVENOUS | Status: DC

## 2014-07-03 MED ORDER — NITROGLYCERIN 1 MG/10 ML FOR IR/CATH LAB
INTRA_ARTERIAL | Status: AC
  Filled 2014-07-03: qty 10

## 2014-07-03 MED ORDER — ASPIRIN 81 MG PO CHEW: 81.0000 mg | CHEWABLE_TABLET | ORAL | Status: DC

## 2014-07-03 NOTE — CV Procedure (Signed)
    Cardiac Catheterization Procedure Note  Name: [name] MRN: [id number] DOB: 01/30/1933  Procedure: Left Heart Cath, Selective Coronary Angiography, saphenous vein graft angiography, LIMA angiography.  Indication: Progressive symptoms of angina, CCS class III. This 80 year old woman has a history of coronary and aortic valve disease. She underwent bioprosthetic aortic valve replacement with a 21 mm Mitroflow bioprosthetic valve and multivessel CABG in 2009. She did well after surgery with resolution of her symptoms until the past year. She has now developed recurrent neck and arm pain which was her previous anginal equivalent. This now occurs with low-level physical activity.   Procedural Details: The left wrist was prepped, draped, and anesthetized with 1% lidocaine. Using the modified Seldinger technique, a 5/6 French Slender sheath was introduced into the left radial artery. 3 mg of verapamil was administered through the sheath, weight-based unfractionated heparin was administered intravenously. Standard Judkins catheters were used for selective coronary angiography and left ventriculography. Catheter exchanges were performed over an exchange length guidewire. There were no immediate procedural complications. A TR band was used for radial hemostasis at the completion of the procedure.  The patient was transferred to the post catheterization recovery area for further monitoring.  Procedural Findings: Hemodynamics: AO 196/25 LV 162/56 with a mean of 90 Aortic mean gradient 39 mmHg  Coronary angiography: Coronary dominance: right  Left mainstem: The left main stem is ectatic. There is heavy calcification without obstructive disease.  Left anterior descending (LAD): The LAD is severely calcified. The vessel has 90% proximal stenosis. The first diagonal is seen to fill faintly. The mid and distal LAD fill from the mammary graft.  Left circumflex (LCx): The left circumflex appears to  have severe ostial stenosis. This is estimated at 80%. The mid circumflex is totally occluded. The obtuse marginal branches fill entirely from a sequential saphenous vein graft.  Right coronary artery (RCA): Severe calcification with total occlusion proximally.  LIMA to LAD: Widely patent to the mid LAD. The mid and distal LAD are small in caliber. The proximal LAD fills retrograde from the graft and this fills the diagonal branch.  Saphenous vein graft sequential to OM1 and OM 2: Widely patent with graft ectasia noted. There are diffuse irregularities. There are no significant stenoses. Multiple OM subbranch is fill from this bypass graft.  Saphenous vein graft to distal RCA: Patent throughout. No significant stenosis identified. The native RCA fills retrograde from the graft. The PDA and PLA branches fill from the graft.  Left ventriculography: Ventriculography was deferred because of kidney disease. Plain fluoroscopy demonstrates severe calcification and restricted mobility of prosthetic aortic valve leaflets.  Estimated Blood Loss: Minimal  Final Conclusions:   1. Severe native three-vessel coronary artery disease 2. Status post multivessel CABG with continued patency of the LIMA to LAD, sequential saphenous vein graft to OM1 and OM 2, and saphenous vein graft to distal RCA 3. Severe bioprosthetic aortic valve stenosis  Recommendations: TCTS evaluation for redo AVR versus valve-in-valve TAVR.   Tonny BollmanMichael Marasia Newhall MD, [facility] Hospital[facility]ACC 07/03/2014, 9:47 AM

## 2014-07-03 NOTE — Interval H&P Note (Signed)
History and Physical Interval Note:  07/03/2014 8:45 AM  [name]  has presented today for surgery, with the diagnosis of cp  The various methods of treatment have been discussed with the patient and family. After consideration of risks, benefits and other options for treatment, the patient has consented to  Procedure(s): LEFT HEART CATHETERIZATION WITH CORONARY/GRAFT ANGIOGRAM (N/A) as a surgical intervention .  The patient's history has been reviewed, patient examined, no change in status, stable for surgery.  I have reviewed the patient's chart and labs.  Questions were answered to the patient's satisfaction.    Cath Lab Visit (complete for each Cath Lab visit)  Clinical Evaluation Leading to the Procedure:   ACS: No.  Non-ACS:    Anginal Classification: CCS III  Anti-ischemic medical therapy: No Therapy  Non-Invasive Test Results: No non-invasive testing performed  Prior CABG: Previous CABG       Tonny BollmanMichael Caden Fatica

## 2014-07-03 NOTE — Discharge Instructions (Signed)
Radial Site Care °Refer to this sheet in the next few weeks. These instructions provide you with information on caring for yourself after your procedure. Your caregiver may also give you more specific instructions. Your treatment has been planned according to current medical practices, but problems sometimes occur. Call your caregiver if you have any problems or questions after your procedure. °HOME CARE INSTRUCTIONS °· You may shower the day after the procedure. Remove the bandage (dressing) and gently wash the site with plain soap and water. Gently pat the site dry. °· Do not apply powder or lotion to the site. °· Do not submerge the affected site in water for 3 to 5 days. °· Inspect the site at least twice daily. °· Do not flex or bend the affected arm for 24 hours. °· No lifting over 5 pounds (2.3 kg) for 5 days after your procedure. °· Do not drive home if you are discharged the same day of the procedure. Have someone else drive you. °· You may drive 24 hours after the procedure unless otherwise instructed by your caregiver. °· Do not operate machinery or power tools for 24 hours. °· A responsible adult should be with you for the first 24 hours after you arrive home. °What to expect: °· Any bruising will usually fade within 1 to 2 weeks. °· Blood that collects in the tissue (hematoma) may be painful to the touch. It should usually decrease in size and tenderness within 1 to 2 weeks. °SEEK IMMEDIATE MEDICAL CARE IF: °· You have unusual pain at the radial site. °· You have redness, warmth, swelling, or pain at the radial site. °· You have drainage (other than a small amount of blood on the dressing). °· You have chills. °· You have a fever or persistent symptoms for more than 72 hours. °· You have a fever and your symptoms suddenly get worse. °· Your arm becomes pale, cool, tingly, or numb. °· You have heavy bleeding from the site. Hold pressure on the site and call 911. °Document Released: 07/26/2010 Document  Revised: 09/15/2011 Document Reviewed: 07/26/2010 °ExitCare® Patient Information ©2015 [employer]. This information is not intended to replace advice given to you by your health care provider. Make sure you discuss any questions you have with your health care provider. ° °

## 2014-07-03 NOTE — H&P (View-Only) (Signed)
HPI    Since I last saw her she continues to have neck and arm pain as described previously. Unfortunately this has continued and her surgeon who did neck surgery does not think it is related to any cervical neck disease. She describes a couple of episodes. She said she was on a cruise in November and every time she would walk [place] [employer] she would get this neck and arm discomfort. More recently she had some severe discomfort that was 10 out of 10 when she was sweeping some leaves. It lasted for about half hour and was similar to the angina she had prior to bypass. She'll get short of breath with this but she would not have any nausea vomiting or diaphoresis. She's not having any new shortness of breath, PND or orthopnea. She's not describing any palpitations, presyncope or syncope. She was quite distressed about this discomfort.  Allergies  Allergen Reactions  . Zetia [Ezetimibe] Other (See Comments)    DIZZINESS    Current Outpatient Prescriptions  Medication Sig Dispense Refill  . aspirin 81 MG tablet Take 81 mg by mouth daily.      Marland Kitchen. atorvastatin (LIPITOR) 80 MG tablet Take 80 mg by mouth daily.    . benazepril (LOTENSIN) 20 MG tablet Take 20 mg by mouth daily.    . Calcium Carbonate (CALTRATE 600 PO) Take 1 tablet by mouth daily.     . hydrochlorothiazide (HYDRODIURIL) 25 MG tablet TAKE 1 TABLET BY MOUTH EVERY DAY 90 tablet 3  . HYDROcodone-acetaminophen (NORCO/VICODIN) 5-325 MG per tablet Take 1-2 tablets by mouth every 4 (four) hours as needed for moderate pain. 30 tablet 0  . ibuprofen (ADVIL,MOTRIN) 200 MG tablet Take 200 mg by mouth every 6 (six) hours as needed for pain or headache (headache).    Marland Kitchen. NITROSTAT 0.3 MG SL tablet Place 0.3 mg under the tongue every 5 (five) minutes as needed for chest pain.     Marland Kitchen. Phenyleph-Doxylamine-DM-APAP (ALKA-SELTZER PLS NIGHT CLD/FLU PO) Take 1 capsule by mouth daily as needed (for allergies).     . timolol (TIMOPTIC) 0.5 %  ophthalmic solution Place 1 drop into both eyes 2 (two) times daily.    . Vitamin D, Ergocalciferol, (DRISDOL) 50000 UNITS CAPS Take 50,000 Units by mouth every 7 (seven) days. On Tuesday    . zolpidem (AMBIEN) 5 MG tablet Take 5 mg by mouth at bedtime as needed for sleep (sleep).     No current facility-administered medications for this visit.    Past Medical History  Diagnosis Date  . Aortic stenosis     a.  s/p tissue AVR at time of CABG in 2009;  b. Echo 04/2012: EF 55-60%, moderate AS (mean 34);  c. Echo 6/14: Mild LVH, mild focal basal septal hypertrophy, EF 55-60%, normal wall motion, grade 2 diastolic dysfunction, AVR with moderate aortic stenosis (mean 36) - consider TEE if clinically indicated, mild AI, mild MR, PASP 44, ascending aorta mild to moderately dilated (consider CTA or MRA)  . Dyslipidemia   . Hypertension   . Spinal arthritis   . CAD (coronary artery disease)     a. s/p CABG; b. Myoview 06/2011: No ischemia, EF 67%;  c. 01/2013 Cath: LM min irregs, LAD small, LCX 1343m OMs ok, RCA known 100, VG->RCA ok, VG->OM1->2 ok, LIMA->LAD ok.  Marland Kitchen. Hx of CABG     LIMA-LAD, SVG-RCA, SVG-OM1/OM2 in 2009  . Blindness of right eye     due to  retinal bleed  . Glaucoma   . Left bundle branch block   . Carotid stenosis     Carotid U/S 5/13:  bilat 40-59%  . Shortness of breath   . Heart murmur   . Chronic kidney disease     renal insufficiency  . GERD (gastroesophageal reflux disease)   . Arthritis   . Blind right eye   . PONV (postoperative nausea and vomiting)     nausea  . HOH (hard of hearing)     Past Surgical History  Procedure Laterality Date  . Coronary artery bypass graft  Jan 2009    LIMA to LAD, SVG to RCA, SVG to OM 1 & 2  . Aortic valve replacement  Jan 2009    #21 mm pericardial prosthesis  . Back surgery    . Neck surgery    . Abdominal hysterectomy    . Cholecystectomy    . Eye surgery    . Cardiac catheterization  2014  . Appendectomy    . Posterior  cervical laminectomy Left 11/04/2013    Procedure: Left Cervical Four-Five Foraminotomy ;  Surgeon: Temple PaciniHenry A Pool, MD;  Location: [facility];  Service: Neurosurgery;  Laterality: Left;  Left Cervical Four-Five Foraminotomy   . Left and right heart catheterization with coronary/graft angiogram N/A 01/13/2013    Procedure: LEFT AND RIGHT HEART CATHETERIZATION WITH Isabel CapriceORONARY/GRAFT ANGIOGRAM;  Surgeon: Rollene RotundaJames Henny Strauch, MD;  [facility];  Service: Cardiovascular;  Laterality: N/A;    ROS: As stated in the HPI and negative for all other systems.  PHYSICAL EXAM BP 162/64 mmHg  Pulse 68  Ht 5\' 1"  (1.549 m)  Wt 152 lb (68.947 kg)  BMI 28.74 kg/m2 GENERAL:  Well appearing HEENT: Right pupil fixed and lens opacified. Dentures.  NECK:  No jugular venous distention, waveform within normal limits, carotid upstroke brisk and symmetric, no bruits, no thyromegaly LYMPHATICS:  No cervical, inguinal adenopathy LUNGS:  Clear to auscultation bilaterally CHEST:  Well healed sternotomy scar. HEART:  PMI not displaced or sustained,S1 and S2 within normal limits, no S3, no S4, no clicks, no rubs, mid peaking systolic murmur radiating out the outflow tract, 2/6 diastolic murmur, late peaking and radiating heart at the left 4th intercostal space.  ABD:  Flat, positive bowel sounds normal in frequency in pitch, no bruits, no rebound, no guarding, no midline pulsatile mass, no hepatomegaly, no splenomegaly EXT:  2 plus pulses throughout, no edema, no cyanosis no clubbing  EKG:  Sinus rhythm, rate 68, interventricular conduction delay, premature ventricular contractions, no acute ST-T wave changes. No change from previous. 06/23/2014  ASSESSMENT AND PLAN   CAD (coronary artery disease) -  Given this discomfort that is ongoing in the comment from her surgeon that it is not related to her cervical neck disease we need to again consider a cardiac source. Given the severity of this I will call this at least class  III angina. I will send her for left heart catheterization. She had some difficulty with groin bleed previously and we could consider left radial access. We might also need to further consider any contribution from progressive bioprosthetic aortic valve stenosis.  If there is no clear cardiac etiology to her complaints. He might need to consider redo valve surgery or TAVR.  The patient understands that risks included but are not limited to stroke (1 in 1000), death (1 in 1000), kidney failure [usually temporary] (1 in 500), bleeding (1 in 200), allergic reaction [possibly serious] (1  in 200).  The patient understands and agrees to proceed.   Hypertension -  Her blood pressure is slightly elevated.  However, at home it is well controlled.   No change in medications is indicated. We will continue with therapeutic lifestyle changes (TLC).   S/P aortic valve replacement with bioprosthetic valve -  She has severe AS and AI. Her mean gradient has increased from 36 in 2014 to 42 most recently in [place]. This will first be evaluated as above with consideration of need for valve replacement redo surgery.  Dyslipidemia -  This is managed per Tarri Fuller, MD  Carotid stenosis - She has nonobstructive carotid stenosis followed by  [facility]. I reviewed these results and this was mild.

## 2014-07-12 ENCOUNTER — Encounter: Payer: Self-pay | Admitting: Thoracic Surgery (Cardiothoracic Vascular Surgery)

## 2014-07-12 ENCOUNTER — Institutional Professional Consult (permissible substitution) (INDEPENDENT_AMBULATORY_CARE_PROVIDER_SITE_OTHER): Payer: Medicare HMO | Admitting: Thoracic Surgery (Cardiothoracic Vascular Surgery)

## 2014-07-12 VITALS — BP 158/69 | HR 76 | Resp 16 | Ht 61.0 in | Wt 150.0 lb

## 2014-07-12 DIAGNOSIS — Z9889 Other specified postprocedural states: Secondary | ICD-10-CM

## 2014-07-12 DIAGNOSIS — Z8679 Personal history of other diseases of the circulatory system: Secondary | ICD-10-CM

## 2014-07-12 DIAGNOSIS — I350 Nonrheumatic aortic (valve) stenosis: Secondary | ICD-10-CM

## 2014-07-12 NOTE — Progress Notes (Signed)
PCP is Tarri FullerESCAJEDA, RICHARD, MD Referring Provider is Micheline Chapmanooper, Michael D, MD  Chief Complaint  Patient presents with  . Aortic Stenosis    severe bioprosthetic AVS...CATHED 07/03/14...last ECHO 12/06/13...eval for REDO AVR vs. VALVE IN VALVE TAVR    HPI: 81 year old woman sent for consultation regarding bioprosthetic aortic valve stenosis.  Mrs. Delaney MeigsStegenga is a 81 year old woman who has a complicated medical history. She had coronary bypass grafting 4 and aortic valve replacement with a 21 mm Mitroflow pericardial prosthesis and septal myomectomy in January 2009. She did well following that operation until recently. For about the past 2 years she's been having occasional exertional left arm pain sometimes radiates to the left neck and jaw. This would come on with moderate to heavy exertion and was always relieved with resting 5 minutes or less. It was not causing her significant distress until about 2 weeks ago when she had an episode that was prolonged and more severe. This lasted approximately 40 minutes. He was accompanied by a sensation of impending doom. She is not sure if she was short of breath or diaphoretic.  She had an echocardiogram back in [place] 2015 which showed prosthetic valve aortic stenosis with a valve area of 0.82 cm. Her ejection fraction was 55-60%.  After her episode of prolonged chest pain she had cardiac catheterization done by Dr. Excell Seltzerooper. It showed severe native three-vessel coronary disease. Her grafts were patent with no limitation of flow.  She has not had another episode of prolonged pain. She has not had any nocturnal or rest pain.   Past Medical History  Diagnosis Date  . Aortic stenosis     a.  s/p tissue AVR at time of CABG in 2009;  b. Echo 04/2012: EF 55-60%, moderate AS (mean 34);  c. Echo 6/14: Mild LVH, mild focal basal septal hypertrophy, EF 55-60%, normal wall motion, grade 2 diastolic dysfunction, AVR with moderate aortic stenosis (mean 36) - consider TEE if  clinically indicated, mild AI, mild MR, PASP 44, ascending aorta mild to moderately dilated (consider CTA or MRA)  . Dyslipidemia   . Hypertension   . Spinal arthritis   . CAD (coronary artery disease)     a. s/p CABG; b. Myoview 06/2011: No ischemia, EF 67%;  c. 01/2013 Cath: LM min irregs, LAD small, LCX 14589m OMs ok, RCA known 100, VG->RCA ok, VG->OM1->2 ok, LIMA->LAD ok.  Marland Kitchen. Hx of CABG     LIMA-LAD, SVG-RCA, SVG-OM1/OM2 in 2009  . Blindness of right eye     due to retinal bleed  . Glaucoma   . Left bundle branch block   . Carotid stenosis     Carotid U/S 5/13:  bilat 40-59%  . Shortness of breath   . Heart murmur   . Chronic kidney disease     renal insufficiency  . GERD (gastroesophageal reflux disease)   . Arthritis   . Blind right eye   . PONV (postoperative nausea and vomiting)     nausea  . HOH (hard of hearing)     Past Surgical History  Procedure Laterality Date  . Coronary artery bypass graft  Jan 2009    LIMA to LAD, SVG to RCA, SVG to OM 1 & 2  . Aortic valve replacement  Jan 2009    #21 mm pericardial prosthesis  . Back surgery    . Neck surgery    . Abdominal hysterectomy    . Cholecystectomy    . Eye surgery    . Cardiac  catheterization  2014  . Appendectomy    . Posterior cervical laminectomy Left 11/04/2013    Procedure: Left Cervical Four-Five Foraminotomy ;  Surgeon: Temple Pacini, MD;  Location: [facility];  Service: Neurosurgery;  Laterality: Left;  Left Cervical Four-Five Foraminotomy   . Left and right heart catheterization with coronary/graft angiogram N/A 01/13/2013    Procedure: LEFT AND RIGHT HEART CATHETERIZATION WITH Isabel Caprice;  Surgeon: [name], MD;  Location: [facility] CATH LAB;  Service: Cardiovascular;  Laterality: N/A;  . Left heart catheterization with coronary/graft angiogram N/A 07/03/2014    Procedure: LEFT HEART CATHETERIZATION WITH Isabel Caprice;  Surgeon: Micheline Chapman, MD;  Location: [facility] CATH LAB;   Service: Cardiovascular;  Laterality: N/A;    Family History  Problem Relation Age of Onset  . Cancer Mother   . Heart attack Father   . Heart attack Brother     Social History History  Substance Use Topics  . Smoking status: Never Smoker   . Smokeless tobacco: Never Used  . Alcohol Use: No    Current Outpatient Prescriptions  Medication Sig Dispense Refill  . aspirin 81 MG tablet Take 81 mg by mouth daily.      Marland Kitchen atorvastatin (LIPITOR) 80 MG tablet Take 80 mg by mouth daily.    . benazepril (LOTENSIN) 20 MG tablet Take 20 mg by mouth daily.    . Calcium Carbonate (CALTRATE 600 PO) Take 1 tablet by mouth daily.     . hydrochlorothiazide (HYDRODIURIL) 25 MG tablet TAKE 1 TABLET BY MOUTH EVERY DAY 90 tablet 3  . ibuprofen (ADVIL,MOTRIN) 200 MG tablet Take 200 mg by mouth every 6 (six) hours as needed for pain or headache (headache).    Marland Kitchen NITROSTAT 0.3 MG SL tablet Place 0.3 mg under the tongue every 5 (five) minutes as needed for chest pain.     Marland Kitchen timolol (TIMOPTIC) 0.5 % ophthalmic solution Place 1 drop into both eyes 2 (two) times daily.    . Vitamin D, Ergocalciferol, (DRISDOL) 50000 UNITS CAPS Take 50,000 Units by mouth every 7 (seven) days. On Tuesday    . zolpidem (AMBIEN) 5 MG tablet Take 5 mg by mouth at bedtime as needed for sleep (sleep).     No current facility-administered medications for this visit.    Allergies  Allergen Reactions  . Zetia [Ezetimibe] Other (See Comments)    DIZZINESS    Review of Systems  Constitutional: Positive for fatigue. Negative for fever, chills and unexpected weight change.  Eyes: Positive for visual disturbance (blind right eye).  Respiratory: Positive for shortness of breath.   Cardiovascular: Positive for chest pain (Left arm and neck pain).  Musculoskeletal: Positive for back pain (neck surgery- Dr. Dutch Quint).  Neurological: Positive for dizziness.  All other systems reviewed and are negative.   BP 158/69 mmHg  Pulse 76  Resp  16  Ht  (1.549 m)  Wt 150 lb (68.04 kg)  BMI 28.36 kg/m2  SpO2 98% Physical Exam  Constitutional: She is oriented to person, place, and time. No distress.  obese  HENT:  Head: Normocephalic and atraumatic.  Eyes:  Left eye EOMI,   Neck: Neck supple. No thyromegaly present.  Murmur v transmitted bruit on right  Cardiovascular: Normal rate, regular rhythm and intact distal pulses.   Murmur (3/6 systolic) heard. Pulmonary/Chest: Effort normal and breath sounds normal. She has no wheezes. She has no rales.  Abdominal: Soft. There is no tenderness.  Musculoskeletal: She exhibits no edema.  Lymphadenopathy:    She has no cervical adenopathy.  Neurological: She is alert and oriented to person, place, and time.  No focal motor deficit  Skin: Skin is warm and dry.  Vitals reviewed.    Diagnostic Tests: ECHOCARDIOGRAM 12/06/2013 Study Conclusions  - Left ventricle: The cavity size was normal. Wall thickness was increased in a pattern of moderate LVH. Systolic function was normal. The estimated ejection fraction was in the range of 55% to 60%. Wall motion was normal; there were no regional wall motion abnormalities. Doppler parameters are consistent with abnormal left ventricular relaxation (grade 1 diastolic dysfunction). - Aortic valve: Trileaflet; severely calcified leaflets. There was severe stenosis. There was mild regurgitation. Mean gradient (S): 42 mm Hg. Peak gradient (S): 64 mm Hg. Valve area (VTI): 0.82 cm^2. - Mitral valve: Mildly to moderately calcified annulus. Mildly calcified leaflets . There was trivial regurgitation. - Left atrium: The atrium was mildly dilated. - Right ventricle: The cavity size was normal. Systolic function was normal. - Tricuspid valve: Peak RV-RA gradient (S): 31 mm Hg. - Pulmonary arteries: PA peak pressure: 34 mm Hg (S). - Inferior vena cava: The vessel was normal in size. The respirophasic diameter changes  were in the normal range (= 50%), consistent with normal central venous pressure.  Impressions:  - Normal LV size with moderate LV hypertrophy. EF 55-60%. Normal RV size and systolic function. Severe aortic stenosis with mild aortic insufficiency.  CARDIAC CATHETERIZATION Coronary angiography: Coronary dominance: right  Left mainstem: The left main stem is ectatic. There is heavy calcification without obstructive disease.  Left anterior descending (LAD): The LAD is severely calcified. The vessel has 90% proximal stenosis. The first diagonal is seen to fill faintly. The mid and distal LAD fill from the mammary graft.  Left circumflex (LCx): The left circumflex appears to have severe ostial stenosis. This is estimated at 80%. The mid circumflex is totally occluded. The obtuse marginal branches fill entirely from a sequential saphenous vein graft.  Right coronary artery (RCA): Severe calcification with total occlusion proximally.  LIMA to LAD: Widely patent to the mid LAD. The mid and distal LAD are small in caliber. The proximal LAD fills retrograde from the graft and this fills the diagonal branch.  Saphenous vein graft sequential to OM1 and OM 2: Widely patent with graft ectasia noted. There are diffuse irregularities. There are no significant stenoses. Multiple OM subbranch is fill from this bypass graft.  Saphenous vein graft to distal RCA: Patent throughout. No significant stenosis identified. The native RCA fills retrograde from the graft. The PDA and PLA branches fill from the graft.  Left ventriculography: Ventriculography was deferred because of kidney disease. Plain fluoroscopy demonstrates severe calcification and restricted mobility of prosthetic aortic valve leaflets.  Estimated Blood Loss: Minimal  Final Conclusions:  1. Severe native three-vessel coronary artery disease 2. Status post multivessel CABG with continued patency of the LIMA to LAD, sequential saphenous  vein graft to OM1 and OM 2, and saphenous vein graft to distal RCA 3. Severe bioprosthetic aortic valve stenosis  Recommendations: TCTS evaluation for redo AVR versus valve-in-valve TAVR.    Impression: 81 year old woman with severe, symptomatic prosthetic valve aortic stenosis with patent bypass grafts. She is a very small woman who had a small aortic root. I was only able to fit a 21 mm Mitroflow valve at her first operation. Redo sternotomy for aortic valve replacement via conventional techniques with be a high risk undertaking which would likely require a root enlargement if  not a root replacement.  She would be at significant risk for injury to her existing bypass grafts and therefore perioperative MI.  Given her advanced age, the redo nature of the procedure, her comorbidities, including a creatinine of 1.5, I think she would be best served by TAVR if the Mitroflow can except a valve in valve prosthesis.  I discussed this with Dr. Excell Seltzer and I will arrange for her to be seen in the TAVR clinic for formal evaluation.

## 2014-07-14 ENCOUNTER — Encounter: Payer: Medicare HMO | Admitting: Surgery

## 2014-07-18 ENCOUNTER — Encounter: Payer: Self-pay | Admitting: Thoracic Surgery (Cardiothoracic Vascular Surgery)

## 2014-07-18 ENCOUNTER — Other Ambulatory Visit: Payer: Self-pay | Admitting: *Deleted

## 2014-07-18 ENCOUNTER — Institutional Professional Consult (permissible substitution) (INDEPENDENT_AMBULATORY_CARE_PROVIDER_SITE_OTHER): Payer: Medicare HMO | Admitting: Thoracic Surgery (Cardiothoracic Vascular Surgery)

## 2014-07-18 VITALS — BP 134/75 | HR 78 | Resp 20 | Ht 61.0 in | Wt 150.0 lb

## 2014-07-18 DIAGNOSIS — N289 Disorder of kidney and ureter, unspecified: Secondary | ICD-10-CM

## 2014-07-18 DIAGNOSIS — Z951 Presence of aortocoronary bypass graft: Secondary | ICD-10-CM

## 2014-07-18 DIAGNOSIS — I350 Nonrheumatic aortic (valve) stenosis: Secondary | ICD-10-CM

## 2014-07-18 DIAGNOSIS — I97110 Postprocedural cardiac insufficiency following cardiac surgery: Secondary | ICD-10-CM

## 2014-07-18 DIAGNOSIS — T8209XA Other mechanical complication of heart valve prosthesis, initial encounter: Secondary | ICD-10-CM | POA: Insufficient documentation

## 2014-07-18 DIAGNOSIS — I200 Unstable angina: Secondary | ICD-10-CM

## 2014-07-18 DIAGNOSIS — Z954 Presence of other heart-valve replacement: Secondary | ICD-10-CM

## 2014-07-18 DIAGNOSIS — I25110 Atherosclerotic heart disease of native coronary artery with unstable angina pectoris: Secondary | ICD-10-CM

## 2014-07-18 DIAGNOSIS — Z953 Presence of xenogenic heart valve: Secondary | ICD-10-CM

## 2014-07-18 DIAGNOSIS — T8201XA Breakdown (mechanical) of heart valve prosthesis, initial encounter: Secondary | ICD-10-CM

## 2014-07-18 NOTE — Progress Notes (Signed)
[facility]  MULTIDISCIPLINARY HEART VALVE CLINIC    CARDIOTHORACIC SURGERY CONSULTATION REPORT  Primary Cardiologist is Rollene RotundaHochrein, James, MD  Referring Provider is Loreli SlotHendrickson, Steven C, MD PCP is Tarri FullerESCAJEDA, RICHARD, MD  Chief Complaint  Patient presents with  . 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  The patient was first told she had a heart murmur many years ago, and she was diagnosed with mild to moderate aortic stenosis based on transthoracic echocardiograms. In 2009 she presented with accelerating symptoms of exertional pain localized to her left shoulder and arm that radiated up into her neck. Nuclear stress test was abnormal and suggestive of reversible myocardial ischemia.  Echocardiogram at that time demonstrated the presence of moderate aortic stenosis with mean transvalvular gradient 17 mmHg by echo. Left ventricular systolic function was normal. She underwent cardiac catheterization demonstrating the presence of severe three-vessel coronary artery disease.  She underwent aortic valve replacement using a 21 mm Sorin Mitroflow bioprosthetic tissue valve and coronary artery bypass grafting 4 by Dr. Dorris FetchHendrickson. Findings at the time of surgery were notable for the presence of a relatively small sized aortic root.  A septal myomectomy was performed as well. The patient's early postoperative recovery was notable for the development of postoperative atrial fibrillation and a left pleural effusion requiring thoracentesis. She otherwise  recovered uneventfully and her symptoms of pain in her shoulder and neck resolved.  The patient states that she did well until approximately 2 years ago when she first began to experience a recurrence of similar symptoms of pain in her left shoulder and arm that are brought on with physical exertion and relieved by rest. Symptoms are described as a dull pain that oftentimes radiates up her neck to the left side of her jaw. Symptoms are at times associated with exertional shortness of breath. Over the past 2 years symptoms have progressed, particularly over the last few months. This has dramatically limited her physical activity.  The patient underwent uncomplicated left sided C4-5 laminotomy and foraminotomy for decompression of cervical spondylosis with radiculitis by Dr. Dutch QuintPoole in May 2015. The patient recovered from this surgery uneventfully, but her symptoms of left shoulder and arm pain have persisted. Given the nature of the symptoms and the lack of corresponding radiographic evidence to suggest the possibility that her symptoms are related to her degenerative disease of the cervical spine, the patient was referred back to Dr. Antoine PocheHochrein to reconsider possible cardiac etiology. Follow-up transthoracic echocardiogram performed 12/06/2013 revealed severe prosthetic valve dysfunction involving the patient's aortic valve with severe recurrent aortic stenosis and mild aortic insufficiency. Peak velocity across the aortic valve measured in excess of 4 m/s corresponding to peak and mean transvalvular gradients estimated 64 and 40 mmHg, respectively. Left ventricular systolic function was preserved with ejection fraction estimated 55-60%.  Over the past 6 months the patient's symptoms have continued to accelerate.  She now gets exertional pain in her shoulder with minimal activity and occasionally at rest.   She was seen in follow-up by Dr. Antoine PocheHochrein in early December and subsequently scheduled for diagnostic cardiac  catheterization. This was performed by  Dr. Excell Seltzer 07/03/2014. Catheterization confirmed the presence of severe prosthetic valve aortic stenosis with mean transvalvular gradient 39 mmHg and at least mild aortic insufficiency. The patient had severe native three-vessel coronary artery disease, but all of her previously placed bypass grafts remained widely patent and free of any significant disease. The patient was referred for surgical consultation and evaluated last week by Dr. Dorris Fetch. The patient is felt to be high risk for conventional surgical redo aortic valve replacement, and she has been referred for a second surgical opinion to consider valve-in-valve transcatheter aortic valve replacement as an alternative.  The patient is married and lives with her husband in [place], [place].  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  She has remained physically active until the last 2 years during which time she has experienced gradual aggressive decline in her function status because of the exertional pain in her left shoulder and arm as noted previously. The patient otherwise does not have significant physical limitations. She states that she now gets exertional pain in her left shoulder which radiates up and down her left arm and up the left side of her neck on nearly a daily basis. The pain is described as a dull pain, and it is very similar to the symptoms the patient experienced prior to her heart surgery in 2009.  Symptoms are currently brought on with very mild activity and occasionally occur at rest. They are usually relieved by rest. She gets fatigued very easily.  She has had 1 or 2 episodes where she has awoke from her sleep with pain and had to sit up in bed. She has had frequent dizzy spells without syncope. She has some mild shortness of breath associated with severe episodes of pain. She otherwise denies  exertional shortness of breath. She denies any history of PND, orthopnea, or lower extremity edema.  Past Medical History  Diagnosis Date  . Aortic stenosis     a.  s/p tissue AVR at time of CABG in 2009;  b. Echo 04/2012: EF 55-60%, moderate AS (mean 34);  c. Echo 6/14: Mild LVH, mild focal basal septal hypertrophy, EF 55-60%, normal wall motion, grade 2 diastolic dysfunction, AVR with moderate aortic stenosis (mean 36) - consider TEE if clinically indicated, mild AI, mild MR, PASP 44, ascending aorta mild to moderately dilated (consider CTA or MRA)  . Dyslipidemia   . Hypertension   . Spinal arthritis   . CAD (coronary artery disease)     a. s/p CABG; b. Myoview 06/2011: No ischemia, EF 67%;  c. 01/2013 Cath: LM min irregs, LAD small, LCX 152m OMs ok, RCA known 100, VG->RCA ok, VG->OM1->2 ok, LIMA->LAD ok.  Marland Kitchen Hx of CABG     LIMA-LAD, SVG-RCA, SVG-OM1/OM2 in 2009  . Blindness of right eye     due to retinal bleed  . Glaucoma   . Left bundle branch block   . Carotid stenosis     Carotid U/S 5/13:  bilat 40-59%  . Shortness of breath   . Heart murmur   . Chronic kidney disease     renal insufficiency  . GERD (gastroesophageal reflux disease)   . Arthritis   . Blind right eye   . PONV (postoperative nausea and vomiting)     nausea  . HOH (hard of hearing)   . S/P aortic valve replacement with bioprosthetic valve 07/28/2007    #62mm Sorin Mitroflow  pericardial tissue valve    . S/P CABG x 4 07/28/2007    LIMA to LAD, SVG to RCA, sequential SVG to OM1-OM2  . Unstable angina 01/14/2013  . Occlusion and stenosis of carotid artery without mention of cerebral infarction 11/28/2011  . Aortic stenosis, severe 07/03/2014    Recurrent, due to prosthetic valve stenosis   . Prosthetic valve dysfunction     Past Surgical History  Procedure Laterality Date  . Coronary artery bypass graft  Jan 2009    LIMA to LAD, SVG to RCA, SVG to OM 1 & 2  . Aortic valve replacement  Jan 2009    #21 mm  pericardial prosthesis  . Back surgery    . Neck surgery    . Abdominal hysterectomy    . Cholecystectomy    . Eye surgery    . Cardiac catheterization  2014  . Appendectomy    . Posterior cervical laminectomy Left 11/04/2013    Procedure: Left Cervical Four-Five Foraminotomy ;  Surgeon: Temple Pacini, MD;  Location: [facility];  Service: Neurosurgery;  Laterality: Left;  Left Cervical Four-Five Foraminotomy   . Left and right heart catheterization with coronary/graft angiogram N/A 01/13/2013    Procedure: LEFT AND RIGHT HEART CATHETERIZATION WITH Isabel Caprice;  Surgeon: [name], MD;  Location: [facility] CATH LAB;  Service: Cardiovascular;  Laterality: N/A;  . Left heart catheterization with coronary/graft angiogram N/A 07/03/2014    Procedure: LEFT HEART CATHETERIZATION WITH Isabel Caprice;  Surgeon: Micheline Chapman, MD;  Location: [facility] CATH LAB;  Service: Cardiovascular;  Laterality: N/A;    Family History  Problem Relation Age of Onset  . Cancer Mother   . Heart attack Father   . Heart attack Brother     History   Social History  . Marital Status: Married    Spouse Name: N/A    Number of Children: N/A  . Years of Education: N/A   Occupational History  . Not on file.   Social History Main Topics  . Smoking status: Never Smoker   . Smokeless tobacco: Never Used  . Alcohol Use: No  . Drug Use: No  . Sexual Activity: Not on file   Other Topics Concern  . Not on file   Social History Narrative    Current Outpatient Prescriptions  Medication Sig Dispense Refill  . aspirin 81 MG tablet Take 81 mg by mouth daily.      Marland Kitchen atorvastatin (LIPITOR) 80 MG tablet Take 80 mg by mouth daily.    . benazepril (LOTENSIN) 20 MG tablet Take 20 mg by mouth daily.    . Calcium Carbonate (CALTRATE 600 PO) Take 1 tablet by mouth daily.     . hydrochlorothiazide (HYDRODIURIL) 25 MG tablet TAKE 1 TABLET BY MOUTH EVERY DAY 90 tablet 3  . ibuprofen (ADVIL,MOTRIN) 200 MG  tablet Take 200 mg by mouth every 6 (six) hours as needed for pain or headache (headache).    Marland Kitchen NITROSTAT 0.3 MG SL tablet Place 0.3 mg under the tongue every 5 (five) minutes as needed for chest pain.     Marland Kitchen timolol (TIMOPTIC) 0.5 % ophthalmic solution Place 1 drop into both eyes 2 (two) times daily.    . Vitamin D, Ergocalciferol, (DRISDOL) 50000 UNITS CAPS Take 50,000 Units by mouth every 7 (seven) days. On Tuesday    . zolpidem (AMBIEN) 5 MG tablet Take 5 mg by mouth at bedtime as needed for sleep (sleep).     No current  facility-administered medications for this visit.    Allergies  Allergen Reactions  . Zetia [Ezetimibe] Other (See Comments)    DIZZINESS      Review of Systems:   General:  normal appetite, decreased energy, no weight gain, no weight loss, no fever  Cardiac:  + chest pain with exertion, + chest pain at rest, + SOB with exertion, no resting SOB, no PND, no orthopnea, no palpitations, no arrhythmia, no atrial fibrillation, no LE edema, + dizzy spells, no syncope  Respiratory:  no shortness of breath, no home oxygen, no productive cough, no dry cough, no bronchitis, no wheezing, no hemoptysis, no asthma, no pain with inspiration or cough, no sleep apnea, no CPAP at night  GI:   no difficulty swallowing, no reflux, no frequent heartburn, no hiatal hernia, no abdominal pain, no constipation, no diarrhea, no hematochezia, no hematemesis, no melena  GU:   no dysuria,  no frequency, no urinary tract infection, no hematuria, no kidney stones, + mild kidney disease  Vascular:  no pain suggestive of claudication, no pain in feet, no leg cramps, no varicose veins, no DVT, no non-healing foot ulcer  Neuro:   no stroke, no TIA's, no seizures, no headaches, no temporary blindness one eye,  no slurred speech, no peripheral neuropathy, no chronic pain, no instability of gait, no memory/cognitive dysfunction  Musculoskeletal: + mild arthritis in both hands, no joint swelling, no  myalgias, no difficulty walking, normal mobility   Skin:   no rash, no itching, no skin infections, no pressure sores or ulcerations  Psych:   no anxiety, no depression, no nervousness, no unusual recent stress  Eyes:   Diminished vision in left eye, otherwise no blurry vision, no floaters, no recent vision changes, + wears glasses or contacts  ENT:   no hearing loss, no loose or painful teeth, no dentures  Hematologic:  + easy bruising, no abnormal bleeding, no clotting disorder, no frequent epistaxis  Endocrine:  no diabetes, does not check CBG's at home           Physical Exam:   BP 134/75 mmHg  Pulse 78  Resp 20  Ht 5\' 1"  (1.549 m)  Wt 150 lb (68.04 kg)  BMI 28.36 kg/m2  SpO2 98%  General:  Elderly but o/w well-appearing  HEENT:  Unremarkable   Neck:   no JVD, no bruits, no adenopathy   Chest:   clear to auscultation, symmetrical breath sounds, no wheezes, no rhonchi   CV:   RRR, grade IV/VI crescendo/decrescendo murmur heard best at RUSB,  + early diastolic murmur  Abdomen:  soft, non-tender, no masses   Extremities:  warm, well-perfused, pulses diminished, no LE edema  Rectal/GU  Deferred  Neuro:   Grossly non-focal and symmetrical throughout  Skin:   Clean and dry, no rashes, no breakdown   Diagnostic Tests:  Transthoracic Echocardiography  Patient:  [name], [name] MR #:    [id number] Study Date: 12/06/2013 Gender:   F Age:    81 Height:   154.9 cm Weight:   70.3 kg BSA:    1.76 m^2 Pt. Status: Room:  Avelina Laine Hochrein REFERRING  [facility] REFERRING  Tarri Fuller ATTENDING  Zoila Shutter MD SONOGRAPHER Clearence Ped, RCS PERFORMING  [facility], Outpatient  cc:  ------------------------------------------------------------------- LV EF: 55% -  60%  ------------------------------------------------------------------- Indications:   424.1 Aortic valve  disorders.  ------------------------------------------------------------------- History:  PMH: s/p AVR, Carotid Stenosis. Coronary artery disease. Risk factors: Hypertension. Dyslipidemia.  -------------------------------------------------------------------  Study Conclusions  - Left ventricle: The cavity size was normal. Wall thickness was increased in a pattern of moderate LVH. Systolic function was normal. The estimated ejection fraction was in the range of 55% to 60%. Wall motion was normal; there were no regional wall motion abnormalities. Doppler parameters are consistent with abnormal left ventricular relaxation (grade 1 diastolic dysfunction). - Aortic valve: Trileaflet; severely calcified leaflets. There was severe stenosis. There was mild regurgitation. Mean gradient (S): 42 mm Hg. Peak gradient (S): 64 mm Hg. Valve area (VTI): 0.82 cm^2. - Mitral valve: Mildly to moderately calcified annulus. Mildly calcified leaflets . There was trivial regurgitation. - Left atrium: The atrium was mildly dilated. - Right ventricle: The cavity size was normal. Systolic function was normal. - Tricuspid valve: Peak RV-RA gradient (S): 31 mm Hg. - Pulmonary arteries: PA peak pressure: 34 mm Hg (S). - Inferior vena cava: The vessel was normal in size. The respirophasic diameter changes were in the normal range (= 50%), consistent with normal central venous pressure.  Impressions:  - Normal LV size with moderate LV hypertrophy. EF 55-60%. Normal RV size and systolic function. Severe aortic stenosis with mild aortic insufficiency.  Transthoracic echocardiography. M-mode, complete 2D, spectral Doppler, and color Doppler. Birthdate: Patient birthdate: 1933-05-01. Age: Patient is 81 yr old. Sex: Gender: female. Height: Height: 154.9 cm. Height: 61 in. Weight: Weight: 70.3 kg. Weight: 154.7 lb. Body mass index: BMI: 29.3 kg/m^2. Body surface  area:  BSA: 1.76 m^2. Blood pressure:   189/75 Patient status: Outpatient. Study date: Study date: 12/06/2013. Study time: 10:09 AM. Location: Echo laboratory.  -------------------------------------------------------------------  ------------------------------------------------------------------- Left ventricle: The cavity size was normal. Wall thickness was increased in a pattern of moderate LVH. Systolic function was normal. The estimated ejection fraction was in the range of 55% to 60%. Wall motion was normal; there were no regional wall motion abnormalities. Doppler parameters are consistent with abnormal left ventricular relaxation (grade 1 diastolic dysfunction).  ------------------------------------------------------------------- Aortic valve:  Trileaflet; severely calcified leaflets. Doppler: There was severe stenosis.  There was mild regurgitation. Valve area (VTI): 0.82 cm^2. Indexed valve area (VTI): 0.46 cm^2/m^2. Valve area (Vmax): 0.74 cm^2. Indexed valve area (Vmax): 0.42 cm^2/m^2.  Mean gradient (S): 42 mm Hg. Peak gradient (S): 64 mm Hg.  ------------------------------------------------------------------- Aorta: Aortic root: The aortic root was normal in size. Ascending aorta: The ascending aorta was normal in size.  ------------------------------------------------------------------- Mitral valve:  Mildly to moderately calcified annulus. Mildly calcified leaflets . Doppler:  There was no evidence for stenosis.  There was trivial regurgitation.  Peak gradient (D): 2 mm Hg.  ------------------------------------------------------------------- Left atrium: The atrium was mildly dilated.  ------------------------------------------------------------------- Right ventricle: The cavity size was normal. Systolic function was normal.  ------------------------------------------------------------------- Pulmonic valve:  Structurally normal  valve.  Cusp separation was normal. Doppler: Transvalvular velocity was within the normal range. There was no regurgitation.  ------------------------------------------------------------------- Right atrium: The atrium was normal in size.  ------------------------------------------------------------------- Pericardium: There was no pericardial effusion.  ------------------------------------------------------------------- Systemic veins: Inferior vena cava: The vessel was normal in size. The respirophasic diameter changes were in the normal range (= 50%), consistent with normal central venous pressure. Diameter: 15 mm.  ------------------------------------------------------------------- Prepared and Electronically Authenticated by  Marca Ancona, M.D. 2015-06-02T16:30:29  ------------------------------------------------------------------- Measurements  IVC              Value     12/07/2012 Reference ID               15  mm    ---------- ---------  Left ventricle         Value     12/07/2012 Reference LV ID, ED, PLAX    (N)   45  mm    35.5    43 - 52 chordal LV ID, ES, PLAX    (N)   28.3 mm    26.4    23 - 38 chordal LV fx shortening, PLAX (N)   37  %    26     >=29 chordal LV PW thickness, ED      14.9 mm    10.73   --------- IVS/LV PW ratio, ED  (N)   1.03      1.36    <=1.3 Stroke volume, 2D       85  ml    ---------- --------- Stroke volume/bsa, 2D     48  ml/m^2  ---------- --------- LV e&', lateral         4.72 cm/s   5.04    --------- LV E/e&', lateral        16.21     26.79   --------- LV e&', medial         3.84 cm/s   4.72    --------- LV E/e&', medial        19.92     28.6    --------- LV e&', average         4.28 cm/s   ----------  --------- LV E/e&', average        17.87     ---------- ---------  Ventricular septum       Value     12/07/2012 Reference IVS thickness, ED       15.4 mm    14.62   ---------  LVOT              Value     12/07/2012 Reference LVOT ID, S           19  mm    ---------- --------- LVOT area           2.84 cm^2   ---------- ---------  Aortic valve          Value     12/07/2012 Reference Aortic valve peak       401  cm/s   395    --------- velocity, S Aortic valve mean       296  cm/s   287    --------- velocity, S Aortic valve VTI, S      108  cm    98.6    --------- Aortic mean gradient,     40  mm Hg  36     --------- S Aortic peak gradient,     64  mm Hg  62     --------- S Aortic valve area, VTI     0.82 cm^2   ---------- --------- Aortic valve area/bsa,     0.46 cm^2/m^2 ---------- --------- VTI Aortic valve area,       0.74 cm^2   ---------- --------- peak velocity Aortic valve area/bsa,     0.42 cm^2/m^2 ---------- --------- peak velocity Aortic regurg pressure     334  ms    436    --------- half-time  Aorta             Value     12/07/2012 Reference Aortic root ID, ED       34  mm    34     ---------  Left atrium  Value     12/07/2012 Reference LA ID/bsa, A-P     (H)   2.21 cm/m^2  2.2    <=2.2  Mitral valve          Value     12/07/2012 Reference Mitral E-wave peak       76.5 cm/s   135    --------- velocity Mitral A-wave peak       112  cm/s   85.9    --------- velocity Mitral deceleration  (H)   296  ms    268    150 - 230 time Mitral peak gradient,     2   mm Hg  7     --------- D Mitral  E/A ratio, peak     0.7      1.6    ---------  Pulmonary arteries       Value     12/07/2012 Reference PA pressure, S, DP   (H)   34  mm Hg  ---------- <=30  Tricuspid valve        Value     12/07/2012 Reference Tricuspid regurg peak     278  cm/s   311    --------- velocity Tricuspid peak RV-RA      31  mm Hg  39     --------- gradient Tricuspid maximal       278  cm/s   ---------- --------- regurg velocity, PISA  Right ventricle        Value     12/07/2012 Reference RV s&', lateral, S       7.79 cm/s   7.57    ---------  Legend: (L) and (H) mark values outside specified reference range.  (N) marks values inside specified reference range.      Cardiac Catheterization Procedure Note  Name: [name] MRN: [id number] DOB: [birth year]  Procedure: Left Heart Cath, Selective Coronary Angiography, saphenous vein graft angiography, LIMA angiography.  Indication: Progressive symptoms of angina, CCS class III. This 81 year old woman has a history of coronary and aortic valve disease. She underwent bioprosthetic aortic valve replacement with a 21 mm Mitroflow bioprosthetic valve and multivessel CABG in 2009. She did well after surgery with resolution of her symptoms until the past year. She has now developed recurrent neck and arm pain which was her previous anginal equivalent. This now occurs with low-level physical activity.  Procedural Details: The left wrist was prepped, draped, and anesthetized with 1% lidocaine. Using the modified Seldinger technique, a 5/6 French Slender sheath was introduced into the left radial artery. 3 mg of verapamil was administered through the sheath, weight-based unfractionated heparin was administered intravenously. Standard Judkins catheters were used for selective coronary angiography and left  ventriculography. Catheter exchanges were performed over an exchange length guidewire. There were no immediate procedural complications. A TR band was used for radial hemostasis at the completion of the procedure. The patient was transferred to the post catheterization recovery area for further monitoring.  Procedural Findings: Hemodynamics: AO 196/25 LV 162/56 with a mean of 90 Aortic mean gradient 39 mmHg  Coronary angiography: Coronary dominance: right  Left mainstem: The left main stem is ectatic. There is heavy calcification without obstructive disease.  Left anterior descending (LAD): The LAD is severely calcified. The vessel has 90% proximal stenosis. The first diagonal is seen to fill faintly. The mid and distal LAD fill from the mammary graft.  Left circumflex (LCx): The left circumflex appears to have severe ostial stenosis. This is  estimated at 80%. The mid circumflex is totally occluded. The obtuse marginal branches fill entirely from a sequential saphenous vein graft.  Right coronary artery (RCA): Severe calcification with total occlusion proximally.  LIMA to LAD: Widely patent to the mid LAD. The mid and distal LAD are small in caliber. The proximal LAD fills retrograde from the graft and this fills the diagonal branch.  Saphenous vein graft sequential to OM1 and OM 2: Widely patent with graft ectasia noted. There are diffuse irregularities. There are no significant stenoses. Multiple OM subbranch is fill from this bypass graft.  Saphenous vein graft to distal RCA: Patent throughout. No significant stenosis identified. The native RCA fills retrograde from the graft. The PDA and PLA branches fill from the graft.  Left ventriculography: Ventriculography was deferred because of kidney disease. Plain fluoroscopy demonstrates severe calcification and restricted mobility of prosthetic aortic valve leaflets.  Estimated Blood Loss: Minimal  Final Conclusions:  1. Severe native  three-vessel coronary artery disease 2. Status post multivessel CABG with continued patency of the LIMA to LAD, sequential saphenous vein graft to OM1 and OM 2, and saphenous vein graft to distal RCA 3. Severe bioprosthetic aortic valve stenosis  Recommendations: TCTS evaluation for redo AVR versus valve-in-valve TAVR.   Tonny Bollman MD, [facility] 07/03/2014, 9:47 AM           STS Risk Calculator  Procedure    Redo AVR  Risk of Mortality   5.7% Morbidity or Mortality  24.9% Prolonged LOS   10.3% Short LOS    22.3% Permanent Stroke   2.9% Prolonged Vent Support  19.4% DSW Infection    0.1% Renal Failure    6.0% Reoperation    8.3%   Impression:  The patient has severe prosthetic valve dysfunction status post aortic valve replacement using a bioprosthetic tissue valve in 2009 with stage D severe symptomatic aortic stenosis. She presents with accelerating symptoms of exertional pain in her left shoulder consistent angina pectoris, currently functional class 3-4. Interestingly, the patient states that her symptoms are very similar to the symptoms she had prior to her aortic valve replacement and coronary artery bypass grafting in 2009. The patient has severe three-vessel coronary artery disease involving the native coronary arteries, but all of her bypass grafts placed at the time of surgery in 2009 remain widely patent and free of significant disease.  Left ventricular systolic function remains reasonably well-preserved. I have personally reviewed the patient's most recent transthoracic echocardiogram and diagnostic cardiac catheterization.  The patient has severe prosthetic valve dysfunction with calcification and somewhat restricted leaflet mobility involving at least 2 of the 3 leaflets of her valve. There is at least mild to moderate aortic insufficiency as well. The patient's aortic root and bioprosthetic tissue valve are relatively small in size, which further exacerbates the  functional significance of its leaflet dysfunction and exacerbate the degree of stenosis. Risks associated with conventional surgical redo aortic valve replacement would be at least moderately elevated because of the patient's advanced age and comorbid risk factors, and I feel that risks would likely be considerably higher than that predicted using the STS risk calculator because of the likelihood that aortic root enlargement or root replacement would be required at the time of surgery. Transcatheter aortic valve replacement might be a reasonable alternative, although the relatively small size of her existing valve would mandate use of a relatively small transcatheter device and potentially leave her with some degree of residual stenosis and somewhat uncertain durability.   Plan:  The patient and her husband were counseled at length regarding treatment alternatives for management of severe symptomatic aortic stenosis. Alternative approaches such as conventional aortic valve replacement, transcatheter aortic valve replacement, and palliative medical therapy were compared and contrasted at length.  The risks associated with conventional surgical aortic valve replacement were been discussed in detail, as were expectations for post-operative convalescence. Long-term prognosis with medical therapy was discussed. This discussion was placed in the context of the patient's own specific clinical presentation and past medical history.  The implications regarding her relatively small aortic root and bioprosthetic tissue valve were discussed within the context of subsequent treatment options. All of their questions been addressed.  We plan to proceed with CT angiography to further characterize the anatomical feasibility of valve and valve transcatheter aortic valve replacement as well as options for vascular access. Her clinical case and all diagnostic tests will be reviewed by a multidisciplinary team specialists.  The  patient will return in 2-3 weeks to discuss treatment options further.     Salvatore Decent. Cornelius Moras, MD 07/18/2014 12:21 PM

## 2014-07-19 ENCOUNTER — Other Ambulatory Visit: Payer: Self-pay | Admitting: *Deleted

## 2014-07-19 DIAGNOSIS — N289 Disorder of kidney and ureter, unspecified: Secondary | ICD-10-CM

## 2014-07-20 ENCOUNTER — Ambulatory Visit (HOSPITAL_COMMUNITY)
Admission: RE | Admit: 2014-07-20 | Discharge: 2014-07-20 | Disposition: A | Discharge status: Home / Self Care | Payer: Medicare HMO | Source: Ambulatory Visit | Attending: Thoracic Surgery (Cardiothoracic Vascular Surgery) | Admitting: Thoracic Surgery (Cardiothoracic Vascular Surgery)

## 2014-07-20 DIAGNOSIS — I350 Nonrheumatic aortic (valve) stenosis: Secondary | ICD-10-CM

## 2014-07-20 LAB — BASIC METABOLIC PANEL
Anion gap: 8 (ref 5–15)
BUN: 29 mg/dL — ABNORMAL HIGH (ref 6–23)
CO2: 25 mmol/L (ref 19–32)
Calcium: 9.9 mg/dL (ref 8.4–10.5)
Chloride: 105 mEq/L (ref 96–112)
Creatinine, Ser: 1.42 mg/dL — ABNORMAL HIGH (ref 0.50–1.10)
GFR calc Af Amer: 40 mL/min — ABNORMAL LOW (ref >90)
GFR calc non Af Amer: 34 mL/min — ABNORMAL LOW (ref >90)
Glucose, Bld: 103 mg/dL — ABNORMAL HIGH (ref 70–99)
Potassium: 4.4 mmol/L (ref 3.5–5.1)
Sodium: 138 mmol/L (ref 135–145)

## 2014-07-20 MED ORDER — IOHEXOL 350 MG/ML SOLN: 80.0000 mL | Freq: Once | INTRAVENOUS | Status: AC | PRN

## 2014-07-20 MED ORDER — SODIUM BICARBONATE BOLUS VIA INFUSION
INTRAVENOUS | Status: AC
  Administered 2014-07-20: 75 meq via INTRAVENOUS
  Filled 2014-07-20: qty 1

## 2014-07-20 MED ORDER — METOPROLOL TARTRATE 1 MG/ML IV SOLN
INTRAVENOUS | Status: AC
  Administered 2014-07-20: 5 mg
  Filled 2014-07-20: qty 10

## 2014-07-20 MED ORDER — SODIUM BICARBONATE 8.4 % IV SOLN
INTRAVENOUS | Status: DC
  Filled 2014-07-20: qty 500

## 2014-07-24 ENCOUNTER — Encounter: Payer: Self-pay | Admitting: Physical Therapy

## 2014-07-24 ENCOUNTER — Ambulatory Visit: Payer: Medicare HMO | Attending: Thoracic Surgery (Cardiothoracic Vascular Surgery) | Admitting: Physical Therapy

## 2014-07-24 ENCOUNTER — Other Ambulatory Visit: Payer: Self-pay | Admitting: Thoracic Surgery (Cardiothoracic Vascular Surgery)

## 2014-07-24 DIAGNOSIS — R269 Unspecified abnormalities of gait and mobility: Secondary | ICD-10-CM | POA: Diagnosis not present

## 2014-07-24 DIAGNOSIS — M79622 Pain in left upper arm: Secondary | ICD-10-CM | POA: Insufficient documentation

## 2014-07-24 DIAGNOSIS — M25522 Pain in left elbow: Secondary | ICD-10-CM

## 2014-07-24 NOTE — Therapy (Signed)
[facility] [address] [place], [place], [address] Phone: [phone]   Fax:  [phone]  Physical Therapy Evaluation  Patient Details  Name: [name] MRN: [id number] Date of Birth: Feb 15, 1933 Referring Provider:  Purcell Nails, MD  Encounter Date: 07/24/2014      PT End of Session - 07/24/14 1340    Visit Number 1   Number of Visits 1   PT Start Time 1437   PT Stop Time 1518   PT Time Calculation (min) 41 min      Past Medical History  Diagnosis Date  . Aortic stenosis     a.  s/p tissue AVR at time of CABG in 2009;  b. Echo 04/2012: EF 55-60%, moderate AS (mean 34);  c. Echo 6/14: Mild LVH, mild focal basal septal hypertrophy, EF 55-60%, normal wall motion, grade 2 diastolic dysfunction, AVR with moderate aortic stenosis (mean 36) - consider TEE if clinically indicated, mild AI, mild MR, PASP 44, ascending aorta mild to moderately dilated (consider CTA or MRA)  . Dyslipidemia   . Hypertension   . Spinal arthritis   . CAD (coronary artery disease)     a. s/p CABG; b. Myoview 06/2011: No ischemia, EF 67%;  c. 01/2013 Cath: LM min irregs, LAD small, LCX 149m OMs ok, RCA known 100, VG->RCA ok, VG->OM1->2 ok, LIMA->LAD ok.  Marland Kitchen Hx of CABG     LIMA-LAD, SVG-RCA, SVG-OM1/OM2 in 2009  . Blindness of right eye     due to retinal bleed  . Glaucoma   . Left bundle branch block   . Carotid stenosis     Carotid U/S 5/13:  bilat 40-59%  . Shortness of breath   . Heart murmur   . Chronic kidney disease     renal insufficiency  . GERD (gastroesophageal reflux disease)   . Arthritis   . Blind right eye   . PONV (postoperative nausea and vomiting)     nausea  . HOH (hard of hearing)   . S/P aortic valve replacement with bioprosthetic valve 07/28/2007    #34mm Sorin Mitroflow pericardial tissue valve    . S/P CABG x 4 07/28/2007    LIMA to LAD, SVG to RCA, sequential SVG to OM1-OM2  . Unstable angina 01/14/2013  .  Occlusion and stenosis of carotid artery without mention of cerebral infarction 11/28/2011  . Aortic stenosis, severe 07/03/2014    Recurrent, due to prosthetic valve stenosis   . Prosthetic valve dysfunction     Past Surgical History  Procedure Laterality Date  . Coronary artery bypass graft  Jan 2009    LIMA to LAD, SVG to RCA, SVG to OM 1 & 2  . Aortic valve replacement  Jan 2009    #21 mm pericardial prosthesis  . Back surgery    . Neck surgery    . Abdominal hysterectomy    . Cholecystectomy    . Eye surgery    . Cardiac catheterization  2014  . Appendectomy    . Posterior cervical laminectomy Left 11/04/2013    Procedure: Left Cervical Four-Five Foraminotomy ;  Surgeon: Temple Pacini, MD;  Location: [facility];  Service: Neurosurgery;  Laterality: Left;  Left Cervical Four-Five Foraminotomy   . Left and right heart catheterization with coronary/graft angiogram N/A 01/13/2013    Procedure: LEFT AND RIGHT HEART CATHETERIZATION WITH Isabel Caprice;  Surgeon: [name], MD;  Location: [facility] CATH LAB;  Service: Cardiovascular;  Laterality: N/A;  .  Left heart catheterization with coronary/graft angiogram N/A 07/03/2014    Procedure: LEFT HEART CATHETERIZATION WITH Isabel Caprice;  Surgeon: Micheline Chapman, MD;  Location: [facility] C[facility] LAB;  Service: Cardiovascular;  Laterality: N/A;    There were no vitals taken for this visit.  Visit Diagnosis:  Pain in joint, upper arm, left - Plan: PT plan of care cert/re-cert  Abnormality of gait - Plan: PT plan of care cert/re-cert      Subjective Assessment - 07/24/14 1342    Symptoms L arm pain with exertional activity, mild SOB; worsening over the last month or so   Patient Stated Goals improve mobility    Currently in Pain? Yes   Pain Score 3    Pain Location Arm   Pain Orientation Left   Pain Descriptors / Indicators Aching;Dull   Pain Type Chronic pain   Aggravating Factors  activity   Pain Relieving Factors rest      PT to monitor pain but no pain goal to follow secondary to outside scope of this referral.       [facility] PT Assessment - 07/24/14 0001    Assessment   Medical Diagnosis aortic stenosis   Onset Date --  years   Precautions   Precautions None   Restrictions   Weight Bearing Restrictions No   Balance Screen   Has the patient fallen in the past 6 months No   Has the patient had a decrease in activity level because of a fear of falling?  No   Is the patient reluctant to leave their home because of a fear of falling?  No   Home Environment   Living Enviornment Private residence   Living Arrangements Spouse/significant other   Type of Home House   Home Access Stairs to enter   Entrance Stairs-Number of Steps 5   Entrance Stairs-Rails Left   Prior Function   Level of Independence Independent with gait   Posture/Postural Control   Posture/Postural Control Postural limitations   Postural Limitations Forward head;Rounded Shoulders   AROM   Overall AROM  Within functional limits for tasks performed   Strength   Overall Strength Within functional limits for tasks performed   Overall Strength Comments grossly 4/5 throughout   Grip (lbs) R 40   R hand dominant   Grip (lbs) L 44          [facility] Pre-Surgical Assessment - 07/24/14 0001    5 Meter Walk Test- trial 1 4.5 sec   5 Meter Walk Test- trial 2 4 sec.    5 Meter Walk Test- trial 3 4.3 sec.  </=6 sec WNL   5 meter walk test average 4.27 sec   Timed Up & Go Test trial  8.7 sec.   Comments <12 seconds WNL   4 Stage Balance Test tolerated for:  10 sec.   4 Stage Balance Test Position 2   comment inability to stand in position 3 x 10 seconds indicates incr. fall risk   Sit To Stand Test- trial 1 12.6 sec.  </= 12.6 WNL   Comment Reports incr L arm pain back up to 3/10, had resolved to zero with seated rest while taking history   ADL/IADL Independent with: Bathing;Dressing;Meal prep;Finances   ADL/IADL Needs Assistance with:  Pincus Badder work  able to do with extra time and rest   ADL/IADL Fraility Index Vulnerable   6 Minute Walk- Baseline yes   BP (mmHg) 182/86 mmHg   HR (bpm) 77   02  Sat (%RA) 98 %   Modified Borg Scale for Dyspnea 0- Nothing at all   Perceived Rate of Exertion (Borg) 6-   6 Minute Walk Post Test yes   BP (mmHg) 200/90 mmHg   HR (bpm) 113   02 Sat (%RA) 90 %   Modified Borg Scale for Dyspnea 4- somewhat severe   Perceived Rate of Exertion (Borg) 13- Somewhat hard   Aerobic Endurance Distance Walked 558  feet   Endurance additional comments Pt required seated rest at 558' around 3:00 O2 to 90% and HR 113 at which point she requested a break due to 5/10 L arm pain. Pt also demonstrated significant shortness of breath. Pt was unable to resume walking. O2 return to baseline within 1:00.                                    Plan - 07/24/14 1421    Clinical Impression Statement Pt is a 81 yo female presenting to OP PT with cc: L arm pain with activity. Pt reports medical work up to r/o arthritis and other orthopedic causes and that pain is being caused by cardiac factors. Pt is able to perform household activities and yardwork with rest breaks. During evaluation, pt experienced symptoms with functional strength testing (5 times sit to stand) as well as during 6 minute walk test. Symptoms began with around 1 minute of walking and were reported to be a 3/10. At 3:00, they were 5/10 and pt requested rest break from which she was unable to resume. Primary symptom was L arm pain. Significant SOB was also noted. Pt was at increased fall risk per 4 stage balance test. Other balance testing was WNL.    PT Frequency One time visit          G-Codes - 07/24/14 1427    Functional Assessment Tool Used 6 minute walk test 585', only able to tolerate 3 minutes   Functional Limitation Mobility: Walking and moving around   Mobility: Walking and Moving Around Current Status [phone]([id number]) At least  20 percent but less than 40 percent impaired, limited or restricted   Mobility: Walking and Moving Around Goal Status [phone]([id number]) At least 20 percent but less than 40 percent impaired, limited or restricted   Mobility: Walking and Moving Around Discharge Status [phone]([id number]) At least 20 percent but less than 40 percent impaired, limited or restricted       Problem List Patient Active Problem List   Diagnosis Date Noted  . Prosthetic valve dysfunction   . Aortic stenosis, severe 07/03/2014  . Cervical spondylosis without myelopathy 11/04/2013  . Cervical spondylitis with radiculitis 11/04/2013  . Unstable angina 01/14/2013  . Occlusion and stenosis of carotid artery without mention of cerebral infarction 11/28/2011  . CAD (coronary artery disease) 11/29/2010  . SOB (shortness of breath) on exertion   . Dyslipidemia   . Hypertension   . Blindness of right eye   . Spinal arthritis   . S/P aortic valve replacement with bioprosthetic valve 07/28/2007  . S/P CABG x 4 07/28/2007    Percell Bostonana Nicoletta, PT, DPT 07/24/2014 2:31 PM Phone: [phone] Fax: [phone]  [facility] [address] [place], [place], [address] Phone: [phone]   Fax:  [phone]

## 2014-07-25 ENCOUNTER — Ambulatory Visit: Payer: Medicare HMO | Admitting: Physical Therapy

## 2014-07-25 ENCOUNTER — Other Ambulatory Visit: Payer: Self-pay | Admitting: *Deleted

## 2014-07-25 DIAGNOSIS — N289 Disorder of kidney and ureter, unspecified: Secondary | ICD-10-CM

## 2014-07-25 LAB — BASIC METABOLIC PANEL WITH GFR
BUN: 28 mg/dL — ABNORMAL HIGH (ref 6–23)
CHLORIDE: 100 meq/L (ref 96–112)
CO2: 24 mEq/L (ref 19–32)
CREATININE: 1.55 mg/dL — AB (ref 0.50–1.10)
Calcium: 10 mg/dL (ref 8.4–10.5)
GFR, Est African American: 36 mL/min — ABNORMAL LOW
GFR, Est Non African American: 32 mL/min — ABNORMAL LOW
GLUCOSE: 96 mg/dL (ref 70–99)
Potassium: 4.3 mEq/L (ref 3.5–5.3)
SODIUM: 132 meq/L — AB (ref 135–145)

## 2014-07-27 ENCOUNTER — Ambulatory Visit (HOSPITAL_COMMUNITY)
Admission: RE | Admit: 2014-07-27 | Discharge: 2014-07-27 | Disposition: A | Discharge status: Home / Self Care | Payer: Medicare HMO | Source: Ambulatory Visit | Attending: Thoracic Surgery (Cardiothoracic Vascular Surgery) | Admitting: Thoracic Surgery (Cardiothoracic Vascular Surgery)

## 2014-07-27 ENCOUNTER — Encounter (HOSPITAL_COMMUNITY): Payer: Medicare HMO

## 2014-07-27 ENCOUNTER — Encounter (HOSPITAL_COMMUNITY): Payer: Self-pay

## 2014-07-27 VITALS — BP 136/50 | HR 64 | Resp 20

## 2014-07-27 DIAGNOSIS — I350 Nonrheumatic aortic (valve) stenosis: Secondary | ICD-10-CM | POA: Insufficient documentation

## 2014-07-27 DIAGNOSIS — N289 Disorder of kidney and ureter, unspecified: Secondary | ICD-10-CM

## 2014-07-27 LAB — PULMONARY FUNCTION TEST
DL/VA % PRED: 91 %
DL/VA: 4.03 ml/min/mmHg/L
DLCO COR: 11.56 ml/min/mmHg
DLCO UNC % PRED: 57 %
DLCO cor % pred: 57 %
DLCO unc: 11.56 ml/min/mmHg
FEF 25-75 Post: 1.04 L/sec
FEF 25-75 Pre: 0.97 L/sec
FEF2575-%Change-Post: 6 %
FEF2575-%Pred-Post: 82 %
FEF2575-%Pred-Pre: 77 %
FEV1-%Change-Post: 0 %
FEV1-%Pred-Post: 77 %
FEV1-%Pred-Pre: 77 %
FEV1-Post: 1.3 L
FEV1-Pre: 1.3 L
FEV1FVC-%Change-Post: -2 %
FEV1FVC-%Pred-Pre: 100 %
FEV6-%CHANGE-POST: 1 %
FEV6-%PRED-PRE: 81 %
FEV6-%Pred-Post: 83 %
FEV6-Post: 1.78 L
FEV6-Pre: 1.75 L
FEV6FVC-%Change-Post: 0 %
FEV6FVC-%PRED-PRE: 106 %
FEV6FVC-%Pred-Post: 105 %
FVC-%CHANGE-POST: 2 %
FVC-%PRED-POST: 79 %
FVC-%PRED-PRE: 77 %
FVC-Post: 1.8 L
FVC-Pre: 1.75 L
PRE FEV1/FVC RATIO: 74 %
Post FEV1/FVC ratio: 73 %
Post FEV6/FVC ratio: 99 %
Pre FEV6/FVC Ratio: 100 %
RV % PRED: 131 %
RV: 2.93 L
TLC % pred: 99 %
TLC: 4.57 L

## 2014-07-27 LAB — BASIC METABOLIC PANEL
Anion gap: 10 (ref 5–15)
BUN: 23 mg/dL (ref 6–23)
CO2: 22 mmol/L (ref 19–32)
Calcium: 9.9 mg/dL (ref 8.4–10.5)
Chloride: 99 mEq/L (ref 96–112)
Creatinine, Ser: 1.74 mg/dL — ABNORMAL HIGH (ref 0.50–1.10)
GFR calc Af Amer: 31 mL/min — ABNORMAL LOW (ref >90)
GFR calc non Af Amer: 27 mL/min — ABNORMAL LOW (ref >90)
Glucose, Bld: 115 mg/dL — ABNORMAL HIGH (ref 70–99)
POTASSIUM: 4.3 mmol/L (ref 3.5–5.1)
Sodium: 131 mmol/L — ABNORMAL LOW (ref 135–145)

## 2014-07-27 MED ORDER — SODIUM BICARBONATE 8.4 % IV SOLN
INTRAVENOUS | Status: AC
  Administered 2014-07-27: 12:00:00 via INTRAVENOUS
  Filled 2014-07-27: qty 500

## 2014-07-27 MED ORDER — ALBUTEROL SULFATE (2.5 MG/3ML) 0.083% IN NEBU
2.5000 mg | INHALATION_SOLUTION | Freq: Once | RESPIRATORY_TRACT | Status: AC
  Administered 2014-07-27: 2.5 mg via RESPIRATORY_TRACT

## 2014-07-27 MED ORDER — SODIUM BICARBONATE BOLUS VIA INFUSION
INTRAVENOUS | Status: AC
  Administered 2014-07-27: 11:00:00 via INTRAVENOUS
  Filled 2014-07-27: qty 205

## 2014-07-27 MED ORDER — IOHEXOL 350 MG/ML SOLN
80.0000 mL | Freq: Once | INTRAVENOUS | Status: AC | PRN
  Administered 2014-07-27: 80 mL via INTRAVENOUS

## 2014-07-27 NOTE — Progress Notes (Signed)
Pt arrived to nursing station about ago, husband called to side. Bicarb restarted for infuse for 2 hrs. Pt given drink and snack.  Awaiting transport to go to short stay, room 10.  Discussed w/ Short stay nurse prior.

## 2014-07-27 NOTE — Progress Notes (Signed)
Pt arrived from CT imaging area via wc to continue her IV bicarb.  Iv site dry/intact. Denies pain. Vitals stable 170/55 p 70

## 2014-07-27 NOTE — Progress Notes (Signed)
Infusion complete, iv removed, site dry no bruising or redness. Pt left ambulating without incidence.

## 2014-08-04 ENCOUNTER — Ambulatory Visit (INDEPENDENT_AMBULATORY_CARE_PROVIDER_SITE_OTHER): Payer: Medicare HMO | Admitting: Thoracic Surgery (Cardiothoracic Vascular Surgery)

## 2014-08-04 ENCOUNTER — Encounter: Payer: Self-pay | Admitting: Thoracic Surgery (Cardiothoracic Vascular Surgery)

## 2014-08-04 VITALS — BP 138/70 | HR 77 | Resp 20 | Ht 61.0 in | Wt 150.0 lb

## 2014-08-04 DIAGNOSIS — Z951 Presence of aortocoronary bypass graft: Secondary | ICD-10-CM

## 2014-08-04 DIAGNOSIS — I350 Nonrheumatic aortic (valve) stenosis: Secondary | ICD-10-CM

## 2014-08-04 DIAGNOSIS — Z954 Presence of other heart-valve replacement: Secondary | ICD-10-CM

## 2014-08-04 DIAGNOSIS — Z953 Presence of xenogenic heart valve: Secondary | ICD-10-CM

## 2014-08-04 NOTE — Progress Notes (Signed)
[facility]   MULTIDISCIPLINARY HEART VALVE CLINIC       CARDIOTHORACIC SURGERY NOTE  Primary Cardiologist is Hochrein, James, MD  Referring Provider is Hendrickson, Steven C, MD PCP is ESCAJEDA, RICHARD, MD   HPI:  Patient returns to the office today for recurrent stage D severe symptomatic aortic stenosis related to prosthetic valve dysfunction, originally status post aortic valve replacement using a bioprosthetic tissue valve in 2009. She was originally seen in consultation by Dr. Hendrickson on 07/12/2014 after which time she was referred for a second surgical opinion to discuss valve-in-valve transcatheter aortic valve replacement as an alternative to high-risk conventional surgery.  I had the opportunity to see her on 07/18/2014, And since then the patient has undergone CT angiography to further characterize the anatomical feasibility of a transcatheter approach to her treatment. She returns to the office today to review the results of these tests and discussed treatment options further. She reports no new problems or complaints over the last 2 weeks. She continues to experience exertional pain in her chest and left shoulder consistent with angina pectoris. She does not have much shortness of breath either with activity or at rest. She has been careful not to exert herself to much, And she states that she usually only gets short of breath when she exerts herself more strenuously and she has more severe pain in her shoulder. She has some dizziness, but she has not had any severe dizzy spells, nor syncope over the past 2 weeks. The remainder of her review of systems is unchanged.    Current Outpatient Prescriptions  Medication Sig Dispense Refill  . aspirin 81 MG tablet Take 81 mg by mouth daily.      . atorvastatin (LIPITOR) 80 MG tablet Take 80 mg by mouth daily.    . benazepril (LOTENSIN) 20 MG tablet Take 20 mg by mouth daily.    . Calcium Carbonate (CALTRATE 600 PO)  Take 1 tablet by mouth daily.     . hydrochlorothiazide (HYDRODIURIL) 25 MG tablet TAKE 1 TABLET BY MOUTH EVERY DAY 90 tablet 3  . ibuprofen (ADVIL,MOTRIN) 200 MG tablet Take 200 mg by mouth every 6 (six) hours as needed for pain or headache (headache).    . NITROSTAT 0.3 MG SL tablet Place 0.3 mg under the tongue every 5 (five) minutes as needed for chest pain.     . timolol (TIMOPTIC) 0.5 % ophthalmic solution Place 1 drop into both eyes 2 (two) times daily.    . Vitamin D, Ergocalciferol, (DRISDOL) 50000 UNITS CAPS Take 50,000 Units by mouth every 7 (seven) days. On Tuesday    . zolpidem (AMBIEN) 5 MG tablet Take 5 mg by mouth at bedtime as needed for sleep (sleep).     No current facility-administered medications for this visit.      Physical Exam:   BP 138/70 mmHg  Pulse 77  Resp 20  Ht 5' 1" (1.549 m)  Wt 150 lb (68.04 kg)  BMI 28.36 kg/m2  SpO2 97%  General:  Well-appearing  Chest:   Clear  CV:   Regular rate and rhythm with harsh systolic murmur  Incisions:  n/a  Abdomen:  Soft and nontender  Extremities:  Warm and well perfused, no lower extremity edema  Diagnostic Tests:  Cardiac TAVR CT  TECHNIQUE: The patient was scanned on a Philips 256 scanner. A 120 kV retrospective scan was triggered in the descending thoracic aorta at 111 HU's. Gantry rotation speed was 270 msecs and   collimation was .9 mm. 5 mg of iv metoprolol and no nitro were given. The 3D data set was reconstructed in 5% intervals of the R-R cycle. Systolic and diastolic phases were analyzed on a dedicated work station using MPR, MIP and VRT modes. The patient received 80 cc of contrast.  FINDINGS: Aortic Valve: A 21 mm Mitroflow pericardial bioprosthetic valve is in the aortic position. Aortic valve leaflets are moderately thickened and mildly to moderately restricted leaflet opening.  Aorta: There is aneurysmal dilatation of the ascending thoracic aorta with maximum diameter 49 x 46 mm. Aortic  arch and descending thoracic aorta are normal caliber. No dissection. Moderate diffuse calcifications.  Sinotubular Junction: 25 x 26 mm  Ascending Thoracic Aorta: 49 x 46 mm  Aortic Arch: 25 x 24 mm  Descending Thoracic Aorta: 25 x 24 mm  Coronary Artery Height above Annulus:  Left Main: 6 mm  Right Coronary: 11 mm  Virtual Basal Annulus Measurements:  Maximum/Minimum Diameter: 21 x 21 mm  Area: 370 mm2  Aortic valve area: 0.9 cm2  Optimum Fluoroscopic Angle for Delivery: CAO 13, CAU 10  IMPRESSION: 1. A 21 mm Mitroflow pericardial bioprosthetic valve is in the aortic position. Aortic valve leaflets are moderately thickened and mildly to moderately restricted leaflet opening. Annulus is small measuring 21 x 21 mm with area 370 mm2 suitable for 20 mm S3 valve.  2. There is aneurysmal dilatation of the ascending thoracic aorta with maximum diameter 49 x 46 mm. Normal caliber of the remaining portions of the thoracic aorta.  3. Optimum Fluoroscopic Angle for Delivery: CAO 13, CAU 10  Katarina Nelson  Electronically Signed: By: Katarina Nelson On: 07/20/2014 18:07  OVER-READ INTERPRETATION CT CHEST  The following report is an over-read performed by radiologist Dr. Entrikinof [facility] on 07/21/2014. This over-read does not include interpretation of cardiac or coronary anatomy or pathology. The CTA interpretation by the cardiologist is attached.  FINDINGS: There are scattered areas of bronchial wall thickening with mild cylindrical bronchiectasis and thickening of the peribronchovascular interstitium, with associated architectural distortion and scarring, most evident in the inferior segment of the lingula and in the right middle lobe. Other patchy areas of peribronchovascular micronodularity with a tree-in-bud appearance are noted in the lungs bilaterally, most evident in the periphery of the left upper  lobe, most compatible with areas of mucoid impaction within terminal bronchioles. No other larger more suspicious appearing pulmonary nodules or masses are noted in the visualized portions of the thorax. Mildly enlarged 11 mm short axis low right paratracheal lymph node is noted, however, this has a normal appearing fatty hilum. Small hiatal hernia. Visualized portions of the upper abdomen are unremarkable. Median sternotomy wires. Aneurysmal dilatation of the ascending thoracic aorta, as described in the attached report. There are no aggressive appearing lytic or blastic lesions noted in the visualized portions of the skeleton.  IMPRESSION: 1. 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 Clinical correlation is recommended, with followup nonemergent high-resolution chest CT suggested in the near future to better evaluate these findings and establish a baseline for future followup examinations. 2. Small hiatal hernia.   Electronically Signed  By: Daniel Entrikin M.D.  On: 07/21/2014 10:24    CTA ABDOMEN AND PELVIS WITH CONTRAST  TECHNIQUE: Multidetector CT imaging of the abdomen and pelvis was performed using the standard protocol during bolus administration of intravenous contrast. Multiplanar reconstructed images and MIPs were   obtained and reviewed to evaluate the vascular anatomy.  CONTRAST: 80mL OMNIPAQUE IOHEXOL 350 MG/ML SOLN  COMPARISON: None.  FINDINGS: CT ABDOMEN AND PELVIS FINDINGS  Lower chest: Small hiatal hernia. Otherwise, unremarkable.  Hepatobiliary: Common bile duct is severely dilated measuring up to 21 mm in the porta hepatis. No definite ductal stone identified. No significant intrahepatic biliary ductal dilatation noted. Status post cholecystectomy. Sub cm low-attenuation lesion in segment 2 of the liver is too small  to definitively characterize, but is statistically likely a tiny cyst. The liver has a slightly shrunken appearance and nodular contour, suggestive of underlying cirrhosis. No hypervascular hepatic lesion noted.  Pancreas: Unremarkable.  Spleen: Unremarkable.  Adrenals/Urinary Tract: Bilateral adrenal glands and bilateral kidneys are normal in appearance. No hydroureteronephrosis. Urinary bladder is normal in appearance.  Stomach/Bowel: Normal appearance of the stomach. No pathologic dilatation of small bowel or colon. Numerous colonic diverticuli are noted, without surrounding inflammatory changes to suggest an acute diverticulitis at this time.  Vascular/Lymphatic: Extensive atherosclerosis throughout the abdominal and pelvic vasculature, with vascular findings and measurements pertinent to potential TAVR procedure, as detailed below. No lymphadenopathy noted in the abdomen or pelvis.  Reproductive: Status post hysterectomy. Ovaries are not confidently identified may be surgically absent or atrophic.  Other: No significant volume of ascites. No pneumoperitoneum. Small right inguinal hernia containing only fat incidentally noted.  Musculoskeletal: There are no aggressive appearing lytic or blastic lesions noted in the visualized portions of the skeleton.  VASCULAR MEASUREMENTS PERTINENT TO TAVR:  AORTA:  Minimal Aortic Diameter - 9 x 7 mm  Severity of Aortic Calcification - Severe  RIGHT PELVIS:  Right Common Iliac Artery -  Minimal Diameter - 6.4 x 8.2 mm  Tortuosity - Mild  Calcification - Moderate to severe  Right External Iliac Artery -  Minimal Diameter - 5.8 x 5.4 mm  Tortuosity - Mild  Calcification - Mild  Right Common Femoral Artery -  Minimal Diameter - 5.3 x 4.7 mm  Tortuosity - Mild  Calcification - Mild  LEFT PELVIS:  Left Common Iliac Artery -  Minimal Diameter - 6.6 x 7.1 mm  Tortuosity -  Mild  Calcification - Moderate to severe  Left External Iliac Artery -  Minimal Diameter - 5.8 x 4.3 mm  Tortuosity - Mild  Calcification - Mild  Left Common Femoral Artery -  Minimal Diameter - 6.1 x 4.6 mm  Tortuosity - Mild  Calcification - Mild  Review of the MIP images confirms the above findings.  IMPRESSION: 1. Vascular findings and measurements pertinent to potential TAVR procedure, as detailed above. This patient does not appear to have suitable pelvic arterial access. 2. Status post cholecystectomy. There is severe dilatation of the common bile duct measuring up to 21 mm in the porta hepatis. No definite ductal stone noted. No intrahepatic biliary ductal dilatation. This finding is of uncertain etiology and significance, but is favored to reflect post cholecystectomy physiology and advanced age. Clinical correlation for signs and symptoms of biliary tract obstruction is recommended, however. 3. Colonic diverticulosis without findings to suggest acute diverticulitis at this time. 4. Additional incidental findings, as above.   Electronically Signed  By: Daniel Entrikin M.D.  On: 07/27/2014 13:56   Impression:  The patient has stage D severe symptomatic aortic stenosis with severe prosthetic valve dysfunction, status post aortic valve replacement using a bioprosthetic tissue valve in 2009. She presents with accelerating symptoms of exertional pain in her left shoulder consistent angina pectoris, currently functional class 3-4. Interestingly,   the patient states that her symptoms are very similar to the symptoms she had prior to her aortic valve replacement and coronary artery bypass grafting in 2009. The patient has severe three-vessel coronary artery disease involving the native coronary arteries, but all of her bypass grafts placed at the time of surgery in 2009 remain widely patent and free of significant disease. Left ventricular systolic  function remains reasonably well-preserved.  I have again personally reviewed the patient's most recent transthoracic echocardiogram which was performed more than 6 months ago. I have also reviewed the patient's more recent diagnostic cardiac catheterization and CT angiograms, all of which were reviewed with a multidisciplinary team of specialists.  The patient has severe prosthetic valve dysfunction with calcification and somewhat restricted leaflet mobility involving at least 2 of the 3 leaflets of her valve. There is at least mild to moderate aortic insufficiency as well. The patient's aortic root and bioprosthetic tissue valve are relatively small in size, which further exacerbates the functional significance of its leaflet dysfunction and exacerbate the degree of stenosis. CT angiography demonstrates moderate aneurysmal enlargement of the ascending thoracic aorta with maximum transverse diameter 4.9 cm.  Risks associated with conventional surgical redo aortic valve replacement would be at least moderately elevated because of the patient's advanced age and comorbid risk factors, and I feel that risks would likely be considerably higher than that predicted using the STS risk calculator because of the likelihood that aortic root replacement would be required at the time of surgery. Transcatheter aortic valve replacement might be a reasonable alternative, although the relatively small size of her existing valve would mandate use of a relatively small transcatheter device (20 mm Sapien 3 transcatheter heart valve).  This would clearly leave the patient with significant residual aortic stenosis and somewhat uncertain long term outcome.  CT angiography of the abdomen and pelvis demonstrate relatively small vessel diameters with severe calcification involving the infrarenal aorta, although the iliac vessels are relatively free of significant calcification or tortuosity.  A transfemoral approach remains an option for  transcatheter aortic valve replacement, although pelvic arterial access is clearly borderline at best.   Plan:  After review of the patient's films with a multidisciplinary team of specialists, we have recommended that the patient undergo transesophageal echocardiogram to further characterize the relative degree of re-stenosis in an effort to better characterize expectations for hemodynamic results following TAVR.  I have again reviewed options at length with the patient and her husband in the office this afternoon.  Rationale for proceeding with TEE was discussed. All of their questions have been addressed. The patient will return for follow-up in 2 weeks to review the results of her TEE and discuss treatment options further.  All of their questions been addressed.   I spent in excess of 30 minutes during the conduct of this office consultation and >50% of this time involved direct face-to-face encounter with the patient for counseling and/or coordination of their care.   Clarence H. Owen, MD 08/04/2014 3:05 PM     

## 2014-08-05 LAB — BASIC METABOLIC PANEL
BUN: 30 mg/dL — ABNORMAL HIGH (ref 6–23)
CHLORIDE: 102 meq/L (ref 96–112)
CO2: 24 mEq/L (ref 19–32)
CREATININE: 1.53 mg/dL — AB (ref 0.50–1.10)
Calcium: 9.6 mg/dL (ref 8.4–10.5)
GLUCOSE: 98 mg/dL (ref 70–99)
POTASSIUM: 4.7 meq/L (ref 3.5–5.3)
SODIUM: 135 meq/L (ref 135–145)

## 2014-08-05 LAB — ESTIMATED GFR
GFR, Est African American: 37 mL/min — ABNORMAL LOW
GFR, Est Non African American: 32 mL/min — ABNORMAL LOW

## 2014-08-07 ENCOUNTER — Telehealth: Payer: Self-pay | Admitting: Cardiology

## 2014-08-07 NOTE — Telephone Encounter (Signed)
Talked with Pt concerning scheduling a TEE for preop eval per Dr Orvan Julywen's request. Pt is scheduled for Thursday 08/10/14 @ 10am. Pt was instructed to be NPO after midnight & to arrive at 9am.

## 2014-08-10 ENCOUNTER — Encounter (HOSPITAL_COMMUNITY)
Admission: RE | Disposition: A | Discharge status: Home / Self Care | Payer: Self-pay | Source: Ambulatory Visit | Attending: Cardiology

## 2014-08-10 ENCOUNTER — Encounter (HOSPITAL_COMMUNITY): Payer: Self-pay | Admitting: *Deleted

## 2014-08-10 ENCOUNTER — Ambulatory Visit (HOSPITAL_COMMUNITY)
Admission: RE | Admit: 2014-08-10 | Discharge: 2014-08-10 | Disposition: A | Discharge status: Home / Self Care | Payer: Medicare HMO | Source: Ambulatory Visit | Attending: Cardiology | Admitting: Cardiology

## 2014-08-10 DIAGNOSIS — Z9049 Acquired absence of other specified parts of digestive tract: Secondary | ICD-10-CM | POA: Diagnosis not present

## 2014-08-10 DIAGNOSIS — Z951 Presence of aortocoronary bypass graft: Secondary | ICD-10-CM | POA: Insufficient documentation

## 2014-08-10 DIAGNOSIS — I350 Nonrheumatic aortic (valve) stenosis: Secondary | ICD-10-CM

## 2014-08-10 DIAGNOSIS — Z952 Presence of prosthetic heart valve: Secondary | ICD-10-CM | POA: Insufficient documentation

## 2014-08-10 DIAGNOSIS — Z7982 Long term (current) use of aspirin: Secondary | ICD-10-CM | POA: Insufficient documentation

## 2014-08-10 HISTORY — PX: TEE WITHOUT CARDIOVERSION: SHX5443

## 2014-08-10 SURGERY — ECHOCARDIOGRAM, TRANSESOPHAGEAL
Anesthesia: Moderate Sedation

## 2014-08-10 MED ORDER — HYDRALAZINE HCL 20 MG/ML IJ SOLN
INTRAMUSCULAR | Status: DC | PRN
  Administered 2014-08-10 (×2): 10 mg via INTRAVENOUS

## 2014-08-10 MED ORDER — MIDAZOLAM HCL 10 MG/2ML IJ SOLN
INTRAMUSCULAR | Status: DC | PRN
  Administered 2014-08-10: 2 mg via INTRAVENOUS
  Administered 2014-08-10 (×3): 1 mg via INTRAVENOUS

## 2014-08-10 MED ORDER — FENTANYL CITRATE 0.05 MG/ML IJ SOLN
INTRAMUSCULAR | Status: DC | PRN
  Administered 2014-08-10: 25 ug via INTRAVENOUS
  Administered 2014-08-10 (×2): 12.5 ug via INTRAVENOUS
  Administered 2014-08-10: 25 ug via INTRAVENOUS

## 2014-08-10 MED ORDER — HYDRALAZINE HCL 20 MG/ML IJ SOLN
INTRAMUSCULAR | Status: AC
  Filled 2014-08-10: qty 1

## 2014-08-10 MED ORDER — METOPROLOL TARTRATE 1 MG/ML IV SOLN
INTRAVENOUS | Status: AC
  Filled 2014-08-10: qty 5

## 2014-08-10 MED ORDER — MIDAZOLAM HCL 5 MG/ML IJ SOLN
INTRAMUSCULAR | Status: AC
  Filled 2014-08-10: qty 2

## 2014-08-10 MED ORDER — SODIUM CHLORIDE 0.9 % IV SOLN
INTRAVENOUS | Status: DC
  Administered 2014-08-10: 500 mL via INTRAVENOUS

## 2014-08-10 MED ORDER — BUTAMBEN-TETRACAINE-BENZOCAINE 2-2-14 % EX AERO
INHALATION_SPRAY | CUTANEOUS | Status: DC | PRN
  Administered 2014-08-10: 2 via TOPICAL

## 2014-08-10 MED ORDER — METOPROLOL TARTRATE 1 MG/ML IV SOLN
INTRAVENOUS | Status: DC | PRN
  Administered 2014-08-10: 5 mg via INTRAVENOUS

## 2014-08-10 MED ORDER — FENTANYL CITRATE 0.05 MG/ML IJ SOLN
INTRAMUSCULAR | Status: AC
  Filled 2014-08-10: qty 2

## 2014-08-10 NOTE — CV Procedure (Signed)
     Transesophageal Echocardiogram Note  [place] E Chamberland [id number] 12/23/1932  Procedure: Transesophageal Echocardiogram Indications: aortic re-stenosis, s/o Mitroflow AVR in 2009  Procedure Details Consent: Obtained Time Out: Verified patient identification, verified procedure, site/side was marked, verified correct patient position, special equipment/implants available, Radiology Safety Procedures followed,  medications/allergies/relevent history reviewed, required imaging and test results available.  Performed  Medications: Fentanyl: 75 mcg Versed: 5 mg S/P Mitroflow Sorin aortic valve replacement. The leaflets are severely thickened and calcified with almost fixed non-coronary and left coronary cusps and only limited motion of the right coronary cusp. There is severe aortic stenosis (gradients - peak 79 and mean 35 mmHg are off axis and therefore underestimated). There is small annulus measuring 18 mm.   There is moderate aortic insufficiency.   Ascending aorta is moderately dilated measuring 48 mm.   Complications: No apparent complications Patient did tolerate procedure well.  Lars MassonNELSON, Attallah Ontko H, MD, [facility] 08/10/2014, 9:41 AM

## 2014-08-10 NOTE — Progress Notes (Signed)
Echocardiogram Echocardiogram Transesophageal has been performed.  Merina Behrendt 08/10/2014, 12:25 PM

## 2014-08-10 NOTE — Discharge Instructions (Signed)
Transesophageal Echocardiogram °Transesophageal echocardiography (TEE) is a picture test of your heart using sound waves. The pictures taken can give very detailed pictures of your heart. This can help your doctor see if there are problems with your heart. TEE can check: °· If your heart has blood clots in it. °· How well your heart valves are working. °· If you have an infection on the inside of your heart. °· Some of the major arteries of your heart. °· If your heart valve is working after a repair. °· Your heart before a procedure that uses a shock to your heart to get the rhythm back to normal. °BEFORE THE PROCEDURE °· Do not eat or drink for 6 hours before the procedure or as told by your doctor. °· Make plans to have someone drive you home after the procedure. Do not drive yourself home. °· An IV tube will be put in your arm. °PROCEDURE °· You will be given a medicine to help you relax (sedative). It will be given through the IV tube. °· A numbing medicine will be sprayed or gargled in the back of your throat to help numb it. °· The tip of the probe is placed into the back of your mouth. You will be asked to swallow. This helps to pass the probe into your esophagus. °· Once the tip of the probe is in the right place, your doctor can take pictures of your heart. °· You may feel pressure at the back of your throat. °AFTER THE PROCEDURE °· You will be taken to a recovery area so the sedative can wear off. °· Your throat may be sore and scratchy. This will go away slowly over time. °· You will go home when you are fully awake and able to swallow liquids. °· You should have someone stay with you for the next 24 hours. °· Do not drive or operate machinery for the next 24 hours. °Document Released: 04/20/2009 Document Revised: 06/28/2013 Document Reviewed: 12/23/2012 °[employer]® Patient Information ©2015 [employer]. This information is not intended to replace advice given to you by your health care provider. Make  sure you discuss any questions you have with your health care provider. ° ° °Conscious Sedation, Adult, Care After °Refer to this sheet in the next few weeks. These instructions provide you with information on caring for yourself after your procedure. Your health care provider may also give you more specific instructions. Your treatment has been planned according to current medical practices, but problems sometimes occur. Call your health care provider if you have any problems or questions after your procedure. °WHAT TO EXPECT AFTER THE PROCEDURE  °After your procedure: °· You may feel sleepy, clumsy, and have poor balance for several hours. °· Vomiting may occur if you eat too soon after the procedure. °HOME CARE INSTRUCTIONS °· Do not participate in any activities where you could become injured for at least 24 hours. Do not: °¨ Drive. °¨ Swim. °¨ Ride a bicycle. °¨ Operate heavy machinery. °¨ Cook. °¨ Use power tools. °¨ Climb ladders. °¨ Work from a high place. °· Do not make important decisions or sign legal documents until you are improved. °· If you vomit, drink water, juice, or soup when you can drink without vomiting. Make sure you have little or no nausea before eating solid foods. °· Only take over-the-counter or prescription medicines for pain, discomfort, or fever as directed by your health care provider. °· Make sure you and your family fully understand everything about   the medicines given to you, including what side effects may occur. °· You should not drink alcohol, take sleeping pills, or take medicines that cause drowsiness for at least 24 hours. °· If you smoke, do not smoke without supervision. °· If you are feeling better, you may resume normal activities 24 hours after you were sedated. °· Keep all appointments with your health care provider. °SEEK MEDICAL CARE IF: °· Your skin is pale or bluish in color. °· You continue to feel nauseous or vomit. °· Your pain is getting worse and is not helped  by medicine. °· You have bleeding or swelling. °· You are still sleepy or feeling clumsy after 24 hours. °SEEK IMMEDIATE MEDICAL CARE IF: °· You develop a rash. °· You have difficulty breathing. °· You develop any type of allergic problem. °· You have a fever. °MAKE SURE YOU: °· Understand these instructions. °· Will watch your condition. °· Will get help right away if you are not doing well or get worse. °Document Released: 04/13/2013 Document Reviewed: 04/13/2013 °[employer]® Patient Information ©2015 [employer]. This information is not intended to replace advice given to you by your health care provider. Make sure you discuss any questions you have with your health care provider. ° °

## 2014-08-10 NOTE — H&P (View-Only) (Signed)
[facility]   MULTIDISCIPLIN[facility]ART VALVE CLINIC       CARDIOTHORACIC SURGERY NOTE  Primary Cardiologist is [name], MD  Referring Provider is Loreli Slot, MD PCP is Tarri Fuller, MD   HPI:  Patient returns to the office today for recurrent stage D severe symptomatic aortic stenosis related to prosthetic valve dysfunction, originally status post aortic valve replacement using a bioprosthetic tissue valve in 2009. She was originally seen in consultation by Dr. Dorris Fetch on 07/12/2014 after which time she was referred for a second surgical opinion to discuss valve-in-valve transcatheter aortic valve replacement as an alternative to high-risk conventional surgery.  I had the opportunity to see her on 07/18/2014, And since then the patient has undergone CT angiography to further characterize the anatomical feasibility of a transcatheter approach to her treatment. She returns to the office today to review the results of these tests and discussed treatment options further. She reports no new problems or complaints over the last 2 weeks. She continues to experience exertional pain in her chest and left shoulder consistent with angina pectoris. She does not have much shortness of breath either with activity or at rest. She has been careful not to exert herself to much, And she states that she usually only gets short of breath when she exerts herself more strenuously and she has more severe pain in her shoulder. She has some dizziness, but she has not had any severe dizzy spells, nor syncope over the past 2 weeks. The remainder of her review of systems is unchanged.    Current Outpatient Prescriptions  Medication Sig Dispense Refill  . aspirin 81 MG tablet Take 81 mg by mouth daily.      Marland Kitchen atorvastatin (LIPITOR) 80 MG tablet Take 80 mg by mouth daily.    . benazepril (LOTENSIN) 20 MG tablet Take 20 mg by mouth daily.    . Calcium Carbonate (CALTRATE 600 PO)  Take 1 tablet by mouth daily.     . hydrochlorothiazide (HYDRODIURIL) 25 MG tablet TAKE 1 TABLET BY MOUTH EVERY DAY 90 tablet 3  . ibuprofen (ADVIL,MOTRIN) 200 MG tablet Take 200 mg by mouth every 6 (six) hours as needed for pain or headache (headache).    Marland Kitchen NITROSTAT 0.3 MG SL tablet Place 0.3 mg under the tongue every 5 (five) minutes as needed for chest pain.     Marland Kitchen timolol (TIMOPTIC) 0.5 % ophthalmic solution Place 1 drop into both eyes 2 (two) times daily.    . Vitamin D, Ergocalciferol, (DRISDOL) 50000 UNITS CAPS Take 50,000 Units by mouth every 7 (seven) days. On Tuesday    . zolpidem (AMBIEN) 5 MG tablet Take 5 mg by mouth at bedtime as needed for sleep (sleep).     No current facility-administered medications for this visit.      Physical Exam:   BP 138/70 mmHg  Pulse 77  Resp 20  Ht  (1.549 m)  Wt 150 lb (68.04 kg)  BMI 28.36 kg/m2  SpO2 97%  General:  Well-appearing  Chest:   Clear  CV:   Regular rate and rhythm with harsh systolic murmur  Incisions:  n/a  Abdomen:  Soft and nontender  Extremities:  Warm and well perfused, no lower extremity edema  Diagnostic Tests:  Cardiac TAVR CT  TECHNIQUE: The patient was scanned on a Philips 256 scanner. A 120 kV retrospective scan was triggered in the descending thoracic aorta at 111 HU's. Gantry rotation speed was 270 msecs and  collimation was .9 mm. 5 mg of iv metoprolol and no nitro were given. The 3D data set was reconstructed in 5% intervals of the R-R cycle. Systolic and diastolic phases were analyzed on a dedicated work station using MPR, MIP and VRT modes. The patient received 80 cc of contrast.  FINDINGS: Aortic Valve: A 21 mm Mitroflow pericardial bioprosthetic valve is in the aortic position. Aortic valve leaflets are moderately thickened and mildly to moderately restricted leaflet opening.  Aorta: There is aneurysmal dilatation of the ascending thoracic aorta with maximum diameter 49 x 46 mm. Aortic  arch and descending thoracic aorta are normal caliber. No dissection. Moderate diffuse calcifications.  Sinotubular Junction: 25 x 26 mm  Ascending Thoracic Aorta: 49 x 46 mm  Aortic Arch: 25 x 24 mm  Descending Thoracic Aorta: 25 x 24 mm  Coronary Artery Height above Annulus:  Left Main: 6 mm  Right Coronary: 11 mm  Virtual Basal Annulus Measurements:  Maximum/Minimum Diameter: 21 x 21 mm  Area: 370 mm2  Aortic valve area: 0.9 cm2  Optimum Fluoroscopic Angle for Delivery: CAO 13, CAU 10  IMPRESSION: 1. A 21 mm Mitroflow pericardial bioprosthetic valve is in the aortic position. Aortic valve leaflets are moderately thickened and mildly to moderately restricted leaflet opening. Annulus is small measuring 21 x 21 mm with area 370 mm2 suitable for 20 mm S3 valve.  2. There is aneurysmal dilatation of the ascending thoracic aorta with maximum diameter 49 x 46 mm. Normal caliber of the remaining portions of the thoracic aorta.  3. Optimum Fluoroscopic Angle for Delivery: CAO 13, CAU 10  Tobias Alexander  Electronically Signed: By: Tobias Alexander On: 07/20/2014 18:07  OVER-READ INTERPRETATION CT CHEST  The following report is an over-read performed by radiologist Dr. [facility] on 07/21/2014. This over-read does not include interpretation of cardiac or coronary anatomy or pathology. The CTA interpretation by the cardiologist is attached.  FINDINGS: There are scattered areas of bronchial wall thickening with mild cylindrical bronchiectasis and thickening of the peribronchovascular interstitium, with associated architectural distortion and scarring, most evident in the inferior segment of the lingula and in the right middle lobe. Other patchy areas of peribronchovascular micronodularity with a tree-in-bud appearance are noted in the lungs bilaterally, most evident in the periphery of the left upper  lobe, most compatible with areas of mucoid impaction within terminal bronchioles. No other larger more suspicious appearing pulmonary nodules or masses are noted in the visualized portions of the thorax. Mildly enlarged 11 mm short axis low right paratracheal lymph node is noted, however, this has a normal appearing fatty hilum. Small hiatal hernia. Visualized portions of the upper abdomen are unremarkable. Median sternotomy wires. Aneurysmal dilatation of the ascending thoracic aorta, as described in the attached report. There are no aggressive appearing lytic or blastic lesions noted in the visualized portions of the skeleton.  IMPRESSION: 1. 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 Clinical correlation is recommended, with followup nonemergent high-resolution chest CT suggested in the near future to better evaluate these findings and establish a baseline for future followup examinations. 2. Small hiatal hernia.   Electronically Signed  By: Trudie Reed M.D.  On: 07/21/2014 10:24    CTA ABDOMEN AND PELVIS WITH CONTRAST  TECHNIQUE: Multidetector CT imaging of the abdomen and pelvis was performed using the standard protocol during bolus administration of intravenous contrast. Multiplanar reconstructed images and MIPs were  obtained and reviewed to evaluate the vascular anatomy.  CONTRAST: 80mL OMNIPAQUE IOHEXOL 350 MG/ML SOLN  COMPARISON: None.  FINDINGS: CT ABDOMEN AND PELVIS FINDINGS  Lower chest: Small hiatal hernia. Otherwise, unremarkable.  Hepatobiliary: Common bile duct is severely dilated measuring up to 21 mm in the porta hepatis. No definite ductal stone identified. No significant intrahepatic biliary ductal dilatation noted. Status post cholecystectomy. Sub cm low-attenuation lesion in segment 2 of the liver is too small  to definitively characterize, but is statistically likely a tiny cyst. The liver has a slightly shrunken appearance and nodular contour, suggestive of underlying cirrhosis. No hypervascular hepatic lesion noted.  Pancreas: Unremarkable.  Spleen: Unremarkable.  Adrenals/Urinary Tract: Bilateral adrenal glands and bilateral kidneys are normal in appearance. No hydroureteronephrosis. Urinary bladder is normal in appearance.  Stomach/Bowel: Normal appearance of the stomach. No pathologic dilatation of small bowel or colon. Numerous colonic diverticuli are noted, without surrounding inflammatory changes to suggest an acute diverticulitis at this time.  Vascular/Lymphatic: Extensive atherosclerosis throughout the abdominal and pelvic vasculature, with vascular findings and measurements pertinent to potential TAVR procedure, as detailed below. No lymphadenopathy noted in the abdomen or pelvis.  Reproductive: Status post hysterectomy. Ovaries are not confidently identified may be surgically absent or atrophic.  Other: No significant volume of ascites. No pneumoperitoneum. Small right inguinal hernia containing only fat incidentally noted.  Musculoskeletal: There are no aggressive appearing lytic or blastic lesions noted in the visualized portions of the skeleton.  VASCULAR MEASUREMENTS PERTINENT TO TAVR:  AORTA:  Minimal Aortic Diameter - 9 x 7 mm  Severity of Aortic Calcification - Severe  RIGHT PELVIS:  Right Common Iliac Artery -  Minimal Diameter - 6.4 x 8.2 mm  Tortuosity - Mild  Calcification - Moderate to severe  Right External Iliac Artery -  Minimal Diameter - 5.8 x 5.4 mm  Tortuosity - Mild  Calcification - Mild  Right Common Femoral Artery -  Minimal Diameter - 5.3 x 4.7 mm  Tortuosity - Mild  Calcification - Mild  LEFT PELVIS:  Left Common Iliac Artery -  Minimal Diameter - 6.6 x 7.1 mm  Tortuosity -  Mild  Calcification - Moderate to severe  Left External Iliac Artery -  Minimal Diameter - 5.8 x 4.3 mm  Tortuosity - Mild  Calcification - Mild  Left Common Femoral Artery -  Minimal Diameter - 6.1 x 4.6 mm  Tortuosity - Mild  Calcification - Mild  Review of the MIP images confirms the above findings.  IMPRESSION: 1. Vascular findings and measurements pertinent to potential TAVR procedure, as detailed above. This patient does not appear to have suitable pelvic arterial access. 2. Status post cholecystectomy. There is severe dilatation of the common bile duct measuring up to 21 mm in the porta hepatis. No definite ductal stone noted. No intrahepatic biliary ductal dilatation. This finding is of uncertain etiology and significance, but is favored to reflect post cholecystectomy physiology and advanced age. Clinical correlation for signs and symptoms of biliary tract obstruction is recommended, however. 3. Colonic diverticulosis without findings to suggest acute diverticulitis at this time. 4. Additional incidental findings, as above.   Electronically Signed  By: Trudie Reedaniel Entrikin M.D.  On: 07/27/2014 13:56   Impression:  The patient has stage D severe symptomatic aortic stenosis with severe prosthetic valve dysfunction, status post aortic valve replacement using a bioprosthetic tissue valve in 2009. She presents with accelerating symptoms of exertional pain in her left shoulder consistent angina pectoris, currently functional class 3-4. Interestingly,  the patient states that her symptoms are very similar to the symptoms she had prior to her aortic valve replacement and coronary artery bypass grafting in 2009. The patient has severe three-vessel coronary artery disease involving the native coronary arteries, but all of her bypass grafts placed at the time of surgery in 2009 remain widely patent and free of significant disease. Left ventricular systolic  function remains reasonably well-preserved.  I have again personally reviewed the patient's most recent transthoracic echocardiogram which was performed more than 6 months ago. I have also reviewed the patient's more recent diagnostic cardiac catheterization and CT angiograms, all of which were reviewed with a multidisciplinary team of specialists.  The patient has severe prosthetic valve dysfunction with calcification and somewhat restricted leaflet mobility involving at least 2 of the 3 leaflets of her valve. There is at least mild to moderate aortic insufficiency as well. The patient's aortic root and bioprosthetic tissue valve are relatively small in size, which further exacerbates the functional significance of its leaflet dysfunction and exacerbate the degree of stenosis. CT angiography demonstrates moderate aneurysmal enlargement of the ascending thoracic aorta with maximum transverse diameter 4.9 cm.  Risks associated with conventional surgical redo aortic valve replacement would be at least moderately elevated because of the patient's advanced age and comorbid risk factors, and I feel that risks would likely be considerably higher than that predicted using the STS risk calculator because of the likelihood that aortic root replacement would be required at the time of surgery. Transcatheter aortic valve replacement might be a reasonable alternative, although the relatively small size of her existing valve would mandate use of a relatively small transcatheter device (20 mm Sapien 3 transcatheter heart valve).  This would clearly leave the patient with significant residual aortic stenosis and somewhat uncertain long term outcome.  CT angiography of the abdomen and pelvis demonstrate relatively small vessel diameters with severe calcification involving the infrarenal aorta, although the iliac vessels are relatively free of significant calcification or tortuosity.  A transfemoral approach remains an option for  transcatheter aortic valve replacement, although pelvic arterial access is clearly borderline at best.   Plan:  After review of the patient's films with a multidisciplinary team of specialists, we have recommended that the patient undergo transesophageal echocardiogram to further characterize the relative degree of re-stenosis in an effort to better characterize expectations for hemodynamic results following TAVR.  I have again reviewed options at length with the patient and her husband in the office this afternoon.  Rationale for proceeding with TEE was discussed. All of their questions have been addressed. The patient will return for follow-up in 2 weeks to review the results of her TEE and discuss treatment options further.  All of their questions been addressed.   I spent in excess of 30 minutes during the conduct of this office consultation and >50% of this time involved direct face-to-face encounter with the patient for counseling and/or coordination of their care.   Salvatore Decent. Cornelius Moras, MD 08/04/2014 3:05 PM

## 2014-08-10 NOTE — Interval H&P Note (Signed)
History and Physical Interval Note:  08/10/2014 9:04 AM  [name]  has presented today for surgery, with the diagnosis of aortic stenosis  The various methods of treatment have been discussed with the patient and family. After consideration of risks, benefits and other options for treatment, the patient has consented to  Procedure(s): TRANSESOPHAGEAL ECHOCARDIOGRAM (TEE) (N/A) as a surgical intervention .  The patient's history has been reviewed, patient examined, no change in status, stable for surgery.  I have reviewed the patient's chart and labs.  Questions were answered to the patient's satisfaction.     Lars MassonNELSON, Elton Catalano H

## 2014-08-11 ENCOUNTER — Encounter (HOSPITAL_COMMUNITY): Payer: Self-pay | Admitting: Cardiology

## 2014-08-15 ENCOUNTER — Encounter: Payer: Self-pay | Admitting: Thoracic Surgery (Cardiothoracic Vascular Surgery)

## 2014-08-15 ENCOUNTER — Telehealth: Payer: Self-pay | Admitting: Thoracic Surgery (Cardiothoracic Vascular Surgery)

## 2014-08-15 DIAGNOSIS — Z953 Presence of xenogenic heart valve: Secondary | ICD-10-CM

## 2014-08-15 DIAGNOSIS — I350 Nonrheumatic aortic (valve) stenosis: Secondary | ICD-10-CM

## 2014-08-15 NOTE — Telephone Encounter (Signed)
Called patient to discuss results of recent TEE.  Options reviewed including the possibility of valve-in-valve TAVR.  Patient desires to keep her previously scheduled appointment in the office next week to discuss options further.  All questions answered.  Dianne Whelchel H 08/15/2014 8:43 AM

## 2014-08-21 ENCOUNTER — Ambulatory Visit (INDEPENDENT_AMBULATORY_CARE_PROVIDER_SITE_OTHER): Payer: Medicare HMO | Admitting: Thoracic Surgery (Cardiothoracic Vascular Surgery)

## 2014-08-21 ENCOUNTER — Encounter: Payer: Self-pay | Admitting: Thoracic Surgery (Cardiothoracic Vascular Surgery)

## 2014-08-21 ENCOUNTER — Other Ambulatory Visit: Payer: Self-pay | Admitting: *Deleted

## 2014-08-21 VITALS — BP 146/76 | HR 80 | Resp 20 | Ht 61.0 in | Wt 150.0 lb

## 2014-08-21 DIAGNOSIS — I350 Nonrheumatic aortic (valve) stenosis: Secondary | ICD-10-CM

## 2014-08-21 DIAGNOSIS — Z953 Presence of xenogenic heart valve: Secondary | ICD-10-CM

## 2014-08-21 DIAGNOSIS — Z951 Presence of aortocoronary bypass graft: Secondary | ICD-10-CM

## 2014-08-21 DIAGNOSIS — Z954 Presence of other heart-valve replacement: Secondary | ICD-10-CM

## 2014-08-21 NOTE — Patient Instructions (Signed)
   Patient should continue taking all medications without change through the day before surgery.  Patient should have nothing to eat or drink after midnight the night before surgery.  On the morning of surgery patient should take only your eye drops.

## 2014-08-21 NOTE — Progress Notes (Signed)
[facility]   MULTIDISCIPLIN[facility]ART VALVE CLINIC       CARDIOTHORACIC SURGERY NOTE  Primary Cardiologist is [name], MD  Referring Provider is Loreli Slot, MD PCP is Tarri Fuller, MD   HPI:  Patient returns to the office today for recurrent stage D severe symptomatic aortic stenosis related to prosthetic valve dysfunction, originally status post aortic valve replacement using a bioprosthetic tissue valve in 2009. She was originally seen in consultation by Dr. Dorris Fetch on 07/12/2014 after which time she was referred for a second surgical opinion to discuss valve-in-valve transcatheter aortic valve replacement as an alternative to high-risk conventional surgery. I had the opportunity to see her on 07/18/2014 and she was seen most recently on 08/04/2014. Since then she underwent transesophageal echocardiogram by Dr. Delton See. She returns to the office today to review the results of this test and discuss treatment options further.  She states that after she underwent transesophageal echocardiogram she developed a productive cough. She was seen by her primary care physician and has been treated with oral antibiotics and bronchodilators. The cough has improved considerably and now has almost completely resolved. She otherwise reports no problems or complaints.   Current Outpatient Prescriptions  Medication Sig Dispense Refill  . amoxicillin (AMOXIL) 500 MG tablet Take 500 mg by mouth 2 (two) times daily.    Marland Kitchen aspirin 81 MG tablet Take 81 mg by mouth daily.      Marland Kitchen atorvastatin (LIPITOR) 80 MG tablet Take 80 mg by mouth daily.    . benazepril (LOTENSIN) 20 MG tablet Take 20 mg by mouth daily.    . benzonatate (TESSALON) 100 MG capsule Take by mouth 3 (three) times daily.    . Calcium Carbonate (CALTRATE 600 PO) Take 1 tablet by mouth daily.     . fexofenadine (ALLEGRA) 180 MG tablet Take 180 mg by mouth daily.    . fluticasone (VERAMYST) 27.5 MCG/SPRAY  nasal spray Place 2 sprays into the nose 2 (two) times daily.    . hydrochlorothiazide (HYDRODIURIL) 25 MG tablet TAKE 1 TABLET BY MOUTH EVERY DAY 90 tablet 3  . ibuprofen (ADVIL,MOTRIN) 200 MG tablet Take 200 mg by mouth every 6 (six) hours as needed for pain or headache (headache).    Marland Kitchen NITROSTAT 0.3 MG SL tablet Place 0.3 mg under the tongue every 5 (five) minutes as needed for chest pain.     Marland Kitchen timolol (TIMOPTIC) 0.5 % ophthalmic solution Place 1 drop into both eyes 2 (two) times daily.    . Vitamin D, Ergocalciferol, (DRISDOL) 50000 UNITS CAPS Take 50,000 Units by mouth every 7 (seven) days. On Tuesday    . zolpidem (AMBIEN) 5 MG tablet Take 5 mg by mouth at bedtime as needed for sleep (sleep).     No current facility-administered medications for this visit.      Physical Exam:   BP 146/76 mmHg  Pulse 80  Resp 20  Ht  (1.549 m)  Wt 150 lb (68.04 kg)  BMI 28.36 kg/m2  SpO2 98%  General:  Well-appearing  Chest:   Clear  CV:   Regular rate and rhythm with prominent harsh systolic murmur  Incisions:  n/a  Abdomen:  Soft and nontender  Extremities:  Warm and well-perfused  Diagnostic Tests:  Transesophageal Echocardiography  (Report amended )  Patient:  [name], [name] MR #:    [id number] Study Date: 08/10/2014 Gender:   F Age:    81 Height:   157.5 cm Weight:  69.5 kg BSA:    1.76 m^2 Pt. Status: Room:  SONOGRAPHER Nolon Rod, RDCS ADMITTING  Tobias Alexander, M.D. ATTENDING  Tobias Alexander, M.D. ORDERING   Tobias Alexander, M.D. PERFORMING  Tobias Alexander, M.D.  cc:  ------------------------------------------------------------------- LV EF: 60% -  65%  ------------------------------------------------------------------- Indications:   Aortic stenosis 424.1.  ------------------------------------------------------------------- Study Conclusions  - Left ventricle: Wall thickness was increased in a pattern  of moderate LVH. Systolic function was normal. The estimated ejection fraction was in the range of 60% to 65%. Wall motion was normal; there were no regional wall motion abnormalities. - Aortic valve: S/P Mitroflow Sorin valve replacement. The leaflets are severely thickened and calcified with almost fixed non-coronary and left coronary cusps and only limited motion of the right coronary cusp. There was severe stenosis. There was moderate regurgitation. Mean gradient (S): 35 mm Hg. Peak gradient (S): 79 mm Hg. - Aorta: There was severe non-mobile atheroma. - Descending aorta: The descending aorta was normal in size. - Mitral valve: Structurally normal valve. There was mild regurgitation. - Left atrium: No evidence of thrombus in the atrial cavity or appendage. No evidence of thrombus in the atrial cavity or appendage. - Right ventricle: Systolic function was normal. - Right atrium: No evidence of thrombus in the atrial cavity or appendage. - Tricuspid valve: There was no significant regurgitation.  Impressions:  - S/P Mitroflow Sorin aortic valve replacement. The leaflets are severely thickened and calcified with almost fixed non-coronary and left coronary cusps and only limited motion of the right coronary cusp. There is severe aortic stenosis (gradients - peak 79 and mean 35 mmHg are off axis and therefore underestimated). There is small annulus measuring 18 mm. There is moderate aortic insufficiency.  Ascending aorta is moderately dilated measuring 48 mm.  Diagnostic transesophageal echocardiography. 2D and color Doppler. Birthdate: Patient birthdate: 1933-04-02. Age: Patient is 81 yr old. Sex: Gender: female.  BMI: 28 kg/m^2. Blood pressure: 190/67 Patient status: Outpatient. Study date: Study date: 08/10/2014. Study time: 09:56 AM. Location:  Endoscopy.  -------------------------------------------------------------------  ------------------------------------------------------------------- Left ventricle:  Wall thickness was increased in a pattern of moderate LVH.  Systolic function was normal. The estimated ejection fraction was in the range of 60% to 65%. Wall motion was normal; there were no regional wall motion abnormalities.  ------------------------------------------------------------------- Aortic valve: S/P Mitroflow Sorin valve replacement. The leaflets are severely thickened and calcified with almost fixed non-coronary and left coronary cusps and only limited motion of the right coronary cusp. Doppler:  There was severe stenosis.  There was moderate regurgitation.  Mean gradient (S): 35 mm Hg. Peak gradient (S): 79 mm Hg.  ------------------------------------------------------------------- Aorta: There was severe non-mobile atheroma. There was no evidence for dissection. Aortic root: The aortic root was not dilated. Ascending aorta: The ascending aorta was moderately dilated measuring 48 mm. Descending aorta: The descending aorta was normal in size.  ------------------------------------------------------------------- Mitral valve:  Structurally normal valve.  Leaflet separation was normal. Doppler: There was mild regurgitation.  ------------------------------------------------------------------- Left atrium: The atrium was normal in size. No evidence of thrombus in the atrial cavity or appendage. No evidence of thrombus in the atrial cavity or appendage. The appendage was morphologically a left appendage, multilobulated, and of normal size. Emptying velocity was normal.  ------------------------------------------------------------------- Right ventricle: The cavity size was normal. Wall thickness was normal. Systolic function was  normal.  ------------------------------------------------------------------- Pulmonic valve:  Structurally normal valve.  ------------------------------------------------------------------- Tricuspid valve:  Structurally normal valve.  Leaflet separation was normal. Doppler: There was no significant  regurgitation.  ------------------------------------------------------------------- Pulmonary artery:  The main pulmonary artery was normal-sized.  ------------------------------------------------------------------- Right atrium: The atrium was normal in size. No evidence of thrombus in the atrial cavity or appendage. The appendage was morphologically a right appendage.  ------------------------------------------------------------------- Pericardium: There was no pericardial effusion.  ------------------------------------------------------------------- Measurements  Aortic valve            Value Aortic valve mean velocity, S   248  cm/s Aortic valve VTI, S        90.8 cm Aortic mean gradient, S      35  mm Hg Aortic peak gradient, S      79  mm Hg  Legend: (L) and (H) mark values outside specified reference range.  ------------------------------------------------------------------- Quay Burow, M.D. 2016-02-04T20:24:14   Impression:  The patient has stage D severe symptomatic aortic stenosis with severe prosthetic valve dysfunction, status post aortic valve replacement using a bioprosthetic tissue valve in 2009. She presents with accelerating symptoms of exertional pain in her left shoulder consistent angina pectoris, currently functional class 3-4. The patient states that her symptoms are very similar to the symptoms she had prior to her aortic valve replacement and coronary artery bypass grafting in 2009. The patient has severe three-vessel coronary artery disease involving the native coronary arteries, but  all of her bypass grafts placed at the time of surgery in 2009 remain widely patent and free of significant disease. Left ventricular systolic function remains well-preserved. I have personally reviewed the patient's recent transesophageal echocardiogram with Dr Excell Seltzer and several other members of a multidisciplinary team of specialists. The patient has severe prosthetic valve dysfunction with calcification and somewhat restricted leaflet mobility involving at least 2 of the 3 leaflets of her valve. There is at least mild to moderate aortic insufficiency as well. The patient's aortic root and bioprosthetic tissue valve are relatively small in size, which further exacerbates the functional significance of its leaflet dysfunction and exacerbate the degree of stenosis. CT angiography demonstrates moderate aneurysmal enlargement of the ascending thoracic aorta with maximum transverse diameter 4.9 cm. Risks associated with conventional surgical redo aortic valve replacement would be at least moderately elevated because of the patient's advanced age and comorbid risk factors, and I feel that risks would likely be considerably higher than that predicted using the STS risk calculator because of the likelihood that aortic root replacement would be required at the time of surgery. We feel that transcatheter aortic valve replacement would be a reasonable alternative, although the relatively small size of her existing valve would mandate use of a relatively small transcatheter device (20 mm Sapien 3 transcatheter heart valve). This would clearly leave the patient with some degree of residual aortic stenosis, but the appearance of TEE suggests that TAVR might provide a significant improvement because of the high degree of leaflet restriction of her current valve. CT angiography of the abdomen and pelvis demonstrate relatively small vessel diameters with severe calcification involving the infrarenal aorta, although the  iliac vessels are relatively free of significant calcification or tortuosity. A transfemoral approach remains an option for transcatheter aortic valve replacement, although pelvic arterial access is clearly borderline.   Plan:  The patient and her husband were counseled at length regarding treatment alternatives for management of severe symptomatic aortic stenosis related to prosthetic valve dysfunction s/p aortic valve replacement with a bioprosthetic tissue valve that has failed. Alternative approaches such as redo conventional aortic valve replacement, transcatheter aortic valve replacement, and palliative medical therapy were compared and contrasted  at length.  The risks associated with redo conventional surgical aortic valve replacement were been discussed in detail, as were expectations for post-operative convalescence. Long-term prognosis with medical therapy was discussed. This discussion was placed in the context of the patient's own specific clinical presentation and past medical history.  All of their questions been addressed.  The patient desires to proceed with transcatheter aortic valve replacement as an alternative to high risk redo conventional surgical aortic valve replacement.  Following the decision to proceed with transcatheter aortic valve replacement, a discussion has been held regarding what types of management strategies would be attempted intraoperatively in the event of life-threatening complications, including whether or not the patient would be considered a candidate for the use of cardiopulmonary bypass and/or conversion to open sternotomy for attempted surgical intervention.  The patient has been advised of a variety of complications that might develop including but not limited to risks of death, stroke, paravalvular leak, aortic dissection or other major vascular complications, aortic annulus rupture, device embolization, cardiac rupture or perforation, mitral regurgitation,  acute myocardial infarction, arrhythmia, heart block or bradycardia requiring permanent pacemaker placement, congestive heart failure, respiratory failure, renal failure, pneumonia, infection, other late complications related to structural valve deterioration or migration, or other complications that might ultimately cause a temporary or permanent loss of functional independence or other long term morbidity.    We tentatively plan to proceed with surgery on Tuesday, 09/05/2014.  The patient understands that we would plan to attempt a transfemoral approach with a small possibility that we may need to convert to transapical approach if the patient's femoral vessels proved to be too small.  The patient provides full informed consent for the procedure as described and all questions were answered.    Salvatore Decentlarence H. Cornelius Moraswen, MD 08/21/2014 2:19 PM

## 2014-08-29 ENCOUNTER — Other Ambulatory Visit: Payer: Self-pay | Admitting: *Deleted

## 2014-08-29 ENCOUNTER — Encounter: Payer: Self-pay | Admitting: Cardiovascular Disease

## 2014-08-29 ENCOUNTER — Ambulatory Visit (INDEPENDENT_AMBULATORY_CARE_PROVIDER_SITE_OTHER): Payer: Medicare HMO | Admitting: Cardiovascular Disease

## 2014-08-29 VITALS — BP 120/78 | HR 63 | Ht 61.0 in | Wt 149.0 lb

## 2014-08-29 DIAGNOSIS — I350 Nonrheumatic aortic (valve) stenosis: Secondary | ICD-10-CM

## 2014-08-29 NOTE — Patient Instructions (Signed)
Your physician recommends that you continue on your current medications as directed. Please refer to the Current Medication list given to you today.     

## 2014-08-29 NOTE — Progress Notes (Signed)
Cardiology Office Note   Date:  08/29/2014   ID:  [name], DOB 1932-07-31, MRN [id number]  PCP:  Tarri Fuller, MD  Cardiologist:  Tonny Bollman, MD    Chief Complaint  Patient presents with  . Aortic Stenosis     History of Present Illness: [name] is a 81 y.o. female who presents for evaluation of severe aortic stenosis.   The patient's cardiac history dates back to 2009 when she presented with progressive exertional angina. Her anginal equivalent over time has been left shoulder and arm pain. She was found to have moderate aortic stenosis and three-vessel coronary artery disease. She ultimately was treated with aortic valve replacement using a 21 mm Sorin Mitroflow bioprosthetic tissue valve and 4 vessel CABG. Septal myectomy was also performed. She did well until approximately 2 years ago when she began having exertional arm pain again.  She complains of exertional left shoulder and arm pain. Symptoms are similar to those she experienced before her original heart surgery. She had complete resolution of these symptoms after surgery, but exertional pain has recurred and now progressed over the past few years. Recently she has had symptoms with low levels of physical exertion. She describes pain as an ache in the neck, shoulder, and left arm. Pain resolves with rest. There is associated shortness of breath. There is also associated dizziness, but no syncope. No central chest pain. Arm pain symptoms occur with walking or other forms of physical exertion.  Because of progressive symptoms, a repeat echocardiogram was done. This demonstrated severe bioprosthetic aortic valve stenosis with preserved LV systolic function. Her peak and mean transaortic valve gradients by echo were 64 and 40 mmHg, respectively. LVEF is 55-60%.  The patient has now undergone extensive evaluation with a gated cardiac CTA, CTA of the chest abdomen and pelvis, cardiac catheterization, and TEE.  Notable findings include continued patency of all of her bypass grafts, confirmation of severe aortic stenosis by invasive hemodynamics with a mean transvalvular gradient of 39 mmHg and the Cath Lab. TEE showed severe bioprosthetic valve leaflet dysfunction with restricted leaflet mobility. The patient has been seen by Dr Cornelius Moras and Dr Dorris Fetch and is felt to be at high risk of conventional surgery because of her previous cardiac surgery and small aortic root which would likely necessitate root enlargement at the time of AVR.    Past Medical History  Diagnosis Date  . Aortic stenosis     a.  s/p tissue AVR at time of CABG in 2009;  b. Echo 04/2012: EF 55-60%, moderate AS (mean 34);  c. Echo 6/14: Mild LVH, mild focal basal septal hypertrophy, EF 55-60%, normal wall motion, grade 2 diastolic dysfunction, AVR with moderate aortic stenosis (mean 36) - consider TEE if clinically indicated, mild AI, mild MR, PASP 44, ascending aorta mild to moderately dilated (consider CTA or MRA)  . Dyslipidemia   . Hypertension   . Spinal arthritis   . CAD (coronary artery disease)     a. s/p CABG; b. Myoview 06/2011: No ischemia, EF 67%;  c. 01/2013 Cath: LM min irregs, LAD small, LCX 149m OMs ok, RCA known 100, VG->RCA ok, VG->OM1->2 ok, LIMA->LAD ok.  Marland Kitchen Hx of CABG     LIMA-LAD, SVG-RCA, SVG-OM1/OM2 in 2009  . Blindness of right eye     due to retinal bleed  . Glaucoma   . Left bundle branch block   . Carotid stenosis     Carotid U/S 5/13:  bilat 40-59%  .  Shortness of breath   . Heart murmur   . Chronic kidney disease     renal insufficiency  . GERD (gastroesophageal reflux disease)   . Arthritis   . Blind right eye   . PONV (postoperative nausea and vomiting)     nausea  . HOH (hard of hearing)   . S/P aortic valve replacement with bioprosthetic valve 07/28/2007    #14mm Sorin Mitroflow pericardial tissue valve    . S/P CABG x 4 07/28/2007    LIMA to LAD, SVG to RCA, sequential SVG to OM1-OM2  .  Unstable angina 01/14/2013  . Occlusion and stenosis of carotid artery without mention of cerebral infarction 11/28/2011  . Aortic stenosis, severe 07/03/2014    Recurrent, due to prosthetic valve stenosis   . Prosthetic valve dysfunction   . Renal insufficiency     Past Surgical History  Procedure Laterality Date  . Coronary artery bypass graft  Jan 2009    LIMA to LAD, SVG to RCA, SVG to OM 1 & 2  . Aortic valve replacement  Jan 2009    #21 mm pericardial prosthesis  . Back surgery    . Neck surgery    . Abdominal hysterectomy    . Cholecystectomy    . Eye surgery    . Cardiac catheterization  2014  . Appendectomy    . Posterior cervical laminectomy Left 11/04/2013    Procedure: Left Cervical Four-Five Foraminotomy ;  Surgeon: Temple Pacini, MD;  Location: [facility]UR[facility] ORS;  Service: Neurosurgery;  Laterality: Left;  Left Cervical Four-Five Foraminotomy   . Left and right heart catheterization with coronary/graft angiogram N/A 01/13/2013    Procedure: LEFT AND RIGHT HEART CATHETERIZATION WITH Isabel Caprice;  Surgeon: [name], MD;  Location: [facility] CATH LAB;  Service: Cardiovascular;  Laterality: N/A;  . Left heart catheterization with coronary/graft angiogram N/A 07/03/2014    Procedure: LEFT HEART CATHETERIZATION WITH Isabel Caprice;  Surgeon: Micheline Chapman, MD;  Location: [facility] CATH LAB;  Service: Cardiovascular;  Laterality: N/A;  . Tee without cardioversion N/A 08/10/2014    Procedure: TRANSESOPHAGEAL ECHOCARDIOGRAM (TEE);  Surgeon: Lars Masson, MD;  Location: [facility] ENDOSCOPY;  Service: Cardiovascular;  Laterality: N/A;    Current Outpatient Prescriptions  Medication Sig Dispense Refill  . aspirin 81 MG tablet Take 81 mg by mouth daily.      Marland Kitchen atorvastatin (LIPITOR) 80 MG tablet Take 80 mg by mouth daily.    . benazepril (LOTENSIN) 20 MG tablet Take 20 mg by mouth daily.    . Calcium Carbonate (CALTRATE 600 PO) Take 1 tablet by mouth daily.     .  fexofenadine (ALLEGRA) 180 MG tablet Take 180 mg by mouth daily.    . hydrochlorothiazide (HYDRODIURIL) 25 MG tablet TAKE 1 TABLET BY MOUTH EVERY DAY 90 tablet 3  . ibuprofen (ADVIL,MOTRIN) 200 MG tablet Take 200 mg by mouth every 6 (six) hours as needed for pain or headache (headache).    Marland Kitchen NITROSTAT 0.3 MG SL tablet Place 0.3 mg under the tongue every 5 (five) minutes as needed for chest pain.     Marland Kitchen timolol (TIMOPTIC) 0.5 % ophthalmic solution Place 1 drop into both eyes 2 (two) times daily.    . Vitamin D, Ergocalciferol, (DRISDOL) 50000 UNITS CAPS Take 50,000 Units by mouth every 7 (seven) days. On Tuesday    . zolpidem (AMBIEN) 5 MG tablet Take 5 mg by mouth at bedtime as needed for sleep (sleep).  No current facility-administered medications for this visit.    Allergies:   Zetia   Social History:  The patient  reports that she has never smoked. She has never used smokeless tobacco. She reports that she does not drink alcohol or use illicit drugs.   Family History:  The patient's  family history includes Cancer in her mother; Heart attack in her brother and father.    ROS:  Please see the history of present illness.  Otherwise, review of systems is positive for hearing loss, cough, dizziness, easy bruising, problems with balance.  All other systems are reviewed and negative.    PHYSICAL EXAM: VS:  BP 120/78 mmHg  Pulse 63  Ht 5\' 1"  (1.549 m)  Wt 149 lb (67.586 kg)  BMI 28.17 kg/m2 , BMI Body mass index is 28.17 kg/(m^2). GEN: Well nourished, well developed, in no acute distress HEENT: normal Neck: no JVD, no masses. Bilateral carotid upstrokes are diminished with bruits.  Cardiac: RRR with grade 3/6 harsh systolic murmur at the RUSB             Respiratory:  clear to auscultation bilaterally, normal work of breathing GI: soft, nontender, nondistended, + BS MS: no deformity or atrophy Ext: no pretibial edema Skin: warm and dry, no rash Neuro:  Strength and sensation are  intact Psych: euthymic mood, full affect  EKG:  EKG is not ordered today.  Recent Labs: 06/27/2014: ALT 14; Hemoglobin 11.7*; Platelets 217 08/04/2014: BUN 30*; Creatinine 1.53*; Potassium 4.7; Sodium 135   Lipid Panel     Component Value Date/Time   CHOL 175 05/10/2013 1005   TRIG 198.0* 05/10/2013 1005   HDL 37.40* 05/10/2013 1005   CHOLHDL 5 05/10/2013 1005   VLDL 39.6 05/10/2013 1005   LDLCALC 98 05/10/2013 1005   LDLDIRECT 126.2 09/12/2011 0829      Wt Readings from Last 3 Encounters:  08/29/14 149 lb (67.586 kg)  08/21/14 150 lb (68.04 kg)  08/04/14 150 lb (68.04 kg)     Cardiac Studies Reviewed: 2D ECHO Study Conclusions  - Left ventricle: The cavity size was normal. Wall thickness was increased in a pattern of moderate LVH. Systolic function was normal. The estimated ejection fraction was in the range of 55% to 60%. Wall motion was normal; there were no regional wall motion abnormalities. Doppler parameters are consistent with abnormal left ventricular relaxation (grade 1 diastolic dysfunction). - Aortic valve: Trileaflet; severely calcified leaflets. There was severe stenosis. There was mild regurgitation. Mean gradient (S): 42 mm Hg. Peak gradient (S): 64 mm Hg. Valve area (VTI): 0.82 cm^2. - Mitral valve: Mildly to moderately calcified annulus. Mildly calcified leaflets . There was trivial regurgitation. - Left atrium: The atrium was mildly dilated. - Right ventricle: The cavity size was normal. Systolic function was normal. - Tricuspid valve: Peak RV-RA gradient (S): 31 mm Hg. - Pulmonary arteries: PA peak pressure: 34 mm Hg (S). - Inferior vena cava: The vessel was normal in size. The respirophasic diameter changes were in the normal range (= 50%), consistent with normal central venous pressure.  Impressions:  - Normal LV size with moderate LV hypertrophy. EF 55-60%. Normal RV size and systolic function. Severe aortic  stenosis with mild aortic insufficiency.  TEE: S/P Mitroflow Sorin aortic valve replacement. The leaflets are severely thickened and calcified with almost fixed non-coronary and left coronary cusps and only limited motion of the right coronary cusp. There is severe aortic stenosis (gradients - peak 79 and mean 35 mmHg are  off axis and therefore underestimated). There is small annulus measuring 18 mm.  There is moderate aortic insufficiency.   Ascending aorta is moderately dilated measuring 48 mm.  CARDIAC CATH: Procedural Findings: Hemodynamics: AO 196/25 LV 162/56 with a mean of 90 Aortic mean gradient 39 mmHg  Coronary angiography: Coronary dominance: right  Left mainstem: The left main stem is ectatic. There is heavy calcification without obstructive disease.  Left anterior descending (LAD): The LAD is severely calcified. The vessel has 90% proximal stenosis. The first diagonal is seen to fill faintly. The mid and distal LAD fill from the mammary graft.  Left circumflex (LCx): The left circumflex appears to have severe ostial stenosis. This is estimated at 80%. The mid circumflex is totally occluded. The obtuse marginal branches fill entirely from a sequential saphenous vein graft.  Right coronary artery (RCA): Severe calcification with total occlusion proximally.  LIMA to LAD: Widely patent to the mid LAD. The mid and distal LAD are small in caliber. The proximal LAD fills retrograde from the graft and this fills the diagonal branch.  Saphenous vein graft sequential to OM1 and OM 2: Widely patent with graft ectasia noted. There are diffuse irregularities. There are no significant stenoses. Multiple OM subbranch is fill from this bypass graft.  Saphenous vein graft to distal RCA: Patent throughout. No significant stenosis identified. The native RCA fills retrograde from the graft. The PDA and PLA branches fill from the graft.  Left ventriculography: Ventriculography was  deferred because of kidney disease. Plain fluoroscopy demonstrates severe calcification and restricted mobility of prosthetic aortic valve leaflets.  Estimated Blood Loss: Minimal  Final Conclusions:  1. Severe native three-vessel coronary artery disease 2. Status post multivessel CABG with continued patency of the LIMA to LAD, sequential saphenous vein graft to OM1 and OM 2, and saphenous vein graft to distal RCA 3. Severe bioprosthetic aortic valve stenosis  Recommendations: TCTS evaluation for redo AVR versus valve-in-valve TAVR.   CTA Chest/Abdomen/Pelvis: VASCULAR MEASUREMENTS PERTINENT TO TAVR:  AORTA:  Minimal Aortic Diameter - 9 x 7 mm  Severity of Aortic Calcification - Severe  RIGHT PELVIS:  Right Common Iliac Artery -  Minimal Diameter - 6.4 x 8.2 mm  Tortuosity - Mild  Calcification - Moderate to severe  Right External Iliac Artery -  Minimal Diameter - 5.8 x 5.4 mm  Tortuosity - Mild  Calcification - Mild  Right Common Femoral Artery -  Minimal Diameter - 5.3 x 4.7 mm  Tortuosity - Mild  Calcification - Mild  LEFT PELVIS:  Left Common Iliac Artery -  Minimal Diameter - 6.6 x 7.1 mm  Tortuosity - Mild  Calcification - Moderate to severe  Left External Iliac Artery -  Minimal Diameter - 5.8 x 4.3 mm  Tortuosity - Mild  Calcification - Mild  Left Common Femoral Artery -  Minimal Diameter - 6.1 x 4.6 mm  Tortuosity - Mild  Calcification - Mild  Review of the MIP images confirms the above findings.  IMPRESSION: 1. Vascular findings and measurements pertinent to potential TAVR procedure, as detailed above. This patient does not appear to have suitable pelvic arterial access. 2. Status post cholecystectomy. There is severe dilatation of the common bile duct measuring up to 21 mm in the porta hepatis. No definite ductal stone noted. No intrahepatic biliary ductal dilatation. This finding is of  uncertain etiology and significance, but is favored to reflect post cholecystectomy physiology and advanced age. Clinical correlation for signs and symptoms of biliary tract obstruction  is recommended, however. 3. Colonic diverticulosis without findings to suggest acute diverticulitis at this time. 4. Additional incidental findings, as above.  STS Risk Calculator ProcedureRedo AVR  Risk of Mortality5.7% Morbidity or Mortality24.9% Prolonged LOS10.3% Short LOS22.3% Permanent Stroke2.9% Prolonged Vent Support19.4% DSW Infection0.1% Renal Failure6.0% Reoperation8.3%  ASSESSMENT AND PLAN: 81 year old woman with stage D severe symptomatic aortic stenosis. Her symptoms are highly typical of angina that I suspect is related to aortic stenosis since all of her bypass grafts are patent. Symptoms have now progressed over the last 2 years and she has functional class III symptoms at present.  I have reviewed the natural history of aortic stenosis with the patient and her husband who is present today. We have discussed the limitations of medical therapy and the poor prognosis associated with symptomatic aortic stenosis. We have also reviewed potential treatment options, including palliative medical therapy, conventional surgical aortic valve replacement, and transcatheter aortic valve replacement. We discussed treatment options in the context of this patient's specific comorbid medical conditions.   She has undergone evaluation by Dr Dorris Fetch and Dr Cornelius Moras, and we have had extensive discussion about her case. We feel that valve-in-valve TAVR is an appropriate  alternative treatment to high-risk conventional redo surgery in this elderly woman who would likely require aortic root replacement at the time of aortic valve replacement. We plan to treat her with a 20 mm Sapien 3 THV based on the size of her previous bioprosthesis. The patient's iliofemoral vessels are borderline for placement of a 14 Fr E-sheath, but it seems reasonable to attempt a transfemoral approach. She understands that if this is not technically feasible, we will proceed with a transapical approach.  Following the decision to proceed with transcatheter aortic valve replacement, a discussion has been held regarding what types of management strategies would be attempted intraoperatively in the event of life-threatening complications, including whether or not the patient would be considered a candidate for the use of cardiopulmonary bypass and/or conversion to open sternotomy for attempted surgical intervention.  The patient has been advised of a variety of complications that might develop including but not limited to risks of death, stroke, paravalvular leak, aortic dissection or other major vascular complications, aortic annulus rupture, device embolization, cardiac rupture or perforation, mitral regurgitation, acute myocardial infarction, arrhythmia, heart block or bradycardia requiring permanent pacemaker placement, congestive heart failure, respiratory failure, renal failure, pneumonia, infection, other late complications related to structural valve deterioration or migration, or other complications that might ultimately cause a temporary or permanent loss of functional independence or other long term morbidity.  The patient provides full informed consent for the procedure as described and all questions were answered.  Current medicines are reviewed with the patient today.  The patient has no concerns regarding medicines.  The following changes have been made:  no change  Labs/ tests ordered  today include:  No orders of the defined types were placed in this encounter.    Signed, Tonny Bollman, MD  08/29/2014 1:03 PM    [facility] [address] [place], [place], [place]  [address] Phone: [phone]; Fax: [phone]

## 2014-09-01 ENCOUNTER — Encounter (HOSPITAL_COMMUNITY): Payer: Self-pay

## 2014-09-01 ENCOUNTER — Encounter (HOSPITAL_COMMUNITY)
Admission: RE | Admit: 2014-09-01 | Discharge: 2014-09-01 | Disposition: A | Discharge status: Home / Self Care | Payer: Medicare HMO | Source: Ambulatory Visit | Attending: Cardiovascular Disease | Admitting: Cardiovascular Disease

## 2014-09-01 ENCOUNTER — Ambulatory Visit (HOSPITAL_COMMUNITY)
Admission: RE | Admit: 2014-09-01 | Discharge: 2014-09-01 | Disposition: A | Discharge status: Home / Self Care | Payer: Medicare HMO | Source: Ambulatory Visit | Attending: Cardiovascular Disease | Admitting: Cardiovascular Disease

## 2014-09-01 VITALS — BP 169/59 | HR 69 | Temp 97.6°F | Resp 20 | Ht 61.0 in | Wt 159.1 lb

## 2014-09-01 DIAGNOSIS — I350 Nonrheumatic aortic (valve) stenosis: Secondary | ICD-10-CM

## 2014-09-01 HISTORY — DX: Personal history of other diseases of the digestive system: Z87.19

## 2014-09-01 LAB — COMPREHENSIVE METABOLIC PANEL
ALT: 14 U/L (ref 0–35)
ANION GAP: 6 (ref 5–15)
AST: 23 U/L (ref 0–37)
Albumin: 4.1 g/dL (ref 3.5–5.2)
Alkaline Phosphatase: 81 U/L (ref 39–117)
BILIRUBIN TOTAL: 1.4 mg/dL — AB (ref 0.3–1.2)
BUN: 25 mg/dL — AB (ref 6–23)
CHLORIDE: 108 mmol/L (ref 96–112)
CO2: 21 mmol/L (ref 19–32)
Calcium: 9.5 mg/dL (ref 8.4–10.5)
Creatinine, Ser: 1.59 mg/dL — ABNORMAL HIGH (ref 0.50–1.10)
GFR calc Af Amer: 35 mL/min — ABNORMAL LOW (ref >90)
GFR, EST NON AFRICAN AMERICAN: 30 mL/min — AB (ref >90)
Glucose, Bld: 101 mg/dL — ABNORMAL HIGH (ref 70–99)
Potassium: 4.1 mmol/L (ref 3.5–5.1)
Sodium: 135 mmol/L (ref 135–145)
Total Protein: 7.2 g/dL (ref 6.0–8.3)

## 2014-09-01 LAB — BLOOD GAS, ARTERIAL
Acid-base deficit: 1.7 mmol/L (ref 0.0–2.0)
Bicarbonate: 22.2 mEq/L (ref 20.0–24.0)
Drawn by: 206361
FIO2: 0.21 %
O2 Saturation: 96.2 %
PH ART: 7.417 (ref 7.350–7.450)
Patient temperature: 98.6
TCO2: 23.3 mmol/L (ref 0–100)
pCO2 arterial: 35.1 mmHg (ref 35.0–45.0)
pO2, Arterial: 80.6 mmHg (ref 80.0–100.0)

## 2014-09-01 LAB — CBC
HEMATOCRIT: 33.7 % — AB (ref 36.0–46.0)
HEMOGLOBIN: 11.1 g/dL — AB (ref 12.0–15.0)
MCH: 28.3 pg (ref 26.0–34.0)
MCHC: 32.9 g/dL (ref 30.0–36.0)
MCV: 86 fL (ref 78.0–100.0)
Platelets: 202 10*3/uL (ref 150–400)
RBC: 3.92 MIL/uL (ref 3.87–5.11)
RDW: 14.3 % (ref 11.5–15.5)
WBC: 8.8 10*3/uL (ref 4.0–10.5)

## 2014-09-01 LAB — APTT: aPTT: 31 seconds (ref 24–37)

## 2014-09-01 LAB — SURGICAL PCR SCREEN
MRSA, PCR: POSITIVE — AB
STAPHYLOCOCCUS AUREUS: POSITIVE — AB

## 2014-09-01 LAB — PROTIME-INR
INR: 0.98 (ref 0.00–1.49)
Prothrombin Time: 13.1 seconds (ref 11.6–15.2)

## 2014-09-01 NOTE — Progress Notes (Signed)
Anesthesia Chart Review:  Pt is 81 year old female scheduled for transcatheter aortic valve replacement, transfemoral, TEE on 09/05/2014 with Dr. Excell Seltzerooper.   PMH includes: CAD (s/p CABG 2009), HTN, LBBB, aortic stenosis, heart murmur, carotid stenosis, CKD, glaucoma, dyslipidemia. S/p aortic valve replacement with bioprosthetic valve 2009. S/p C4-5 foraminotomy 2015.   Preoperative labs reviewed.  BUN 25, Cr 1.59. Results appear to be consistent with pt's previous results.   EKG: Sinus rhythm with sinus arrhythmia with occasional PVCs. Possible Left atrial enlargement. Left axis deviation LBBB.   TEE 08/10/2014: - Left ventricle: Wall thickness was increased in a pattern of moderate LVH. Systolic function was normal. The estimated ejection fraction was in the range of 60% to 65%. Wall motion was normal; there were no regional wall motion abnormalities. - Aortic valve: S/P Mitroflow Sorin valve replacement. The leaflets are severely thickened and calcified with almost fixed non-coronary and left coronary cusps and only limited motion of the right coronary cusp. There was severe stenosis. There was moderate regurgitation. Mean gradient (S): 35 mm Hg. Peak gradient (S): 79 mm Hg. - Aorta: There was severe non-mobile atheroma. - Descending aorta: The descending aorta was normal in size. - Mitral valve: Structurally normal valve. There was mild regurgitation. - Left atrium: No evidence of thrombus in the atrial cavity or appendage. No evidence of thrombus in the atrial cavity or appendage. - Right ventricle: Systolic function was normal. - Right atrium: No evidence of thrombus in the atrial cavity or appendage. - Tricuspid valve: There was no significant regurgitation. Impressions: - S/P Mitroflow Sorin aortic valve replacement. The leaflets are severely thickened and calcified with almost fixed non-coronaryand left coronary cusps and only limited motion of the right coronary cusp.There is severe aortic  stenosis (gradients - peak 79 and mean 35mmHg are off axis and therefore underestimated). There is small annulus measuring 18 mm. There is moderate aortic insufficiency.  Cardiac cath 07/03/2014: 1. Severe native three-vessel coronary artery disease 2. Status post multivessel CABG with continued patency of the LIMA to LAD, sequential saphenous vein graft to OM1 and OM 2, and saphenous vein graft to distal RCA 3. Severe bioprosthetic aortic valve stenosis  If no changes, I anticipate pt can proceed with surgery as scheduled.   Rica Mastngela Kabbe, FNP-BC [facility]An[facility]hesiology Phone: [phone] 09/01/2014 4:40 PM

## 2014-09-01 NOTE — Pre-Procedure Instructions (Addendum)
[name]  09/01/2014   Your procedure is scheduled on:  09/05/14  [facility] at 530 AM.  Call this number if you have problems the morning of surgery: [phone]   Remember:   Do not eat food or drink liquids after midnight.   Take these medicines the morning of surgery with A SIP OF WATER:eye drops, nitro if needed   STOP all herbel meds, nsaids (aleve,naproxen,advil,ibuprofen) starting now  Including vitamins,calcium     Do not wear jewelry, make-up or nail polish.  Do not wear lotions, powders, or perfumes. You may wear deodorant.  Do not shave 48 hours prior to surgery. Men may shave face and neck.  Do not bring valuables to the hospital.  [facility] not responsible                  for any belongings or valuables.               Contacts, dentures or bridgework may not be worn into surgery.  Leave suitcase in the car. After surgery it may be brought to your room.  For patients admitted to the hospital, discharge time is determined by your                treatment team.               Patients discharged the day of surgery will not be allowed to drive  home.  Name and phone number of your driver:   Special Instructions:  Special Instructions: [place] - Preparing for Surgery  Before surgery, you can play an important role.  Because skin is not sterile, your skin needs to be as free of germs as possible.  You can reduce the number of germs on you skin by washing with CHG (chlorahexidine gluconate) soap before surgery.  CHG is an antiseptic cleaner which kills germs and bonds with the skin to continue killing germs even after washing.  Please DO NOT use if you have an allergy to CHG or antibacterial soaps.  If your skin becomes reddened/irritated stop using the CHG and inform your nurse when you arrive at Short Stay.  Do not shave (including legs and underarms) for at least 48 hours prior to the first CHG shower.  You may shave your  face.  Please follow these instructions carefully:   1.  Shower with CHG Soap the night before surgery and the morning of Surgery.  2.  If you choose to wash your hair, wash your hair first as usual with your normal shampoo.  3.  After you shampoo, rinse your hair and body thoroughly to remove the Shampoo.  4.  Use CHG as you would any other liquid soap.  You can apply chg directly  to the skin and wash gently with scrungie or a clean washcloth.  5.  Apply the CHG Soap to your body ONLY FROM THE NECK DOWN.  Do not use on open wounds or open sores.  Avoid contact with your eyes ears, mouth and genitals (private parts).  Wash genitals (private parts)       with your normal soap.  6.  Wash thoroughly, paying special attention to the area where your surgery will be performed.  7.  Thoroughly rinse your body with warm water from the neck down.  8.  DO NOT shower/wash with your normal soap after using and rinsing off the CHG Soap.  9.  Pat yourself  dry with a clean towel.            10.  Wear clean pajamas.            11.  Place clean sheets on your bed the night of your first shower and do not sleep with pets.  Day of Surgery  Do not apply any lotions/deodorants the morning of surgery.  Please wear clean clothes to the hospital/surgery center.   Please read over the following fact sheets that you were given: Pain Booklet, Coughing and Deep Breathing, Blood Transfusion Information, Open Heart Packet, MRSA Information and Surgical Site Infection Prevention

## 2014-09-02 LAB — HEMOGLOBIN A1C
Hgb A1c MFr Bld: 5.5 % (ref 4.8–5.6)
MEAN PLASMA GLUCOSE: 111 mg/dL

## 2014-09-04 MED ORDER — VANCOMYCIN HCL 10 G IV SOLR
1250.0000 mg | INTRAVENOUS | Status: AC
  Administered 2014-09-05: 1250 mg via INTRAVENOUS
  Filled 2014-09-04: qty 1250

## 2014-09-04 MED ORDER — DEXTROSE 5 % IV SOLN
30.0000 ug/min | INTRAVENOUS | Status: AC
  Administered 2014-09-05: 100 ug/min via INTRAVENOUS
  Filled 2014-09-04: qty 2

## 2014-09-04 MED ORDER — METOPROLOL TARTRATE 12.5 MG HALF TABLET
12.5000 mg | ORAL_TABLET | Freq: Once | ORAL | Status: AC
  Administered 2014-09-05: 12.5 mg via ORAL
  Filled 2014-09-04: qty 1

## 2014-09-04 MED ORDER — DEXTROSE 5 % IV SOLN
1.5000 g | INTRAVENOUS | Status: AC
  Administered 2014-09-05: 1.5 g via INTRAVENOUS
  Filled 2014-09-04: qty 1.5

## 2014-09-04 MED ORDER — SODIUM CHLORIDE 0.9 % IV SOLN
INTRAVENOUS | Status: DC
  Administered 2014-09-05: 1.3 [IU]/h via INTRAVENOUS
  Filled 2014-09-04: qty 2.5

## 2014-09-04 MED ORDER — SODIUM CHLORIDE 0.9 % IV SOLN
INTRAVENOUS | Status: DC
  Filled 2014-09-04: qty 30

## 2014-09-04 MED ORDER — DEXTROSE 5 % IV SOLN
0.0000 ug/min | INTRAVENOUS | Status: DC
  Filled 2014-09-04: qty 4

## 2014-09-04 MED ORDER — NITROGLYCERIN IN D5W 200-5 MCG/ML-% IV SOLN
2.0000 ug/min | INTRAVENOUS | Status: AC
  Administered 2014-09-05: 5 ug/min via INTRAVENOUS
  Filled 2014-09-04: qty 250

## 2014-09-04 MED ORDER — DEXTROSE 5 % IV SOLN
0.0000 ug/min | INTRAVENOUS | Status: AC
  Administered 2014-09-05: 2 ug/min via INTRAVENOUS
  Filled 2014-09-04: qty 4

## 2014-09-04 MED ORDER — DEXMEDETOMIDINE HCL IN NACL 400 MCG/100ML IV SOLN
0.1000 ug/kg/h | INTRAVENOUS | Status: DC
  Filled 2014-09-04: qty 100

## 2014-09-04 MED ORDER — DOPAMINE-DEXTROSE 3.2-5 MG/ML-% IV SOLN
0.0000 ug/kg/min | INTRAVENOUS | Status: DC
  Filled 2014-09-04: qty 250

## 2014-09-04 MED ORDER — POTASSIUM CHLORIDE 2 MEQ/ML IV SOLN
80.0000 meq | INTRAVENOUS | Status: DC
  Filled 2014-09-04: qty 40

## 2014-09-04 MED ORDER — MAGNESIUM SULFATE 50 % IJ SOLN
40.0000 meq | INTRAMUSCULAR | Status: DC
  Filled 2014-09-04: qty 10

## 2014-09-04 NOTE — Progress Notes (Signed)
Pt stated that the MD's office staff called and told her to arrive at 9:00AM tomorrow.

## 2014-09-05 ENCOUNTER — Inpatient Hospital Stay (HOSPITAL_COMMUNITY): Payer: Medicare HMO | Admitting: Anesthesiology

## 2014-09-05 ENCOUNTER — Inpatient Hospital Stay (HOSPITAL_COMMUNITY): Payer: Medicare HMO | Admitting: Emergency Medicine

## 2014-09-05 ENCOUNTER — Encounter (HOSPITAL_COMMUNITY): Payer: Self-pay | Admitting: *Deleted

## 2014-09-05 ENCOUNTER — Inpatient Hospital Stay (HOSPITAL_COMMUNITY)
Admission: RE | Admit: 2014-09-05 | Discharge: 2014-09-08 | DRG: 267 | Disposition: A | Discharge status: Home / Self Care | Payer: Medicare HMO | Source: Ambulatory Visit | Attending: Thoracic Surgery (Cardiothoracic Vascular Surgery) | Admitting: Thoracic Surgery (Cardiothoracic Vascular Surgery)

## 2014-09-05 ENCOUNTER — Encounter (HOSPITAL_COMMUNITY)
Admission: RE | Disposition: A | Discharge status: Home / Self Care | Payer: Medicare HMO | Source: Ambulatory Visit | Attending: Thoracic Surgery (Cardiothoracic Vascular Surgery)

## 2014-09-05 ENCOUNTER — Inpatient Hospital Stay (HOSPITAL_COMMUNITY): Payer: Medicare HMO

## 2014-09-05 DIAGNOSIS — Z953 Presence of xenogenic heart valve: Secondary | ICD-10-CM

## 2014-09-05 DIAGNOSIS — Z01818 Encounter for other preprocedural examination: Secondary | ICD-10-CM

## 2014-09-05 DIAGNOSIS — N189 Chronic kidney disease, unspecified: Secondary | ICD-10-CM | POA: Diagnosis present

## 2014-09-05 DIAGNOSIS — I25118 Atherosclerotic heart disease of native coronary artery with other forms of angina pectoris: Secondary | ICD-10-CM | POA: Diagnosis present

## 2014-09-05 DIAGNOSIS — H5441 Blindness, right eye, normal vision left eye: Secondary | ICD-10-CM | POA: Diagnosis present

## 2014-09-05 DIAGNOSIS — Z006 Encounter for examination for normal comparison and control in clinical research program: Secondary | ICD-10-CM

## 2014-09-05 DIAGNOSIS — Z79899 Other long term (current) drug therapy: Secondary | ICD-10-CM | POA: Diagnosis not present

## 2014-09-05 DIAGNOSIS — E785 Hyperlipidemia, unspecified: Secondary | ICD-10-CM | POA: Diagnosis present

## 2014-09-05 DIAGNOSIS — Z01812 Encounter for preprocedural laboratory examination: Secondary | ICD-10-CM | POA: Diagnosis not present

## 2014-09-05 DIAGNOSIS — Z951 Presence of aortocoronary bypass graft: Secondary | ICD-10-CM | POA: Diagnosis not present

## 2014-09-05 DIAGNOSIS — Y832 Surgical operation with anastomosis, bypass or graft as the cause of abnormal reaction of the patient, or of later complication, without mention of misadventure at the time of the procedure: Secondary | ICD-10-CM | POA: Diagnosis present

## 2014-09-05 DIAGNOSIS — T82857A Stenosis of cardiac prosthetic devices, implants and grafts, initial encounter: Secondary | ICD-10-CM | POA: Diagnosis not present

## 2014-09-05 DIAGNOSIS — I129 Hypertensive chronic kidney disease with stage 1 through stage 4 chronic kidney disease, or unspecified chronic kidney disease: Secondary | ICD-10-CM | POA: Diagnosis present

## 2014-09-05 DIAGNOSIS — Z8543 Personal history of malignant neoplasm of ovary: Secondary | ICD-10-CM | POA: Diagnosis not present

## 2014-09-05 DIAGNOSIS — Z7982 Long term (current) use of aspirin: Secondary | ICD-10-CM

## 2014-09-05 DIAGNOSIS — H409 Unspecified glaucoma: Secondary | ICD-10-CM | POA: Diagnosis present

## 2014-09-05 DIAGNOSIS — Z01810 Encounter for preprocedural cardiovascular examination: Secondary | ICD-10-CM

## 2014-09-05 DIAGNOSIS — I350 Nonrheumatic aortic (valve) stenosis: Secondary | ICD-10-CM | POA: Diagnosis not present

## 2014-09-05 DIAGNOSIS — Z952 Presence of prosthetic heart valve: Secondary | ICD-10-CM

## 2014-09-05 DIAGNOSIS — I200 Unstable angina: Secondary | ICD-10-CM | POA: Diagnosis present

## 2014-09-05 DIAGNOSIS — D62 Acute posthemorrhagic anemia: Secondary | ICD-10-CM | POA: Diagnosis not present

## 2014-09-05 DIAGNOSIS — R0602 Shortness of breath: Secondary | ICD-10-CM | POA: Diagnosis present

## 2014-09-05 DIAGNOSIS — Z954 Presence of other heart-valve replacement: Secondary | ICD-10-CM

## 2014-09-05 DIAGNOSIS — I10 Essential (primary) hypertension: Secondary | ICD-10-CM | POA: Diagnosis present

## 2014-09-05 DIAGNOSIS — I2510 Atherosclerotic heart disease of native coronary artery without angina pectoris: Secondary | ICD-10-CM | POA: Diagnosis present

## 2014-09-05 DIAGNOSIS — T8209XA Other mechanical complication of heart valve prosthesis, initial encounter: Secondary | ICD-10-CM | POA: Diagnosis present

## 2014-09-05 HISTORY — PX: TEE WITHOUT CARDIOVERSION: SHX5443

## 2014-09-05 HISTORY — PX: TRANSCATHETER AORTIC VALVE REPLACEMENT, TRANSFEMORAL: SHX6400

## 2014-09-05 HISTORY — DX: Presence of prosthetic heart valve: Z95.2

## 2014-09-05 LAB — POCT I-STAT, CHEM 8
BUN: 18 mg/dL (ref 6–23)
BUN: 17 mg/dL (ref 6–23)
BUN: 18 mg/dL (ref 6–23)
BUN: 17 mg/dL (ref 6–23)
CHLORIDE: 109 mmol/L (ref 96–112)
CREATININE: 1 mg/dL (ref 0.50–1.10)
CREATININE: 1 mg/dL (ref 0.50–1.10)
CREATININE: 1.1 mg/dL (ref 0.50–1.10)
Calcium, Ion: 1.3 mmol/L (ref 1.13–1.30)
Calcium, Ion: 1.27 mmol/L (ref 1.13–1.30)
Calcium, Ion: 1.3 mmol/L (ref 1.13–1.30)
Calcium, Ion: 1.21 mmol/L (ref 1.13–1.30)
Chloride: 104 mmol/L (ref 96–112)
Chloride: 106 mmol/L (ref 96–112)
Chloride: 101 mmol/L (ref 96–112)
Creatinine, Ser: 0.9 mg/dL (ref 0.50–1.10)
Glucose, Bld: 102 mg/dL — ABNORMAL HIGH (ref 70–99)
Glucose, Bld: 124 mg/dL — ABNORMAL HIGH (ref 70–99)
Glucose, Bld: 155 mg/dL — ABNORMAL HIGH (ref 70–99)
Glucose, Bld: 110 mg/dL — ABNORMAL HIGH (ref 70–99)
HCT: 29 % — ABNORMAL LOW (ref 36.0–46.0)
HCT: 27 % — ABNORMAL LOW (ref 36.0–46.0)
HEMATOCRIT: 23 % — AB (ref 36.0–46.0)
HEMATOCRIT: 27 % — AB (ref 36.0–46.0)
HEMOGLOBIN: 7.8 g/dL — AB (ref 12.0–15.0)
HEMOGLOBIN: 9.2 g/dL — AB (ref 12.0–15.0)
Hemoglobin: 9.9 g/dL — ABNORMAL LOW (ref 12.0–15.0)
Hemoglobin: 9.2 g/dL — ABNORMAL LOW (ref 12.0–15.0)
POTASSIUM: 3.7 mmol/L (ref 3.5–5.1)
Potassium: 3.4 mmol/L — ABNORMAL LOW (ref 3.5–5.1)
Potassium: 3.6 mmol/L (ref 3.5–5.1)
Potassium: 3.8 mmol/L (ref 3.5–5.1)
SODIUM: 142 mmol/L (ref 135–145)
Sodium: 139 mmol/L (ref 135–145)
Sodium: 140 mmol/L (ref 135–145)
Sodium: 137 mmol/L (ref 135–145)
TCO2: 21 mmol/L (ref 0–100)
TCO2: 22 mmol/L (ref 0–100)
TCO2: 23 mmol/L (ref 0–100)
TCO2: 23 mmol/L (ref 0–100)

## 2014-09-05 LAB — POCT I-STAT 3, ART BLOOD GAS (G3+)
Acid-base deficit: 4 mmol/L — ABNORMAL HIGH (ref 0.0–2.0)
BICARBONATE: 24.8 meq/L — AB (ref 20.0–24.0)
BICARBONATE: 25.9 meq/L — AB (ref 20.0–24.0)
BICARBONATE: 26.1 meq/L — AB (ref 20.0–24.0)
O2 Saturation: 100 %
O2 Saturation: 94 %
O2 Saturation: 99 %
PCO2 ART: 41 mmHg (ref 35.0–45.0)
PH ART: 7.193 — AB (ref 7.350–7.450)
TCO2: 26 mmol/L (ref 0–100)
TCO2: 28 mmol/L (ref 0–100)
TCO2: 28 mmol/L (ref 0–100)
pCO2 arterial: 66 mmHg (ref 35.0–45.0)
pCO2 arterial: 49.2 mmHg — ABNORMAL HIGH (ref 35.0–45.0)
pH, Arterial: 7.39 (ref 7.350–7.450)
pH, Arterial: 7.328 — ABNORMAL LOW (ref 7.350–7.450)
pO2, Arterial: 476 mmHg — ABNORMAL HIGH (ref 80.0–100.0)
pO2, Arterial: 86 mmHg (ref 80.0–100.0)
pO2, Arterial: 143 mmHg — ABNORMAL HIGH (ref 80.0–100.0)

## 2014-09-05 LAB — MAGNESIUM: Magnesium: 1.6 mg/dL (ref 1.5–2.5)

## 2014-09-05 LAB — URINALYSIS, ROUTINE W REFLEX MICROSCOPIC
Bilirubin Urine: NEGATIVE
Glucose, UA: NEGATIVE mg/dL
Hgb urine dipstick: NEGATIVE
KETONES UR: NEGATIVE mg/dL
Nitrite: NEGATIVE
Protein, ur: NEGATIVE mg/dL
SPECIFIC GRAVITY, URINE: 1.019 (ref 1.005–1.030)
Urobilinogen, UA: 0.2 mg/dL (ref 0.0–1.0)
pH: 6 (ref 5.0–8.0)

## 2014-09-05 LAB — URINE MICROSCOPIC-ADD ON

## 2014-09-05 LAB — POCT I-STAT 4, (NA,K, GLUC, HGB,HCT)
Glucose, Bld: 187 mg/dL — ABNORMAL HIGH (ref 70–99)
HCT: 27 % — ABNORMAL LOW (ref 36.0–46.0)
HEMOGLOBIN: 9.2 g/dL — AB (ref 12.0–15.0)
Potassium: 3.7 mmol/L (ref 3.5–5.1)
SODIUM: 139 mmol/L (ref 135–145)

## 2014-09-05 LAB — CREATININE, SERUM
Creatinine, Ser: 1.21 mg/dL — ABNORMAL HIGH (ref 0.50–1.10)
GFR calc Af Amer: 48 mL/min — ABNORMAL LOW (ref >90)
GFR calc non Af Amer: 41 mL/min — ABNORMAL LOW (ref >90)

## 2014-09-05 LAB — CBC
HCT: 28.4 % — ABNORMAL LOW (ref 36.0–46.0)
HEMOGLOBIN: 9.5 g/dL — AB (ref 12.0–15.0)
MCH: 28.6 pg (ref 26.0–34.0)
MCHC: 33.5 g/dL (ref 30.0–36.0)
MCV: 85.5 fL (ref 78.0–100.0)
PLATELETS: 142 10*3/uL — AB (ref 150–400)
RBC: 3.32 MIL/uL — ABNORMAL LOW (ref 3.87–5.11)
RDW: 14.3 % (ref 11.5–15.5)
WBC: 10.3 10*3/uL (ref 4.0–10.5)

## 2014-09-05 LAB — PROTIME-INR
INR: 1.23 (ref 0.00–1.49)
Prothrombin Time: 15.6 seconds — ABNORMAL HIGH (ref 11.6–15.2)

## 2014-09-05 LAB — APTT: APTT: 31 s (ref 24–37)

## 2014-09-05 LAB — PREPARE RBC (CROSSMATCH)

## 2014-09-05 SURGERY — IMPLANTATION, AORTIC VALVE, TRANSCATHETER, FEMORAL APPROACH
Anesthesia: General | Site: Chest

## 2014-09-05 MED ORDER — METOPROLOL TARTRATE 12.5 MG HALF TABLET
12.5000 mg | ORAL_TABLET | Freq: Two times a day (BID) | ORAL | Status: DC
  Filled 2014-09-05 (×3): qty 1

## 2014-09-05 MED ORDER — MIDAZOLAM HCL 2 MG/2ML IJ SOLN
INTRAMUSCULAR | Status: AC
  Filled 2014-09-05: qty 2

## 2014-09-05 MED ORDER — SODIUM CHLORIDE 0.9 % IV SOLN: 250.0000 mL | INTRAVENOUS | Status: DC | PRN

## 2014-09-05 MED ORDER — SODIUM CHLORIDE 0.9 % IV SOLN
1.0000 mL/kg/h | INTRAVENOUS | Status: DC
  Administered 2014-09-05: 1 mL/kg/h via INTRAVENOUS

## 2014-09-05 MED ORDER — IODIXANOL 320 MG/ML IV SOLN
INTRAVENOUS | Status: DC | PRN
  Administered 2014-09-05: 30 mL via INTRAVENOUS

## 2014-09-05 MED ORDER — VANCOMYCIN HCL IN DEXTROSE 1-5 GM/200ML-% IV SOLN
1000.0000 mg | Freq: Once | INTRAVENOUS | Status: AC
  Administered 2014-09-06: 1000 mg via INTRAVENOUS
  Filled 2014-09-05: qty 200

## 2014-09-05 MED ORDER — ACETAMINOPHEN 160 MG/5ML PO SOLN: 1000.0000 mg | Freq: Four times a day (QID) | ORAL | Status: DC

## 2014-09-05 MED ORDER — SUCCINYLCHOLINE CHLORIDE 20 MG/ML IJ SOLN
INTRAMUSCULAR | Status: AC
  Filled 2014-09-05: qty 1

## 2014-09-05 MED ORDER — PROTAMINE SULFATE 10 MG/ML IV SOLN
INTRAVENOUS | Status: DC | PRN
  Administered 2014-09-05 (×2): 10 mg via INTRAVENOUS
  Administered 2014-09-05: 20 mg via INTRAVENOUS
  Administered 2014-09-05: 10 mg via INTRAVENOUS
  Administered 2014-09-05: 20 mg via INTRAVENOUS

## 2014-09-05 MED ORDER — CHLORHEXIDINE GLUCONATE CLOTH 2 % EX PADS
6.0000 | MEDICATED_PAD | Freq: Every day | CUTANEOUS | Status: DC
  Administered 2014-09-05: 6 via TOPICAL

## 2014-09-05 MED ORDER — MUPIROCIN 2 % EX OINT
1.0000 "application " | TOPICAL_OINTMENT | Freq: Two times a day (BID) | CUTANEOUS | Status: DC
  Administered 2014-09-05 – 2014-09-08 (×6): 1 via NASAL
  Filled 2014-09-05 (×2): qty 22

## 2014-09-05 MED ORDER — CHLORHEXIDINE GLUCONATE CLOTH 2 % EX PADS: 6.0000 | MEDICATED_PAD | Freq: Every day | CUTANEOUS | Status: DC

## 2014-09-05 MED ORDER — ROCURONIUM BROMIDE 100 MG/10ML IV SOLN
INTRAVENOUS | Status: DC | PRN
  Administered 2014-09-05: 50 mg via INTRAVENOUS

## 2014-09-05 MED ORDER — NEOSTIGMINE METHYLSULFATE 10 MG/10ML IV SOLN
INTRAVENOUS | Status: DC | PRN
  Administered 2014-09-05: 4 mg via INTRAVENOUS

## 2014-09-05 MED ORDER — PROPOFOL 10 MG/ML IV BOLUS
INTRAVENOUS | Status: DC | PRN
  Administered 2014-09-05 (×2): 30 mg via INTRAVENOUS
  Administered 2014-09-05: 80 mg via INTRAVENOUS
  Administered 2014-09-05: 30 mg via INTRAVENOUS

## 2014-09-05 MED ORDER — POTASSIUM CHLORIDE 10 MEQ/50ML IV SOLN
10.0000 meq | INTRAVENOUS | Status: AC
  Administered 2014-09-05 (×3): 10 meq via INTRAVENOUS
  Filled 2014-09-05: qty 50

## 2014-09-05 MED ORDER — ASPIRIN EC 81 MG PO TBEC
81.0000 mg | DELAYED_RELEASE_TABLET | Freq: Every day | ORAL | Status: DC
Start: 2014-09-05 — End: 2014-09-06
  Administered 2014-09-05 – 2014-09-06 (×2): 81 mg via ORAL
  Filled 2014-09-05 (×2): qty 1

## 2014-09-05 MED ORDER — FUROSEMIDE 10 MG/ML IJ SOLN
40.0000 mg | Freq: Once | INTRAMUSCULAR | Status: AC
  Administered 2014-09-05: 40 mg via INTRAVENOUS
  Filled 2014-09-05: qty 4

## 2014-09-05 MED ORDER — SODIUM CHLORIDE 0.9 % IJ SOLN
3.0000 mL | INTRAMUSCULAR | Status: DC | PRN
  Administered 2014-09-07: 3 mL via INTRAVENOUS
  Filled 2014-09-05: qty 3

## 2014-09-05 MED ORDER — CHLORHEXIDINE GLUCONATE 4 % EX LIQD
60.0000 mL | Freq: Once | CUTANEOUS | Status: DC
  Filled 2014-09-05: qty 60

## 2014-09-05 MED ORDER — SODIUM BICARBONATE 8.4 % IV SOLN
Freq: Once | INTRAVENOUS | Status: AC
  Administered 2014-09-05: 225 mL/h via INTRAVENOUS
  Filled 2014-09-05: qty 1000

## 2014-09-05 MED ORDER — LACTATED RINGERS IV SOLN
INTRAVENOUS | Status: DC
  Administered 2014-09-05 (×2): via INTRAVENOUS

## 2014-09-05 MED ORDER — LACTATED RINGERS IV SOLN: 500.0000 mL | Freq: Once | INTRAVENOUS | Status: AC | PRN

## 2014-09-05 MED ORDER — LIDOCAINE HCL (CARDIAC) 20 MG/ML IV SOLN
INTRAVENOUS | Status: DC | PRN
  Administered 2014-09-05: 50 mg via INTRAVENOUS

## 2014-09-05 MED ORDER — GLYCOPYRROLATE 0.2 MG/ML IJ SOLN
INTRAMUSCULAR | Status: AC
  Filled 2014-09-05: qty 3

## 2014-09-05 MED ORDER — FAMOTIDINE IN NACL 20-0.9 MG/50ML-% IV SOLN
20.0000 mg | Freq: Two times a day (BID) | INTRAVENOUS | Status: AC
  Administered 2014-09-05 – 2014-09-06 (×2): 20 mg via INTRAVENOUS
  Filled 2014-09-05 (×2): qty 50

## 2014-09-05 MED ORDER — NITROGLYCERIN IN D5W 200-5 MCG/ML-% IV SOLN
0.0000 ug/min | INTRAVENOUS | Status: DC
  Administered 2014-09-06: 60 ug/min via INTRAVENOUS
  Filled 2014-09-05 (×2): qty 250

## 2014-09-05 MED ORDER — ACETAMINOPHEN 500 MG PO TABS
1000.0000 mg | ORAL_TABLET | Freq: Four times a day (QID) | ORAL | Status: DC
  Administered 2014-09-06 – 2014-09-08 (×5): 1000 mg via ORAL
  Filled 2014-09-05 (×12): qty 2

## 2014-09-05 MED ORDER — METOPROLOL TARTRATE 25 MG/10 ML ORAL SUSPENSION: 12.5000 mg | Freq: Two times a day (BID) | ORAL | Status: DC

## 2014-09-05 MED ORDER — MORPHINE SULFATE 2 MG/ML IJ SOLN
1.0000 mg | INTRAMUSCULAR | Status: DC | PRN
  Administered 2014-09-06: 1 mg via INTRAVENOUS
  Filled 2014-09-05: qty 1

## 2014-09-05 MED ORDER — ZOLPIDEM TARTRATE 5 MG PO TABS: 5.0000 mg | ORAL_TABLET | Freq: Every evening | ORAL | Status: DC | PRN

## 2014-09-05 MED ORDER — ALBUMIN HUMAN 5 % IV SOLN
INTRAVENOUS | Status: DC | PRN
  Administered 2014-09-05: 15:00:00 via INTRAVENOUS

## 2014-09-05 MED ORDER — TRAMADOL HCL 50 MG PO TABS: 50.0000 mg | ORAL_TABLET | ORAL | Status: DC | PRN

## 2014-09-05 MED ORDER — FENTANYL CITRATE 0.05 MG/ML IJ SOLN
INTRAMUSCULAR | Status: AC
  Filled 2014-09-05: qty 5

## 2014-09-05 MED ORDER — PROPOFOL 10 MG/ML IV BOLUS
INTRAVENOUS | Status: AC
  Filled 2014-09-05: qty 20

## 2014-09-05 MED ORDER — DEXTROSE 5 % IV SOLN
1.5000 g | Freq: Two times a day (BID) | INTRAVENOUS | Status: AC
  Administered 2014-09-05 – 2014-09-07 (×4): 1.5 g via INTRAVENOUS
  Filled 2014-09-05 (×4): qty 1.5

## 2014-09-05 MED ORDER — SODIUM CHLORIDE 0.9 % IJ SOLN
3.0000 mL | Freq: Two times a day (BID) | INTRAMUSCULAR | Status: DC
  Administered 2014-09-06: 3 mL via INTRAVENOUS

## 2014-09-05 MED ORDER — CLOPIDOGREL BISULFATE 75 MG PO TABS
75.0000 mg | ORAL_TABLET | Freq: Every day | ORAL | Status: DC
  Administered 2014-09-06 – 2014-09-08 (×3): 75 mg via ORAL
  Filled 2014-09-05 (×4): qty 1

## 2014-09-05 MED ORDER — GLYCOPYRROLATE 0.2 MG/ML IJ SOLN
INTRAMUSCULAR | Status: DC | PRN
  Administered 2014-09-05: 0.6 mg via INTRAVENOUS

## 2014-09-05 MED ORDER — ROCURONIUM BROMIDE 50 MG/5ML IV SOLN
INTRAVENOUS | Status: AC
  Filled 2014-09-05: qty 1

## 2014-09-05 MED ORDER — CHLORHEXIDINE GLUCONATE 4 % EX LIQD
30.0000 mL | CUTANEOUS | Status: DC
  Filled 2014-09-05: qty 30

## 2014-09-05 MED ORDER — ONDANSETRON HCL 4 MG/2ML IJ SOLN
4.0000 mg | Freq: Four times a day (QID) | INTRAMUSCULAR | Status: DC | PRN
  Administered 2014-09-05 – 2014-09-06 (×2): 4 mg via INTRAVENOUS
  Filled 2014-09-05 (×2): qty 2

## 2014-09-05 MED ORDER — SODIUM CHLORIDE 0.9 % IR SOLN
Status: DC | PRN
  Administered 2014-09-05: 500 mL

## 2014-09-05 MED ORDER — ASPIRIN 81 MG PO TABS: 81.0000 mg | ORAL_TABLET | Freq: Every day | ORAL | Status: DC

## 2014-09-05 MED ORDER — METOPROLOL TARTRATE 1 MG/ML IV SOLN
2.5000 mg | INTRAVENOUS | Status: DC | PRN
Start: 2014-09-05 — End: 2014-09-06
  Administered 2014-09-06: 5 mg via INTRAVENOUS
  Filled 2014-09-05: qty 5

## 2014-09-05 MED ORDER — ARTIFICIAL TEARS OP OINT
TOPICAL_OINTMENT | OPHTHALMIC | Status: DC | PRN
  Administered 2014-09-05: 1 via OPHTHALMIC

## 2014-09-05 MED ORDER — NEOSTIGMINE METHYLSULFATE 10 MG/10ML IV SOLN
INTRAVENOUS | Status: AC
  Filled 2014-09-05: qty 1

## 2014-09-05 MED ORDER — ATORVASTATIN CALCIUM 80 MG PO TABS
80.0000 mg | ORAL_TABLET | Freq: Every day | ORAL | Status: DC
  Administered 2014-09-05: 80 mg via ORAL
  Filled 2014-09-05 (×2): qty 1

## 2014-09-05 MED ORDER — MORPHINE SULFATE 2 MG/ML IJ SOLN: 2.0000 mg | INTRAMUSCULAR | Status: DC | PRN

## 2014-09-05 MED ORDER — ONDANSETRON HCL 4 MG/2ML IJ SOLN
INTRAMUSCULAR | Status: DC | PRN
  Administered 2014-09-05: 4 mg via INTRAVENOUS

## 2014-09-05 MED ORDER — ACETAMINOPHEN 160 MG/5ML PO SOLN: 650.0000 mg | Freq: Once | ORAL | Status: DC

## 2014-09-05 MED ORDER — LABETALOL HCL 5 MG/ML IV SOLN
INTRAVENOUS | Status: AC
  Administered 2014-09-05: 10 mg
  Filled 2014-09-05: qty 4

## 2014-09-05 MED ORDER — INSULIN REGULAR BOLUS VIA INFUSION
0.0000 [IU] | Freq: Three times a day (TID) | INTRAVENOUS | Status: DC
  Filled 2014-09-05: qty 10

## 2014-09-05 MED ORDER — PROTAMINE SULFATE 10 MG/ML IV SOLN
INTRAVENOUS | Status: AC
  Filled 2014-09-05: qty 25

## 2014-09-05 MED ORDER — HEPARIN SODIUM (PORCINE) 1000 UNIT/ML IJ SOLN
INTRAMUSCULAR | Status: AC
  Filled 2014-09-05: qty 1

## 2014-09-05 MED ORDER — ALBUMIN HUMAN 5 % IV SOLN
250.0000 mL | INTRAVENOUS | Status: AC | PRN
  Administered 2014-09-05 (×2): 250 mL via INTRAVENOUS
  Filled 2014-09-05 (×2): qty 250

## 2014-09-05 MED ORDER — LABETALOL HCL 5 MG/ML IV SOLN
10.0000 mg | INTRAVENOUS | Status: DC | PRN
  Administered 2014-09-06 (×3): 10 mg via INTRAVENOUS
  Filled 2014-09-05 (×3): qty 4

## 2014-09-05 MED ORDER — MIDAZOLAM HCL 5 MG/5ML IJ SOLN
INTRAMUSCULAR | Status: DC | PRN
  Administered 2014-09-05: 0.5 mg via INTRAVENOUS
  Administered 2014-09-05 (×2): 1 mg via INTRAVENOUS

## 2014-09-05 MED ORDER — STERILE WATER FOR INJECTION IJ SOLN
INTRAMUSCULAR | Status: AC
  Filled 2014-09-05: qty 10

## 2014-09-05 MED ORDER — FENTANYL CITRATE 0.05 MG/ML IJ SOLN
INTRAMUSCULAR | Status: AC
  Filled 2014-09-05: qty 2

## 2014-09-05 MED ORDER — SODIUM BICARBONATE BOLUS VIA INFUSION
Freq: Once | INTRAVENOUS | Status: AC
  Administered 2014-09-05: 18:00:00 via INTRAVENOUS
  Filled 2014-09-05: qty 1

## 2014-09-05 MED ORDER — VECURONIUM BROMIDE 10 MG IV SOLR
INTRAVENOUS | Status: DC | PRN
  Administered 2014-09-05: 1 mg via INTRAVENOUS

## 2014-09-05 MED ORDER — PHENYLEPHRINE 40 MCG/ML (10ML) SYRINGE FOR IV PUSH (FOR BLOOD PRESSURE SUPPORT)
PREFILLED_SYRINGE | INTRAVENOUS | Status: AC
  Filled 2014-09-05: qty 10

## 2014-09-05 MED ORDER — DEXTROSE 5 % IV SOLN
0.0000 ug/min | INTRAVENOUS | Status: DC
  Filled 2014-09-05: qty 2

## 2014-09-05 MED ORDER — PANTOPRAZOLE SODIUM 40 MG PO TBEC
40.0000 mg | DELAYED_RELEASE_TABLET | Freq: Every day | ORAL | Status: DC
  Administered 2014-09-07 – 2014-09-08 (×2): 40 mg via ORAL
  Filled 2014-09-05 (×2): qty 1

## 2014-09-05 MED ORDER — INSULIN REGULAR HUMAN 100 UNIT/ML IJ SOLN
INTRAMUSCULAR | Status: DC
  Filled 2014-09-05: qty 2.5

## 2014-09-05 MED ORDER — ACETAMINOPHEN 650 MG RE SUPP: 650.0000 mg | Freq: Once | RECTAL | Status: DC

## 2014-09-05 MED ORDER — LABETALOL HCL 5 MG/ML IV SOLN
INTRAVENOUS | Status: DC | PRN
  Administered 2014-09-05: 10 mg via INTRAVENOUS

## 2014-09-05 MED ORDER — FENTANYL CITRATE 0.05 MG/ML IJ SOLN
INTRAMUSCULAR | Status: DC | PRN
  Administered 2014-09-05: 50 ug via INTRAVENOUS
  Administered 2014-09-05: 150 ug via INTRAVENOUS
  Administered 2014-09-05: 50 ug via INTRAVENOUS
  Administered 2014-09-05: 100 ug via INTRAVENOUS

## 2014-09-05 MED ORDER — HEPARIN SODIUM (PORCINE) 1000 UNIT/ML IJ SOLN
INTRAMUSCULAR | Status: DC | PRN
  Administered 2014-09-05: 7000 [IU] via INTRAVENOUS

## 2014-09-05 MED ORDER — MIDAZOLAM HCL 2 MG/2ML IJ SOLN
2.0000 mg | INTRAMUSCULAR | Status: DC | PRN
Start: 2014-09-05 — End: 2014-09-05

## 2014-09-05 MED ORDER — OXYCODONE HCL 5 MG PO TABS: 5.0000 mg | ORAL_TABLET | ORAL | Status: DC | PRN

## 2014-09-05 MED ORDER — TIMOLOL MALEATE 0.5 % OP SOLN
1.0000 [drp] | Freq: Two times a day (BID) | OPHTHALMIC | Status: DC
  Administered 2014-09-05 – 2014-09-08 (×6): 1 [drp] via OPHTHALMIC
  Filled 2014-09-05: qty 5

## 2014-09-05 MED ORDER — 0.9 % SODIUM CHLORIDE (POUR BTL) OPTIME
TOPICAL | Status: DC | PRN
  Administered 2014-09-05: 3000 mL

## 2014-09-05 MED ORDER — VECURONIUM BROMIDE 10 MG IV SOLR
INTRAVENOUS | Status: AC
  Filled 2014-09-05: qty 10

## 2014-09-05 MED ORDER — DEXMEDETOMIDINE HCL IN NACL 200 MCG/50ML IV SOLN: 0.1000 ug/kg/h | INTRAVENOUS | Status: DC

## 2014-09-05 SURGICAL SUPPLY — 88 items
ADH SKN CLS APL DERMABOND .7 (GAUZE/BANDAGES/DRESSINGS) ×2
ATTRACTOMAT 16X20 MAGNETIC DRP (DRAPES) IMPLANT
BAG BANDED W/RUBBER/TAPE 36X54 (MISCELLANEOUS) ×4 IMPLANT
BAG DECANTER FOR FLEXI CONT (MISCELLANEOUS) IMPLANT
BAG EQP BAND 135X91 W/RBR TAPE (MISCELLANEOUS) ×4
BAG SNAP BAND KOVER 36X36 (MISCELLANEOUS) ×3 IMPLANT
BLADE 10 SAFETY STRL DISP (BLADE) ×3 IMPLANT
BLADE STERNUM SYSTEM 6 (BLADE) ×3 IMPLANT
BLADE SURG ROTATE 9660 (MISCELLANEOUS) IMPLANT
CABLE PACING FASLOC BIEGE (MISCELLANEOUS) IMPLANT
CABLE PACING FASLOC BLUE (MISCELLANEOUS) ×3 IMPLANT
CANISTER SUCTION 2500CC (MISCELLANEOUS) IMPLANT
CANNULA FEM VENOUS REMOTE 22FR (CANNULA) IMPLANT
CANNULA OPTISITE PERFUSION 16F (CANNULA) IMPLANT
CANNULA OPTISITE PERFUSION 18F (CANNULA) IMPLANT
CATH EXPO 5FR AL1 (CATHETERS) ×1 IMPLANT
CATH S G BIP PACING (SET/KITS/TRAYS/PACK) ×3 IMPLANT
CLIP TI MEDIUM 24 (CLIP) ×3 IMPLANT
CLIP TI WIDE RED SMALL 24 (CLIP) ×3 IMPLANT
CONT SPEC 4OZ CLIKSEAL STRL BL (MISCELLANEOUS) ×3 IMPLANT
COVER DOME SNAP 22 D (MISCELLANEOUS) ×6 IMPLANT
COVER MAYO STAND STRL (DRAPES) ×3 IMPLANT
COVER TABLE BACK 60X90 (DRAPES) ×3 IMPLANT
CRADLE DONUT ADULT HEAD (MISCELLANEOUS) ×3 IMPLANT
DERMABOND ADVANCED (GAUZE/BANDAGES/DRESSINGS) ×1
DERMABOND ADVANCED .7 DNX12 (GAUZE/BANDAGES/DRESSINGS) ×2 IMPLANT
DRAPE INCISE IOBAN 66X45 STRL (DRAPES) IMPLANT
DRAPE SLUSH/WARMER DISC (DRAPES) ×3 IMPLANT
DRAPE TABLE COVER HEAVY DUTY (DRAPES) ×3 IMPLANT
DRSG TEGADERM 4X4.75 (GAUZE/BANDAGES/DRESSINGS) ×3 IMPLANT
ELECT REM PT RETURN 9FT ADLT (ELECTROSURGICAL) ×6
ELECTRODE REM PT RTRN 9FT ADLT (ELECTROSURGICAL) ×4 IMPLANT
FELT TEFLON 6X6 (MISCELLANEOUS) ×3 IMPLANT
FEMORAL VENOUS CANN RAP (CANNULA) IMPLANT
GAUZE SPONGE 4X4 12PLY STRL (GAUZE/BANDAGES/DRESSINGS) ×3 IMPLANT
GLOVE BIOGEL PI IND STRL 6.5 (GLOVE) IMPLANT
GLOVE BIOGEL PI INDICATOR 6.5 (GLOVE) ×1
GLOVE ECLIPSE 7.5 STRL STRAW (GLOVE) ×3 IMPLANT
GLOVE ECLIPSE 8.0 STRL XLNG CF (GLOVE) ×8 IMPLANT
GLOVE EUDERMIC 7 POWDERFREE (GLOVE) ×3 IMPLANT
GLOVE ORTHO TXT STRL SZ7.5 (GLOVE) ×3 IMPLANT
GOWN STRL REUS W/ TWL LRG LVL3 (GOWN DISPOSABLE) ×6 IMPLANT
GOWN STRL REUS W/ TWL XL LVL3 (GOWN DISPOSABLE) ×12 IMPLANT
GOWN STRL REUS W/TWL LRG LVL3 (GOWN DISPOSABLE) ×9
GOWN STRL REUS W/TWL XL LVL3 (GOWN DISPOSABLE) ×18
GUIDEWIRE SAF TJ AMPL .035X180 (WIRE) ×3 IMPLANT
INSERT FOGARTY 61MM (MISCELLANEOUS) ×3 IMPLANT
INSERT FOGARTY XLG (MISCELLANEOUS) IMPLANT
KIT BASIN OR (CUSTOM PROCEDURE TRAY) ×3 IMPLANT
KIT DILATOR VASC 18G NDL (KITS) IMPLANT
KIT HEART LEFT (KITS) ×1 IMPLANT
KIT ROOM TURNOVER OR (KITS) ×3 IMPLANT
KIT SUCTION CATH 14FR (SUCTIONS) ×6 IMPLANT
LIQUID BAND (GAUZE/BANDAGES/DRESSINGS) ×1 IMPLANT
NDL PERC 18GX7CM (NEEDLE) ×2 IMPLANT
NEEDLE PERC 18GX7CM (NEEDLE) ×3 IMPLANT
NS IRRIG 1000ML POUR BTL (IV SOLUTION) ×9 IMPLANT
PACK AORTA (CUSTOM PROCEDURE TRAY) ×3 IMPLANT
PAD ARMBOARD 7.5X6 YLW CONV (MISCELLANEOUS) ×6 IMPLANT
PAD ELECT DEFIB RADIOL ZOLL (MISCELLANEOUS) ×3 IMPLANT
PATCH TACHOSII LRG 9.5X4.8 (VASCULAR PRODUCTS) IMPLANT
SPONGE GAUZE 4X4 12PLY STER LF (GAUZE/BANDAGES/DRESSINGS) ×1 IMPLANT
SPONGE LAP 4X18 X RAY DECT (DISPOSABLE) ×3 IMPLANT
STOPCOCK MORSE 400PSI 3WAY (MISCELLANEOUS) ×5 IMPLANT
SUT ETHIBOND X763 2 0 SH 1 (SUTURE) ×3 IMPLANT
SUT GORETEX CV 4 TH 22 36 (SUTURE) ×3 IMPLANT
SUT GORETEX TH-18 36 INCH (SUTURE) ×6 IMPLANT
SUT MNCRL AB 3-0 PS2 18 (SUTURE) ×3 IMPLANT
SUT PROLENE 3 0 SH1 36 (SUTURE) IMPLANT
SUT PROLENE 4 0 RB 1 (SUTURE) ×3
SUT PROLENE 4-0 RB1 .5 CRCL 36 (SUTURE) ×2 IMPLANT
SUT PROLENE 5 0 C 1 36 (SUTURE) ×7 IMPLANT
SUT PROLENE 5 0 C1 (SUTURE) ×1 IMPLANT
SUT PROLENE 6 0 C 1 30 (SUTURE) ×6 IMPLANT
SUT SILK  1 MH (SUTURE) ×1
SUT SILK 1 MH (SUTURE) ×2 IMPLANT
SUT SILK 2 0 SH CR/8 (SUTURE) IMPLANT
SUT VIC AB 2-0 CTX 36 (SUTURE) IMPLANT
SUT VIC AB 3-0 SH 8-18 (SUTURE) ×6 IMPLANT
SYR 30ML LL (SYRINGE) ×6 IMPLANT
SYR 50ML LL SCALE MARK (SYRINGE) ×9 IMPLANT
TAPE CLOTH SURG 4X10 WHT LF (GAUZE/BANDAGES/DRESSINGS) ×1 IMPLANT
TOWEL OR 17X26 10 PK STRL BLUE (TOWEL DISPOSABLE) ×6 IMPLANT
TRANSCATH VALVE HEART SZ3 20 (Valve) ×3 IMPLANT
TRAY FOLEY IC TEMP SENS 14FR (CATHETERS) ×3 IMPLANT
TUBE SUCT INTRACARD DLP 20F (MISCELLANEOUS) IMPLANT
TUBING HIGH PRESSURE 120CM (CONNECTOR) ×3 IMPLANT
VALVE TRANSCATH HEART SZ3 20 (Valve) IMPLANT

## 2014-09-05 NOTE — Anesthesia Procedure Notes (Addendum)
Procedure Name: Intubation Date/Time: 09/05/2014 2:37 PM Performed by: Jefm MilesENNIE, Breshae Belcher E Pre-anesthesia Checklist: Patient identified, Emergency Drugs available, Suction available, Patient being monitored and Timeout performed Patient Re-evaluated:Patient Re-evaluated prior to inductionOxygen Delivery Method: Circle system utilized Preoxygenation: Pre-oxygenation with 100% oxygen Intubation Type: IV induction Ventilation: Mask ventilation without difficulty Laryngoscope Size: Mac and 3 Grade View: Grade I Tube type: Oral Tube size: 7.5 mm Number of attempts: 1 Airway Equipment and Method: Stylet Placement Confirmation: ETT inserted through vocal cords under direct vision,  positive ETCO2 and breath sounds checked- equal and bilateral Secured at: 22 cm Tube secured with: Tape Dental Injury: Teeth and Oropharynx as per pre-operative assessment

## 2014-09-05 NOTE — Progress Notes (Signed)
Nurse asked if I would take pt off of BIPAP. Pt was placed on 6 lpm. SAT 100%. No distress noted.

## 2014-09-05 NOTE — Interval H&P Note (Signed)
History and Physical Interval Note:  09/05/2014 10:44 AM  [name]  has presented today for surgery, with the diagnosis of SEVERE AORTIC STENOSIS  The various methods of treatment have been discussed with the patient and family. After consideration of risks, benefits and other options for treatment, the patient has consented to  Procedure(s): TRANSCATHETER AORTIC VALVE REPLACEMENT, TRANSFEMORAL (N/A) TRANSESOPHAGEAL ECHOCARDIOGRAM (TEE) (N/A) as a surgical intervention .  The patient's history has been reviewed, patient examined, no change in status, stable for surgery.  I have reviewed the patient's chart and labs.  Questions were answered to the patient's satisfaction.     Nadezhda Pollitt H

## 2014-09-05 NOTE — Interval H&P Note (Signed)
History and Physical Interval Note:  09/05/2014 10:44 AM  [name]  has presented today for surgery, with the diagnosis of SEVERE AORTIC STENOSIS  The various methods of treatment have been discussed with the patient and family. After consideration of risks, benefits and other options for treatment, the patient has consented to  Procedure(s): TRANSCATHETER AORTIC VALVE REPLACEMENT, TRANSFEMORAL (N/A) TRANSESOPHAGEAL ECHOCARDIOGRAM (TEE) (N/A) as a surgical intervention .  The patient's history has been reviewed, patient examined, no change in status, stable for surgery.  I have reviewed the patient's chart and labs.  Questions were answered to the patient's satisfaction.     Tonny BollmanMichael Milagro Belmares

## 2014-09-05 NOTE — Anesthesia Postprocedure Evaluation (Signed)
  Anesthesia Post-op Note  Patient: [name]  Procedure(s) Performed: Procedure(s): TRANSCATHETER AORTIC VALVE REPLACEMENT, TRANSFEMORAL (N/A) TRANSESOPHAGEAL ECHOCARDIOGRAM (TEE) (N/A)  Patient Location: ICU  Anesthesia Type:General  Level of Consciousness: awake and oriented  Airway and Oxygen Therapy: Patient Spontanous Breathing and Patient connected to nasal cannula oxygen  Post-op Pain: none  Post-op Assessment: Post-op Vital signs reviewed, Patient's Cardiovascular Status Stable and Respiratory Function Stable  Post-op Vital Signs: Reviewed and stable  Last Vitals:  Filed Vitals:   09/05/14 1544  BP:   Pulse: 50  Temp:   Resp:     Complications: No apparent anesthesia complications

## 2014-09-05 NOTE — Op Note (Addendum)
CARDIOTHORACIC SURGERY OPERATIVE NOTE  Date of Procedure:  09/05/2014  Preoperative Diagnosis: Prosthetic Valve Dysfunction with Severe Aortic Stenosis   Postoperative Diagnosis: Same   Procedure:    Transcatheter Aortic Valve Replacement - Valve-in-Valve - Open Right Transfemoral Approach   Edwards Sapien 3 Transcatheter Heart Valve (size 20 mm, model # 9600TFX, serial # [id number])   Co-Surgeons:  Salvatore Decentlarence H. Cornelius Moraswen, MD and Tonny BollmanMichael Cooper, MD  Assistants:   Verne Carrowhristopher McAlhany, MD  Anesthesiologist:  Arta BruceKevin Ossey, MD  Echocardiographer:  Tobias AlexanderKatarina Nelson, MD  Pre-operative Echo Findings:  Prosthetic valve dysfunction severe aortic stenosis  Normal left ventricular systolic function  Post-operative Echo Findings:  Trivial paravalvular leak  Normal left ventricular systolic function      DETAILS OF THE OPERATIVE PROCEDURE  The majority of the procedure is documented separately in a procedure note by Dr. Excell Seltzerooper.   TRANSFEMORAL ACCESS:   A small incision is made in the right groin immediately over the common femoral artery. The subcutaneous tissues are divided with electrocautery and the anterior surface of the common femoral artery is identified. Sharp dissection is utilized to free up the artery proximally and distally and the vessel is encircled with a vessel loop.  A pair of CV-4 Gore-tex sutures are place as diamond-shaped purse-strings on the anterior surface of the femoral artery.  The patient is heparinized systemically and ACT verified > 250 seconds.  The common femoral artery is punctured using an 18 gauge needle and a soft J-tipped guidewire is passed into the common iliac artery under fluoroscopic guidance.  A 6 Fr straight diagnostic catheter is placed over the guidewire and the guidewire is removed.  An Amplatz super stiff guidewire is passed through the sheath into the descending thoracic aorta and the introducing diagnostic catheter is removed.  Serial dilators are  passed over the guidewire under continuous fluoroscopic guidance, making certain that each dilator passes easily all of the way into the distal abdominal aorta.  A 14 Fr Commander introducer sheath is passed over the guidewire into the abdominal aorta.  The introducing dilator is removed, the sheath is flushed with heparinized saline, and the sheath is secured to the skin.    FEMORAL SHEATH REMOVAL AND ARTERIAL CLOSURE:  After the completion of successful valve deployment as documented separately by Dr. Excell Seltzerooper, the femoral artery sheath is removed and the arteriotomy is closed using the previously placed Gore-tex purse-string sutures. Once the repair has been completed protamine was administered to reverse the anticoagulation. A digitally-subtracted arteriogram is obtained from just above the aortic bifurcation to below the arteriotomy to confirm the integrity of the vascular repair.  The incision is irrigated with saline solution and subsequently closed in multiple layers using absorbable suture.  The skin incision is closed using a subcuticular skin closure.     Salvatore Decentlarence H. Cornelius Moraswen MD 09/05/2014 4:44 PM

## 2014-09-05 NOTE — H&P (View-Only) (Signed)
 Cardiology Office Note   Date:  08/29/2014   ID:  [name], DOB 12/28/1932, MRN [id number]  PCP:  ESCAJEDA, RICHARD, MD  Cardiologist:  Yocelyn Brocious, MD    Chief Complaint  Patient presents with  . Aortic Stenosis     History of Present Illness: [name] is a 81 y.o. female who presents for evaluation of severe aortic stenosis.   The patient's cardiac history dates back to 2009 when she presented with progressive exertional angina. Her anginal equivalent over time has been left shoulder and arm pain. She was found to have moderate aortic stenosis and three-vessel coronary artery disease. She ultimately was treated with aortic valve replacement using a 21 mm Sorin Mitroflow bioprosthetic tissue valve and 4 vessel CABG. Septal myectomy was also performed. She did well until approximately 2 years ago when she began having exertional arm pain again.  She complains of exertional left shoulder and arm pain. Symptoms are similar to those she experienced before her original heart surgery. She had complete resolution of these symptoms after surgery, but exertional pain has recurred and now progressed over the past few years. Recently she has had symptoms with low levels of physical exertion. She describes pain as an ache in the neck, shoulder, and left arm. Pain resolves with rest. There is associated shortness of breath. There is also associated dizziness, but no syncope. No central chest pain. Arm pain symptoms occur with walking or other forms of physical exertion.  Because of progressive symptoms, a repeat echocardiogram was done. This demonstrated severe bioprosthetic aortic valve stenosis with preserved LV systolic function. Her peak and mean transaortic valve gradients by echo were 64 and 40 mmHg, respectively. LVEF is 55-60%.  The patient has now undergone extensive evaluation with a gated cardiac CTA, CTA of the chest abdomen and pelvis, cardiac catheterization, and TEE.  Notable findings include continued patency of all of her bypass grafts, confirmation of severe aortic stenosis by invasive hemodynamics with a mean transvalvular gradient of 39 mmHg and the Cath Lab. TEE showed severe bioprosthetic valve leaflet dysfunction with restricted leaflet mobility. The patient has been seen by Dr Owen and Dr Hendrickson and is felt to be at high risk of conventional surgery because of her previous cardiac surgery and small aortic root which would likely necessitate root enlargement at the time of AVR.    Past Medical History  Diagnosis Date  . Aortic stenosis     a.  s/p tissue AVR at time of CABG in 2009;  b. Echo 04/2012: EF 55-60%, moderate AS (mean 34);  c. Echo 6/14: Mild LVH, mild focal basal septal hypertrophy, EF 55-60%, normal wall motion, grade 2 diastolic dysfunction, AVR with moderate aortic stenosis (mean 36) - consider TEE if clinically indicated, mild AI, mild MR, PASP 44, ascending aorta mild to moderately dilated (consider CTA or MRA)  . Dyslipidemia   . Hypertension   . Spinal arthritis   . CAD (coronary artery disease)     a. s/p CABG; b. Myoview 06/2011: No ischemia, EF 67%;  c. 01/2013 Cath: LM min irregs, LAD small, LCX 100m OMs ok, RCA known 100, VG->RCA ok, VG->OM1->2 ok, LIMA->LAD ok.  . Hx of CABG     LIMA-LAD, SVG-RCA, SVG-OM1/OM2 in 2009  . Blindness of right eye     due to retinal bleed  . Glaucoma   . Left bundle branch block   . Carotid stenosis     Carotid U/S 5/13:  bilat 40-59%  .   Shortness of breath   . Heart murmur   . Chronic kidney disease     renal insufficiency  . GERD (gastroesophageal reflux disease)   . Arthritis   . Blind right eye   . PONV (postoperative nausea and vomiting)     nausea  . HOH (hard of hearing)   . S/P aortic valve replacement with bioprosthetic valve 07/28/2007    #21mm Sorin Mitroflow pericardial tissue valve    . S/P CABG x 4 07/28/2007    LIMA to LAD, SVG to RCA, sequential SVG to OM1-OM2  .  Unstable angina 01/14/2013  . Occlusion and stenosis of carotid artery without mention of cerebral infarction 11/28/2011  . Aortic stenosis, severe 07/03/2014    Recurrent, due to prosthetic valve stenosis   . Prosthetic valve dysfunction   . Renal insufficiency     Past Surgical History  Procedure Laterality Date  . Coronary artery bypass graft  Jan 2009    LIMA to LAD, SVG to RCA, SVG to OM 1 & 2  . Aortic valve replacement  Jan 2009    #21 mm pericardial prosthesis  . Back surgery    . Neck surgery    . Abdominal hysterectomy    . Cholecystectomy    . Eye surgery    . Cardiac catheterization  2014  . Appendectomy    . Posterior cervical laminectomy Left 11/04/2013    Procedure: Left Cervical Four-Five Foraminotomy ;  Surgeon: Henry A Pool, MD;  Location: [facility];  Service: Neurosurgery;  Laterality: Left;  Left Cervical Four-Five Foraminotomy   . Left and right heart catheterization with coronary/graft angiogram N/A 01/13/2013    Procedure: LEFT AND RIGHT HEART CATHETERIZATION WITH CORONARY/GRAFT ANGIOGRAM;  Surgeon: James Hochrein, MD;  Location: [facility]ATH [facility];  Service: Cardiovascular;  Laterality: N/A;  . Left heart catheterization with coronary/graft angiogram N/A 07/03/2014    Procedure: LEFT HEART CATHETERIZATION WITH CORONARY/GRAFT ANGIOGRAM;  Surgeon: Lafawn Lenoir D Annasophia Crocker, MD;  Location: [facility];  Service: Cardiovascular;  Laterality: N/A;  . Tee without cardioversion N/A 08/10/2014    Procedure: TRANSESOPHAGEAL ECHOCARDIOGRAM (TEE);  Surgeon: Katarina H Nelson, MD;  Location: [facility];  Service: Cardiovascular;  Laterality: N/A;    Current Outpatient Prescriptions  Medication Sig Dispense Refill  . aspirin 81 MG tablet Take 81 mg by mouth daily.      . atorvastatin (LIPITOR) 80 MG tablet Take 80 mg by mouth daily.    . benazepril (LOTENSIN) 20 MG tablet Take 20 mg by mouth daily.    . Calcium Carbonate (CALTRATE 600 PO) Take 1 tablet by mouth daily.     .  fexofenadine (ALLEGRA) 180 MG tablet Take 180 mg by mouth daily.    . hydrochlorothiazide (HYDRODIURIL) 25 MG tablet TAKE 1 TABLET BY MOUTH EVERY DAY 90 tablet 3  . ibuprofen (ADVIL,MOTRIN) 200 MG tablet Take 200 mg by mouth every 6 (six) hours as needed for pain or headache (headache).    . NITROSTAT 0.3 MG SL tablet Place 0.3 mg under the tongue every 5 (five) minutes as needed for chest pain.     . timolol (TIMOPTIC) 0.5 % ophthalmic solution Place 1 drop into both eyes 2 (two) times daily.    . Vitamin D, Ergocalciferol, (DRISDOL) 50000 UNITS CAPS Take 50,000 Units by mouth every 7 (seven) days. On Tuesday    . zolpidem (AMBIEN) 5 MG tablet Take 5 mg by mouth at bedtime as needed for sleep (sleep).       No current facility-administered medications for this visit.    Allergies:   Zetia   Social History:  The patient  reports that she has never smoked. She has never used smokeless tobacco. She reports that she does not drink alcohol or use illicit drugs.   Family History:  The patient's  family history includes Cancer in her mother; Heart attack in her brother and father.    ROS:  Please see the history of present illness.  Otherwise, review of systems is positive for hearing loss, cough, dizziness, easy bruising, problems with balance.  All other systems are reviewed and negative.    PHYSICAL EXAM: VS:  BP 120/78 mmHg  Pulse 63  Ht 5' 1" (1.549 m)  Wt 149 lb (67.586 kg)  BMI 28.17 kg/m2 , BMI Body mass index is 28.17 kg/(m^2). GEN: Well nourished, well developed, in no acute distress HEENT: normal Neck: no JVD, no masses. Bilateral carotid upstrokes are diminished with bruits.  Cardiac: RRR with grade 3/6 harsh systolic murmur at the RUSB             Respiratory:  clear to auscultation bilaterally, normal work of breathing GI: soft, nontender, nondistended, + BS MS: no deformity or atrophy Ext: no pretibial edema Skin: warm and dry, no rash Neuro:  Strength and sensation are  intact Psych: euthymic mood, full affect  EKG:  EKG is not ordered today.  Recent Labs: 06/27/2014: ALT 14; Hemoglobin 11.7*; Platelets 217 08/04/2014: BUN 30*; Creatinine 1.53*; Potassium 4.7; Sodium 135   Lipid Panel     Component Value Date/Time   CHOL 175 05/10/2013 1005   TRIG 198.0* 05/10/2013 1005   HDL 37.40* 05/10/2013 1005   CHOLHDL 5 05/10/2013 1005   VLDL 39.6 05/10/2013 1005   LDLCALC 98 05/10/2013 1005   LDLDIRECT 126.2 09/12/2011 0829      Wt Readings from Last 3 Encounters:  08/29/14 149 lb (67.586 kg)  08/21/14 150 lb (68.04 kg)  08/04/14 150 lb (68.04 kg)     Cardiac Studies Reviewed: 2D ECHO Study Conclusions  - Left ventricle: The cavity size was normal. Wall thickness was increased in a pattern of moderate LVH. Systolic function was normal. The estimated ejection fraction was in the range of 55% to 60%. Wall motion was normal; there were no regional wall motion abnormalities. Doppler parameters are consistent with abnormal left ventricular relaxation (grade 1 diastolic dysfunction). - Aortic valve: Trileaflet; severely calcified leaflets. There was severe stenosis. There was mild regurgitation. Mean gradient (S): 42 mm Hg. Peak gradient (S): 64 mm Hg. Valve area (VTI): 0.82 cm^2. - Mitral valve: Mildly to moderately calcified annulus. Mildly calcified leaflets . There was trivial regurgitation. - Left atrium: The atrium was mildly dilated. - Right ventricle: The cavity size was normal. Systolic function was normal. - Tricuspid valve: Peak RV-RA gradient (S): 31 mm Hg. - Pulmonary arteries: PA peak pressure: 34 mm Hg (S). - Inferior vena cava: The vessel was normal in size. The respirophasic diameter changes were in the normal range (= 50%), consistent with normal central venous pressure.  Impressions:  - Normal LV size with moderate LV hypertrophy. EF 55-60%. Normal RV size and systolic function. Severe aortic  stenosis with mild aortic insufficiency.  TEE: S/P Mitroflow Sorin aortic valve replacement. The leaflets are severely thickened and calcified with almost fixed non-coronary and left coronary cusps and only limited motion of the right coronary cusp. There is severe aortic stenosis (gradients - peak 79 and mean 35 mmHg are   off axis and therefore underestimated). There is small annulus measuring 18 mm.  There is moderate aortic insufficiency.   Ascending aorta is moderately dilated measuring 48 mm.  CARDIAC CATH: Procedural Findings: Hemodynamics: AO 196/25 LV 162/56 with a mean of 90 Aortic mean gradient 39 mmHg  Coronary angiography: Coronary dominance: right  Left mainstem: The left main stem is ectatic. There is heavy calcification without obstructive disease.  Left anterior descending (LAD): The LAD is severely calcified. The vessel has 90% proximal stenosis. The first diagonal is seen to fill faintly. The mid and distal LAD fill from the mammary graft.  Left circumflex (LCx): The left circumflex appears to have severe ostial stenosis. This is estimated at 80%. The mid circumflex is totally occluded. The obtuse marginal branches fill entirely from a sequential saphenous vein graft.  Right coronary artery (RCA): Severe calcification with total occlusion proximally.  LIMA to LAD: Widely patent to the mid LAD. The mid and distal LAD are small in caliber. The proximal LAD fills retrograde from the graft and this fills the diagonal branch.  Saphenous vein graft sequential to OM1 and OM 2: Widely patent with graft ectasia noted. There are diffuse irregularities. There are no significant stenoses. Multiple OM subbranch is fill from this bypass graft.  Saphenous vein graft to distal RCA: Patent throughout. No significant stenosis identified. The native RCA fills retrograde from the graft. The PDA and PLA branches fill from the graft.  Left ventriculography: Ventriculography was  deferred because of kidney disease. Plain fluoroscopy demonstrates severe calcification and restricted mobility of prosthetic aortic valve leaflets.  Estimated Blood Loss: Minimal  Final Conclusions:  1. Severe native three-vessel coronary artery disease 2. Status post multivessel CABG with continued patency of the LIMA to LAD, sequential saphenous vein graft to OM1 and OM 2, and saphenous vein graft to distal RCA 3. Severe bioprosthetic aortic valve stenosis  Recommendations: TCTS evaluation for redo AVR versus valve-in-valve TAVR.   CTA Chest/Abdomen/Pelvis: VASCULAR MEASUREMENTS PERTINENT TO TAVR:  AORTA:  Minimal Aortic Diameter - 9 x 7 mm  Severity of Aortic Calcification - Severe  RIGHT PELVIS:  Right Common Iliac Artery -  Minimal Diameter - 6.4 x 8.2 mm  Tortuosity - Mild  Calcification - Moderate to severe  Right External Iliac Artery -  Minimal Diameter - 5.8 x 5.4 mm  Tortuosity - Mild  Calcification - Mild  Right Common Femoral Artery -  Minimal Diameter - 5.3 x 4.7 mm  Tortuosity - Mild  Calcification - Mild  LEFT PELVIS:  Left Common Iliac Artery -  Minimal Diameter - 6.6 x 7.1 mm  Tortuosity - Mild  Calcification - Moderate to severe  Left External Iliac Artery -  Minimal Diameter - 5.8 x 4.3 mm  Tortuosity - Mild  Calcification - Mild  Left Common Femoral Artery -  Minimal Diameter - 6.1 x 4.6 mm  Tortuosity - Mild  Calcification - Mild  Review of the MIP images confirms the above findings.  IMPRESSION: 1. Vascular findings and measurements pertinent to potential TAVR procedure, as detailed above. This patient does not appear to have suitable pelvic arterial access. 2. Status post cholecystectomy. There is severe dilatation of the common bile duct measuring up to 21 mm in the porta hepatis. No definite ductal stone noted. No intrahepatic biliary ductal dilatation. This finding is of  uncertain etiology and significance, but is favored to reflect post cholecystectomy physiology and advanced age. Clinical correlation for signs and symptoms of biliary tract obstruction   is recommended, however. 3. Colonic diverticulosis without findings to suggest acute diverticulitis at this time. 4. Additional incidental findings, as above.  STS Risk Calculator ProcedureRedo AVR  Risk of Mortality5.7% Morbidity or Mortality24.9% Prolonged LOS10.3% Short LOS22.3% Permanent Stroke2.9% Prolonged Vent Support19.4% DSW Infection0.1% Renal Failure6.0% Reoperation8.3%  ASSESSMENT AND PLAN: 81-year-old woman with stage D severe symptomatic aortic stenosis. Her symptoms are highly typical of angina that I suspect is related to aortic stenosis since all of her bypass grafts are patent. Symptoms have now progressed over the last 2 years and she has functional class III symptoms at present.  I have reviewed the natural history of aortic stenosis with the patient and her husband who is present today. We have discussed the limitations of medical therapy and the poor prognosis associated with symptomatic aortic stenosis. We have also reviewed potential treatment options, including palliative medical therapy, conventional surgical aortic valve replacement, and transcatheter aortic valve replacement. We discussed treatment options in the context of this patient's specific comorbid medical conditions.   She has undergone evaluation by Dr Hendrickson and Dr Owen, and we have had extensive discussion about her case. We feel that valve-in-valve TAVR is an appropriate  alternative treatment to high-risk conventional redo surgery in this elderly woman who would likely require aortic root replacement at the time of aortic valve replacement. We plan to treat her with a 20 mm Sapien 3 THV based on the size of her previous bioprosthesis. The patient's iliofemoral vessels are borderline for placement of a 14 Fr E-sheath, but it seems reasonable to attempt a transfemoral approach. She understands that if this is not technically feasible, we will proceed with a transapical approach.  Following the decision to proceed with transcatheter aortic valve replacement, a discussion has been held regarding what types of management strategies would be attempted intraoperatively in the event of life-threatening complications, including whether or not the patient would be considered a candidate for the use of cardiopulmonary bypass and/or conversion to open sternotomy for attempted surgical intervention.  The patient has been advised of a variety of complications that might develop including but not limited to risks of death, stroke, paravalvular leak, aortic dissection or other major vascular complications, aortic annulus rupture, device embolization, cardiac rupture or perforation, mitral regurgitation, acute myocardial infarction, arrhythmia, heart block or bradycardia requiring permanent pacemaker placement, congestive heart failure, respiratory failure, renal failure, pneumonia, infection, other late complications related to structural valve deterioration or migration, or other complications that might ultimately cause a temporary or permanent loss of functional independence or other long term morbidity.  The patient provides full informed consent for the procedure as described and all questions were answered.  Current medicines are reviewed with the patient today.  The patient has no concerns regarding medicines.  The following changes have been made:  no change  Labs/ tests ordered  today include:  No orders of the defined types were placed in this encounter.    Signed, Josslyn Ciolek, MD  08/29/2014 1:03 PM    [facility] [address], [place], [place]  [address] Phone: [phone]; Fax: [phone]   

## 2014-09-05 NOTE — Op Note (Addendum)
[facility]  TAVR OPERATIVE NOTE   Date of Procedure:  09/05/2014  Preoperative Diagnosis: Severe Aortic Stenosis   Postoperative Diagnosis: Same   Procedure:   Transcatheter Aortic Valve Replacement - Transfemoral Approach  Edwards Sapien 3 THV (size 20 mm, model # 9600TFX, serial # [id number])   Co-Surgeons:  Salvatore Decent. Cornelius Moras, MD and Tonny Bollman, MD  Assistants:   Verne Carrow, MD  Anesthesiologist:  Arta Bruce, MD  Echocardiographer:  Tobias Alexander, MD  Pre-operative Echo Findings:  Severe aortic stenosis  Normal left ventricular systolic function  Mild MR  Post-operative Echo Findings:  Trivial paravalvular leak  Normal left ventricular systolic function  Mild MR (no change)  BRIEF CLINICAL NOTE AND INDICATIONS FOR SURGERY The patient's cardiac history dates back to 2009 when she presented with progressive exertional angina. Her anginal equivalent over time has been left shoulder and arm pain. She was found to have moderate aortic stenosis and three-vessel coronary artery disease. She ultimately was treated with aortic valve replacement using a 21 mm Sorin Mitroflow bioprosthetic tissue valve and 4 vessel CABG. Septal myectomy was also performed. She did well until approximately 2 years ago when she began having exertional arm pain again.  She complains of exertional left shoulder and arm pain. Symptoms are similar to those she experienced before her original heart surgery. She had complete resolution of these symptoms after surgery, but exertional pain has recurred and now progressed over the past few years.   Because of progressive symptoms, a repeat echocardiogram was done. This demonstrated severe bioprosthetic aortic valve stenosis with preserved LV systolic function. Her peak and mean transaortic valve gradients by echo were 64 and 40 mmHg, respectively. LVEF is 55-60%.  The patient has now undergone extensive evaluation with a gated  cardiac CTA, CTA of the chest abdomen and pelvis, cardiac catheterization, and TEE. Notable findings include continued patency of all of her bypass grafts, confirmation of severe aortic stenosis by invasive hemodynamics with a mean transvalvular gradient of 39 mmHg and the Cath Lab. TEE showed severe bioprosthetic valve leaflet dysfunction with restricted leaflet mobility. The patient has been seen by Dr Cornelius Moras and Dr Dorris Fetch and is felt to be at high risk of conventional surgery because of her previous cardiac surgery and small aortic root which would likely necessitate root enlargement at the time of AVR.  During the course of the patient's preoperative work up they have been evaluated comprehensively by a multidisciplinary team of specialists coordinated through the [facility] in the [facility].  They have been demonstrated to suffer from symptomatic severe aortic stenosis as noted above. The patient has been counseled extensively as to the relative risks and benefits of all options for the treatment of severe aortic stenosis including long term medical therapy, conventional surgery for aortic valve replacement, and transcatheter aortic valve replacement.  The patient has been independently evaluated by two cardiac surgeons including Dr Cornelius Moras and Dr. Dorris Fetch, and they are felt to be at high risk for conventional surgical aortic valve replacement based upon a predicted risk of mortality using the [employer] of [employer] risk calculator of 5.7%. Both surgeons indicated the patient would be a poor candidate for conventional surgery (predicted risk of mortality >15% and/or predicted risk of permanent morbidity >50%) because of comorbidities including previous cardiac surgery and small aortic root size.   Based upon review of all of the patient's preoperative diagnostic tests they are felt to be candidate for  transcatheter aortic valve replacement using  the transfemoral approach as an alternative to high risk conventional surgery.    Following the decision to proceed with transcatheter aortic valve replacement, a discussion has been held regarding what types of management strategies would be attempted intraoperatively in the event of life-threatening complications, including whether or not the patient would be considered a candidate for the use of cardiopulmonary bypass and/or conversion to open sternotomy for attempted surgical intervention.  The patient has been advised of a variety of complications that might develop peculiar to this approach including but not limited to risks of death, stroke, paravalvular leak, aortic dissection or other major vascular complications, aortic annulus rupture, device embolization, cardiac rupture or perforation, acute myocardial infarction, arrhythmia, heart block or bradycardia requiring permanent pacemaker placement, congestive heart failure, respiratory failure, renal failure, pneumonia, infection, other late complications related to structural valve deterioration or migration, or other complications that might ultimately cause a temporary or permanent loss of functional independence or other long term morbidity.  The patient provides full informed consent for the procedure as described and all questions were answered preoperatively.    DETAILS OF THE OPERATIVE PROCEDURE  PREPARATION:    The patient is brought to the operating room on the above mentioned date and central monitoring was established by the anesthesia team including placement of Swan-Ganz catheter and radial arterial line. The patient is placed in the supine position on the operating table.  Intravenous antibiotics are administered. General endotracheal anesthesia is induced uneventfully. A Foley catheter is placed.  Baseline transesophageal echocardiogram was performed. The patient's chest, abdomen, both groins, and both lower extremities are  prepared and draped in a sterile manner. A time out procedure is performed.   PERIPHERAL ACCESS:    Using the modified Seldinger technique, femoral arterial and venous access was obtained with placement of 6 Fr sheaths on the left side.  A pigtail diagnostic catheter was passed through the left femoral arterial sheath under fluoroscopic guidance into the aortic root.  A temporary transvenous pacemaker catheter was passed through the left femoral venous sheath under fluoroscopic guidance into the right ventricle.  The pacemaker was tested to ensure stable lead placement and pacemaker capture. Aortic root angiography was performed in order to determine the optimal angiographic angle for valve deployment.   TRANSFEMORAL ACCESS:   A right femoral arterial cutdown was performed by Dr Cornelius Moras. Please see his separate operative note for details. The patient was heparinized systemically and ACT verified > 250 seconds.    A 14 Fr transfemoral E-sheath was introduced into the right femoral artery after progressively dilating over an Amplatz superstiff wire. An AL-2 catheter was used to direct a straight-tip exchange length wire across the native aortic valve into the left ventricle. This was exchanged out for a pigtail catheter and position was confirmed in the LV apex. Simultaneous LV and Ao pressures were recorded.  The pigtail catheter was then exchanged for an Amplatz Extra-stiff wire in the LV apex.   TRANSCATHETER HEART VALVE DEPLOYMENT:  An Edwards Sapien 3 THV (size 20 mm) was prepared and crimped per manufacturer's guidelines, and the proper orientation of the valve is confirmed on the [employer] delivery system. The valve was advanced through the introducer sheath using normal technique until in an appropriate position in the abdominal aorta beyond the sheath tip. The balloon was then retracted and using the fine-tuning wheel was centered on the valve. The valve was then advanced across the aortic  arch using appropriate flexion  of the catheter. The valve was carefully positioned across the previously implanted aortic valve bioprosthesis. The Commander catheter was retracted using normal technique. Once final position of the valve has been confirmed by fluoroscopy, the valve is deployed while temporarily holding ventilation and during rapid ventricular pacing to maintain systolic blood pressure < 50 mmHg and pulse pressure < 10 mmHg. The balloon inflation is held for >3 seconds after reaching full deployment volume. Once the balloon has fully deflated the balloon is retracted into the ascending aorta and valve function is assessed using TEE. There is felt to be trivial paravalvular leak and no central aortic insufficiency.  The patient's hemodynamic recovery following valve deployment is good.  The deployment balloon and guidewire are both removed. Echo demostrated acceptable post-procedural gradients, stable mitral valve function, and trivial AI.   PROCEDURE COMPLETION:  The sheath was then removed and arteriotomy repaired by Dr Cornelius Moraswen. Please see his separate report for details. Distal abdominal aortography was performed to evaluate for any arterial injury related to the procedure. There was no evidence dissection, perforation, or other vascular injury in the abdominal aorta, iliac artery, or femoral artery.  Protamine was administered once femoral arterial repair was complete. The temporary pacemaker, pigtail catheters and femoral sheaths were removed with manual pressure used for hemostasis.   The patient tolerated the procedure well and is transported to the surgical intensive care in stable condition. There were no immediate intraoperative complications. All sponge instrument and needle counts are verified correct at completion of the operation.   No blood products were administered during the operation.  The patient received a total of 30 mL of intravenous contrast during the  procedure.  Tonny BollmanMichael Keo Schirmer MD 09/05/2014 4:23 PM

## 2014-09-05 NOTE — H&P (Signed)
[address]       [place] [address]             [phone]          CARDIOTHORACIC SURGERY HISTORY AND PHYSICAL EXAM  Primary Cardiologist is [name], MD  Referring Provider is Loreli Slot, MD PCP is Tarri Fuller, MD  Chief Complaint  Patient presents with  . 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 The patient was first told she had a heart murmur many years ago, and she was diagnosed with mild to moderate aortic stenosis based on transthoracic echocardiograms. In 2009 she presented with accelerating symptoms of exertional pain localized to her left shoulder and arm that radiated up into her neck. Nuclear stress test was abnormal and suggestive of reversible myocardial ischemia. Echocardiogram at that time demonstrated the presence of moderate aortic stenosis with mean transvalvular gradient 17 mmHg by echo. Left ventricular systolic function was normal. She underwent cardiac catheterization demonstrating the presence of severe three-vessel coronary artery disease. She underwent aortic valve replacement using a 21 mm Sorin Mitroflow bioprosthetic tissue valve and coronary artery bypass grafting 4 by Dr. Dorris Fetch. Findings at the time of surgery were notable for the presence of a relatively small sized aortic root. A septal myomectomy was performed as well. The patient's early postoperative recovery was notable for the development of postoperative atrial fibrillation and a left pleural effusion  requiring thoracentesis. She otherwise recovered uneventfully and her symptoms of pain in her shoulder and neck resolved. The patient states that she did well until approximately 2 years ago when she first began to experience a recurrence of similar symptoms of pain in her left shoulder and arm that are brought on with physical exertion and relieved by rest. Symptoms are described as a dull pain that oftentimes radiates up her neck to the left side of her jaw. Symptoms are at times associated with exertional shortness of breath. Over the past 2 years symptoms have progressed, particularly over the last few months. This has dramatically limited her physical activity. The patient underwent uncomplicated left sided C4-5 laminotomy and foraminotomy for decompression of cervical spondylosis with radiculitis by Dr. Dutch Quint in May 2015. The patient recovered from this surgery uneventfully, but her symptoms of left shoulder and arm pain have persisted. Given the nature of the symptoms and the lack of corresponding radiographic evidence to suggest the possibility that her symptoms are related to her degenerative disease of the cervical spine, the patient was referred back to Dr. Antoine Poche to reconsider possible cardiac etiology. Follow-up transthoracic echocardiogram performed 12/06/2013 revealed severe prosthetic valve dysfunction involving the patient's aortic valve with severe recurrent aortic stenosis and mild aortic insufficiency. Peak velocity across the aortic valve measured in excess of 4 m/s corresponding to peak and mean transvalvular gradients estimated 64 and 40 mmHg, respectively. Left ventricular systolic function was preserved with ejection fraction estimated 55-60%. Over the past 6 months the patient's symptoms have continued to accelerate. She now gets exertional pain in her shoulder with minimal activity and occasionally at rest. She was seen  in follow-up by Dr. Antoine Poche in early December and  subsequently scheduled for diagnostic cardiac catheterization. This was performed by Dr. Excell Seltzer 07/03/2014. Catheterization confirmed the presence of severe prosthetic valve aortic stenosis with mean transvalvular gradient 39 mmHg and at least mild aortic insufficiency. The patient had severe native three-vessel coronary artery disease, but all of her previously placed bypass grafts remained widely patent and free of any significant disease. The patient was referred for surgical consultation and evaluated last week by Dr. Dorris Fetch. The patient is felt to be high risk for conventional surgical redo aortic valve replacement, and she has been referred for a second surgical opinion to consider valve-in-valve transcatheter aortic valve replacement as an alternative.  Patient returns to the office today for recurrent stage D severe symptomatic aortic stenosis related to prosthetic valve dysfunction, originally status post aortic valve replacement using a bioprosthetic tissue valve in 2009. She was originally seen in consultation by Dr. Dorris Fetch on 07/12/2014 after which time she was referred for a second surgical opinion to discuss valve-in-valve transcatheter aortic valve replacement as an alternative to high-risk conventional surgery. I had the opportunity to see her on 07/18/2014 and she was seen most recently on 08/04/2014. Since then she underwent transesophageal echocardiogram by Dr. Delton See. She returns to the office today to review the results of this test and discuss treatment options further. She states that after she underwent transesophageal echocardiogram she developed a productive cough. She was seen by her primary care physician and has been treated with oral antibiotics and bronchodilators. The cough has improved considerably and now has almost completely resolved. She otherwise reports no problems or complaints.   The patient is married and lives with her husband in [place], [place]. 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 She has remained physically active until the last 2 years during which time she has experienced gradual aggressive decline in her function status because of the exertional pain in her left shoulder and arm as noted previously. The patient otherwise does not have significant physical limitations. She states that she now gets exertional pain in her left shoulder which radiates up and down her left arm and up the left side of her neck on nearly a daily basis. The pain is described as a dull pain, and it is very similar to the symptoms the patient experienced prior to her heart surgery in 2009. Symptoms are currently brought on with very mild activity and occasionally occur at rest. They are usually relieved by rest. She gets fatigued very easily. She has had 1 or 2 episodes where she has awoke from her sleep with pain and had to sit up in bed. She has had frequent dizzy spells without syncope. She has some mild shortness of breath associated with severe episodes of pain. She otherwise denies exertional shortness of breath. She denies any history of PND, orthopnea, or lower extremity edema.        Past Medical History  Diagnosis Date  . Aortic stenosis     a.  s/p tissue AVR at time of CABG in 2009;  b. Echo 04/2012: EF 55-60%, moderate AS (mean 34);  c. Echo 6/14: Mild LVH, mild focal basal septal hypertrophy, EF 55-60%, normal wall motion, grade 2 diastolic dysfunction, AVR with moderate aortic stenosis (mean 36) - consider TEE if clinically indicated, mild AI, mild MR, PASP 44, ascending aorta mild to moderately dilated (consider CTA  or MRA)  . Dyslipidemia   . Hypertension   . Spinal arthritis   . CAD (coronary artery disease)     a. s/p CABG; b. Myoview 06/2011: No ischemia, EF 67%;  c. 01/2013 Cath: LM min irregs, LAD small, LCX 164m OMs ok, RCA known 100, VG->RCA ok,  VG->OM1->2 ok, LIMA->LAD ok.  Marland Kitchen Hx of CABG     LIMA-LAD, SVG-RCA, SVG-OM1/OM2 in 2009  . Blindness of right eye     due to retinal bleed  . Glaucoma   . Left bundle branch block   . Heart murmur   . Chronic kidney disease     renal insufficiency  . GERD (gastroesophageal reflux disease)   . Arthritis   . Blind right eye   . HOH (hard of hearing)   . S/P aortic valve replacement with bioprosthetic valve 07/28/2007    #6mm Sorin Mitroflow pericardial tissue valve    . S/P CABG x 4 07/28/2007    LIMA to LAD, SVG to RCA, sequential SVG to OM1-OM2  . Unstable angina 01/14/2013  . Aortic stenosis, severe 07/03/2014    Recurrent, due to prosthetic valve stenosis   . Prosthetic valve dysfunction   . Renal insufficiency   . PONV (postoperative nausea and vomiting)     nausea  yrs ago  . Carotid stenosis     Carotid U/S 5/13:  bilat 40-59%  . Occlusion and stenosis of carotid artery without mention of cerebral infarction 11/28/2011  . Shortness of breath     occ  . History of hiatal hernia   . Cancer     ovarian?    Past Surgical History  Procedure Laterality Date  . Coronary artery bypass graft  Jan 2009    LIMA to LAD, SVG to RCA, SVG to OM 1 & 2  . Aortic valve replacement  Jan 2009    #21 mm pericardial prosthesis  . Back surgery    . Neck surgery      cervical  . Abdominal hysterectomy    . Cholecystectomy    . Cardiac catheterization  2014  . Appendectomy    . Posterior cervical laminectomy Left 11/04/2013    Procedure: Left Cervical Four-Five Foraminotomy ;  Surgeon: Temple Pacini, MD;  Location: [facility];  Service: Neurosurgery;  Laterality: Left;  Left Cervical Four-Five Foraminotomy   . Left and right heart catheterization with coronary/graft angiogram N/A 01/13/2013    Procedure: LEFT AND RIGHT HEART CATHETERIZATION WITH Isabel Caprice;  Surgeon: [name], MD;  Location: [facility] CATH LAB;  Service: Cardiovascular;  Laterality: N/A;  . Left heart  catheterization with coronary/graft angiogram N/A 07/03/2014    Procedure: LEFT HEART CATHETERIZATION WITH Isabel Caprice;  Surgeon: Micheline Chapman, MD;  Location: [facility] C[facility] LAB;  Service: Cardiovascular;  Laterality: N/A;  . Tee without cardioversion N/A 08/10/2014    Procedure: TRANSESOPHAGEAL ECHOCARDIOGRAM (TEE);  Surgeon: Lars Masson, MD;  Location: [facility] ENDOSCOPY;  Service: Cardiovascular;  Laterality: N/A;  . Eye surgery Right     retinal detachment blind    Family History  Problem Relation Age of Onset  . Cancer Mother   . Heart attack Father   . Heart attack Brother     Social History History  Substance Use Topics  . Smoking status: Never Smoker   . Smokeless tobacco: Never Used  . Alcohol Use: No    Prior to Admission medications   Medication Sig Start Date End Date Taking? Authorizing Provider  aspirin  81 MG tablet Take 81 mg by mouth daily.     Yes Historical Provider, MD  atorvastatin (LIPITOR) 80 MG tablet Take 80 mg by mouth daily.   Yes Historical Provider, MD  benazepril (LOTENSIN) 20 MG tablet Take 20 mg by mouth daily.   Yes Historical Provider, MD  Calcium Carbonate (CALTRATE 600 PO) Take 1 tablet by mouth daily.    Yes Historical Provider, MD  fexofenadine (ALLEGRA) 180 MG tablet Take 180 mg by mouth daily.   Yes Historical Provider, MD  hydrochlorothiazide (HYDRODIURIL) 25 MG tablet TAKE 1 TABLET BY MOUTH EVERY DAY 05/04/14  Yes [name], MD  ibuprofen (ADVIL,MOTRIN) 200 MG tablet Take 200 mg by mouth every 6 (six) hours as needed for pain or headache (headache).   Yes Historical Provider, MD  NITROSTAT 0.3 MG SL tablet Place 0.3 mg under the tongue every 5 (five) minutes as needed for chest pain.  11/19/12  Yes Historical Provider, MD  timolol (TIMOPTIC) 0.5 % ophthalmic solution Place 1 drop into both eyes 2 (two) times daily.   Yes Historical Provider, MD  Vitamin D, Ergocalciferol, (DRISDOL) 50000 UNITS CAPS Take 50,000 Units by mouth every  7 (seven) days. On Tuesday   Yes Historical Provider, MD  zolpidem (AMBIEN) 5 MG tablet Take 5 mg by mouth at bedtime as needed for sleep (sleep).   Yes Historical Provider, MD    Allergies  Allergen Reactions  . Zetia [Ezetimibe] Other (See Comments)    DIZZINESS     Review of Systems:  General:normal appetite, decreased energy, no weight gain, no weight loss, no fever Cardiac:+ chest pain with exertion, + chest pain at rest, + SOB with exertion, no resting SOB, no PND, no orthopnea, no palpitations, no arrhythmia, no atrial fibrillation, no LE edema, + dizzy spells, no syncope Respiratory:no shortness of breath, no home oxygen, no productive cough, no dry cough, no bronchitis, no wheezing, no hemoptysis, no asthma, no pain with inspiration or cough, no sleep apnea, no CPAP at night GI:no difficulty swallowing, no reflux, no frequent heartburn, no hiatal hernia, no abdominal pain, no constipation, no diarrhea, no hematochezia, no hematemesis, no melena GU:no dysuria, no frequency, no urinary tract infection, no hematuria, no kidney stones, + mild kidney disease Vascular:no pain suggestive of claudication, no pain in feet, no leg cramps, no varicose veins, no DVT, no non-healing foot ulcer Neuro:no stroke, no TIA's, no seizures, no headaches, no temporary blindness one eye, no slurred speech, no peripheral neuropathy, no chronic pain, no instability of gait, no memory/cognitive dysfunction Musculoskeletal:+ mild arthritis in both hands, no joint swelling, no myalgias, no difficulty walking, normal mobility  Skin:no rash, no itching, no skin infections, no pressure sores or  ulcerations Psych:no anxiety, no depression, no nervousness, no unusual recent stress Eyes:Diminished vision in left eye, otherwise no blurry vision, no floaters, no recent vision changes, + wears glasses or contacts ENT:no hearing loss, no loose or painful teeth, no dentures Hematologic:+ easy bruising, no abnormal bleeding, no clotting disorder, no frequent epistaxis Endocrine:no diabetes, does not check CBG's at home       Physical Exam:  BP 134/75 mmHg  Pulse 78  Resp 20  Ht  (1.549 m)  Wt 150 lb (68.04 kg)  BMI 28.36 kg/m2  SpO2 98% General:Elderly but o/w well-appearing HEENT:Unremarkable  Neck:no JVD, no bruits, no adenopathy  Chest:clear to auscultation, symmetrical breath sounds, no wheezes, no rhonchi  CV:RRR, grade IV/VI crescendo/decrescendo murmur heard best at RUSB, + early diastolic  murmur Abdomen:soft, non-tender, no masses  Extremities:warm, well-perfused, pulses diminished, no LE edema Rectal/GUDeferred Neuro:Grossly non-focal and symmetrical throughout Skin:Clean and dry, no rashes, no breakdown   Diagnostic Tests:  Transthoracic Echocardiography  Patient:  Tashawn, Greff MR #:    [id number] Study Date: 12/06/2013 Gender:   F Age:    40 Height:   154.9 cm Weight:   70.3 kg BSA:    1.76 m^2 Pt. Status: Room:  Avelina Laine  [facility] REFERRING  [facility] REFERRING  Tarri Fuller ATTENDING  Zoila Shutter MD SONOGRAPHER Clearence Ped, RCS PERFORMING  [facility], Outpatient  cc:  ------------------------------------------------------------------- LV EF: 55% -  60%  ------------------------------------------------------------------- Indications:   424.1 Aortic valve disorders.  ------------------------------------------------------------------- History:  PMH: s/p AVR, Carotid Stenosis. Coronary artery disease. Risk factors: Hypertension. Dyslipidemia.  ------------------------------------------------------------------- Study Conclusions  - Left ventricle: The cavity size was normal. Wall thickness was increased in a pattern of moderate LVH. Systolic function was normal. The estimated ejection fraction was in the range of 55% to 60%. Wall motion was normal; there were no regional wall motion abnormalities. Doppler parameters are consistent with abnormal left ventricular relaxation (grade 1 diastolic dysfunction). - Aortic valve: Trileaflet; severely calcified leaflets. There was severe stenosis. There was mild regurgitation. Mean gradient (S): 42 mm Hg. Peak gradient (S): 64 mm Hg. Valve area (VTI): 0.82 cm^2. - Mitral valve: Mildly to moderately calcified annulus. Mildly calcified leaflets . There was trivial regurgitation. - Left atrium: The atrium was mildly dilated. - Right ventricle: The cavity size was normal. Systolic function was normal. - Tricuspid valve: Peak RV-RA gradient (S): 31 mm Hg. - Pulmonary arteries: PA peak pressure: 34 mm Hg (S). - Inferior vena cava: The vessel was normal in size. The respirophasic diameter changes were in the normal range (= 50%), consistent with normal central venous pressure.  Impressions:  - Normal LV size with moderate LV hypertrophy. EF 55-60%. Normal RV size and systolic function. Severe aortic  stenosis with mild aortic insufficiency.  Transthoracic echocardiography. M-mode, complete 2D, spectral Doppler, and color Doppler. Birthdate: Patient birthdate: [birth year]. Age: Patient is 81 yr old. Sex: Gender: female. Height: Height: 154.9 cm. Height: 61 in. Weight: Weight: 70.3 kg. Weight: 154.7 lb. Body mass index: BMI: 29.3 kg/m^2. Body surface area:  BSA: 1.76 m^2. Blood pressure:   189/75 Patient status: Outpatient. Study date: Study date: 12/06/2013. Study time: 10:09 AM. Location: Echo laboratory.  -------------------------------------------------------------------  ------------------------------------------------------------------- Left ventricle: The cavity size was normal. Wall thickness was increased in a pattern of moderate LVH. Systolic function was normal. The estimated ejection fraction was in the range of 55% to 60%. Wall motion was normal; there were no regional wall motion abnormalities. Doppler parameters are consistent with abnormal left ventricular relaxation (grade 1 diastolic dysfunction).  ------------------------------------------------------------------- Aortic valve:  Trileaflet; severely calcified leaflets. Doppler: There was severe stenosis.  There was mild regurgitation. Valve area (VTI): 0.82 cm^2. Indexed valve area (VTI): 0.46 cm^2/m^2. Valve area (Vmax): 0.74 cm^2. Indexed valve area (Vmax): 0.42 cm^2/m^2.  Mean gradient (S): 42 mm Hg. Peak gradient (S): 64 mm Hg.  ------------------------------------------------------------------- Aorta: Aortic root: The aortic root was normal in size. Ascending aorta: The ascending aorta was normal in size.  ------------------------------------------------------------------- Mitral valve:  Mildly to moderately calcified annulus. Mildly calcified leaflets . Doppler:  There was no evidence for stenosis.  There was trivial regurgitation.  Peak gradient (D): 2 mm  Hg.  ------------------------------------------------------------------- Left atrium: The atrium was mildly dilated.  ------------------------------------------------------------------- Right ventricle: The  cavity size was normal. Systolic function was normal.  ------------------------------------------------------------------- Pulmonic valve:  Structurally normal valve.  Cusp separation was normal. Doppler: Transvalvular velocity was within the normal range. There was no regurgitation.  ------------------------------------------------------------------- Right atrium: The atrium was normal in size.  ------------------------------------------------------------------- Pericardium: There was no pericardial effusion.  ------------------------------------------------------------------- Systemic veins: Inferior vena cava: The vessel was normal in size. The respirophasic diameter changes were in the normal range (= 50%), consistent with normal central venous pressure. Diameter: 15 mm.  ------------------------------------------------------------------- Prepared and Electronically Authenticated by  Marca Anconaalton McLean, M.D. 2015-06-02T16:30:29  ------------------------------------------------------------------- Measurements  IVC              Value     12/07/2012 Reference ID               15  mm    ---------- ---------  Left ventricle         Value     12/07/2012 Reference LV ID, ED, PLAX    (N)   45  mm    35.5    43 - 52 chordal LV ID, ES, PLAX    (N)   28.3 mm    26.4    23 - 38 chordal LV fx shortening, PLAX (N)   37  %    26     >=29 chordal LV PW thickness, ED      14.9 mm    10.73   --------- IVS/LV PW ratio, ED  (N)   1.03      1.36    <=1.3 Stroke volume, 2D       85  ml    ---------- --------- Stroke volume/bsa, 2D      48  ml/m^2  ---------- --------- LV e&', lateral         4.72 cm/s   5.04    --------- LV E/e&', lateral        16.21     26.79   --------- LV e&', medial         3.84 cm/s   4.72    --------- LV E/e&', medial        19.92     28.6    --------- LV e&', average         4.28 cm/s   ---------- --------- LV E/e&', average        17.87     ---------- ---------  Ventricular septum       Value     12/07/2012 Reference IVS thickness, ED       15.4 mm    14.62   ---------  LVOT              Value     12/07/2012 Reference LVOT ID, S           19  mm    ---------- --------- LVOT area           2.84 cm^2   ---------- ---------  Aortic valve          Value     12/07/2012 Reference Aortic valve peak       401  cm/s   395    --------- velocity, S Aortic valve mean       296  cm/s   287    --------- velocity, S Aortic valve VTI, S      108  cm    98.6    --------- Aortic mean gradient,     40  mm Hg  36     ---------  S Aortic peak gradient,     64  mm Hg  62     --------- S Aortic valve area, VTI     0.82 cm^2   ---------- --------- Aortic valve area/bsa,     0.46 cm^2/m^2 ---------- --------- VTI Aortic valve area,       0.74 cm^2   ---------- --------- peak velocity Aortic valve area/bsa,     0.42 cm^2/m^2 ---------- --------- peak velocity Aortic regurg pressure     334  ms    436    --------- half-time  Aorta             Value     12/07/2012 Reference Aortic root ID, ED       34  mm    34     ---------  Left atrium          Value     12/07/2012 Reference LA ID/bsa, A-P     (H)   2.21 cm/m^2  2.2    <=2.2  Mitral  valve          Value     12/07/2012 Reference Mitral E-wave peak       76.5 cm/s   135    --------- velocity Mitral A-wave peak       112  cm/s   85.9    --------- velocity Mitral deceleration  (H)   296  ms    268    150 - 230 time Mitral peak gradient,     2   mm Hg  7     --------- D Mitral E/A ratio, peak     0.7      1.6    ---------  Pulmonary arteries       Value     12/07/2012 Reference PA pressure, S, DP   (H)   34  mm Hg  ---------- <=30  Tricuspid valve        Value     12/07/2012 Reference Tricuspid regurg peak     278  cm/s   311    --------- velocity Tricuspid peak RV-RA      31  mm Hg  39     --------- gradient Tricuspid maximal       278  cm/s   ---------- --------- regurg velocity, PISA  Right ventricle        Value     12/07/2012 Reference RV s&', lateral, S       7.79 cm/s   7.57    ---------  Legend: (L) and (H) mark values outside specified reference range.  (N) marks values inside specified reference range.      Cardiac Catheterization Procedure Note  Name: Annakate E Tingley MRN: [id number] DOB: September 05, 1932  Procedure: Left Heart Cath, Selective Coronary Angiography, saphenous vein graft angiography, LIMA angiography.  Indication: Progressive symptoms of angina, CCS class III. This 81 year old woman has a history of coronary and aortic valve disease. She underwent bioprosthetic aortic valve replacement with a 21 mm Mitroflow bioprosthetic valve and multivessel CABG in 2009. She did well after surgery with resolution of her symptoms until the past year. She has now developed recurrent neck and arm pain which was her previous anginal equivalent. This now occurs with low-level physical activity.  Procedural Details: The left  wrist was prepped, draped, and anesthetized with 1% lidocaine. Using the modified Seldinger technique, a 5/6 French Slender sheath was introduced into the left radial artery. 3 mg of verapamil was administered through the  sheath, weight-based unfractionated heparin was administered intravenously. Standard Judkins catheters were used for selective coronary angiography and left ventriculography. Catheter exchanges were performed over an exchange length guidewire. There were no immediate procedural complications. A TR band was used for radial hemostasis at the completion of the procedure. The patient was transferred to the post catheterization recovery area for further monitoring.  Procedural Findings: Hemodynamics: AO 196/25 LV 162/56 with a mean of 90 Aortic mean gradient 39 mmHg  Coronary angiography: Coronary dominance: right  Left mainstem: The left main stem is ectatic. There is heavy calcification without obstructive disease.  Left anterior descending (LAD): The LAD is severely calcified. The vessel has 90% proximal stenosis. The first diagonal is seen to fill faintly. The mid and distal LAD fill from the mammary graft.  Left circumflex (LCx): The left circumflex appears to have severe ostial stenosis. This is estimated at 80%. The mid circumflex is totally occluded. The obtuse marginal branches fill entirely from a sequential saphenous vein graft.  Right coronary artery (RCA): Severe calcification with total occlusion proximally.  LIMA to LAD: Widely patent to the mid LAD. The mid and distal LAD are small in caliber. The proximal LAD fills retrograde from the graft and this fills the diagonal branch.  Saphenous vein graft sequential to OM1 and OM 2: Widely patent with graft ectasia noted. There are diffuse irregularities. There are no significant stenoses. Multiple OM subbranch is fill from this bypass graft.  Saphenous vein graft to distal RCA: Patent throughout. No significant stenosis  identified. The native RCA fills retrograde from the graft. The PDA and PLA branches fill from the graft.  Left ventriculography: Ventriculography was deferred because of kidney disease. Plain fluoroscopy demonstrates severe calcification and restricted mobility of prosthetic aortic valve leaflets.  Estimated Blood Loss: Minimal  Final Conclusions:  1. Severe native three-vessel coronary artery disease 2. Status post multivessel CABG with continued patency of the LIMA to LAD, sequential saphenous vein graft to OM1 and OM 2, and saphenous vein graft to distal RCA 3. Severe bioprosthetic aortic valve stenosis  Recommendations: TCTS evaluation for redo AVR versus valve-in-valve TAVR.   Tonny Bollman MD, [facility] 07/03/2014, 9:47 AM            STS Risk Calculator  ProcedureRedo AVR  Risk of Mortality5.7% Morbidity or Mortality24.9% Prolonged LOS10.3% Short LOS22.3% Permanent Stroke2.9% Prolonged Vent Support19.4% DSW Infection0.1% Renal Failure6.0% Reoperation8.3%        Cardiac TAVR CT  TECHNIQUE: The patient was scanned on a Philips 256 scanner. A 120 kV retrospective scan was triggered in the descending thoracic aorta at 111 HU's. Gantry rotation speed was 270 msecs and collimation was .9 mm. 5 mg of iv metoprolol and no nitro were given. The 3D data set was reconstructed in 5% intervals of the R-R cycle. Systolic and diastolic phases were analyzed on a dedicated work station using MPR, MIP and VRT modes. The patient received 80 cc of contrast.  FINDINGS: Aortic Valve: A 21 mm Mitroflow pericardial bioprosthetic valve is in  the aortic position. Aortic valve leaflets are moderately thickened and mildly to moderately restricted leaflet opening.  Aorta: There is aneurysmal dilatation of the ascending thoracic aorta with maximum diameter 49 x 46 mm. Aortic arch and descending thoracic aorta are normal caliber. No dissection. Moderate diffuse calcifications.  Sinotubular Junction: 25 x 26 mm  Ascending Thoracic Aorta: 49 x 46 mm  Aortic Arch: 25 x 24 mm  Descending Thoracic Aorta: 25 x 24 mm  Coronary Artery  Height above Annulus:  Left Main: 6 mm  Right Coronary: 11 mm  Virtual Basal Annulus Measurements:  Maximum/Minimum Diameter: 21 x 21 mm  Area: 370 mm2  Aortic valve area: 0.9 cm2  Optimum Fluoroscopic Angle for Delivery: CAO 13, CAU 10  IMPRESSION: 1. A 21 mm Mitroflow pericardial bioprosthetic valve is in the aortic position. Aortic valve leaflets are moderately thickened and mildly to moderately restricted leaflet opening. Annulus is small measuring 21 x 21 mm with area 370 mm2 suitable for 20 mm S3 valve.  2. There is aneurysmal dilatation of the ascending thoracic aorta with maximum diameter 49 x 46 mm. Normal caliber of the remaining portions of the thoracic aorta.  3. Optimum Fluoroscopic Angle for Delivery: CAO 13, CAU 10  Tobias Alexander  Electronically Signed: By: Tobias Alexander On: 07/20/2014 18:07  OVER-READ INTERPRETATION CT CHEST  The following report is an over-read performed by radiologist Dr. [facility] on 07/21/2014. This over-read does not include interpretation of cardiac or coronary anatomy or pathology. The CTA interpretation by the cardiologist is attached.  FINDINGS: There are scattered areas of bronchial wall thickening with mild cylindrical bronchiectasis and thickening of the peribronchovascular interstitium, with associated architectural distortion and scarring, most evident in the inferior  segment of the lingula and in the right middle lobe. Other patchy areas of peribronchovascular micronodularity with a tree-in-bud appearance are noted in the lungs bilaterally, most evident in the periphery of the left upper lobe, most compatible with areas of mucoid impaction within terminal bronchioles. No other larger more suspicious appearing pulmonary nodules or masses are noted in the visualized portions of the thorax. Mildly enlarged 11 mm short axis low right paratracheal lymph node is noted, however, this has a normal appearing fatty hilum. Small hiatal hernia. Visualized portions of the upper abdomen are unremarkable. Median sternotomy wires. Aneurysmal dilatation of the ascending thoracic aorta, as described in the attached report. There are no aggressive appearing lytic or blastic lesions noted in the visualized portions of the skeleton.  IMPRESSION: 1. 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 Clinical correlation is recommended, with followup nonemergent high-resolution chest CT suggested in the near future to better evaluate these findings and establish a baseline for future followup examinations. 2. Small hiatal hernia.   Electronically Signed  By: Trudie Reed M.D.  On: 07/21/2014 10:24    CTA ABDOMEN AND PELVIS WITH CONTRAST  TECHNIQUE: Multidetector CT imaging of the abdomen and pelvis was performed using the standard protocol during bolus administration of intravenous contrast. Multiplanar reconstructed images and MIPs were obtained and reviewed to evaluate the vascular anatomy.  CONTRAST: 80mL OMNIPAQUE IOHEXOL 350 MG/ML SOLN  COMPARISON: None.  FINDINGS: CT ABDOMEN AND PELVIS FINDINGS  Lower chest: Small hiatal hernia. Otherwise, unremarkable.  Hepatobiliary: Common bile duct is severely dilated measuring up  to 21 mm in the porta hepatis. No definite ductal stone identified. No significant intrahepatic biliary ductal dilatation noted. Status post cholecystectomy. Sub cm low-attenuation lesion in segment 2 of the liver is too small to definitively characterize, but is statistically likely a tiny cyst. The liver has a slightly shrunken appearance and nodular contour, suggestive of underlying cirrhosis. No hypervascular hepatic lesion noted.  Pancreas: Unremarkable.  Spleen: Unremarkable.  Adrenals/Urinary Tract: Bilateral adrenal glands and bilateral kidneys are normal in appearance. No hydroureteronephrosis. Urinary bladder is normal in appearance.  Stomach/Bowel: Normal appearance of the stomach. No pathologic dilatation  of small bowel or colon. Numerous colonic diverticuli are noted, without surrounding inflammatory changes to suggest an acute diverticulitis at this time.  Vascular/Lymphatic: Extensive atherosclerosis throughout the abdominal and pelvic vasculature, with vascular findings and measurements pertinent to potential TAVR procedure, as detailed below. No lymphadenopathy noted in the abdomen or pelvis.  Reproductive: Status post hysterectomy. Ovaries are not confidently identified may be surgically absent or atrophic.  Other: No significant volume of ascites. No pneumoperitoneum. Small right inguinal hernia containing only fat incidentally noted.  Musculoskeletal: There are no aggressive appearing lytic or blastic lesions noted in the visualized portions of the skeleton.  VASCULAR MEASUREMENTS PERTINENT TO TAVR:  AORTA:  Minimal Aortic Diameter - 9 x 7 mm  Severity of Aortic Calcification - Severe  RIGHT PELVIS:  Right Common Iliac Artery -  Minimal Diameter - 6.4 x 8.2 mm  Tortuosity - Mild  Calcification - Moderate to severe  Right External Iliac Artery -  Minimal Diameter - 5.8 x 5.4 mm  Tortuosity - Mild  Calcification -  Mild  Right Common Femoral Artery -  Minimal Diameter - 5.3 x 4.7 mm  Tortuosity - Mild  Calcification - Mild  LEFT PELVIS:  Left Common Iliac Artery -  Minimal Diameter - 6.6 x 7.1 mm  Tortuosity - Mild  Calcification - Moderate to severe  Left External Iliac Artery -  Minimal Diameter - 5.8 x 4.3 mm  Tortuosity - Mild  Calcification - Mild  Left Common Femoral Artery -  Minimal Diameter - 6.1 x 4.6 mm  Tortuosity - Mild  Calcification - Mild  Review of the MIP images confirms the above findings.  IMPRESSION: 1. Vascular findings and measurements pertinent to potential TAVR procedure, as detailed above. This patient does not appear to have suitable pelvic arterial access. 2. Status post cholecystectomy. There is severe dilatation of the common bile duct measuring up to 21 mm in the porta hepatis. No definite ductal stone noted. No intrahepatic biliary ductal dilatation. This finding is of uncertain etiology and significance, but is favored to reflect post cholecystectomy physiology and advanced age. Clinical correlation for signs and symptoms of biliary tract obstruction is recommended, however. 3. Colonic diverticulosis without findings to suggest acute diverticulitis at this time. 4. Additional incidental findings, as above.   Electronically Signed  By: Trudie Reed M.D.  On: 07/27/2014 13:56    Transesophageal Echocardiography  (Report amended )  Patient:  Kailani, Brass MR #:    [id number] Study Date: 08/10/2014 Gender:   F Age:    56 Height:   157.5 cm Weight:   69.5 kg BSA:    1.76 m^2 Pt. Status: Room:  SONOGRAPHER Nolon Rod, RDCS ADMITTING  Tobias Alexander, M.D. ATTENDING  Tobias Alexander, M.D. ORDERING   Tobias Alexander, M.D. PERFORMING  Tobias Alexander, M.D.  cc:  ------------------------------------------------------------------- LV EF: 60% -   65%  ------------------------------------------------------------------- Indications:   Aortic stenosis 424.1.  ------------------------------------------------------------------- Study Conclusions  - Left ventricle: Wall thickness was increased in a pattern of moderate LVH. Systolic function was normal. The estimated ejection fraction was in the range of 60% to 65%. Wall motion was normal; there were no regional wall motion abnormalities. - Aortic valve: S/P Mitroflow Sorin valve replacement. The leaflets are severely thickened and calcified with almost fixed non-coronary and left coronary cusps and only limited motion of the right coronary cusp. There was severe stenosis. There was moderate regurgitation. Mean gradient (S): 35 mm Hg. Peak gradient (S): 79 mm Hg. - Aorta:  There was severe non-mobile atheroma. - Descending aorta: The descending aorta was normal in size. - Mitral valve: Structurally normal valve. There was mild regurgitation. - Left atrium: No evidence of thrombus in the atrial cavity or appendage. No evidence of thrombus in the atrial cavity or appendage. - Right ventricle: Systolic function was normal. - Right atrium: No evidence of thrombus in the atrial cavity or appendage. - Tricuspid valve: There was no significant regurgitation.  Impressions:  - S/P Mitroflow Sorin aortic valve replacement. The leaflets are severely thickened and calcified with almost fixed non-coronary and left coronary cusps and only limited motion of the right coronary cusp. There is severe aortic stenosis (gradients - peak 79 and mean 35 mmHg are off axis and therefore underestimated). There is small annulus measuring 18 mm. There is moderate aortic insufficiency.  Ascending aorta is moderately dilated measuring 48 mm.  Diagnostic transesophageal echocardiography. 2D and color Doppler. Birthdate: Patient birthdate: Oct 04, 1932. Age:  Patient is 81 yr old. Sex: Gender: female.  BMI: 28 kg/m^2. Blood pressure: 190/67 Patient status: Outpatient. Study date: Study date: 08/10/2014. Study time: 09:56 AM. Location: Endoscopy.  -------------------------------------------------------------------  ------------------------------------------------------------------- Left ventricle:  Wall thickness was increased in a pattern of moderate LVH.  Systolic function was normal. The estimated ejection fraction was in the range of 60% to 65%. Wall motion was normal; there were no regional wall motion abnormalities.  ------------------------------------------------------------------- Aortic valve: S/P Mitroflow Sorin valve replacement. The leaflets are severely thickened and calcified with almost fixed non-coronary and left coronary cusps and only limited motion of the right coronary cusp. Doppler:  There was severe stenosis.  There was moderate regurgitation.  Mean gradient (S): 35 mm Hg. Peak gradient (S): 79 mm Hg.  ------------------------------------------------------------------- Aorta: There was severe non-mobile atheroma. There was no evidence for dissection. Aortic root: The aortic root was not dilated. Ascending aorta: The ascending aorta was moderately dilated measuring 48 mm. Descending aorta: The descending aorta was normal in size.  ------------------------------------------------------------------- Mitral valve:  Structurally normal valve.  Leaflet separation was normal. Doppler: There was mild regurgitation.  ------------------------------------------------------------------- Left atrium: The atrium was normal in size. No evidence of thrombus in the atrial cavity or appendage. No evidence of thrombus in the atrial cavity or appendage. The appendage was morphologically a left appendage, multilobulated, and of normal size. Emptying velocity was  normal.  ------------------------------------------------------------------- Right ventricle: The cavity size was normal. Wall thickness was normal. Systolic function was normal.  ------------------------------------------------------------------- Pulmonic valve:  Structurally normal valve.  ------------------------------------------------------------------- Tricuspid valve:  Structurally normal valve.  Leaflet separation was normal. Doppler: There was no significant regurgitation.  ------------------------------------------------------------------- Pulmonary artery:  The main pulmonary artery was normal-sized.  ------------------------------------------------------------------- Right atrium: The atrium was normal in size. No evidence of thrombus in the atrial cavity or appendage. The appendage was morphologically a right appendage.  ------------------------------------------------------------------- Pericardium: There was no pericardial effusion.  ------------------------------------------------------------------- Measurements  Aortic valve            Value Aortic valve mean velocity, S   248  cm/s Aortic valve VTI, S        90.8 cm Aortic mean gradient, S      35  mm Hg Aortic peak gradient, S      79  mm Hg  Legend: (L) and (H) mark values outside specified reference range.  ------------------------------------------------------------------- Quay Burow, M.D. 2016-02-04T20:24:14   Impression:  The patient has stage D severe symptomatic aortic stenosis with severe prosthetic valve dysfunction, status post aortic valve  replacement using a bioprosthetic tissue valve in 2009. She presents with accelerating symptoms of exertional pain in her left shoulder consistent angina pectoris, currently functional class 3-4. The patient states that her symptoms are very similar to the symptoms she had prior to her  aortic valve replacement and coronary artery bypass grafting in 2009. The patient has severe three-vessel coronary artery disease involving the native coronary arteries, but all of her bypass grafts placed at the time of surgery in 2009 remain widely patent and free of significant disease. Left ventricular systolic function remains well-preserved. I have personally reviewed the patient's recent transesophageal echocardiogram with Dr Excell Seltzer and several other members of a multidisciplinary team of specialists. The patient has severe prosthetic valve dysfunction with calcification and somewhat restricted leaflet mobility involving at least 2 of the 3 leaflets of her valve. There is at least mild to moderate aortic insufficiency as well. The patient's aortic root and bioprosthetic tissue valve are relatively small in size, which further exacerbates the functional significance of its leaflet dysfunction and exacerbate the degree of stenosis. CT angiography demonstrates moderate aneurysmal enlargement of the ascending thoracic aorta with maximum transverse diameter 4.9 cm. Risks associated with conventional surgical redo aortic valve replacement would be at least moderately elevated because of the patient's advanced age and comorbid risk factors, and I feel that risks would likely be considerably higher than that predicted using the STS risk calculator because of the likelihood that aortic root replacement would be required at the time of surgery. We feel that transcatheter aortic valve replacement would be a reasonable alternative, although the relatively small size of her existing valve would mandate use of a relatively small transcatheter device (20 mm Sapien 3 transcatheter heart valve). This would clearly leave the patient with some degree of residual aortic stenosis, but the appearance of TEE suggests that TAVR might provide a significant improvement because of the high degree of leaflet restriction of her  current valve. CT angiography of the abdomen and pelvis demonstrate relatively small vessel diameters with severe calcification involving the infrarenal aorta, although the iliac vessels are relatively free of significant calcification or tortuosity. A transfemoral approach remains an option for transcatheter aortic valve replacement, although pelvic arterial access is clearly borderline.   Plan:  The patient and her husband were counseled at length regarding treatment alternatives for management of severe symptomatic aortic stenosis related to prosthetic valve dysfunction s/p aortic valve replacement with a bioprosthetic tissue valve that has failed. Alternative approaches such as redo conventional aortic valve replacement, transcatheter aortic valve replacement, and palliative medical therapy were compared and contrasted at length. The risks associated with redo conventional surgical aortic valve replacement were been discussed in detail, as were expectations for post-operative convalescence. Long-term prognosis with medical therapy was discussed. This discussion was placed in the context of the patient's own specific clinical presentation and past medical history. All of their questions been addressed. The patient desires to proceed with transcatheter aortic valve replacement as an alternative to high risk redo conventional surgical aortic valve replacement.  Following the decision to proceed with transcatheter aortic valve replacement, a discussion has been held regarding what types of management strategies would be attempted intraoperatively in the event of life-threatening complications, including whether or not the patient would be considered a candidate for the use of cardiopulmonary bypass and/or conversion to open sternotomy for attempted surgical intervention. The patient has been advised of a variety of complications that might develop including but not limited to risks of death,  stroke,  paravalvular leak, aortic dissection or other major vascular complications, aortic annulus rupture, device embolization, cardiac rupture or perforation, mitral regurgitation, acute myocardial infarction, arrhythmia, heart block or bradycardia requiring permanent pacemaker placement, congestive heart failure, respiratory failure, renal failure, pneumonia, infection, other late complications related to structural valve deterioration or migration, or other complications that might ultimately cause a temporary or permanent loss of functional independence or other long term morbidity. We tentatively plan to proceed with surgery on Tuesday, 09/05/2014. The patient understands that we would plan to attempt a transfemoral approach with a small possibility that we may need to convert to transapical approach if the patient's femoral vessels proved to be too small. The patient provides full informed consent for the procedure as described and all questions were answered.    Salvatore Decent. Cornelius Moras, MD 08/21/2014 2:19 PM

## 2014-09-05 NOTE — Anesthesia Preprocedure Evaluation (Addendum)
Anesthesia Evaluation  Patient identified by MRN, date of birth, ID band Patient awake    Reviewed: Allergy & Precautions, NPO status , Patient's Chart, lab work & pertinent test results  Airway Mallampati: I  TM Distance: >3 FB Neck ROM: Full    Dental  (+) Edentulous Upper, Edentulous Lower   Pulmonary shortness of breath,          Cardiovascular hypertension, Pt. on medications + CAD and + Peripheral Vascular Disease + Valvular Problems/Murmurs AS Rhythm:Regular Rate:Normal     Neuro/Psych    GI/Hepatic hiatal hernia, GERD-  Medicated and Controlled,  Endo/Other    Renal/GU Renal disease     Musculoskeletal  (+) Arthritis -,   Abdominal   Peds  Hematology   Anesthesia Other Findings   Reproductive/Obstetrics                          Anesthesia Physical Anesthesia Plan  ASA: IV  Anesthesia Plan: General   Post-op Pain Management:    Induction: Intravenous  Airway Management Planned: Oral ETT  Additional Equipment: Arterial line, CVP, TEE and Ultrasound Guidance Line Placement  Intra-op Plan:   Post-operative Plan: Extubation in OR  Informed Consent: I have reviewed the patients History and Physical, chart, labs and discussed the procedure including the risks, benefits and alternatives for the proposed anesthesia with the patient or authorized representative who has indicated his/her understanding and acceptance.     Plan Discussed with: CRNA and Surgeon  Anesthesia Plan Comments:         Anesthesia Quick Evaluation

## 2014-09-05 NOTE — Transfer of Care (Signed)
Immediate Anesthesia Transfer of Care Note  Patient: [name]  Procedure(s) Performed: Procedure(s): TRANSCATHETER AORTIC VALVE REPLACEMENT, TRANSFEMORAL (N/A) TRANSESOPHAGEAL ECHOCARDIOGRAM (TEE) (N/A)  Patient Location: ICU  Anesthesia Type:General  Level of Consciousness: lethargic and responds to stimulation  Airway & Oxygen Therapy: Patient Spontanous Breathing and Patient connected to nasal cannula oxygen  Post-op Assessment: Report given to RN  Post vital signs: Reviewed  Last Vitals:  Filed Vitals:   09/05/14 1544  BP:   Pulse: 50  Temp:   Resp:     Complications: No apparent anesthesia complications

## 2014-09-05 NOTE — Progress Notes (Signed)
  Echocardiogram Echocardiogram Transesophageal has been performed.  Arvil ChacoFoster, Naythan Douthit 09/05/2014, 4:19 PM

## 2014-09-06 ENCOUNTER — Encounter (HOSPITAL_COMMUNITY): Payer: Self-pay | Admitting: Cardiovascular Disease

## 2014-09-06 ENCOUNTER — Inpatient Hospital Stay (HOSPITAL_COMMUNITY): Payer: Medicare HMO

## 2014-09-06 DIAGNOSIS — Z954 Presence of other heart-valve replacement: Secondary | ICD-10-CM

## 2014-09-06 LAB — CBC
HCT: 24.8 % — ABNORMAL LOW (ref 36.0–46.0)
HEMATOCRIT: 23.5 % — AB (ref 36.0–46.0)
Hemoglobin: 8.2 g/dL — ABNORMAL LOW (ref 12.0–15.0)
Hemoglobin: 7.8 g/dL — ABNORMAL LOW (ref 12.0–15.0)
MCH: 28.2 pg (ref 26.0–34.0)
MCH: 28.3 pg (ref 26.0–34.0)
MCHC: 33.1 g/dL (ref 30.0–36.0)
MCHC: 33.2 g/dL (ref 30.0–36.0)
MCV: 85.2 fL (ref 78.0–100.0)
MCV: 85.1 fL (ref 78.0–100.0)
PLATELETS: 117 10*3/uL — AB (ref 150–400)
Platelets: 103 10*3/uL — ABNORMAL LOW (ref 150–400)
RBC: 2.91 MIL/uL — ABNORMAL LOW (ref 3.87–5.11)
RBC: 2.76 MIL/uL — ABNORMAL LOW (ref 3.87–5.11)
RDW: 14.2 % (ref 11.5–15.5)
RDW: 14.2 % (ref 11.5–15.5)
WBC: 7.5 10*3/uL (ref 4.0–10.5)
WBC: 7 10*3/uL (ref 4.0–10.5)

## 2014-09-06 LAB — GLUCOSE, CAPILLARY
GLUCOSE-CAPILLARY: 206 mg/dL — AB (ref 70–99)
GLUCOSE-CAPILLARY: 165 mg/dL — AB (ref 70–99)
GLUCOSE-CAPILLARY: 128 mg/dL — AB (ref 70–99)
GLUCOSE-CAPILLARY: 114 mg/dL — AB (ref 70–99)
GLUCOSE-CAPILLARY: 130 mg/dL — AB (ref 70–99)
Glucose-Capillary: 100 mg/dL — ABNORMAL HIGH (ref 70–99)
Glucose-Capillary: 106 mg/dL — ABNORMAL HIGH (ref 70–99)
Glucose-Capillary: 112 mg/dL — ABNORMAL HIGH (ref 70–99)
Glucose-Capillary: 113 mg/dL — ABNORMAL HIGH (ref 70–99)
Glucose-Capillary: 125 mg/dL — ABNORMAL HIGH (ref 70–99)
Glucose-Capillary: 129 mg/dL — ABNORMAL HIGH (ref 70–99)
Glucose-Capillary: 135 mg/dL — ABNORMAL HIGH (ref 70–99)
Glucose-Capillary: 136 mg/dL — ABNORMAL HIGH (ref 70–99)
Glucose-Capillary: 138 mg/dL — ABNORMAL HIGH (ref 70–99)
Glucose-Capillary: 144 mg/dL — ABNORMAL HIGH (ref 70–99)
Glucose-Capillary: 144 mg/dL — ABNORMAL HIGH (ref 70–99)
Glucose-Capillary: 166 mg/dL — ABNORMAL HIGH (ref 70–99)
Glucose-Capillary: 94 mg/dL (ref 70–99)
Glucose-Capillary: 95 mg/dL (ref 70–99)

## 2014-09-06 LAB — BASIC METABOLIC PANEL
ANION GAP: 8 (ref 5–15)
BUN: 14 mg/dL (ref 6–23)
CHLORIDE: 101 mmol/L (ref 96–112)
CO2: 26 mmol/L (ref 19–32)
Calcium: 8.6 mg/dL (ref 8.4–10.5)
Creatinine, Ser: 1.23 mg/dL — ABNORMAL HIGH (ref 0.50–1.10)
GFR calc Af Amer: 47 mL/min — ABNORMAL LOW (ref >90)
GFR calc non Af Amer: 41 mL/min — ABNORMAL LOW (ref >90)
GLUCOSE: 123 mg/dL — AB (ref 70–99)
POTASSIUM: 3.5 mmol/L (ref 3.5–5.1)
Sodium: 135 mmol/L (ref 135–145)

## 2014-09-06 LAB — MAGNESIUM: MAGNESIUM: 1.6 mg/dL (ref 1.5–2.5)

## 2014-09-06 MED ORDER — METOCLOPRAMIDE HCL 5 MG/ML IJ SOLN
10.0000 mg | Freq: Four times a day (QID) | INTRAMUSCULAR | Status: DC
  Administered 2014-09-06 – 2014-09-08 (×5): 10 mg via INTRAVENOUS
  Filled 2014-09-06 (×11): qty 2

## 2014-09-06 MED ORDER — ATORVASTATIN CALCIUM 80 MG PO TABS
80.0000 mg | ORAL_TABLET | Freq: Every day | ORAL | Status: DC
  Administered 2014-09-06 – 2014-09-07 (×2): 80 mg via ORAL
  Filled 2014-09-06 (×3): qty 1

## 2014-09-06 MED ORDER — ASPIRIN 81 MG PO TABS: 81.0000 mg | ORAL_TABLET | Freq: Every day | ORAL | Status: DC

## 2014-09-06 MED ORDER — MOVING RIGHT ALONG BOOK
Freq: Once | Status: AC
  Administered 2014-09-06: 15:00:00
  Filled 2014-09-06: qty 1

## 2014-09-06 MED ORDER — POTASSIUM CHLORIDE 10 MEQ/50ML IV SOLN
10.0000 meq | INTRAVENOUS | Status: AC
  Administered 2014-09-06 (×2): 10 meq via INTRAVENOUS
  Filled 2014-09-06 (×2): qty 50

## 2014-09-06 MED ORDER — BENAZEPRIL HCL 20 MG PO TABS
20.0000 mg | ORAL_TABLET | Freq: Every day | ORAL | Status: DC
  Administered 2014-09-06 – 2014-09-08 (×3): 20 mg via ORAL
  Filled 2014-09-06 (×3): qty 1

## 2014-09-06 MED ORDER — SODIUM CHLORIDE 0.9 % IV SOLN: 250.0000 mL | INTRAVENOUS | Status: DC | PRN

## 2014-09-06 MED ORDER — ASPIRIN EC 81 MG PO TBEC
81.0000 mg | DELAYED_RELEASE_TABLET | Freq: Every day | ORAL | Status: DC
  Administered 2014-09-07 – 2014-09-08 (×2): 81 mg via ORAL
  Filled 2014-09-06 (×2): qty 1

## 2014-09-06 MED ORDER — SODIUM CHLORIDE 0.9 % IJ SOLN: 3.0000 mL | INTRAMUSCULAR | Status: DC | PRN

## 2014-09-06 MED ORDER — METOPROLOL TARTRATE 12.5 MG HALF TABLET
12.5000 mg | ORAL_TABLET | Freq: Two times a day (BID) | ORAL | Status: DC
Start: 2014-09-06 — End: 2014-09-08
  Administered 2014-09-06 – 2014-09-08 (×4): 12.5 mg via ORAL
  Filled 2014-09-06 (×6): qty 1

## 2014-09-06 MED ORDER — SODIUM CHLORIDE 0.9 % IJ SOLN
3.0000 mL | Freq: Two times a day (BID) | INTRAMUSCULAR | Status: DC
Start: 2014-09-06 — End: 2014-09-08
  Administered 2014-09-07: 3 mL via INTRAVENOUS

## 2014-09-06 MED ORDER — ATORVASTATIN CALCIUM 80 MG PO TABS: 80.0000 mg | ORAL_TABLET | Freq: Every day | ORAL | Status: DC

## 2014-09-06 MED ORDER — FUROSEMIDE 10 MG/ML IJ SOLN
40.0000 mg | Freq: Once | INTRAMUSCULAR | Status: AC
  Administered 2014-09-06: 40 mg via INTRAVENOUS
  Filled 2014-09-06: qty 4

## 2014-09-06 MED ORDER — INSULIN ASPART 100 UNIT/ML ~~LOC~~ SOLN
0.0000 [IU] | SUBCUTANEOUS | Status: DC
  Administered 2014-09-06: 4 [IU] via SUBCUTANEOUS
  Administered 2014-09-06 (×2): 2 [IU] via SUBCUTANEOUS

## 2014-09-06 MED ORDER — POTASSIUM CHLORIDE 10 MEQ/50ML IV SOLN
10.0000 meq | INTRAVENOUS | Status: DC
  Administered 2014-09-06 (×3): 10 meq via INTRAVENOUS
  Filled 2014-09-06: qty 50

## 2014-09-06 MED FILL — Magnesium Sulfate Inj 50%: INTRAMUSCULAR | Qty: 10 | Status: AC

## 2014-09-06 MED FILL — Heparin Sodium (Porcine) Inj 1000 Unit/ML: INTRAMUSCULAR | Qty: 30 | Status: AC

## 2014-09-06 MED FILL — Insulin Regular (Human) Inj 100 Unit/ML: INTRAMUSCULAR | Qty: 2.5 | Status: AC

## 2014-09-06 MED FILL — Potassium Chloride Inj 2 mEq/ML: INTRAVENOUS | Qty: 40 | Status: AC

## 2014-09-06 NOTE — Progress Notes (Signed)
    Subjective:  Pt seen this am. Had nausea and vomiting at time of evaluation but no other complaints.   Objective:  Vital Signs in the last 24 hours: Temp:  [95.9 F (35.5 C)-98.5 F (36.9 C)] 98.5 F (36.9 C) (03/02 1500) Pulse Rate:  [54-86] 70 (03/02 1500) Resp:  [10-22] 14 (03/02 1500) BP: (100-135)/(49-81) 107/55 mmHg (03/02 1500) SpO2:  [94 %-100 %] 96 % (03/02 1500) Arterial Line BP: (114-169)/(46-77) 134/52 mmHg (03/02 1500) FiO2 (%):  [40 %] 40 % (03/01 2100) Weight:  [151 lb 0.2 oz (68.5 kg)] 151 lb 0.2 oz (68.5 kg) (03/02 0500)  Intake/Output from previous day: 03/01 0701 - 03/02 0700 In: 3322.4 [I.V.:2922.4; IV Piggyback:400] Out: 3200 [Urine:3200]  Physical Exam: Pt is alert and oriented, elderly woman in NAD HEENT: normal Neck: JVP - normal Lungs: CTA bilaterally CV: RRR with 2/6 systolic murmur at RUSB Abd: soft, NT, Positive BS, no hepatomegaly Ext: no C/C/E, distal pulses intact and equal Skin: warm/dry no rash   Lab Results:  Recent Labs  09/05/14 2308 09/05/14 2328 09/06/14 0415  WBC 7.5  --  7.0  HGB 8.2* 9.2* 7.8*  PLT 117*  --  103*    Recent Labs  09/05/14 2328 09/06/14 0415  NA 137 135  K 3.8 3.5  CL 101 101  CO2  --  26  GLUCOSE 110* 123*  BUN 17 14  CREATININE 1.10 1.23*   No results for input(s): TROPONINI in the last 72 hours.  Invalid input(s): CK, MB  Cardiac Studies: 2D Echo: pending  Assessment/Plan:  1. S/P TAVR POD #1 2. CAD S/P CABG without angina 3. Anemia, expected post-op blood loss 4. HTN  Plan as outlined by Dr Cornelius Moraswen. Oral antihypertensives restarted. Wean off NTG drip. Pt on ASA and plavix. Await 2D Echo. Watch H/H.   Tonny BollmanMichael Dannel Rafter, M.D. 09/06/2014, 5:43 PM

## 2014-09-06 NOTE — Progress Notes (Addendum)
[address]       [place] [address]             [phone]        CARDIOTHORACIC SURGERY PROGRESS NOTE   R1 Day Post-Op Procedure(s) (LRB): TRANSCATHETER AORTIC VALVE REPLACEMENT, TRANSFEMORAL (N/A) TRANSESOPHAGEAL ECHOCARDIOGRAM (TEE) (N/A)  Subjective: Feels nauseated.  No pain.  Denies SOB.  Objective: Vital signs: BP Readings from Last 1 Encounters:  09/06/14 121/51   Pulse Readings from Last 1 Encounters:  09/06/14 75   Resp Readings from Last 1 Encounters:  09/06/14 13   Temp Readings from Last 1 Encounters:  09/06/14 97.3 F (36.3 C)     Hemodynamics: PAP: (19-52)/(7-26) 36/14 mmHg CVP:  [27 mmHg] 27 mmHg CO:  [3.3 L/min-4.7 L/min] 4.7 L/min CI:  [1.9 L/min/m2-2.8 L/min/m2] 2.8 L/min/m2  Physical Exam:  Rhythm:   sinus  Breath sounds: clear  Heart sounds:  RRR w/ systolic murmur  Incisions:  Clean and dry  Abdomen:  Soft, non-distended, non-tender  Extremities:  Warm, well-perfused    Intake/Output from previous day: 03/01 0701 - 03/02 0700 In: 3322.4 [I.V.:2922.4; IV Piggyback:400] Out: 3200 [Urine:3200] Intake/Output this shift:    Lab Results:  CBC: Recent Labs  09/05/14 2308 09/05/14 2328 09/06/14 0415  WBC 7.5  --  7.0  HGB 8.2* 9.2* 7.8*  HCT 24.8* 27.0* 23.5*  PLT 117*  --  103*    BMET:  Recent Labs  09/05/14 2328 09/06/14 0415  NA 137 135  K 3.8 3.5  CL 101 101  CO2  --  26  GLUCOSE 110* 123*  BUN 17 14  CREATININE 1.10 1.23*  CALCIUM  --  8.6     CBG (last 3)   Recent Labs  09/06/14 0312 09/06/14 0418 09/06/14 0523  GLUCAP 144* 125* 106*    ABG    Component Value Date/Time   PHART 7.328* 09/05/2014 1906   PCO2ART 49.2* 09/05/2014 1906   PO2ART 143.0* 09/05/2014 1906   HCO3 26.1* 09/05/2014 1906   TCO2 23 09/05/2014 2328   ACIDBASEDEF 4.0* 09/05/2014 1727   O2SAT 99.0 09/05/2014 1906    CXR: PORTABLE CHEST - 1 VIEW  COMPARISON: 09/05/2014.  FINDINGS: Swan-Ganz  catheter stable position. Mediastinum and hilar structures are unremarkable. Prior CABG and aortic valve replacement. Stable cardiomegaly. No pulmonary venous congestion. Interval clearing of interstitial prominence and left base atelectasis. No pleural effusion or pneumothorax. Apical pleural thickening consistent with scarring. No acute osseus abnormality.  IMPRESSION: 1. Swan-Ganz catheter in stable position. 2. Prior CABG and aortic valve replacement. Heart size is stable. No pulmonary venous congestion. No evidence of CHF on today's exam. 3. Interval clearing of left base atelectasis.   Electronically Signed  By: Maisie Fus Register  On: 09/06/2014 07:53   Assessment/Plan: S/P Procedure(s) (LRB): TRANSCATHETER AORTIC VALVE REPLACEMENT, TRANSFEMORAL (N/A) TRANSESOPHAGEAL ECHOCARDIOGRAM (TEE) (N/A)  Overall stable POD1 Maintaining NSR w/ stable hemodynamics although still on fairly high dose NTG for hypertension Expected post op acute blood loss anemia, Hgb down to 7.8 this am   Mobilize  Gentle diuresis  Watch Hgb/Hct for now  Restart antihypertensive Rx and wean NTG off  ASA + Plavix  Routine postop ECHO  Transfer step down once BP stable off NTG  I spent in excess of 15 minutes during the conduct of this hospital encounter and >50% of this time involved direct face-to-face encounter with the patient for counseling and/or coordination of their care.    Catori Panozzo  H 09/06/2014 8:53 AM

## 2014-09-06 NOTE — Progress Notes (Signed)
Utilization Review Completed.  

## 2014-09-06 NOTE — Progress Notes (Signed)
[facility] BRIEF SICU PROGRESS NOTE  1 Day Post-Op  S/P Procedure(s) (LRB): TRANSCATHETER AORTIC VALVE REPLACEMENT, TRANSFEMORAL (N/A) TRANSESOPHAGEAL ECHOCARDIOGRAM (TEE) (N/A)   Doing very well this evening NSR w/ stable BP off NTG drip all afternoon Just ate supper.  Nausea resolved Awaiting bed on 2W for transfer  Plan: Continue routine care.  Possible d/c home tomorrow or Friday  Purcell NailsOWEN,CLARENCE H 09/06/2014 6:37 PM

## 2014-09-06 NOTE — Clinical Documentation Improvement (Signed)
  Please clarify renal status. Thank you  . Document the stage of CKD --Chronic kidney disease, stage 1- GFR > OR = 90 --Chronic kidney disease, stage 2 (mild) - GFR 60-89 --Chronic kidney disease, stage 3 (moderate) - GFR 30-59 --Chronic kidney disease, stage 4 (severe) - GFR 15-29 --Chronic kidney disease, stage 5- GFR < 15 --End-stage renal disease (ESRD) . Document any underlying cause of CKD such as Diabetes or Hypertension  . Chronic renal failure without a documented stage will be assigned to Chronic kidney disease, unspecified . Document any associated diagnoses/conditions  Supporting Information:   Past Medical History Chronic kidney disease,Hypertension, and CAD  Component     Latest Ref Rng 09/05/2014 09/05/2014 09/05/2014 09/05/2014 09/05/2014         2:47 PM  3:30 PM  4:15 PM  5:20 PM 11:08 PM  BUN     6 - 23 mg/dL 18 18 17     Creatinine     0.50 - 1.10 mg/dL 1.611.00 0.961.00 0.450.90  4.091.21 (H)         GFR calc non Af Amer     >90 mL/min     41 (L)   Component     Latest Ref Rng 09/05/2014 09/06/2014        11:28 PM   BUN     6 - 23 mg/dL 17 14  Creatinine     8.110.50 - 1.10 mg/dL 9.141.10 7.821.23 (H)      GFR calc non Af Amer     >90 mL/min  41 (L)   Treatments Monitoring Daily weights, I&O and BMPs IV saline locked  Thank You,   Kingson Lohmeyer T. Luiz OchoaWilliams RN, MSN, MBA/MHA Clinical Documentation Specialist [email or web address] Office # [phone]

## 2014-09-07 ENCOUNTER — Other Ambulatory Visit: Payer: Self-pay | Admitting: *Deleted

## 2014-09-07 DIAGNOSIS — I350 Nonrheumatic aortic (valve) stenosis: Secondary | ICD-10-CM

## 2014-09-07 LAB — CBC
HCT: 27.2 % — ABNORMAL LOW (ref 36.0–46.0)
Hemoglobin: 8.8 g/dL — ABNORMAL LOW (ref 12.0–15.0)
MCH: 28 pg (ref 26.0–34.0)
MCHC: 32.4 g/dL (ref 30.0–36.0)
MCV: 86.6 fL (ref 78.0–100.0)
PLATELETS: 108 10*3/uL — AB (ref 150–400)
RBC: 3.14 MIL/uL — AB (ref 3.87–5.11)
RDW: 14.3 % (ref 11.5–15.5)
WBC: 7.4 10*3/uL (ref 4.0–10.5)

## 2014-09-07 LAB — BASIC METABOLIC PANEL
ANION GAP: 9 (ref 5–15)
BUN: 16 mg/dL (ref 6–23)
CO2: 26 mmol/L (ref 19–32)
Calcium: 9.1 mg/dL (ref 8.4–10.5)
Chloride: 101 mmol/L (ref 96–112)
Creatinine, Ser: 1.46 mg/dL — ABNORMAL HIGH (ref 0.50–1.10)
GFR calc Af Amer: 38 mL/min — ABNORMAL LOW (ref >90)
GFR calc non Af Amer: 33 mL/min — ABNORMAL LOW (ref >90)
Glucose, Bld: 116 mg/dL — ABNORMAL HIGH (ref 70–99)
Potassium: 3.5 mmol/L (ref 3.5–5.1)
SODIUM: 136 mmol/L (ref 135–145)

## 2014-09-07 LAB — GLUCOSE, CAPILLARY
GLUCOSE-CAPILLARY: 109 mg/dL — AB (ref 70–99)
Glucose-Capillary: 115 mg/dL — ABNORMAL HIGH (ref 70–99)
Glucose-Capillary: 105 mg/dL — ABNORMAL HIGH (ref 70–99)
Glucose-Capillary: 130 mg/dL — ABNORMAL HIGH (ref 70–99)
Glucose-Capillary: 109 mg/dL — ABNORMAL HIGH (ref 70–99)

## 2014-09-07 MED ORDER — POTASSIUM CHLORIDE CRYS ER 20 MEQ PO TBCR
40.0000 meq | EXTENDED_RELEASE_TABLET | Freq: Once | ORAL | Status: AC
  Administered 2014-09-07: 40 meq via ORAL
  Filled 2014-09-07: qty 2

## 2014-09-07 MED ORDER — HYDROCHLOROTHIAZIDE 25 MG PO TABS
25.0000 mg | ORAL_TABLET | Freq: Every day | ORAL | Status: DC
  Administered 2014-09-07 – 2014-09-08 (×2): 25 mg via ORAL
  Filled 2014-09-07 (×2): qty 1

## 2014-09-07 NOTE — Progress Notes (Addendum)
      [address]       [place] [id number]             [phone]        CARDIOTHORACIC SURGERY PROGRESS NOTE   R2 Days Post-Op Procedure(s) (LRB): TRANSCATHETER AORTIC VALVE REPLACEMENT, TRANSFEMORAL (N/A) TRANSESOPHAGEAL ECHOCARDIOGRAM (TEE) (N/A)  Subjective: No complaints except mild soreness medial aspect right thigh associated w/ surgical incision.  Feels well.  Hopes to go home soon.   Objective: Vital signs: BP Readings from Last 1 Encounters:  09/07/14 124/53   Pulse Readings from Last 1 Encounters:  09/07/14 80   Resp Readings from Last 1 Encounters:  09/07/14 30   Temp Readings from Last 1 Encounters:  09/07/14 98.2 F (36.8 C) Oral    Hemodynamics: PAP: (32-37)/(14) 32/14 mmHg CO:  [4.3 L/min] 4.3 L/min CI:  [2.6 L/min/m2] 2.6 L/min/m2  Physical Exam:  Rhythm:   sinus  Breath sounds: clear  Heart sounds:  RRR w/ III/VI systolic murmur  Incisions:  Clean and dry  Abdomen:  Soft, non-distended, non-tender  Extremities:  Warm, well-perfused    Intake/Output from previous day: 03/02 0701 - 03/03 0700 In: 420 [P.O.:120; IV Piggyback:300] Out: 3275 [Urine:3275] Intake/Output this shift:    Lab Results:  CBC: Recent Labs  09/06/14 0415 09/07/14 0249  WBC 7.0 7.4  HGB 7.8* 8.8*  HCT 23.5* 27.2*  PLT 103* 108*    BMET:  Recent Labs  09/06/14 0415 09/07/14 0249  NA 135 136  K 3.5 3.5  CL 101 101  CO2 26 26  GLUCOSE 123* 116*  BUN 14 16  CREATININE 1.23* 1.46*  CALCIUM 8.6 9.1     CBG (last 3)   Recent Labs  09/06/14 1926 09/06/14 2329 09/07/14 0344  GLUCAP 166* 100* 115*    ABG    Component Value Date/Time   PHART 7.328* 09/05/2014 1906   PCO2ART 49.2* 09/05/2014 1906   PO2ART 143.0* 09/05/2014 1906   HCO3 26.1* 09/05/2014 1906   TCO2 23 09/05/2014 2328   ACIDBASEDEF 4.0* 09/05/2014 1727   O2SAT 99.0 09/05/2014 1906    CXR: N/A    Assessment/Plan: S/P Procedure(s) (LRB): TRANSCATHETER  AORTIC VALVE REPLACEMENT, TRANSFEMORAL (N/A) TRANSESOPHAGEAL ECHOCARDIOGRAM (TEE) (N/A)  Doing very well POD2 Maintaining NSR w/ stable BP Expected post op acute blood loss anemia, Hgb up 8.8 today   Mobilize  Resume home meds  ASA + Plavix  Possible d/c home later today or tomorrow depending on how she does getting up and mobile  I spent in excess of 15 minutes during the conduct of this hospital encounter and >50% of this time involved direct face-to-face encounter with the patient for counseling and/or coordination of their care.   Mila Pair H 09/07/2014 7:56 AM

## 2014-09-08 DIAGNOSIS — I350 Nonrheumatic aortic (valve) stenosis: Secondary | ICD-10-CM

## 2014-09-08 LAB — GLUCOSE, CAPILLARY
Glucose-Capillary: 102 mg/dL — ABNORMAL HIGH (ref 70–99)
Glucose-Capillary: 114 mg/dL — ABNORMAL HIGH (ref 70–99)

## 2014-09-08 MED ORDER — CLOPIDOGREL BISULFATE 75 MG PO TABS: 75.0000 mg | ORAL_TABLET | Freq: Every day | ORAL | Status: DC

## 2014-09-08 MED ORDER — METOPROLOL TARTRATE 25 MG PO TABS: 12.5000 mg | ORAL_TABLET | Freq: Two times a day (BID) | ORAL | Status: DC

## 2014-09-08 MED ORDER — TRAMADOL HCL 50 MG PO TABS: 50.0000 mg | ORAL_TABLET | ORAL | Status: DC | PRN

## 2014-09-08 NOTE — Progress Notes (Signed)
       [address]       [place] [id number]             [phone]          3 Days Post-Op Procedure(s) (LRB): TRANSCATHETER AORTIC VALVE REPLACEMENT, TRANSFEMORAL (N/A) TRANSESOPHAGEAL ECHOCARDIOGRAM (TEE) (N/A)  Subjective: Feels well.  Groin sore, but otherwise stable.  Just had echo done.   Objective: Vital signs in last 24 hours: Patient Vitals for the past 24 hrs:  BP Temp Temp src Pulse Resp SpO2 Weight  09/08/14 0619 (!) 133/50 mmHg 97.7 F (36.5 C) Oral 70 18 98 % 148 lb 8 oz (67.359 kg)  09/07/14 2152 (!) 155/70 mmHg 98.2 F (36.8 C) Oral 80 18 98 % -  09/07/14 1556 (!) 164/56 mmHg 98.3 F (36.8 C) Oral 68 18 96 % -  09/07/14 1400 (!) 164/54 mmHg - - 82 15 97 % -  09/07/14 1300 (!) 136/50 mmHg - - 73 (!) 22 98 % -  09/07/14 1200 (!) 132/49 mmHg - - 70 (!) 26 97 % -  09/07/14 1100 (!) 144/57 mmHg - - 72 15 98 % -  09/07/14 1000 133/63 mmHg - - 78 (!) 21 97 % -   Current Weight  09/08/14 148 lb 8 oz (67.359 kg)     Intake/Output from previous day: 03/03 0701 - 03/04 0700 In: 290 [P.O.:240; IV Piggyback:50] Out: 1900 [Urine:1900]    PHYSICAL EXAM:  Heart: RRR Lungs: Clear Wound: Groin clean and dry, small hematoma present but soft Extremities: No edema    Lab Results: CBC: Recent Labs  09/06/14 0415 09/07/14 0249  WBC 7.0 7.4  HGB 7.8* 8.8*  HCT 23.5* 27.2*  PLT 103* 108*   BMET:  Recent Labs  09/06/14 0415 09/07/14 0249  NA 135 136  K 3.5 3.5  CL 101 101  CO2 26 26  GLUCOSE 123* 116*  BUN 14 16  CREATININE 1.23* 1.46*  CALCIUM 8.6 9.1    PT/INR:  Recent Labs  09/05/14 1725  LABPROT 15.6*  INR 1.23      Assessment/Plan: S/P Procedure(s) (LRB): TRANSCATHETER AORTIC VALVE REPLACEMENT, TRANSFEMORAL (N/A) TRANSESOPHAGEAL ECHOCARDIOGRAM (TEE) (N/A) Doing well overall.   Follow up on am echo, then plan d/c home later today.   LOS: 3 days    Dynesha Woolen H 09/08/2014

## 2014-09-08 NOTE — Discharge Instructions (Signed)
You may resume your regular diet and activity as tolerated. You may shower and clean incision with soap and water. Please call our office [phone]) if you develop redness, swelling or drainage from your incision, chest pain, shortness of breath, or increased leg swelling, or fever >101.

## 2014-09-08 NOTE — Progress Notes (Signed)
CARDIAC REHAB PHASE I   PRE:  Rate/Rhythm: 82 SR    BP: sitting 145/48    SaO2: 98 RA  MODE:  Ambulation: 550 ft   POST:  Rate/Rhythm: 98 SR    BP: sitting 158/45     SaO2: 98 RA  Tolerated very well, no c/o. Independently steady, long distance. Gave pt ex gl for walking at home. Not interested in CRPII as she wants to go to her local gym.  [phone]  [name], [name] CES, ACSM 09/08/2014 10:19 AM

## 2014-09-08 NOTE — Plan of Care (Signed)
Problem: Phase III - Recovery through Discharge Goal: Activity Progressed Patient ambulates in room without difficulty or complaint of SOB.  All incisions look clean without signs of infection, or are dressed appropriately.  Patient denies pain, and is tolerating diet and activity well.

## 2014-09-08 NOTE — Progress Notes (Signed)
Discharge education completed by RN. Pt and spouse received a copy of discharge paperwork and confirm understanding of follow up appointments and discharge medications. Both deny any questions at this time. IV removed, site is within normal limits. Pt will discharge from the unit via wheelchair. 

## 2014-09-08 NOTE — Progress Notes (Signed)
  Echocardiogram 2D Echocardiogram has been performed.  [name], [name] 09/08/2014, 9:50 AM

## 2014-09-08 NOTE — Progress Notes (Signed)
     SUBJECTIVE: No chest pain or SOB. Right groin sore.   BP 133/50 mmHg  Pulse 70  Temp(Src) 97.7 F (36.5 C) (Oral)  Resp 18  Ht 5\' 1"  (1.549 m)  Wt 148 lb 8 oz (67.359 kg)  BMI 28.07 kg/m2  SpO2 98%  Intake/Output Summary (Last 24 hours) at 09/08/14 [id number] Last data filed at 09/07/14 2157  Gross per 24 hour  Intake    290 ml  Output   1900 ml  Net  -1610 ml    PHYSICAL EXAM General: Well developed, well nourished, in no acute distress. Alert and oriented x 3.  Psych:  Good affect, responds appropriately Neck: No JVD. No masses noted.  Lungs: Clear bilaterally with no wheezes or rhonci noted.  Heart: RRR with no murmurs noted. Abdomen: Bowel sounds are present. Soft, non-tender.  Extremities: No lower extremity edema.   LABS: Basic Metabolic Panel:  Recent Labs  96/10/5401/01/16 2308  09/06/14 0415 09/07/14 0249  NA  --   < > 135 136  K  --   < > 3.5 3.5  CL  --   < > 101 101  CO2  --   --  26 26  GLUCOSE  --   < > 123* 116*  BUN  --   < > 14 16  CREATININE 1.21*  < > 1.23* 1.46*  CALCIUM  --   --  8.6 9.1  MG 1.6  --  1.6  --   < > = values in this interval not displayed. CBC:  Recent Labs  09/06/14 0415 09/07/14 0249  WBC 7.0 7.4  HGB 7.8* 8.8*  HCT 23.5* 27.2*  MCV 85.1 86.6  PLT 103* 108*   Current Meds: . acetaminophen  1,000 mg Oral 4 times per day  . aspirin EC  81 mg Oral Daily  . atorvastatin  80 mg Oral q1800  . benazepril  20 mg Oral Daily  . Chlorhexidine Gluconate Cloth  6 each Topical Q0600  . clopidogrel  75 mg Oral Q breakfast  . hydrochlorothiazide  25 mg Oral Daily  . metoCLOPramide (REGLAN) injection  10 mg Intravenous 4 times per day  . metoprolol tartrate  12.5 mg Oral BID  . mupirocin ointment  1 application Nasal BID  . pantoprazole  40 mg Oral Daily  . sodium chloride  3 mL Intravenous Q12H  . timolol  1 drop Both Eyes BID    ASSESSMENT AND PLAN:  1. Severe stenosis of the bioprosthetic aortic valve: POD #3 s/p TAVR.  Doing well. Echo to be completed this am. I checked with the echo staff and they have her on their list for this am. Continue ASA and Plavix. Discharge home today. She will need f/u in 1-2 weeks with office APP and then f/u in one month with Dr. Excell Seltzerooper.    MCALHANY,CHRISTOPHER  3/4/20166:48 AM

## 2014-09-08 NOTE — Discharge Summary (Signed)
[address]       [place] [id number]             [phone]              Discharge Summary  Name: [name] DOB: 07/08/1932 81 y.o. MRN: [id number]   Admission Date: 09/05/2014 Discharge Date: 09/08/2014    Admitting Diagnosis: Severe aortic stenosis Prosthetic valve dysfunction   Discharge Diagnosis:  Severe aortic stenosis Prosthetic valve dysfunction Expected postop blood loss anemia   Past Medical History  Diagnosis Date  . Aortic stenosis     a.  s/p tissue AVR at time of CABG in 2009;  b. Echo 04/2012: EF 55-60%, moderate AS (mean 34);  c. Echo 6/14: Mild LVH, mild focal basal septal hypertrophy, EF 55-60%, normal wall motion, grade 2 diastolic dysfunction, AVR with moderate aortic stenosis (mean 36) - consider TEE if clinically indicated, mild AI, mild MR, PASP 44, ascending aorta mild to moderately dilated (consider CTA or MRA)  . Dyslipidemia   . Hypertension   . Spinal arthritis   . CAD (coronary artery disease)     a. s/p CABG; b. Myoview 06/2011: No ischemia, EF 67%;  c. 01/2013 Cath: LM min irregs, LAD small, LCX 17349m OMs ok, RCA known 100, VG->RCA ok, VG->OM1->2 ok, LIMA->LAD ok.  Marland Kitchen. Hx of CABG     LIMA-LAD, SVG-RCA, SVG-OM1/OM2 in 2009  . Blindness of right eye     due to retinal bleed  . Glaucoma   . Left bundle branch block   . Heart murmur   . Chronic kidney disease     renal insufficiency  . GERD (gastroesophageal reflux disease)   . Arthritis   . Blind right eye   . HOH (hard of hearing)   . S/P aortic valve replacement with bioprosthetic valve 07/28/2007    #221mm Sorin Mitroflow pericardial tissue valve    . S/P CABG x 4 07/28/2007    LIMA to LAD, SVG to RCA, sequential SVG to OM1-OM2  . Unstable angina 01/14/2013  . Aortic stenosis, severe 07/03/2014    Recurrent, due to prosthetic valve stenosis   . Prosthetic valve dysfunction   . Renal insufficiency   . PONV (postoperative nausea and vomiting)     nausea   yrs ago  . Carotid stenosis     Carotid U/S 5/13:  bilat 40-59%  . Occlusion and stenosis of carotid artery without mention of cerebral infarction 11/28/2011  . Shortness of breath     occ  . History of hiatal hernia   . Cancer     ovarian?  . S/P TAVR (transcatheter aortic valve replacement) 09/05/2014    20 mm Edwards Sapien 3 transcatheter heart valve placed via open right transfemoral approach for valve-in-valve replacement for prosthetic valve dysfunction     Procedures: Transcatheter Aortic Valve Replacement - Valve-in-Valve - Open Right Transfemoral Approach - 09/05/2014  Stephannie PetersEdwards Sapien 3 Transcatheter Heart Valve (size 20 mm, model # 9600TFX, serial # [id number])    HPI:  The patient is a 81 y.o. female with a history of aortic stenosis and coronary artery disease who is status post aortic valve replacement using a 21 mm Sorin Mitroflow bioprosthetic tissue valve and coronary artery bypass grafting 4 by Dr. Dorris FetchHendrickson in 2009. Findings at the time of surgery were notable for the presence of a relatively small sized aortic root. A septal myomectomy was performed as well. The patient's early postoperative recovery was  notable for the development of postoperative atrial fibrillation and a left pleural effusion requiring thoracentesis. She otherwise recovered uneventfully and her symptoms of pain in her shoulder and neck resolved. The patient states that she did well until approximately 2 years ago when she began to experience a recurrence of similar symptoms of pain in her left shoulder and arm that are brought on with physical exertion and relieved by rest. Symptoms are described as a dull pain that oftentimes radiates up her neck to the left side of her jaw. Symptoms are at times associated with exertional shortness of breath. Over the past 2 years, symptoms have progressed, particularly over the last few months. This has dramatically limited her physical activity. The patient underwent  uncomplicated left sided C4-5 laminotomy and foraminotomy for decompression of cervical spondylosis with radiculitis by Dr. Dutch Quint in May 2015. The patient recovered from this surgery uneventfully, but her symptoms of left shoulder and arm pain have persisted. Given the nature of the symptoms and the lack of corresponding radiographic evidence to suggest the possibility that her symptoms are related to her degenerative disease of the cervical spine, the patient was referred back to Dr. Antoine Poche to reconsider possible cardiac etiology. Follow-up transthoracic echocardiogram performed 12/06/2013 revealed severe prosthetic valve dysfunction involving the patient's aortic valve with severe recurrent aortic stenosis and mild aortic insufficiency. Peak velocity across the aortic valve measured in excess of 4 m/s corresponding to peak and mean transvalvular gradients estimated 64 and 40 mmHg, respectively. Left ventricular systolic function was preserved with ejection fraction estimated 55-60%. Diagnostic cardiac catheterization was performed by Dr. Excell Seltzer 07/03/2014. Catheterization confirmed the presence of severe prosthetic valve aortic stenosis with mean transvalvular gradient 39 mmHg and at least mild aortic insufficiency. The patient had severe native three-vessel coronary artery disease, but all of her previously placed bypass grafts remained widely patent and free of any significant disease. The patient was referred for surgical consultation by Dr. Dorris Fetch. The patient was felt to be high risk for conventional surgical redo aortic valve replacement, and she was referred to Dr. Cornelius Moras for a second surgical opinion.It was felt that she was a reasonable candidate for valve-in-valve transcatheter aortic valve replacement as an alternative to redo open repair. All risks, benefits and alternatives of surgery were explained in detail, and the patient agreed to proceed.      Hospital Course:  The patient was admitted  to [facility] on 09/05/2014. The patient was taken to the operating room and underwent the above procedure.    The postoperative course was initially notable for hypertension, requiring high doses of IV Nitroglycerin.  Drips were eventually weaned and she was restarted on her home medications with good control.  She also had an acute blood loss anemia, but did not require transfusion. She remained stable and was transferred to the stepdown unit on postop day 2.   She overall has progressed well.  She is ambulating in the halls with cardiac rehab and is doing well with mobility. She is tolerating a regular diet.  Incisions are healing well.  A postoperative echocardiogram was performed on 09/07/2104, the results of which are pending at this time. Blood pressures are well controlled and she has been started on aspirin and Plavix. The patient is medically stable on today's date for discharge home.     Recent vital signs:  Filed Vitals:   09/08/14 0619  BP: 133/50  Pulse: 70  Temp: 97.7 F (36.5 C)  Resp: 18    Recent  laboratory studies:  CBC: Recent Labs  09/06/14 0415 09/07/14 0249  WBC 7.0 7.4  HGB 7.8* 8.8*  HCT 23.5* 27.2*  PLT 103* 108*   BMET:  Recent Labs  09/06/14 0415 09/07/14 0249  NA 135 136  K 3.5 3.5  CL 101 101  CO2 26 26  GLUCOSE 123* 116*  BUN 14 16  CREATININE 1.23* 1.46*  CALCIUM 8.6 9.1    PT/INR:  Recent Labs  09/05/14 1725  LABPROT 15.6*  INR 1.23     Discharge Medications:     Medication List    TAKE these medications        aspirin 81 MG tablet  Take 81 mg by mouth daily.     atorvastatin 80 MG tablet  Commonly known as:  LIPITOR  Take 80 mg by mouth daily.     benazepril 20 MG tablet  Commonly known as:  LOTENSIN  Take 20 mg by mouth daily.     CALTRATE 600 PO  Take 1 tablet by mouth daily.     clopidogrel 75 MG tablet  Commonly known as:  PLAVIX  Take 1 tablet (75 mg total) by mouth daily with breakfast.     fexofenadine  180 MG tablet  Commonly known as:  ALLEGRA  Take 180 mg by mouth daily.     hydrochlorothiazide 25 MG tablet  Commonly known as:  HYDRODIURIL  TAKE 1 TABLET BY MOUTH EVERY DAY     ibuprofen 200 MG tablet  Commonly known as:  ADVIL,MOTRIN  Take 200 mg by mouth every 6 (six) hours as needed for pain or headache (headache).     metoprolol tartrate 25 MG tablet  Commonly known as:  LOPRESSOR  Take 0.5 tablets (12.5 mg total) by mouth 2 (two) times daily.     NITROSTAT 0.3 MG SL tablet  Generic drug:  nitroGLYCERIN  Place 0.3 mg under the tongue every 5 (five) minutes as needed for chest pain.     timolol 0.5 % ophthalmic solution  Commonly known as:  TIMOPTIC  Place 1 drop into both eyes 2 (two) times daily.     traMADol 50 MG tablet  Commonly known as:  ULTRAM  Take 1-2 tablets (50-100 mg total) by mouth every 4 (four) hours as needed for moderate pain.     Vitamin D (Ergocalciferol) 50000 UNITS Caps capsule  Commonly known as:  DRISDOL  Take 50,000 Units by mouth every 7 (seven) days. On Tuesday     zolpidem 5 MG tablet  Commonly known as:  AMBIEN  Take 5 mg by mouth at bedtime as needed for sleep (sleep).         Discharge Instructions:  The patient is to refrain from driving, heavy lifting or strenuous activity.  May shower daily and clean incisions with soap and water.  May resume regular diet.   Follow Up:       Follow-up Information    Follow up with Purcell Nails, MD On 10/09/2014.   Specialty:  Cardiothoracic Surgery   Why:  Appointment is at 10:00   Contact information:   [address] [employer] [address] [place] [address] [phone]       Follow up with [facility] On 10/06/2014.   Specialty:  Cardiology   Why:  Please have an echocardiogram done at 1:00   Contact information:   [address] [id number] mc [place] [address] [phone]     The patient has been  discharged on:  1.Beta Blocker:  Yes [ x ]  No   If No, reason:    2.Ace Inhibitor/ARB: Yes [ x ]  No [  ]  If No, reason:    3.Statin: Yes [ x ]  No   If No, reason:    4.Ecasa: Yes [ x ]  No   If No, reason:   Haneen Bernales H 09/08/2014, 12:23 PM

## 2014-09-08 NOTE — Progress Notes (Signed)
Patient disappointed she was not discharged home this afternoon.  Patient cooperative and agreeable with staff, but has voiced disappointment about not going home.

## 2014-09-08 NOTE — Progress Notes (Signed)
Medicare Important Message given? YES (If response is "NO", the following Medicare IM given date fields will be blank) Date Medicare IM given:09/08/2014 Medicare IM given by: Tamanna Whitson 

## 2014-09-11 LAB — TYPE AND SCREEN
ABO/RH(D): B POS
ANTIBODY SCREEN: NEGATIVE
Unit division: 0
Unit division: 0

## 2014-10-06 ENCOUNTER — Ambulatory Visit (HOSPITAL_COMMUNITY)
Admission: RE | Admit: 2014-10-06 | Discharge: 2014-10-06 | Disposition: A | Discharge status: Home / Self Care | Payer: Medicare HMO | Source: Ambulatory Visit | Attending: Cardiovascular Disease | Admitting: Cardiovascular Disease

## 2014-10-06 DIAGNOSIS — I350 Nonrheumatic aortic (valve) stenosis: Secondary | ICD-10-CM | POA: Insufficient documentation

## 2014-10-06 NOTE — Progress Notes (Signed)
  Echocardiogram 2D Echocardiogram has been performed.  [name], [name] 10/06/2014, 1:45 PM

## 2014-10-09 ENCOUNTER — Ambulatory Visit (INDEPENDENT_AMBULATORY_CARE_PROVIDER_SITE_OTHER): Payer: Medicare HMO | Admitting: Thoracic Surgery (Cardiothoracic Vascular Surgery)

## 2014-10-09 ENCOUNTER — Encounter: Payer: Self-pay | Admitting: Thoracic Surgery (Cardiothoracic Vascular Surgery)

## 2014-10-09 VITALS — BP 145/77 | HR 60 | Resp 20 | Ht 61.0 in | Wt 150.0 lb

## 2014-10-09 DIAGNOSIS — Z954 Presence of other heart-valve replacement: Secondary | ICD-10-CM | POA: Diagnosis not present

## 2014-10-09 DIAGNOSIS — I350 Nonrheumatic aortic (valve) stenosis: Secondary | ICD-10-CM | POA: Diagnosis not present

## 2014-10-09 DIAGNOSIS — Z952 Presence of prosthetic heart valve: Secondary | ICD-10-CM

## 2014-10-09 MED ORDER — CLOPIDOGREL BISULFATE 75 MG PO TABS: 75.0000 mg | ORAL_TABLET | Freq: Every day | ORAL | Status: DC

## 2014-10-09 NOTE — Progress Notes (Signed)
[facility]   MULTIDISCIPLINARY HEART VALVE CLINIC       CARDIOTHORACIC SURGERY NOTE  Referring Provider is [name], MD PCP is Tarri Fuller, MD   HPI:  Patient is a 81 year old female who returns for routine follow-up approximately one month status post valve-in-valve transcatheter aortic valve replacement using a 20 mm Edwards Sapien S3 transcatheter heart valve via open right transfemoral approach on 09/05/2014 for severe prosthetic valve disease, originally s/p aortic valve replacement using a 21 mm Sorin Mitroflow bioprosthetic tissue valve in 2009.  The patient's postoperative recovery was uncomplicated and she was discharged home on the third postoperative day. Since hospital discharge she has done remarkably well. She returns to the office for routine follow-up today. She states that she is back to her normal physical activity. She has been walking every day and back doing fairly strenuous yard work. She reports no physical limitations whatsoever. She states that she only gets short of breath if she really pushes herself physically. She no longer gets the pain in her chest and left shoulder with exertion that she had been experiencing prior to surgery. She feels quite well and is very pleased with her outcome.   Current Outpatient Prescriptions  Medication Sig Dispense Refill  . aspirin 81 MG tablet Take 81 mg by mouth daily.      Marland Kitchen atorvastatin (LIPITOR) 80 MG tablet Take 80 mg by mouth daily.    . benazepril (LOTENSIN) 20 MG tablet Take 20 mg by mouth daily.    . Calcium Carbonate (CALTRATE 600 PO) Take 1 tablet by mouth daily.     . clopidogrel (PLAVIX) 75 MG tablet Take 1 tablet (75 mg total) by mouth daily with breakfast. 30 tablet 1  . fexofenadine (ALLEGRA) 180 MG tablet Take 180 mg by mouth daily.    . hydrochlorothiazide (HYDRODIURIL) 25 MG tablet TAKE 1 TABLET BY MOUTH EVERY DAY 90 tablet 3  . ibuprofen (ADVIL,MOTRIN) 200 MG tablet Take 200 mg by  mouth every 6 (six) hours as needed for pain or headache (headache).    . metoprolol tartrate (LOPRESSOR) 25 MG tablet Take 0.5 tablets (12.5 mg total) by mouth 2 (two) times daily. 60 tablet 1  . NITROSTAT 0.3 MG SL tablet Place 0.3 mg under the tongue every 5 (five) minutes as needed for chest pain.     Marland Kitchen timolol (TIMOPTIC) 0.5 % ophthalmic solution Place 1 drop into both eyes 2 (two) times daily.    . Vitamin D, Ergocalciferol, (DRISDOL) 50000 UNITS CAPS Take 50,000 Units by mouth every 7 (seven) days. On Tuesday    . zolpidem (AMBIEN) 5 MG tablet Take 5 mg by mouth at bedtime as needed for sleep (sleep).    . traMADol (ULTRAM) 50 MG tablet Take 1-2 tablets (50-100 mg total) by mouth every 4 (four) hours as needed for moderate pain. (Patient not taking: Reported on 10/09/2014) 30 tablet 0   No current facility-administered medications for this visit.      Physical Exam:   BP 145/77 mmHg  Pulse 60  Resp 20  Ht  (1.549 m)  Wt 150 lb (68.04 kg)  BMI 28.36 kg/m2  SpO2 97%  General:  Well-appearing  Chest:   Clear  CV:   Regular rate and rhythm with grade 3/6 harsh systolic murmur heard best along the right sternal border. No diastolic murmur is appreciated.  Incisions:  Well-healed  Abdomen:  Soft and nontender  Extremities:  Warm and well-perfused  Diagnostic Tests:  Transthoracic Echocardiography  Patient:  [name], Asti E MR #:    [id number] Study Date: 10/06/2014 Gender:   F Age:    [age] Height:   154.9 cm Weight:   67.6 kg BSA:    1.73 m^2 Pt. Status: Room:  ATTENDING  Tonny BollmanMichael Cooper ORDERING   Tonny BollmanMichael Cooper REFERRING  Tonny BollmanMichael Cooper SONOGRAPHER Arvil Chacoachel Foster PERFORMING  [facility], Inpatient  cc:  ------------------------------------------------------------------- LV EF: 60% -  65%  ------------------------------------------------------------------- Indications:   V43.3 S/P Heart valve  replacement.  ------------------------------------------------------------------- History:  PMH: 30 days post TAVR with bioprosthetic aortic valve. Carotid stenosis. Dyspnea. Risk factors: Hypertension. Dyslipidemia.  ------------------------------------------------------------------- Study Conclusions  - Left ventricle: The cavity size was normal. Wall thickness was increased in a pattern of mild LVH. Systolic function was normal. The estimated ejection fraction was in the range of 60% to 65%. Wall motion was normal; there were no regional wall motion abnormalities. Doppler parameters are consistent with abnormal left ventricular relaxation (grade 1 diastolic dysfunction). - Aortic valve: Moderately calcified annulus. There was moderate stenosis. There was trivial regurgitation. - Mitral valve: There was mild regurgitation. - Left atrium: The atrium was mildly dilated.  ------------------------------------------------------------------- Labs, prior tests, procedures, and surgery: Coronary artery bypass grafting.  Transthoracic echocardiography. M-mode, complete 2D, spectral Doppler, and color Doppler. Birthdate: Patient birthdate: 18-Jul-1932. Age: Patient is 81 yr old. Sex: Gender: female. BMI: 28.2 kg/m^2. Blood pressure:   120/78 Patient status: Inpatient. Study date: Study date: 10/06/2014. Study time: 01:06 PM. Location: Bedside.  -------------------------------------------------------------------  ------------------------------------------------------------------- Left ventricle: The cavity size was normal. Wall thickness was increased in a pattern of mild LVH. Systolic function was normal. The estimated ejection fraction was in the range of 60% to 65%. Wall motion was normal; there were no regional wall motion abnormalities. Doppler parameters are consistent with abnormal left ventricular relaxation (grade 1 diastolic  dysfunction).  ------------------------------------------------------------------- Aortic valve:  Moderately calcified annulus. Doppler:  There was moderate stenosis.  There was trivial regurgitation.  Mean gradient (S): 21 mm Hg. Peak gradient (S): 42 mm Hg.  ------------------------------------------------------------------- Aorta: Aortic root: The aortic root was normal in size. Ascending aorta: The ascending aorta was normal in size.  ------------------------------------------------------------------- Mitral valve:  Mildly thickened leaflets . Doppler: There was mild regurgitation.  Peak gradient (D): 5 mm Hg.  ------------------------------------------------------------------- Left atrium: The atrium was mildly dilated.  ------------------------------------------------------------------- Right ventricle: The cavity size was normal. Systolic function was normal.  ------------------------------------------------------------------- Pulmonic valve:  The valve appears to be grossly normal. Doppler: There was no significant regurgitation.  ------------------------------------------------------------------- Tricuspid valve:  Structurally normal valve.  Leaflet separation was normal. Doppler: Transvalvular velocity was within the normal range. There was trivial regurgitation.  ------------------------------------------------------------------- Pulmonary artery:  Systolic pressure was at the upper limits of normal.  ------------------------------------------------------------------- Right atrium: The atrium was normal in size.  ------------------------------------------------------------------- Pericardium: There was no pericardial effusion.  ------------------------------------------------------------------- Systemic veins: Inferior vena cava: The vessel was normal in size. The respirophasic diameter changes were in the normal range (>=  50%), consistent with normal central venous pressure.  ------------------------------------------------------------------- Measurements  Left ventricle             Value    Reference LV ID, ED, PLAX chordal    (L)   40  mm   43 - 52 LV ID, ES, PLAX chordal        28  mm   23 - 38 LV fx shortening, PLAX chordal     30  %   >=29 LV PW  thickness, ED          12  mm   --------- IVS/LV PW ratio, ED          1.08     <=1.3 LV e&', lateral             5.77 cm/s  --------- LV E/e&', lateral            19.93    --------- LV e&', medial             3.26 cm/s  --------- LV E/e&', medial            35.28    --------- LV e&', average             4.52 cm/s  --------- LV E/e&', average            25.47    ---------  Ventricular septum           Value    Reference IVS thickness, ED           13  mm   ---------  LVOT                  Value    Reference LVOT ID, S               19  mm   --------- LVOT area               2.84 cm^2  ---------  Aortic valve              Value    Reference Aortic valve mean velocity, S     208  cm/s  --------- Aortic valve VTI, S          75.8 cm   --------- Aortic mean gradient, S        21  mm Hg --------- Aortic peak gradient, S        42  mm Hg ---------  Aorta                 Value    Reference Aortic root ID, ED           31  mm   ---------  Left atrium              Value    Reference LA ID, A-P, ES             44  mm   --------- LA ID/bsa, A-P         (H)   2.55 cm/m^2 <=2.2 LA volume, S              45.3 ml   --------- LA  volume/bsa, S            26.2 ml/m^2 --------- LA volume, ES, 1-p A4C         39.7 ml   --------- LA volume/bsa, ES, 1-p A4C       23  ml/m^2 --------- LA volume, ES, 1-p A2C         45.8 ml   --------- LA volume/bsa, ES, 1-p A2C       26.5 ml/m^2 ---------  Mitral valve              Value    Reference Mitral E-wave peak velocity      115  cm/s  --------- Mitral A-wave peak velocity      140  cm/s  --------- Mitral deceleration time    (  H)   401  ms   150 - 230 Mitral peak gradient, D        5   mm Hg --------- Mitral E/A ratio, peak         0.8     ---------  Pulmonary arteries           Value    Reference PA pressure, S, DP           30  mm Hg <=30  Tricuspid valve            Value    Reference Tricuspid regurg peak velocity     258  cm/s  --------- Tricuspid peak RV-RA gradient     27  mm Hg ---------  Systemic veins             Value    Reference Estimated CVP             3   mm Hg ---------  Right ventricle            Value    Reference RV pressure, S, DP           30  mm Hg <=30 RV s&', lateral, S           7.37 cm/s  ---------  Legend: (L) and (H) mark values outside specified reference range.  ------------------------------------------------------------------- Prepared and Electronically Authenticated by  Kristeen Miss, M.D. 2016-04-01T17:16:28   Impression:  Patient is doing remarkably well approximately one month status post valve-in-valve transcatheter aortic valve replacement using a 20 mm Edwards Sapien S3 transcatheter heart valve for severe prosthetic valve dysfunction. The patient reports complete return to normal physical activity without limitations. She states that she only gets short of  breath with relatively strenuous exertion, consistent with chronic diastolic congestive heart failure, New York Heart Association functional class I. She no longer has any symptoms of exertional chest pain or shoulder pain consistent with angina pectoris, as she had been experiencing prior to surgery.  I have personally reviewed the patient's recent follow-up transthoracic echocardiogram. The transcatheter heart valve is functioning normally.  Peak velocity across aortic valve measured approximately 3.2 m/sec, corresponding to a mean transvalvular gradient of 21 mmHg. There may be mild paravalvular leak although in most views no aortic insufficiency is identified. Left ventricular systolic function is normal with ejection fraction reported 60-65%.   Plan:  I have reminded the patient that in the absence of complications we recommend continued dual antiplatelet therapy using aspirin and Plavix for a total of 6 months from the time of her surgery. I have not recommended any other changes to the patient's current medications at this time. She may resume unrestricted physical activity. All of her questions have been addressed. The patient will schedule routine follow-up appointment with Dr. Antoine Poche at some point within the next 6 months. She will be seen for routine follow-up in the multidisciplinary heart valve clinic in 1 year with routine follow-up echocardiogram.   Salvatore Decent. Cornelius Moras, MD 10/09/2014 10:41 AM

## 2014-10-09 NOTE — Patient Instructions (Signed)
Patient may resume unrestricted physical activity without any particular limitations at this time.  Continue taking Plavix for a total of 6 months after your recent surgery  Schedule an appointment with Dr Antoine PocheHochrein for follow up.  Discuss with him whether or not to remain on metoprolol (Lopressor)   Endocarditis is a potentially serious infection of heart valves or inside lining of the heart.  It occurs more commonly in patients with diseased heart valves (such as patient's with aortic or mitral valve disease) and in patients who have undergone heart valve repair or replacement.  Certain surgical and dental procedures may put you at risk, such as dental cleaning, other dental procedures, or any surgery involving the respiratory, urinary, gastrointestinal tract, gallbladder or prostate gland.   To minimize your chances for develooping endocarditis, maintain good oral health and seek prompt medical attention for any infections involving the mouth, teeth, gums, skin or urinary tract.  Always notify your doctor or dentist about your underlying heart valve condition before having any invasive procedures. You will need to take antibiotics before certain procedures.

## 2014-11-29 ENCOUNTER — Other Ambulatory Visit: Payer: Self-pay | Admitting: Thoracic Surgery (Cardiothoracic Vascular Surgery)

## 2014-12-12 ENCOUNTER — Other Ambulatory Visit (HOSPITAL_COMMUNITY): Payer: Medicare HMO

## 2014-12-12 ENCOUNTER — Ambulatory Visit: Payer: Medicare HMO | Admitting: Family

## 2014-12-15 ENCOUNTER — Ambulatory Visit (INDEPENDENT_AMBULATORY_CARE_PROVIDER_SITE_OTHER): Payer: Medicare HMO | Admitting: Cardiology

## 2014-12-15 ENCOUNTER — Encounter: Payer: Self-pay | Admitting: Cardiology

## 2014-12-15 VITALS — BP 108/70 | HR 64 | Ht 61.0 in | Wt 147.0 lb

## 2014-12-15 DIAGNOSIS — Z954 Presence of other heart-valve replacement: Secondary | ICD-10-CM | POA: Diagnosis not present

## 2014-12-15 DIAGNOSIS — Z952 Presence of prosthetic heart valve: Secondary | ICD-10-CM

## 2014-12-15 NOTE — Progress Notes (Signed)
HPI    The patient presents for follow-up after Tran's catheter aortic valve replacement using a 20 mm Edwards Sapien valve via transfemoral. She had severe prosthetic valve stenosis having had a previous tissue valve in 2009. She did very well with this procedure. She had follow-up 1 month echo which demonstrated a mild gradient and questionable mild perivalvular leak. However, her symptoms of neck discomfort that was obviously an anginal equivalent went completely away. She recently got back from a cruise where she did quite a bit of walking. The patient denies any new symptoms such as chest discomfort, neck or arm discomfort. There has been no new shortness of breath, PND or orthopnea. There have been no reported palpitations, presyncope or syncope.  Allergies  Allergen Reactions  . Zetia [Ezetimibe] Other (See Comments)    DIZZINESS    Current Outpatient Prescriptions  Medication Sig Dispense Refill  . aspirin 81 MG tablet Take 81 mg by mouth daily.      Marland Kitchen atorvastatin (LIPITOR) 80 MG tablet Take 80 mg by mouth daily.    . benazepril (LOTENSIN) 20 MG tablet Take 20 mg by mouth daily.    . Calcium Carbonate (CALTRATE 600 PO) Take 1 tablet by mouth daily.     . clopidogrel (PLAVIX) 75 MG tablet Take 1 tablet (75 mg total) by mouth daily with breakfast. 30 tablet 1  . hydrochlorothiazide (HYDRODIURIL) 25 MG tablet TAKE 1 TABLET BY MOUTH EVERY DAY 90 tablet 3  . metoprolol tartrate (LOPRESSOR) 25 MG tablet Take 0.5 tablets (12.5 mg total) by mouth 2 (two) times daily. 60 tablet 1  . timolol (TIMOPTIC) 0.5 % ophthalmic solution Place 1 drop into both eyes 2 (two) times daily.    . Vitamin D, Ergocalciferol, (DRISDOL) 50000 UNITS CAPS Take 50,000 Units by mouth every 7 (seven) days. On Tuesday    . zolpidem (AMBIEN) 5 MG tablet Take 5 mg by mouth at bedtime as needed for sleep (sleep).     No current facility-administered medications for this visit.    Past Medical History  Diagnosis  Date  . Aortic stenosis     a.  s/p tissue AVR at time of CABG in 2009;  b. Echo 04/2012: EF 55-60%, moderate AS (mean 34);  c. Echo 6/14: Mild LVH, mild focal basal septal hypertrophy, EF 55-60%, normal wall motion, grade 2 diastolic dysfunction, AVR with moderate aortic stenosis (mean 36) - consider TEE if clinically indicated, mild AI, mild MR, PASP 44, ascending aorta mild to moderately dilated (consider CTA or MRA)  . Dyslipidemia   . Hypertension   . Spinal arthritis   . CAD (coronary artery disease)     a. s/p CABG; b. Myoview 06/2011: No ischemia, EF 67%;  c. 01/2013 Cath: LM min irregs, LAD small, LCX 124m OMs ok, RCA known 100, VG->RCA ok, VG->OM1->2 ok, LIMA->LAD ok.  Marland Kitchen Hx of CABG     LIMA-LAD, SVG-RCA, SVG-OM1/OM2 in 2009  . Blindness of right eye     due to retinal bleed  . Glaucoma   . Left bundle branch block   . Heart murmur   . Chronic kidney disease     renal insufficiency  . GERD (gastroesophageal reflux disease)   . Arthritis   . Blind right eye   . HOH (hard of hearing)   . S/P aortic valve replacement with bioprosthetic valve 07/28/2007    #19mm Sorin Mitroflow pericardial tissue valve    . S/P CABG x 4 07/28/2007  LIMA to LAD, SVG to RCA, sequential SVG to OM1-OM2  . Unstable angina 01/14/2013  . Aortic stenosis, severe 07/03/2014    Recurrent, due to prosthetic valve stenosis   . Prosthetic valve dysfunction   . Renal insufficiency   . PONV (postoperative nausea and vomiting)     nausea  yrs ago  . Carotid stenosis     Carotid U/S 5/13:  bilat 40-59%  . Occlusion and stenosis of carotid artery without mention of cerebral infarction 11/28/2011  . Shortness of breath     occ  . History of hiatal hernia   . Cancer     ovarian?  . S/P TAVR (transcatheter aortic valve replacement) 09/05/2014    20 mm Edwards Sapien 3 transcatheter heart valve placed via open right transfemoral approach for valve-in-valve replacement for prosthetic valve dysfunction    Past  Surgical History  Procedure Laterality Date  . Coronary artery bypass graft  Jan 2009    LIMA to LAD, SVG to RCA, SVG to OM 1 & 2  . Aortic valve replacement  Jan 2009    #21 mm pericardial prosthesis  . Back surgery    . Neck surgery      cervical  . Abdominal hysterectomy    . Cholecystectomy    . Cardiac catheterization  2014  . Appendectomy    . Posterior cervical laminectomy Left 11/04/2013    Procedure: Left Cervical Four-Five Foraminotomy ;  Surgeon: Temple Pacini, MD;  Location: [facility];  Service: Neurosurgery;  Laterality: Left;  Left Cervical Four-Five Foraminotomy   . Left and right heart catheterization with coronary/graft angiogram N/A 01/13/2013    Procedure: LEFT AND RIGHT HEART CATHETERIZATION WITH Isabel Caprice;  Surgeon: [name], MD;  Location: [facility] CATH LAB;  Service: Cardiovascular;  Laterality: N/A;  . Left heart catheterization with coronary/graft angiogram N/A 07/03/2014    Procedure: LEFT HEART CATHETERIZATION WITH Isabel Caprice;  Surgeon: Micheline Chapman, MD;  Location: [facility] CATH LAB;  Service: Cardiovascular;  Laterality: N/A;  . Tee without cardioversion N/A 08/10/2014    Procedure: TRANSESOPHAGEAL ECHOCARDIOGRAM (TEE);  Surgeon: Lars Masson, MD;  Location: [facility] ENDOSCOPY;  Service: Cardiovascular;  Laterality: N/A;  . Eye surgery Right     retinal detachment blind  . Transcatheter aortic valve replacement, transfemoral N/A 09/05/2014    Procedure: TRANSCATHETER AORTIC VALVE REPLACEMENT, TRANSFEMORAL;  Surgeon: Micheline Chapman, MD;  Location: [facility] OR;  Service: Open Heart Surgery;  Laterality: N/A;  . Tee without cardioversion N/A 09/05/2014    Procedure: TRANSESOPHAGEAL ECHOCARDIOGRAM (TEE);  Surgeon: Micheline Chapman, MD;  Location: [facility] OR;  Service: Open Heart Surgery;  Laterality: N/A;    ROS: As stated in the HPI and negative for all other systems.  PHYSICAL EXAM BP 108/70 mmHg  Pulse 64  Ht 5\' 1"  (1.549 m)  Wt 147 lb (66.679  kg)  BMI 27.79 kg/m2 GENERAL:  Well appearing HEENT: Right pupil fixed and lens opacified. Dentures.  NECK:  No jugular venous distention, waveform within normal limits, carotid upstroke brisk and symmetric, no bruits, no thyromegaly LYMPHATICS:  No cervical, inguinal adenopathy LUNGS:  Clear to auscultation bilaterally CHEST:  Well healed sternotomy scar. HEART:  PMI not displaced or sustained,S1 and S2 within normal limits, no S3, no S4, no clicks, no rubs, early peaking systolic murmur radiating out the outflow tract, nodiastolic murmur, late peaking and radiating heart at the left 4th intercostal space.  ABD:  Flat, positive bowel sounds normal in frequency  in pitch, no bruits, no rebound, no guarding, no midline pulsatile mass, no hepatomegaly, no splenomegaly EXT:  2 plus pulses throughout, no edema, no cyanosis no clubbing   ASSESSMENT AND PLAN   CAD (coronary artery disease) -  The patient has no new sypmtoms.  No further cardiovascular testing is indicated.  We will continue with aggressive risk reduction and meds as listed.  She had patent grafts at the last catheterization. I reviewed these records as well as all hospital records.  She can stop her beta blocker.   Hypertension -  The blood pressure is at target. No change in medications is indicated. We will continue with therapeutic lifestyle changes (TLC).  S/P aortic valve replacement with bioprosthetic valve -  She is s/p TAVR.  She is doing well no change in therapy is indicated.    Dyslipidemia -  This is managed per Tarri Fuller, MD  Carotid stenosis - She has nonobstructive carotid stenosis followed by  [facility]. They are repeating this next week.

## 2014-12-15 NOTE — Patient Instructions (Signed)
Your physician wants you to follow-up in: 6 Months You will receive a reminder letter in the mail two months in advance. If you don't receive a letter, please call our office to schedule the follow-up appointment.  Your physician has recommended you make the following change in your medication: STOP Metoprolol

## 2014-12-18 ENCOUNTER — Encounter: Payer: Self-pay | Admitting: Family

## 2014-12-20 ENCOUNTER — Ambulatory Visit (INDEPENDENT_AMBULATORY_CARE_PROVIDER_SITE_OTHER): Payer: Medicare HMO | Admitting: Family

## 2014-12-20 ENCOUNTER — Encounter: Payer: Self-pay | Admitting: Family

## 2014-12-20 ENCOUNTER — Ambulatory Visit (HOSPITAL_COMMUNITY)
Admission: RE | Admit: 2014-12-20 | Discharge: 2014-12-20 | Disposition: A | Discharge status: Home / Self Care | Payer: Medicare HMO | Source: Ambulatory Visit | Attending: Vascular Surgery | Admitting: Vascular Surgery

## 2014-12-20 VITALS — BP 150/78 | HR 66 | Resp 14 | Ht 61.0 in | Wt 151.0 lb

## 2014-12-20 DIAGNOSIS — I6523 Occlusion and stenosis of bilateral carotid arteries: Secondary | ICD-10-CM | POA: Diagnosis not present

## 2014-12-20 NOTE — Addendum Note (Signed)
Addended by: Adria Dill L on: 12/20/2014 03:22 PM   Modules accepted: Orders

## 2014-12-20 NOTE — Patient Instructions (Signed)
Stroke Prevention Some medical conditions and behaviors are associated with an increased chance of having a stroke. You may prevent a stroke by making healthy choices and managing medical conditions. HOW CAN I REDUCE MY RISK OF HAVING A STROKE?   Stay physically active. Get at least 30 minutes of activity on most or all days.  Do not smoke. It may also be helpful to avoid exposure to secondhand smoke.  Limit alcohol use. Moderate alcohol use is considered to be:  No more than 2 drinks per day for men.  No more than 1 drink per day for nonpregnant women.  Eat healthy foods. This involves:  Eating 5 or more servings of fruits and vegetables a day.  Making dietary changes that address high blood pressure (hypertension), high cholesterol, diabetes, or obesity.  Manage your cholesterol levels.  Making food choices that are high in fiber and low in saturated fat, trans fat, and cholesterol may control cholesterol levels.  Take any prescribed medicines to control cholesterol as directed by your health care provider.  Manage your diabetes.  Controlling your carbohydrate and sugar intake is recommended to manage diabetes.  Take any prescribed medicines to control diabetes as directed by your health care provider.  Control your hypertension.  Making food choices that are low in salt (sodium), saturated fat, trans fat, and cholesterol is recommended to manage hypertension.  Take any prescribed medicines to control hypertension as directed by your health care provider.  Maintain a healthy weight.  Reducing calorie intake and making food choices that are low in sodium, saturated fat, trans fat, and cholesterol are recommended to manage weight.  Stop drug abuse.  Avoid taking birth control pills.  Talk to your health care provider about the risks of taking birth control pills if you are over 35 years old, smoke, get migraines, or have ever had a blood clot.  Get evaluated for sleep  disorders (sleep apnea).  Talk to your health care provider about getting a sleep evaluation if you snore a lot or have excessive sleepiness.  Take medicines only as directed by your health care provider.  For some people, aspirin or blood thinners (anticoagulants) are helpful in reducing the risk of forming abnormal blood clots that can lead to stroke. If you have the irregular heart rhythm of atrial fibrillation, you should be on a blood thinner unless there is a good reason you cannot take them.  Understand all your medicine instructions.  Make sure that other conditions (such as anemia or atherosclerosis) are addressed. SEEK IMMEDIATE MEDICAL CARE IF:   You have sudden weakness or numbness of the face, arm, or leg, especially on one side of the body.  Your face or eyelid droops to one side.  You have sudden confusion.  You have trouble speaking (aphasia) or understanding.  You have sudden trouble seeing in one or both eyes.  You have sudden trouble walking.  You have dizziness.  You have a loss of balance or coordination.  You have a sudden, severe headache with no known cause.  You have new chest pain or an irregular heartbeat. Any of these symptoms may represent a serious problem that is an emergency. Do not wait to see if the symptoms will go away. Get medical help at once. Call your local emergency services (911 in [place] Do not drive yourself to the hospital. Document Released: 07/31/2004 Document Revised: 11/07/2013 Document Reviewed: 12/24/2012 Exit[employer] Patient Information 2015 [employer]. This information is not intended to replace advice given   to you by your health care provider. Make sure you discuss any questions you have with your health care provider.  

## 2014-12-20 NOTE — Progress Notes (Signed)
Established Carotid Patient   History of Present Illness  [name] is a 81 y.o. female patient of Dr. Darrick Penna followed for carotid artery stenosis with serial duplex. The patient denies any signs or symptoms of CVA, TIA, amaurosis fugax or any neural deficit.  Patient is legally blind in her right eye due to glaucoma. Pt states her sister had a stroke with severe neurological deficits.  Patient has not had previous carotid artery intervention. Pt denies any claudication symptoms in legs with walking.  Pt reports New Medical or Surgical History: September 05, 2014 aortic valve replacement, possibly bovine per husband, minimally invasive approach  C-spine surgery May, 2015. She does not drink ETOH. Had a CABG in 2009, also had aortic valve replaced. She is now able to walk more since her heart valve was replaced but has not been since she is out of the habit; advised to gradually resume walking to about 30 minutes daily.  Pt Diabetic: No Pt smoker: non-smoker  Pt meds include: Statin : Yes ASA: Yes Other anticoagulants/antiplatelets: Plavix for 6 months post aortic valve replacement    Past Medical History  Diagnosis Date  . Aortic stenosis     a.  s/p tissue AVR at time of CABG in 2009;  b. Echo 04/2012: EF 55-60%, moderate AS (mean 34);  c. Echo 6/14: Mild LVH, mild focal basal septal hypertrophy, EF 55-60%, normal wall motion, grade 2 diastolic dysfunction, AVR with moderate aortic stenosis (mean 36) - consider TEE if clinically indicated, mild AI, mild MR, PASP 44, ascending aorta mild to moderately dilated (consider CTA or MRA)  . Dyslipidemia   . Hypertension   . Spinal arthritis   . CAD (coronary artery disease)     a. s/p CABG; b. Myoview 06/2011: No ischemia, EF 67%;  c. 01/2013 Cath: LM min irregs, LAD small, LCX 164m OMs ok, RCA known 100, VG->RCA ok, VG->OM1->2 ok, LIMA->LAD ok.  Marland Kitchen Hx of CABG     LIMA-LAD, SVG-RCA, SVG-OM1/OM2 in 2009  . Blindness of right eye    due to retinal bleed  . Glaucoma   . Left bundle branch block   . Heart murmur   . Chronic kidney disease     renal insufficiency  . GERD (gastroesophageal reflux disease)   . Arthritis   . Blind right eye   . HOH (hard of hearing)   . S/P aortic valve replacement with bioprosthetic valve 07/28/2007    #41mm Sorin Mitroflow pericardial tissue valve    . S/P CABG x 4 07/28/2007    LIMA to LAD, SVG to RCA, sequential SVG to OM1-OM2  . Unstable angina 01/14/2013  . Aortic stenosis, severe 07/03/2014    Recurrent, due to prosthetic valve stenosis   . Prosthetic valve dysfunction   . Renal insufficiency   . PONV (postoperative nausea and vomiting)     nausea  yrs ago  . Carotid stenosis     Carotid U/S 5/13:  bilat 40-59%  . Occlusion and stenosis of carotid artery without mention of cerebral infarction 11/28/2011  . Shortness of breath     occ  . History of hiatal hernia   . Cancer     ovarian?  . S/P TAVR (transcatheter aortic valve replacement) 09/05/2014    20 mm Edwards Sapien 3 transcatheter heart valve placed via open right transfemoral approach for valve-in-valve replacement for prosthetic valve dysfunction    Social History History  Substance Use Topics  . Smoking status: Never Smoker   .  Smokeless tobacco: Never Used  . Alcohol Use: No    Family History Family History  Problem Relation Age of Onset  . Cancer Mother   . Heart attack Father   . Heart attack Brother     Surgical History Past Surgical History  Procedure Laterality Date  . Coronary artery bypass graft  Jan 2009    LIMA to LAD, SVG to RCA, SVG to OM 1 & 2  . Aortic valve replacement  Jan 2009    #21 mm pericardial prosthesis  . Back surgery    . Neck surgery      cervical  . Abdominal hysterectomy    . Cholecystectomy    . Cardiac catheterization  2014  . Appendectomy    . Posterior cervical laminectomy Left 11/04/2013    Procedure: Left Cervical Four-Five Foraminotomy ;  Surgeon: Temple Pacini,  MD;  Location: [facility];  Service: Neurosurgery;  Laterality: Left;  Left Cervical Four-Five Foraminotomy   . Left and right heart catheterization with coronary/graft angiogram N/A 01/13/2013    Procedure: LEFT AND RIGHT HEART CATHETERIZATION WITH Isabel Caprice;  Surgeon: [name], MD;  Location: [facility] CATH LAB;  Service: Cardiovascular;  Laterality: N/A;  . Left heart catheterization with coronary/graft angiogram N/A 07/03/2014    Procedure: LEFT HEART CATHETERIZATION WITH Isabel Caprice;  Surgeon: Micheline Chapman, MD;  Location: [facility] CATH LAB;  Service: Cardiovascular;  Laterality: N/A;  . Tee without cardioversion N/A 08/10/2014    Procedure: TRANSESOPHAGEAL ECHOCARDIOGRAM (TEE);  Surgeon: Lars Masson, MD;  Location: [facility] ENDOSCOPY;  Service: Cardiovascular;  Laterality: N/A;  . Eye surgery Right     retinal detachment blind  . Transcatheter aortic valve replacement, transfemoral N/A 09/05/2014    Procedure: TRANSCATHETER AORTIC VALVE REPLACEMENT, TRANSFEMORAL;  Surgeon: Micheline Chapman, MD;  Location: [facility] OR;  Service: Open Heart Surgery;  Laterality: N/A;  . Tee without cardioversion N/A 09/05/2014    Procedure: TRANSESOPHAGEAL ECHOCARDIOGRAM (TEE);  Surgeon: Micheline Chapman, MD;  Location: [facility] OR;  Service: Open Heart Surgery;  Laterality: N/A;    Allergies  Allergen Reactions  . Zetia [Ezetimibe] Other (See Comments)    DIZZINESS    Current Outpatient Prescriptions  Medication Sig Dispense Refill  . aspirin 81 MG tablet Take 81 mg by mouth daily.      Marland Kitchen atorvastatin (LIPITOR) 80 MG tablet Take 80 mg by mouth daily.    . benazepril (LOTENSIN) 20 MG tablet Take 20 mg by mouth daily.    . Calcium Carbonate (CALTRATE 600 PO) Take 1 tablet by mouth daily.     . clopidogrel (PLAVIX) 75 MG tablet Take 1 tablet (75 mg total) by mouth daily with breakfast. 30 tablet 1  . hydrochlorothiazide (HYDRODIURIL) 25 MG tablet TAKE 1 TABLET BY MOUTH EVERY DAY 90 tablet 3  .  timolol (TIMOPTIC) 0.5 % ophthalmic solution Place 1 drop into both eyes 2 (two) times daily.    . Vitamin D, Ergocalciferol, (DRISDOL) 50000 UNITS CAPS Take 50,000 Units by mouth every 7 (seven) days. On Tuesday    . zolpidem (AMBIEN) 5 MG tablet Take 5 mg by mouth at bedtime as needed for sleep (sleep).     No current facility-administered medications for this visit.    Review of Systems : See HPI for pertinent positives and negatives.  Physical Examination Filed Vitals:   12/20/14 1318 12/20/14 1322  BP: 144/81 146/76  Pulse: 65 64  Resp: 14   Height:  (1.549 m)  Weight: 151 lb (68.493 kg)    Body mass index is 28.55 kg/(m^2).   General: WDWN female in NAD GAIT: normal Eyes: Right pupil is non reactive Pulmonary: Non-labored, CTAB, Negative Rales, Negative rhonchi, & Negative wheezing.  Cardiac: regular Rhythm with occasional premature beats, valve click ausculated, no detected murmur.  VASCULAR EXAM Carotid Bruits Left Right   Negative Negative    Radial pulses are 2+ palpable and equal.      LE Pulses LEFT RIGHT   POPLITEAL not palpable  not palpable   POSTERIOR TIBIAL 2+ palpable  1+ palpable    DORSALIS PEDIS  ANTERIOR TIBIAL 1+ palpable  1+ palpable     Gastrointestinal: soft, nontender, BS WNL, no r/g,no palpable masses.  Musculoskeletal: Negative muscle atrophy/wasting. M/S 5/5 throughout, Extremities without ischemic changes  Neurologic: A&O X 3; Appropriate Affect, Speech is normal CN 2-12 intact except right pupil is non reactive, Pain and light touch intact in extremities, Motor exam as listed above.          Non-Invasive Vascular Imaging CAROTID DUPLEX 12/20/2014   CEREBROVASCULAR DUPLEX EVALUATION    INDICATION: Carotid artery disease     PREVIOUS  INTERVENTION(S):     DUPLEX EXAM:     RIGHT  LEFT  Peak Systolic Velocities (cm/s) End Diastolic Velocities (cm/s) Plaque LOCATION Peak Systolic Velocities (cm/s) End Diastolic Velocities (cm/s) Plaque  69 21  CCA PROXIMAL 82 24   74 22  CCA MID 72 21 HT  97 31 HT CCA DISTAL 85 25 HT  114 9  ECA 145 12   80 23 HT ICA PROXIMAL 103 22 HT  153 50  ICA MID 156 50   92 32  ICA DISTAL 128 42     2.06 ICA / CCA Ratio (PSV) 2.16  Antegrade  Vertebral Flow Antegrade    Brachial Systolic Pressure (mmHg)   Multiphasic (Subclavian artery) Brachial Artery Waveforms Multiphasic (Subclavian artery)    Plaque Morphology:  HM = Homogeneous, HT = Heterogeneous, CP = Calcific Plaque, SP = Smooth Plaque, IP = Irregular Plaque  ADDITIONAL FINDINGS:     IMPRESSION: Bilateral internal carotid artery velocities suggest a 50-69% stenosis.     Compared to the previous exam:  Disease progression bilaterally.      Assessment: [name] is a 81 y.o. female who has no history of stroke or TIA. Today's carotid Duplex suggests 50-69% bilateral internal carotid artery stenosis. Disease progression bilaterally since 12/06/13. Fortunately she does not have DM and has never used tobacco. She has gotten out of the habit of regular walking/exercise since her left shoulder and arm were hurting before her aortic valve was replaced. This pain resolved with the surgery. Pt is advised to resume walking, gradually increase to a total of 30 minutes, at least 5 days/week.   Plan: Follow-up in 6 months with Carotid Duplex.   I discussed in depth with the patient the nature of atherosclerosis, and emphasized the importance of maximal medical management including strict control of blood pressure, blood glucose, and lipid levels, obtaining regular exercise, and continued cessation of smoking.  The patient is aware that without maximal medical management the underlying atherosclerotic disease process will progress,  limiting the benefit of any interventions. The patient was given information about stroke prevention and what symptoms should prompt the patient to seek immediate medical care. Thank you for allowing [place] to participate in this patient's care.  Rosalita Chessman Bena Kobel, RN, MSN, FNP-C [facility]:  [phone]  Clinic Physician: Edilia Bo  12/20/2014 1:12 PM

## 2015-01-05 ENCOUNTER — Other Ambulatory Visit: Payer: Self-pay | Admitting: Thoracic Surgery (Cardiothoracic Vascular Surgery)

## 2015-01-05 ENCOUNTER — Other Ambulatory Visit: Payer: Self-pay | Admitting: *Deleted

## 2015-01-05 DIAGNOSIS — I359 Nonrheumatic aortic valve disorder, unspecified: Secondary | ICD-10-CM

## 2015-01-05 MED ORDER — CLOPIDOGREL BISULFATE 75 MG PO TABS
75.0000 mg | ORAL_TABLET | Freq: Every day | ORAL | Status: DC
Start: 2015-01-05 — End: 2015-06-13

## 2015-01-25 DIAGNOSIS — Z961 Presence of intraocular lens: Secondary | ICD-10-CM | POA: Insufficient documentation

## 2015-02-06 ENCOUNTER — Telehealth: Payer: Self-pay | Admitting: Cardiology

## 2015-02-06 NOTE — Telephone Encounter (Signed)
Spoke with pt.  Verified medication she is taking is Plavix and NOT warfarin.  States her finger has stopped bleeding for now.

## 2015-02-06 NOTE — Telephone Encounter (Signed)
Spoke with pt, pt reports being started on warfarin after having surgery (TAVR) in march. She has not had her blood checked since that time. Pt reports taking warfarin in [place] even though it is not on her medication list. She is still getting an oozing of blood from a scrap on her two fingers. Advised pt to keep pressure on the area and apply ice to help stop the bleeding. Will make the [facility] clinic aware

## 2015-02-06 NOTE — Telephone Encounter (Signed)
Pt called in stating that she was put on coumadin back in April. Yesterday she states that she scrapped 2 of her fingers and today they are still bleeding . She would like to be advised on what to do.   Thanks

## 2015-04-16 IMAGING — CR DG CHEST 1V PORT
1 series · 1 of 1 positions shown · non-contrast
Comparison: September 01, 2014.

CLINICAL DATA: Status post transcatheter aortic valve replacement

EXAM:
PORTABLE CHEST - 1 VIEW

[portable]
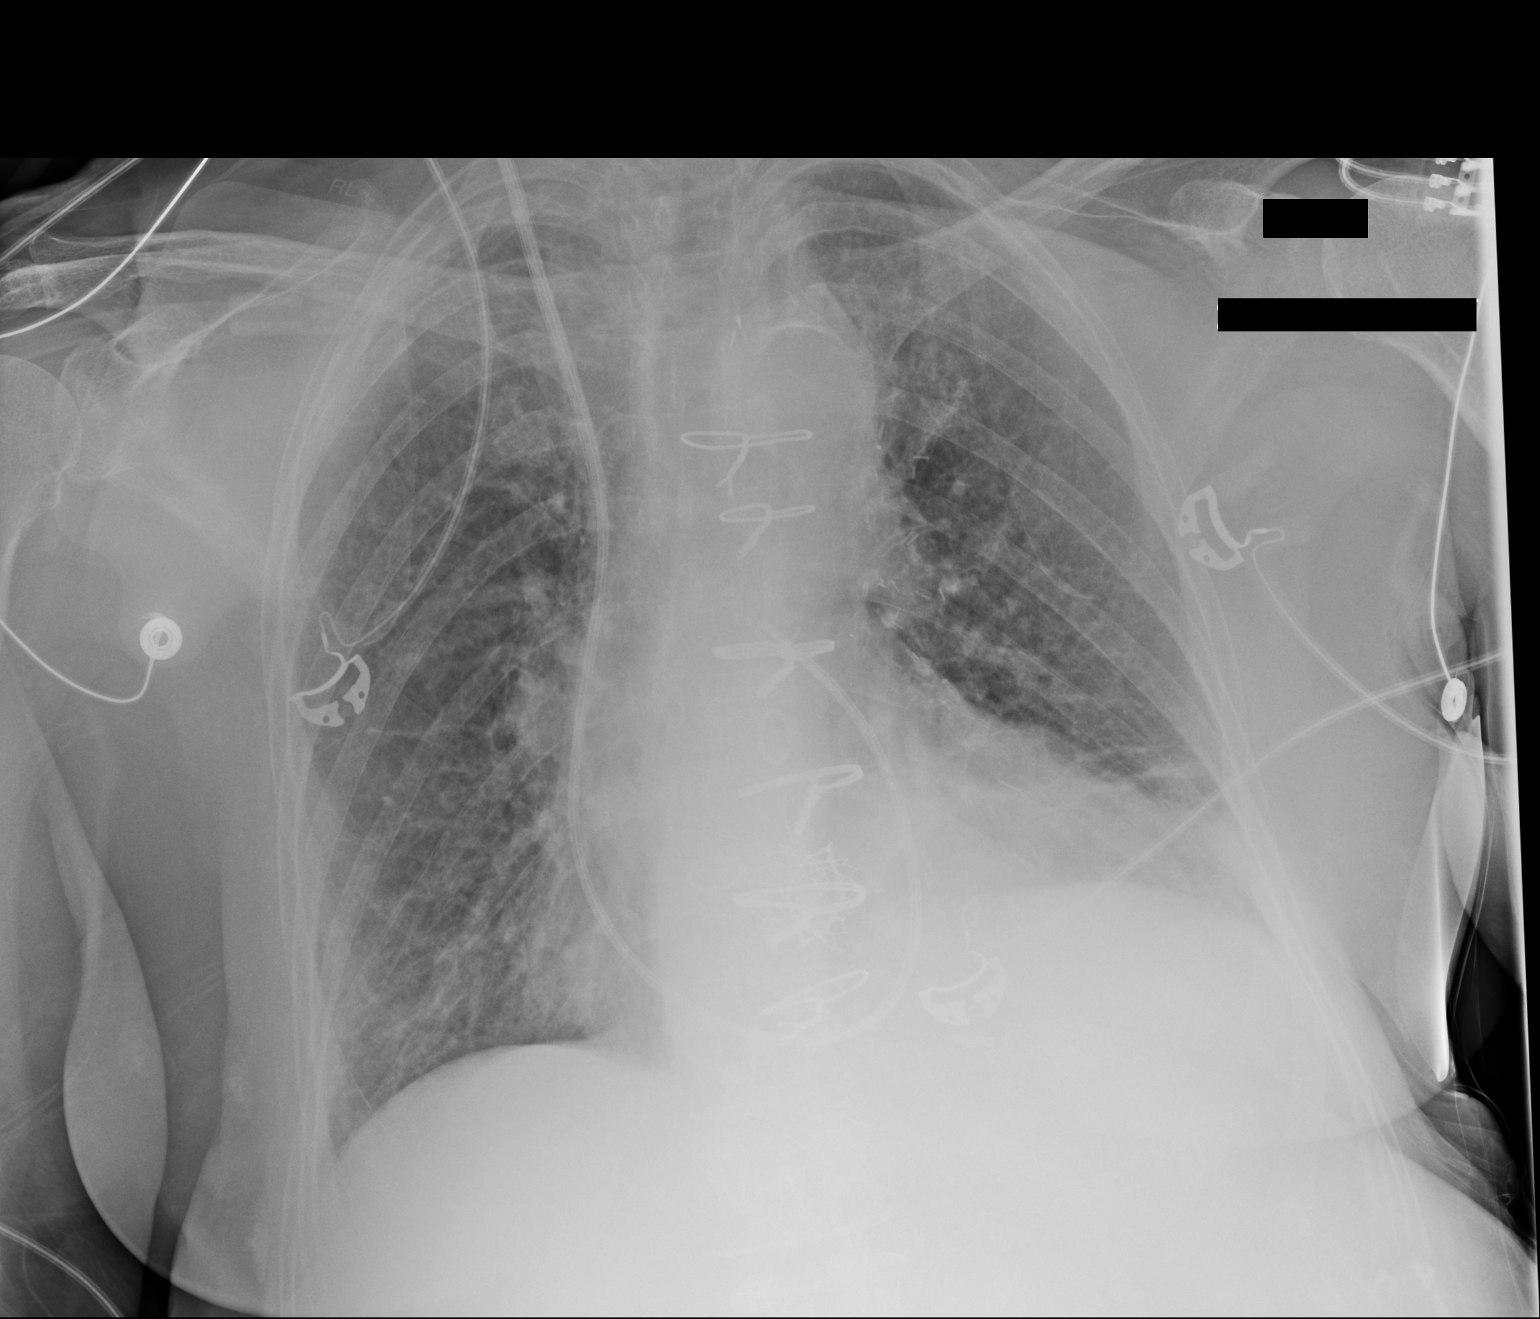

[1 of 1 positions shown; findings below may reference images not displayed]

FINDINGS: There is a Swan-Ganz catheter with the tip in the main pulmonary
outflow tract. No pneumothorax. There is atelectatic change in the
left base. Lungs are otherwise clear. Heart is mildly enlarged with
pulmonary vascularity within normal limits. There is postoperative
change in the left perihilar region, stable. There is a prosthetic
aortic valve as well as evidence of prior coronary artery bypass
grafting.
IMPRESSION: Swan-Ganz catheter tip in main pulmonary outflow tract. No
pneumothorax. No change in cardiac silhouette. Atelectasis left
base. No airspace consolidation.

## 2015-05-08 ENCOUNTER — Other Ambulatory Visit: Payer: Self-pay | Admitting: Cardiology

## 2015-05-14 ENCOUNTER — Other Ambulatory Visit: Payer: Self-pay | Admitting: Cardiology

## 2015-06-13 ENCOUNTER — Encounter: Payer: Self-pay | Admitting: Cardiology

## 2015-06-13 ENCOUNTER — Ambulatory Visit (INDEPENDENT_AMBULATORY_CARE_PROVIDER_SITE_OTHER): Payer: Medicare HMO | Admitting: Cardiology

## 2015-06-13 VITALS — BP 128/80 | HR 68 | Ht 61.0 in | Wt 152.6 lb

## 2015-06-13 DIAGNOSIS — Z951 Presence of aortocoronary bypass graft: Secondary | ICD-10-CM | POA: Diagnosis not present

## 2015-06-13 DIAGNOSIS — I350 Nonrheumatic aortic (valve) stenosis: Secondary | ICD-10-CM

## 2015-06-13 DIAGNOSIS — M25512 Pain in left shoulder: Secondary | ICD-10-CM

## 2015-06-13 NOTE — Patient Instructions (Signed)
Your physician recommends that you schedule a follow-up appointment in: April after Echo  Your physician has requested that you have an echocardiogram. Echocardiography is a painless test that uses sound waves to create images of your heart. It provides your doctor with information about the size and shape of your heart and how well your heart's chambers and valves are working. This procedure takes approximately one hour. There are no restrictions for this procedure. April 2017  Merry Christmas and Happy New Year!!!

## 2015-06-13 NOTE — Progress Notes (Signed)
HPI    The patient presents for follow-up after TAVR using a 20 mm Edwards Sapien valve via transfemoral. She had severe prosthetic valve stenosis having had a previous tissue valve in 2009.  She had follow-up 1 month echo which demonstrated a mild gradient and questionable mild perivalvular leak.   She initially had complete recovery from he left shoulder discomfort. However, this is slightly recurrent. She's starting to have a little this discomfort sporadically. He is not with activities. She does her usual activities including household chores without bringing on symptoms she's not having any palpitations, presyncope or syncope. She's not having any PND or orthopnea.   Allergies  Allergen Reactions  . Zetia [Ezetimibe] Other (See Comments)    DIZZINESS    Current Outpatient Prescriptions  Medication Sig Dispense Refill  . aspirin 81 MG tablet Take 81 mg by mouth daily.      Marland Kitchen atorvastatin (LIPITOR) 80 MG tablet Take 80 mg by mouth daily.    . benazepril (LOTENSIN) 20 MG tablet Take 20 mg by mouth daily.    . Calcium Carbonate (CALTRATE 600 PO) Take 1 tablet by mouth daily.     . hydrochlorothiazide (HYDRODIURIL) 25 MG tablet Take 1 tablet (25 mg total) by mouth daily. 90 tablet 1  . timolol (TIMOPTIC) 0.5 % ophthalmic solution Place 1 drop into both eyes 2 (two) times daily.    . Vitamin D, Ergocalciferol, (DRISDOL) 50000 UNITS CAPS Take 50,000 Units by mouth every 7 (seven) days. On Tuesday    . zolpidem (AMBIEN) 5 MG tablet Take 5 mg by mouth at bedtime as needed for sleep (sleep).     No current facility-administered medications for this visit.    Past Medical History  Diagnosis Date  . Aortic stenosis     a.  s/p tissue AVR at time of CABG in 2009;  b. Echo 04/2012: EF 55-60%, moderate AS (mean 34);  c. Echo 6/14: Mild LVH, mild focal basal septal hypertrophy, EF 55-60%, normal wall motion, grade 2 diastolic dysfunction, AVR with moderate aortic stenosis (mean 36) - consider  TEE if clinically indicated, mild AI, mild MR, PASP 44, ascending aorta mild to moderately dilated (consider CTA or MRA)  . Dyslipidemia   . Hypertension   . Spinal arthritis   . CAD (coronary artery disease)     a. s/p CABG; b. Myoview 06/2011: No ischemia, EF 67%;  c. 01/2013 Cath: LM min irregs, LAD small, LCX 147m OMs ok, RCA known 100, VG->RCA ok, VG->OM1->2 ok, LIMA->LAD ok.  Marland Kitchen Hx of CABG     LIMA-LAD, SVG-RCA, SVG-OM1/OM2 in 2009  . Blindness of right eye     due to retinal bleed  . Glaucoma   . Left bundle branch block   . Heart murmur   . Chronic kidney disease     renal insufficiency  . GERD (gastroesophageal reflux disease)   . Arthritis   . Blind right eye   . HOH (hard of hearing)   . S/P aortic valve replacement with bioprosthetic valve 07/28/2007    #79mm Sorin Mitroflow pericardial tissue valve    . S/P CABG x 4 07/28/2007    LIMA to LAD, SVG to RCA, sequential SVG to OM1-OM2  . Unstable angina (HCC) 01/14/2013  . Aortic stenosis, severe 07/03/2014    Recurrent, due to prosthetic valve stenosis   . Prosthetic valve dysfunction   . Renal insufficiency   . PONV (postoperative nausea and vomiting)     nausea  yrs ago  . Carotid stenosis     Carotid U/S 5/13:  bilat 40-59%  . Occlusion and stenosis of carotid artery without mention of cerebral infarction 11/28/2011  . Shortness of breath     occ  . History of hiatal hernia   . Cancer (HCC)     ovarian?  . S/P TAVR (transcatheter aortic valve replacement) 09/05/2014    20 mm Edwards Sapien 3 transcatheter heart valve placed via open right transfemoral approach for valve-in-valve replacement for prosthetic valve dysfunction    Past Surgical History  Procedure Laterality Date  . Coronary artery bypass graft  Jan 2009    LIMA to LAD, SVG to RCA, SVG to OM 1 & 2  . Aortic valve replacement  Jan 2009    #21 mm pericardial prosthesis  . Back surgery    . Neck surgery      cervical  . Abdominal hysterectomy    .  Cholecystectomy    . Cardiac catheterization  2014  . Appendectomy    . Posterior cervical laminectomy Left 11/04/2013    Procedure: Left Cervical Four-Five Foraminotomy ;  Surgeon: Temple PaciniHenry A Pool, MD;  Location: [facility];  Service: Neurosurgery;  Laterality: Left;  Left Cervical Four-Five Foraminotomy   . Left and right heart catheterization with coronary/graft angiogram N/A 01/13/2013    Procedure: LEFT AND RIGHT HEART CATHETERIZATION WITH Isabel CapriceORONARY/GRAFT ANGIOGRAM;  Surgeon: Rollene RotundaJames Alex Leahy, MD;  [facility];  Service: Cardiovascular;  Laterality: N/A;  . Left heart catheterization with coronary/graft angiogram N/A 07/03/2014    Procedure: LEFT HEART CATHETERIZATION WITH Isabel CapriceORONARY/GRAFT ANGIOGRAM;  Surgeon: Micheline ChapmanMichael D Cooper, MD;  [facility];  Service: Cardiovascular;  Laterality: N/A;  . Tee without cardioversion N/A 08/10/2014    Procedure: TRANSESOPHAGEAL ECHOCARDIOGRAM (TEE);  Surgeon: Lars MassonKatarina H Nelson, MD;  [facility];  Service: Cardiovascular;  Laterality: N/A;  . Eye surgery Right     retinal detachment blind  . Transcatheter aortic valve replacement, transfemoral N/A 09/05/2014    Procedure: TRANSCATHETER AORTIC VALVE REPLACEMENT, TRANSFEMORAL;  Surgeon: Micheline ChapmanMichael D Cooper, MD;  [facility];  Service: Open Heart Surgery;  Laterality: N/A;  . Tee without cardioversion N/A 09/05/2014    Procedure: TRANSESOPHAGEAL ECHOCARDIOGRAM (TEE);  Surgeon: Micheline ChapmanMichael D Cooper, MD;  [facility];  Service: Open Heart Surgery;  Laterality: N/A;    ROS: As stated in the HPI and negative for all other systems.  PHYSICAL EXAM BP 128/80 mmHg  Pulse 68  Ht 5\' 1"  (1.549 m)  Wt 152 lb 9.6 oz (69.219 kg)  BMI 28.85 kg/m2 GENERAL:  Well appearing HEENT: Right pupil fixed and lens opacified. Dentures.  NECK:  No jugular venous distention, waveform within normal limits, carotid upstroke brisk and symmetric, no bruits, no thyromegaly LYMPHATICS:  No cervical, inguinal  adenopathy LUNGS:  Clear to auscultation bilaterally CHEST:  Well healed sternotomy scar. HEART:  PMI not displaced or sustained,S1 and S2 within normal limits, no S3, no S4, no clicks, no rubs, early peaking systolic murmur radiating out the outflow tract, nodiastolic murmur, late peaking and radiating heart at the left 4th intercostal space.  ABD:  Flat, positive bowel sounds normal in frequency in pitch, no bruits, no rebound, no guarding, no midline pulsatile mass, no hepatomegaly, no splenomegaly EXT:  2 plus pulses throughout, no edema, no cyanosis no clubbing  EKG:  Sinus rhythm, rate 68, left axis deviation, left bundle branch block, no change from previous. 06/13/2015   ASSESSMENT AND PLAN  CAD (coronary artery disease) -  The patient has no new sypmtoms.  No further cardiovascular testing is indicated.  We will continue with aggressive risk reduction and meds as listed.  She had patent grafts at the last catheterization.   Hypertension -  The blood pressure is at target. No change in medications is indicated. We will continue with therapeutic lifestyle changes (TLC).  S/P aortic valve replacement with bioprosthetic valve -  She is s/p TAVR.  She is doing well no change in therapy is indicated.   I will repeat an echocardiogram in April and see her after that. She certainly to let me know should she have any increasing left shoulder pain.   Dyslipidemia -  This is managed per Tarri Fuller, MD  Carotid stenosis - She has nonobstructive carotid stenosis followed by  [facility].  I reviewed these records.  She had 50-60% bilateral stenosis in [place] and is due for follow-up of this in 6 months from that date.

## 2015-06-15 ENCOUNTER — Encounter: Payer: Self-pay | Admitting: Family

## 2015-06-22 ENCOUNTER — Ambulatory Visit (HOSPITAL_COMMUNITY)
Admission: RE | Admit: 2015-06-22 | Discharge: 2015-06-22 | Disposition: A | Discharge status: Home / Self Care | Payer: Medicare HMO | Source: Ambulatory Visit | Attending: Family | Admitting: Family

## 2015-06-22 ENCOUNTER — Encounter: Payer: Self-pay | Admitting: Family

## 2015-06-22 ENCOUNTER — Other Ambulatory Visit: Payer: Self-pay | Admitting: Family

## 2015-06-22 ENCOUNTER — Ambulatory Visit (INDEPENDENT_AMBULATORY_CARE_PROVIDER_SITE_OTHER): Payer: Medicare HMO | Admitting: Family

## 2015-06-22 VITALS — BP 143/83 | HR 67 | Temp 97.1°F | Resp 16 | Ht 61.0 in | Wt 152.0 lb

## 2015-06-22 DIAGNOSIS — I6523 Occlusion and stenosis of bilateral carotid arteries: Secondary | ICD-10-CM

## 2015-06-22 DIAGNOSIS — I129 Hypertensive chronic kidney disease with stage 1 through stage 4 chronic kidney disease, or unspecified chronic kidney disease: Secondary | ICD-10-CM | POA: Diagnosis not present

## 2015-06-22 DIAGNOSIS — N189 Chronic kidney disease, unspecified: Secondary | ICD-10-CM | POA: Diagnosis not present

## 2015-06-22 DIAGNOSIS — E785 Hyperlipidemia, unspecified: Secondary | ICD-10-CM | POA: Insufficient documentation

## 2015-06-22 NOTE — Progress Notes (Signed)
Chief Complaint: Extracranial Carotid Artery Stenosis   History of Present Illness  [name] is a 81 y.o. female patient of Dr. Darrick Penna followed for carotid artery stenosis with serial duplex. The patient denies any signs or symptoms of CVA, TIA, amaurosis fugax or any neural deficit.  Patient is legally blind in her right eye due to glaucoma. Pt states her sister had a stroke with severe neurological deficits.  Patient has not had previous carotid artery intervention. Pt denies any claudication symptoms in legs with walking.  Pt reports New Medical or Surgical History: September 05, 2014 aortic valve replacement, possibly bovine per husband, minimally invasive approach C-spine surgery May, 2015. She does not drink ETOH. Had a CABG in 2009, also had aortic valve replaced. She is now able to walk more since her heart valve was replaced but has not been since she is out of the habit; advised to gradually resume walking to about 30 minutes daily.  Pt Diabetic: No Pt smoker: non-smoker  Pt meds include: Statin : Yes ASA: Yes Other anticoagulants/antiplatelets: no    Past Medical History  Diagnosis Date  . Aortic stenosis     a.  s/p tissue AVR at time of CABG in 2009;  b. Echo 04/2012: EF 55-60%, moderate AS (mean 34);  c. Echo 6/14: Mild LVH, mild focal basal septal hypertrophy, EF 55-60%, normal wall motion, grade 2 diastolic dysfunction, AVR with moderate aortic stenosis (mean 36) - consider TEE if clinically indicated, mild AI, mild MR, PASP 44, ascending aorta mild to moderately dilated (consider CTA or MRA)  . Dyslipidemia   . Hypertension   . Spinal arthritis   . CAD (coronary artery disease)     a. s/p CABG; b. Myoview 06/2011: No ischemia, EF 67%;  c. 01/2013 Cath: LM min irregs, LAD small, LCX 172m OMs ok, RCA known 100, VG->RCA ok, VG->OM1->2 ok, LIMA->LAD ok.  Marland Kitchen Hx of CABG     LIMA-LAD, SVG-RCA, SVG-OM1/OM2 in 2009  . Blindness of right eye     due to retinal  bleed  . Glaucoma   . Left bundle branch block   . Chronic kidney disease     renal insufficiency  . GERD (gastroesophageal reflux disease)   . Arthritis   . HOH (hard of hearing)   . PONV (postoperative nausea and vomiting)     nausea  yrs ago  . Carotid stenosis     Carotid U/S 5/13:  bilat 40-59%  . History of hiatal hernia   . Ovarian cyst     Not clearly malignant but removed  . S/P TAVR (transcatheter aortic valve replacement) 09/05/2014    20 mm Edwards Sapien 3 transcatheter heart valve placed via open right transfemoral approach for valve-in-valve replacement for prosthetic valve dysfunction    Social History Social History  Substance Use Topics  . Smoking status: Never Smoker   . Smokeless tobacco: Never Used  . Alcohol Use: No    Family History Family History  Problem Relation Age of Onset  . Cancer Mother   . Heart attack Father   . Heart attack Brother     Surgical History Past Surgical History  Procedure Laterality Date  . Coronary artery bypass graft  Jan 2009    LIMA to LAD, SVG to RCA, SVG to OM 1 & 2  . Aortic valve replacement  Jan 2009    #21 mm pericardial prosthesis  . Back surgery    . Neck surgery  cervical  . Abdominal hysterectomy    . Cholecystectomy    . Cardiac catheterization  2014  . Appendectomy    . Posterior cervical laminectomy Left 11/04/2013    Procedure: Left Cervical Four-Five Foraminotomy ;  Surgeon: Temple PaciniHenry A Pool, MD;  Location: [facility];  Service: Neurosurgery;  Laterality: Left;  Left Cervical Four-Five Foraminotomy   . Left and right heart catheterization with coronary/graft angiogram N/A 01/13/2013    Procedure: LEFT AND RIGHT HEART CATHETERIZATION WITH Isabel CapriceORONARY/GRAFT ANGIOGRAM;  Surgeon: Rollene RotundaJames Hochrein, MD;  [facility];  Service: Cardiovascular;  Laterality: N/A;  . Left heart catheterization with coronary/graft angiogram N/A 07/03/2014    Procedure: LEFT HEART CATHETERIZATION WITH Isabel CapriceORONARY/GRAFT  ANGIOGRAM;  Surgeon: Micheline ChapmanMichael D Cooper, MD;  [facility];  Service: Cardiovascular;  Laterality: N/A;  . Tee without cardioversion N/A 08/10/2014    Procedure: TRANSESOPHAGEAL ECHOCARDIOGRAM (TEE);  Surgeon: Lars MassonKatarina H Nelson, MD;  [facility];  Service: Cardiovascular;  Laterality: N/A;  . Eye surgery Right     retinal detachment blind  . Transcatheter aortic valve replacement, transfemoral N/A 09/05/2014    Procedure: TRANSCATHETER AORTIC VALVE REPLACEMENT, TRANSFEMORAL;  Surgeon: Micheline ChapmanMichael D Cooper, MD;  [facility];  Service: Open Heart Surgery;  Laterality: N/A;  . Tee without cardioversion N/A 09/05/2014    Procedure: TRANSESOPHAGEAL ECHOCARDIOGRAM (TEE);  Surgeon: Micheline ChapmanMichael D Cooper, MD;  [facility];  Service: Open Heart Surgery;  Laterality: N/A;    Allergies  Allergen Reactions  . Zetia [Ezetimibe] Other (See Comments)    DIZZINESS    Current Outpatient Prescriptions  Medication Sig Dispense Refill  . aspirin 81 MG tablet Take 81 mg by mouth daily.      Marland Kitchen. atorvastatin (LIPITOR) 80 MG tablet Take 80 mg by mouth daily.    . benazepril (LOTENSIN) 20 MG tablet Take 20 mg by mouth daily.    . Calcium Carbonate (CALTRATE 600 PO) Take 1 tablet by mouth daily.     . hydrochlorothiazide (HYDRODIURIL) 25 MG tablet Take 1 tablet (25 mg total) by mouth daily. 90 tablet 1  . timolol (TIMOPTIC) 0.5 % ophthalmic solution Place 1 drop into both eyes 2 (two) times daily.    . Vitamin D, Ergocalciferol, (DRISDOL) 50000 UNITS CAPS Take 50,000 Units by mouth every 7 (seven) days. On Tuesday    . zolpidem (AMBIEN) 5 MG tablet Take 5 mg by mouth at bedtime as needed for sleep (sleep).     No current facility-administered medications for this visit.    Review of Systems : See HPI for pertinent positives and negatives.  Physical Examination  Filed Vitals:   06/22/15 1030 06/22/15 1034 06/22/15 1039  BP: 138/83 152/84 143/83  Pulse: 63 66 67  Temp:  97.1 F (36.2 C)    TempSrc:  Oral   Resp:  16   Height:  5\' 1"  (1.549 m)   Weight:  152 lb (68.947 kg)   SpO2:  96%    Body mass index is 28.74 kg/(m^2).  General: WDWN female in NAD GAIT: normal Eyes: Right pupil is non reactive Pulmonary: Non-labored, CTAB, Negative Rales, Negative rhonchi, & Negative wheezing.  Cardiac: regular Rhythm and rate, + murmur.  VASCULAR EXAM Carotid Bruits Left Right   Negative Negative    Radial pulses are 2+ palpable and equal.      LE Pulses LEFT RIGHT   POPLITEAL not palpable  not palpable   POSTERIOR TIBIAL 2+ palpable  1+ palpable  DORSALIS PEDIS  ANTERIOR TIBIAL 1+ palpable  1+ palpable     Gastrointestinal: soft, nontender, BS WNL, no r/g,no palpable masses.  Musculoskeletal: No muscle atrophy/wasting. M/S 5/5 throughout, Extremities without ischemic changes  Neurologic: A&O X 3; Appropriate Affect, Speech is normal CN 2-12 intact except right pupil is non reactive and clouded, Pain and light touch intact in extremities, Motor exam as listed above.                Non-Invasive Vascular Imaging CAROTID DUPLEX 06/22/2015   CEREBROVASCULAR DUPLEX EVALUATION    INDICATION: Carotid stenosis    PREVIOUS INTERVENTION(S): NA    DUPLEX EXAM:     RIGHT  LEFT  Peak Systolic Velocities (cm/s) End Diastolic Velocities (cm/s) Plaque LOCATION Peak Systolic Velocities (cm/s) End Diastolic Velocities (cm/s) Plaque  73 12  CCA PROXIMAL 79 14   69 15  CCA MID 71 13   81 24 HT CCA DISTAL 79 19   135 16 HT ECA 187 9 HT  149 51 HT ICA PROXIMAL 137 35 HT  184 58 HT ICA MID 97 36 HT  123 38  ICA DISTAL 130 44 HT    2.7 ICA / CCA Ratio (PSV) 1.9  Antegrade Vertebral Flow Antegrade  NA Brachial Systolic Pressure (mmHg) NA  NA Brachial Artery Waveforms NA     Plaque Morphology:  HM = Homogeneous, HT = Heterogeneous, CP = Calcific Plaque, SP = Smooth Plaque, IP = Irregular Plaque     ADDITIONAL FINDINGS: Right subclavian artery PSV84cm/sec; Left subclavian artery PSV116cm/sec    IMPRESSION: Right internal carotid artery stenosis present in the 40%-59% range. Left internal carotid artery stenosis present in the less than 40% range.    Compared to the previous exam:  Velocities essentially unchanged since previous study on 12/20/2014.       Assessment: [name] is a 81 y.o. female who has no hx of stroke or TIA. Today's carotid duplex suggests 40-59% right ICA stenosis and <40% left ICA stenosis. Velocities essentially unchanged since previous study on 12/20/2014.    Plan: Follow-up in 1 year with Carotid Duplex scan.   I discussed in depth with the patient the nature of atherosclerosis, and emphasized the importance of maximal medical management including strict control of blood pressure, blood glucose, and lipid levels, obtaining regular exercise, and continued cessation of smoking.  The patient is aware that without maximal medical management the underlying atherosclerotic disease process will progress, limiting the benefit of any interventions. The patient was given information about stroke prevention and what symptoms should prompt the patient to seek immediate medical care. Thank you for allowing [place] to participate in this patient's care.  Charisse March, RN, MSN, FNP-C [facility] Office: [phone]  Clinic Physician: Imogene Burn  06/22/2015 11:04 AM

## 2015-06-22 NOTE — Progress Notes (Signed)
Filed Vitals:   06/22/15 1030 06/22/15 1034 06/22/15 1039  BP: 138/83 152/84 143/83  Pulse: 63 66 67  Temp:  97.1 F (36.2 C)   TempSrc:  Oral   Resp:  16   Height:  5\' 1"  (1.549 m)   Weight:  152 lb (68.947 kg)   SpO2:  96%

## 2015-06-22 NOTE — Patient Instructions (Signed)
Stroke Prevention Some medical conditions and behaviors are associated with an increased chance of having a stroke. You may prevent a stroke by making healthy choices and managing medical conditions. HOW CAN I REDUCE MY RISK OF HAVING A STROKE?   Stay physically active. Get at least 30 minutes of activity on most or all days.  Do not smoke. It may also be helpful to avoid exposure to secondhand smoke.  Limit alcohol use. Moderate alcohol use is considered to be:  No more than 2 drinks per day for men.  No more than 1 drink per day for nonpregnant women.  Eat healthy foods. This involves:  Eating 5 or more servings of fruits and vegetables a day.  Making dietary changes that address high blood pressure (hypertension), high cholesterol, diabetes, or obesity.  Manage your cholesterol levels.  Making food choices that are high in fiber and low in saturated fat, trans fat, and cholesterol may control cholesterol levels.  Take any prescribed medicines to control cholesterol as directed by your health care provider.  Manage your diabetes.  Controlling your carbohydrate and sugar intake is recommended to manage diabetes.  Take any prescribed medicines to control diabetes as directed by your health care provider.  Control your hypertension.  Making food choices that are low in salt (sodium), saturated fat, trans fat, and cholesterol is recommended to manage hypertension.  Ask your health care provider if you need treatment to lower your blood pressure. Take any prescribed medicines to control hypertension as directed by your health care provider.  If you are 18-39 years of age, have your blood pressure checked every 3-5 years. If you are 40 years of age or older, have your blood pressure checked every year.  Maintain a healthy weight.  Reducing calorie intake and making food choices that are low in sodium, saturated fat, trans fat, and cholesterol are recommended to manage  weight.  Stop drug abuse.  Avoid taking birth control pills.  Talk to your health care provider about the risks of taking birth control pills if you are over 35 years old, smoke, get migraines, or have ever had a blood clot.  Get evaluated for sleep disorders (sleep apnea).  Talk to your health care provider about getting a sleep evaluation if you snore a lot or have excessive sleepiness.  Take medicines only as directed by your health care provider.  For some people, aspirin or blood thinners (anticoagulants) are helpful in reducing the risk of forming abnormal blood clots that can lead to stroke. If you have the irregular heart rhythm of atrial fibrillation, you should be on a blood thinner unless there is a good reason you cannot take them.  Understand all your medicine instructions.  Make sure that other conditions (such as anemia or atherosclerosis) are addressed. SEEK IMMEDIATE MEDICAL CARE IF:   You have sudden weakness or numbness of the face, arm, or leg, especially on one side of the body.  Your face or eyelid droops to one side.  You have sudden confusion.  You have trouble speaking (aphasia) or understanding.  You have sudden trouble seeing in one or both eyes.  You have sudden trouble walking.  You have dizziness.  You have a loss of balance or coordination.  You have a sudden, severe headache with no known cause.  You have new chest pain or an irregular heartbeat. Any of these symptoms may represent a serious problem that is an emergency. Do not wait to see if the symptoms will   go away. Get medical help at once. Call your local emergency services (911 in [place] Do not drive yourself to the hospital.   This information is not intended to replace advice given to you by your health care provider. Make sure you discuss any questions you have with your health care provider.   Document Released: 07/31/2004 Document Revised: 07/14/2014 Document Reviewed:  12/24/2012 Elsevier Interactive Patient Education 2016 [employer]  

## 2015-08-20 ENCOUNTER — Other Ambulatory Visit: Payer: Self-pay | Admitting: *Deleted

## 2015-08-20 MED ORDER — ATORVASTATIN CALCIUM 80 MG PO TABS: 80.0000 mg | ORAL_TABLET | Freq: Every day | ORAL | Status: DC

## 2015-09-03 ENCOUNTER — Other Ambulatory Visit: Payer: Self-pay | Admitting: *Deleted

## 2015-09-03 DIAGNOSIS — Z953 Presence of xenogenic heart valve: Secondary | ICD-10-CM

## 2015-09-05 ENCOUNTER — Other Ambulatory Visit: Payer: Self-pay

## 2015-09-05 DIAGNOSIS — I350 Nonrheumatic aortic (valve) stenosis: Secondary | ICD-10-CM

## 2015-09-05 DIAGNOSIS — Z952 Presence of prosthetic heart valve: Secondary | ICD-10-CM

## 2015-09-11 NOTE — Telephone Encounter (Signed)
, °

## 2015-09-21 ENCOUNTER — Ambulatory Visit (HOSPITAL_COMMUNITY): Payer: Medicare HMO | Attending: Internal Medicine

## 2015-09-21 ENCOUNTER — Other Ambulatory Visit: Payer: Self-pay

## 2015-09-21 ENCOUNTER — Encounter: Payer: Self-pay | Admitting: Cardiovascular Disease

## 2015-09-21 ENCOUNTER — Ambulatory Visit (INDEPENDENT_AMBULATORY_CARE_PROVIDER_SITE_OTHER): Payer: Medicare HMO | Admitting: Cardiovascular Disease

## 2015-09-21 VITALS — BP 120/64 | HR 68 | Ht 61.0 in | Wt 152.0 lb

## 2015-09-21 DIAGNOSIS — Z952 Presence of prosthetic heart valve: Secondary | ICD-10-CM

## 2015-09-21 DIAGNOSIS — I071 Rheumatic tricuspid insufficiency: Secondary | ICD-10-CM | POA: Insufficient documentation

## 2015-09-21 DIAGNOSIS — I340 Nonrheumatic mitral (valve) insufficiency: Secondary | ICD-10-CM | POA: Diagnosis not present

## 2015-09-21 DIAGNOSIS — I350 Nonrheumatic aortic (valve) stenosis: Secondary | ICD-10-CM | POA: Diagnosis present

## 2015-09-21 DIAGNOSIS — I359 Nonrheumatic aortic valve disorder, unspecified: Secondary | ICD-10-CM | POA: Diagnosis not present

## 2015-09-21 DIAGNOSIS — I119 Hypertensive heart disease without heart failure: Secondary | ICD-10-CM | POA: Insufficient documentation

## 2015-09-21 DIAGNOSIS — Z954 Presence of other heart-valve replacement: Secondary | ICD-10-CM

## 2015-09-21 NOTE — Progress Notes (Signed)
Cardiology Office Note Date:  09/21/2015   ID:  [name], DOB 1933/01/08, MRN [id number]  PCP:  Tarri Fuller, MD  Cardiologist:  Tonny Bollman, MD    No chief complaint on file.    History of Present Illness: [name] is a 82 y.o. female who presents for one year follow-up status post valve-in-valve transcatheter aortic valve replacement using a 20 mm Edwards Sapien S3 transcatheter heart valve via open right transfemoral approach on 09/05/2014 for severe prosthetic valve disease, originally s/p aortic valve replacement using a 21 mm Sorin Mitroflow bioprosthetic tissue valve in 2009.  The patient is doing well. She does have occasional left shoulder pain associated with exertion, but much improved since undergoing TAVR. This was her previous anginal equivalent and has been her primary symptom associated with aortic stenosis. She is now most limited by joint and muscle pains. She denies chest pain or pressure. She has mild shortness of breath with exertion. No orthopnea, PND, or leg swelling   Past Medical History  Diagnosis Date  . Aortic stenosis     a.  s/p tissue AVR at time of CABG in 2009;  b. Echo 04/2012: EF 55-60%, moderate AS (mean 34);  c. Echo 6/14: Mild LVH, mild focal basal septal hypertrophy, EF 55-60%, normal wall motion, grade 2 diastolic dysfunction, AVR with moderate aortic stenosis (mean 36) - consider TEE if clinically indicated, mild AI, mild MR, PASP 44, ascending aorta mild to moderately dilated (consider CTA or MRA)  . Dyslipidemia   . Hypertension   . Spinal arthritis   . CAD (coronary artery disease)     a. s/p CABG; b. Myoview 06/2011: No ischemia, EF 67%;  c. 01/2013 Cath: LM min irregs, LAD small, LCX 123m OMs ok, RCA known 100, VG->RCA ok, VG->OM1->2 ok, LIMA->LAD ok.  Marland Kitchen Hx of CABG     LIMA-LAD, SVG-RCA, SVG-OM1/OM2 in 2009  . Blindness of right eye     due to retinal bleed  . Glaucoma   . Left bundle branch block   . Chronic kidney  disease     renal insufficiency  . GERD (gastroesophageal reflux disease)   . Arthritis   . HOH (hard of hearing)   . PONV (postoperative nausea and vomiting)     nausea  yrs ago  . Carotid stenosis     Carotid U/S 5/13:  bilat 40-59%  . History of hiatal hernia   . Ovarian cyst     Not clearly malignant but removed  . S/P TAVR (transcatheter aortic valve replacement) 09/05/2014    20 mm Edwards Sapien 3 transcatheter heart valve placed via open right transfemoral approach for valve-in-valve replacement for prosthetic valve dysfunction    Past Surgical History  Procedure Laterality Date  . Coronary artery bypass graft  Jan 2009    LIMA to LAD, SVG to RCA, SVG to OM 1 & 2  . Aortic valve replacement  Jan 2009    #21 mm pericardial prosthesis  . Back surgery    . Neck surgery      cervical  . Abdominal hysterectomy    . Cholecystectomy    . Cardiac catheterization  2014  . Appendectomy    . Posterior cervical laminectomy Left 11/04/2013    Procedure: Left Cervical Four-Five Foraminotomy ;  Surgeon: Temple Pacini, MD;  Location: [facility];  Service: Neurosurgery;  Laterality: Left;  Left Cervical Four-Five Foraminotomy   . Left and right heart catheterization with coronary/graft angiogram  N/A 01/13/2013    Procedure: LEFT AND RIGHT HEART CATHETERIZATION WITH Isabel Caprice;  Surgeon: [name], MD;  Location: [facility] CATH LAB;  Service: Cardiovascular;  Laterality: N/A;  . Left heart catheterization with coronary/graft angiogram N/A 07/03/2014    Procedure: LEFT HEART CATHETERIZATION WITH Isabel Caprice;  Surgeon: Micheline Chapman, MD;  Location: [facility] CATH LAB;  Service: Cardiovascular;  Laterality: N/A;  . Tee without cardioversion N/A 08/10/2014    Procedure: TRANSESOPHAGEAL ECHOCARDIOGRAM (TEE);  Surgeon: Lars Masson, MD;  Location: [facility] ENDOSCOPY;  Service: Cardiovascular;  Laterality: N/A;  . Eye surgery Right     retinal detachment blind  . Transcatheter  aortic valve replacement, transfemoral N/A 09/05/2014    Procedure: TRANSCATHETER AORTIC VALVE REPLACEMENT, TRANSFEMORAL;  Surgeon: Micheline Chapman, MD;  Location: [facility] OR;  Service: Open Heart Surgery;  Laterality: N/A;  . Tee without cardioversion N/A 09/05/2014    Procedure: TRANSESOPHAGEAL ECHOCARDIOGRAM (TEE);  Surgeon: Micheline Chapman, MD;  Location: [facility] OR;  Service: Open Heart Surgery;  Laterality: N/A;    Current Outpatient Prescriptions  Medication Sig Dispense Refill  . amLODipine (NORVASC) 2.5 MG tablet Take 1 tablet by mouth daily.    Marland Kitchen atorvastatin (LIPITOR) 80 MG tablet Take 1 tablet (80 mg total) by mouth daily. 90 tablet 0  . hydrochlorothiazide (HYDRODIURIL) 25 MG tablet Take 1 tablet (25 mg total) by mouth daily. 90 tablet 1  . timolol (TIMOPTIC) 0.5 % ophthalmic solution Place 1 drop into both eyes 2 (two) times daily.    . Vitamin D, Ergocalciferol, (DRISDOL) 50000 UNITS CAPS Take 50,000 Units by mouth every 7 (seven) days. On Tuesday    . zolpidem (AMBIEN) 5 MG tablet Take 5 mg by mouth at bedtime as needed for sleep (sleep).     No current facility-administered medications for this visit.    Allergies:   Zetia   Social History:  The patient  reports that she has never smoked. She has never used smokeless tobacco. She reports that she does not drink alcohol or use illicit drugs.   Family History:  The patient's  family history includes Cancer in her mother; Heart attack in her brother and father.    ROS:  Please see the history of present illness.  Otherwise, review of systems is positive for fatigue, hearing loss, vision change, cough, muscle pain, dizziness, headaches, easy bruising.  All other systems are reviewed and negative.    PHYSICAL EXAM: VS:  BP 120/64 mmHg  Pulse 68  Ht  (1.549 m)  Wt 152 lb (68.947 kg)  BMI 28.74 kg/m2 , BMI Body mass index is 28.74 kg/(m^2). GEN: Well nourished, well developed, pleasant elderly woman in no acute distress HEENT:  normal Neck: no JVD, no masses. No carotid bruits Cardiac: RRR with 2/6 SEM at the RUSB           Respiratory:  clear to auscultation bilaterally, normal work of breathing GI: soft, nontender, nondistended, + BS MS: no deformity or atrophy Ext: no pretibial edema, pedal pulses 2+= bilaterally Skin: warm and dry, no rash Neuro:  Strength and sensation are intact Psych: euthymic mood, full affect  EKG:  EKG is not ordered today.   Recent Labs: No results found for requested labs within last 365 days.   Lipid Panel     Component Value Date/Time   CHOL 175 05/10/2013 1005   TRIG 198.0* 05/10/2013 1005   HDL 37.40* 05/10/2013 1005   CHOLHDL 5 05/10/2013 1005   VLDL  39.6 05/10/2013 1005   LDLCALC 98 05/10/2013 1005   LDLDIRECT 126.2 09/12/2011 0829      Wt Readings from Last 3 Encounters:  09/21/15 152 lb (68.947 kg)  06/22/15 152 lb (68.947 kg)  06/13/15 152 lb 9.6 oz (69.219 kg)     Cardiac Studies Reviewed: Cardiac Cath 07-03-2014: Procedural Findings: Hemodynamics: AO 196/25 LV 162/56 with a mean of 90 Aortic mean gradient 39 mmHg  Coronary angiography: Coronary dominance: right  Left mainstem: The left main stem is ectatic. There is heavy calcification without obstructive disease.  Left anterior descending (LAD): The LAD is severely calcified. The vessel has 90% proximal stenosis. The first diagonal is seen to fill faintly. The mid and distal LAD fill from the mammary graft.  Left circumflex (LCx): The left circumflex appears to have severe ostial stenosis. This is estimated at 80%. The mid circumflex is totally occluded. The obtuse marginal branches fill entirely from a sequential saphenous vein graft.  Right coronary artery (RCA): Severe calcification with total occlusion proximally.  LIMA to LAD: Widely patent to the mid LAD. The mid and distal LAD are small in caliber. The proximal LAD fills retrograde from the graft and this fills the diagonal  branch.  Saphenous vein graft sequential to OM1 and OM 2: Widely patent with graft ectasia noted. There are diffuse irregularities. There are no significant stenoses. Multiple OM subbranch is fill from this bypass graft.  Saphenous vein graft to distal RCA: Patent throughout. No significant stenosis identified. The native RCA fills retrograde from the graft. The PDA and PLA branches fill from the graft.  Left ventriculography: Ventriculography was deferred because of kidney disease. Plain fluoroscopy demonstrates severe calcification and restricted mobility of prosthetic aortic valve leaflets.  Estimated Blood Loss: Minimal  Final Conclusions:  1. Severe native three-vessel coronary artery disease 2. Status post multivessel CABG with continued patency of the LIMA to LAD, sequential saphenous vein graft to OM1 and OM 2, and saphenous vein graft to distal RCA 3. Severe bioprosthetic aortic valve stenosis  Recommendations: TCTS evaluation for redo AVR versus valve-in-valve TAVR.   Echo 09/21/2015: Study Conclusions  - Left ventricle: The cavity size was normal. Wall thickness was  increased in a pattern of moderate LVH. Systolic function was  normal. The estimated ejection fraction was in the range of 60%  to 65%. Wall motion was normal; there were no regional wall  motion abnormalities. Doppler parameters are consistent with  abnormal left ventricular relaxation (grade 1 diastolic  dysfunction). The E/e&' ratio is between 8-15, suggesting elevated  LV filling pressure. - Aortic valve: S/p Aortic stent valve. Gradients are higher than  typically expected. No paravalvular leak. No obstruction - AT  <100 msec, DVI >0.25. - Mitral valve: Calcified annulus. Mildly thickened leaflets .  There was trivial regurgitation. - Left atrium: The atrium was normal in size. - Tricuspid valve: There was mild regurgitation. - Pulmonary arteries: PA peak pressure: 34 mm Hg (S). - Inferior  vena cava: The vessel was normal in size. The  respirophasic diameter changes were in the normal range (>= 50%),  consistent with normal central venous pressure.  Impressions:  - Compared to the prior echo in 10/2014, there is now a  bioprosthetic &quot;Stent&quot; valve in the aortic position with higher  than typical mean gradient of 21 mmHg, however, there is no  obstruction. LVEF 60-65%.  ASSESSMENT AND PLAN: Aortic valve disease, one year s/p Valve-in-valve TAVR: NYHA II, improved from previous symptoms. Echo is reviewed and  shows acceptable gradients considering valve-in-valve approach with a small surgical prosthesis. There is no AI present. Would repeat an echo in one year. Further follow-up with Dr Antoine Poche.   Current medicines are reviewed with the patient today.  The patient does not have concerns regarding medicines.  Labs/ tests ordered today include:  No orders of the defined types were placed in this encounter.   Disposition:   FU 6 months with Dr Antoine Poche  Signed, Tonny Bollman, MD  09/21/2015 9:29 AM    [facility] [address] [place], [place], [place]  [address] Phone: [phone]; Fax: [phone]

## 2015-09-21 NOTE — Patient Instructions (Signed)
Medication Instructions:  Your physician recommends that you continue on your current medications as directed. Please refer to the Current Medication list given to you today.  Labwork: No new orders.   Testing/Procedures: No new orders.   Follow-Up: Your physician wants you to follow-up in: 6 MONTHS with Dr Antoine PocheHochrein.  You will receive a reminder letter in the mail two months in advance. If you don't receive a letter, please call our office to schedule the follow-up appointment.   Any Other Special Instructions Will Be Listed Below (If Applicable).     If you need a refill on your cardiac medications before your next appointment, please call your pharmacy.

## 2015-09-28 DIAGNOSIS — M5416 Radiculopathy, lumbar region: Secondary | ICD-10-CM | POA: Insufficient documentation

## 2015-09-28 DIAGNOSIS — E559 Vitamin D deficiency, unspecified: Secondary | ICD-10-CM | POA: Insufficient documentation

## 2015-09-28 DIAGNOSIS — H903 Sensorineural hearing loss, bilateral: Secondary | ICD-10-CM | POA: Insufficient documentation

## 2015-09-28 DIAGNOSIS — F5101 Primary insomnia: Secondary | ICD-10-CM | POA: Insufficient documentation

## 2015-09-28 DIAGNOSIS — K589 Irritable bowel syndrome without diarrhea: Secondary | ICD-10-CM | POA: Insufficient documentation

## 2015-09-28 DIAGNOSIS — E782 Mixed hyperlipidemia: Secondary | ICD-10-CM | POA: Insufficient documentation

## 2015-09-28 DIAGNOSIS — I6529 Occlusion and stenosis of unspecified carotid artery: Secondary | ICD-10-CM | POA: Insufficient documentation

## 2015-10-01 ENCOUNTER — Telehealth: Payer: Self-pay | Admitting: Cardiology

## 2015-10-01 ENCOUNTER — Ambulatory Visit: Payer: Medicare HMO | Admitting: Thoracic Surgery (Cardiothoracic Vascular Surgery)

## 2015-10-01 NOTE — Telephone Encounter (Signed)
Recommendations given, pt voiced understanding and thanks.

## 2015-10-01 NOTE — Telephone Encounter (Signed)
For now, I would probably recommend keeping the legs elevated as much as possible and call us if still a problem tomorrow.

## 2015-10-01 NOTE — Telephone Encounter (Signed)
I returned call. The patient states that from ankles to mid calves, she has bruising/darkening of skin.  She first noted Saturday evening when in the bath, states it was very dark. On observation today: "looks a little bit better than it did yesterday, I think".  Notes soreness in legs - no sharp pain, no tingling or numbness. She planted flowers, was up on feet, but "no more than usual". She denies acute injury. She denies history of circulation problems. Notes she was on coumadin for heart valve surgery, but it has been more than a year since she's been on blood thinners.  Pt aware I will defer to physician for advice.

## 2015-10-01 NOTE — Telephone Encounter (Signed)
New Message  Pt c/o of legs turning black from ankles to knees- both legs- starting sat- 3/25- pt stated there was no pain or numbness. Please call back and discuss.

## 2015-12-27 ENCOUNTER — Other Ambulatory Visit: Payer: Self-pay | Admitting: Neurosurgery

## 2015-12-31 ENCOUNTER — Telehealth: Payer: Self-pay | Admitting: *Deleted

## 2015-12-31 NOTE — Telephone Encounter (Signed)
Requesting surgical clearance:   1. Type of surgery: C-4-5 Anterior Cervical Fusion  2. Surgeon: Dr Julio SicksHenry Pool  3. Surgical date: 01/03/2016  4. Medications that need to be help: Aspirin  5. [facility]: (P) [phone] (F) [phone]  Pt saw you last on 06/715 and Dr Excell Seltzerooper on 09/21/15, Is pt cleared for Surgery?

## 2015-12-31 NOTE — Pre-Procedure Instructions (Addendum)
[name]  12/31/2015      [facility] - [place], [place] - [id number] [place] [id number] Phone: [phone] Fax: [phone]  [facility] - [place] - [id number] [place] [address] Phone: [phone] Fax: [phone]  [facility] - [place], [place] - [address] [place] [address] Phone: [phone] Fax: [phone]    Your procedure is scheduled on  S[place] 29th, Thursday   [facility] 6:00 AM             (Posted surgery time 8:00 am - 9:41 am)   Call this number if you have problems the morning of surgery:  [phone]   Remember:  Do not eat food or drink liquids after midnight Wednesday.   Take these medicines the morning of surgery with A SIP OF WATER :amlodipine( Norvasc.)  Timolol(Timoptic) for your eye  STOP all herbel meds, nsaids (aleve,naproxen,advil,ibuprofen) NOW including aspirin,calcium,vitamins   Do not wear jewelry, make-up or nail polish.  Do not wear lotions, powders, or perfumes.     Do not shave 48 hours prior to surgery.    Do not bring valuables to the hospital.  [facility] is not responsible for any belongings or valuables.  Contacts, dentures or bridgework may not be worn into surgery.  Leave your suitcase in the car.  After surgery it may be brought to your room. For patients admitted to the hospital, discharge time will be determined by your treatment team.  Name and phone number of your driver:      Special Instructions: [facility] - Preparing for Surgery  Before surgery, you can play an important role.  Because skin is not sterile, your skin needs to be as free of germs as possible.  You can reduce the number of germs on you skin by washing with CHG (chlorahexidine gluconate) soap before surgery.  CHG is an antiseptic cleaner which kills germs and bonds with the skin  to continue killing germs even after washing.  Please DO NOT use if you have an allergy to CHG or antibacterial soaps.  If your skin becomes reddened/irritated stop using the CHG and inform your nurse when you arrive at Short Stay.  Do not shave (including legs and underarms) for at least 48 hours prior to the first CHG shower.  You may shave your face.  Please follow these instructions carefully:   1.  Shower with CHG Soap the night before surgery and the morning of Surgery.  2.  If you choose to wash your hair, wash your hair first as usual with your normal shampoo.  3.  After you shampoo, rinse your hair and body thoroughly to remove the Shampoo.  4.  Use CHG as you would any other liquid soap.  You can apply chg directly  to the skin and wash gently with scrungie or a clean washcloth.  5.  Apply the CHG Soap to your body ONLY FROM THE NECK DOWN.  Do not use on open wounds or open sores.  Avoid contact with your eyes ears, mouth and genitals (private parts).  Wash genitals (private parts)       with your normal soap.  6.  Wash thoroughly, paying special attention to the area where your surgery will be performed.  7.  Thoroughly rinse your body with warm water from the neck down.  8.  DO NOT shower/wash with your normal soap after using and rinsing off the CHG Soap.  9.  Pat yourself dry with a clean towel.            10.  Wear clean pajamas.            11.  Place clean sheets on your bed the night of your first shower and do not sleep with pets.  Day of Surgery  Do not apply any lotions/deodorants the morning of surgery.  Please wear clean clothes to the hospital/surgery center.    Please read over the following fact sheets that you were given. Pain Booklet and Surgical Site Infection Prevention

## 2016-01-01 ENCOUNTER — Other Ambulatory Visit: Payer: Self-pay

## 2016-01-01 ENCOUNTER — Encounter (HOSPITAL_COMMUNITY)
Admission: RE | Admit: 2016-01-01 | Discharge: 2016-01-01 | Disposition: A | Discharge status: Home / Self Care | Payer: Medicare HMO | Source: Ambulatory Visit | Attending: Neurosurgery | Admitting: Neurosurgery

## 2016-01-01 ENCOUNTER — Telehealth: Payer: Self-pay | Admitting: Cardiology

## 2016-01-01 ENCOUNTER — Encounter (HOSPITAL_COMMUNITY): Payer: Self-pay

## 2016-01-01 LAB — CBC WITH DIFFERENTIAL/PLATELET
BASOS PCT: 1 %
Basophils Absolute: 0.1 10*3/uL (ref 0.0–0.1)
EOS ABS: 0.2 10*3/uL (ref 0.0–0.7)
EOS PCT: 2 %
HCT: 37.4 % (ref 36.0–46.0)
HEMOGLOBIN: 11.9 g/dL — AB (ref 12.0–15.0)
Lymphocytes Relative: 26 %
Lymphs Abs: 2.3 10*3/uL (ref 0.7–4.0)
MCH: 26.3 pg (ref 26.0–34.0)
MCHC: 31.8 g/dL (ref 30.0–36.0)
MCV: 82.6 fL (ref 78.0–100.0)
Monocytes Absolute: 0.5 10*3/uL (ref 0.1–1.0)
Monocytes Relative: 6 %
NEUTROS PCT: 65 %
Neutro Abs: 5.7 10*3/uL (ref 1.7–7.7)
PLATELETS: 220 10*3/uL (ref 150–400)
RBC: 4.53 MIL/uL (ref 3.87–5.11)
RDW: 15 % (ref 11.5–15.5)
WBC: 8.7 10*3/uL (ref 4.0–10.5)

## 2016-01-01 LAB — BASIC METABOLIC PANEL
ANION GAP: 9 (ref 5–15)
BUN: 23 mg/dL — ABNORMAL HIGH (ref 6–20)
CALCIUM: 10.1 mg/dL (ref 8.9–10.3)
CO2: 23 mmol/L (ref 22–32)
CREATININE: 1.49 mg/dL — AB (ref 0.44–1.00)
Chloride: 105 mmol/L (ref 101–111)
GFR, EST AFRICAN AMERICAN: 37 mL/min — AB (ref >60)
GFR, EST NON AFRICAN AMERICAN: 32 mL/min — AB (ref >60)
GLUCOSE: 107 mg/dL — AB (ref 65–99)
Potassium: 3.7 mmol/L (ref 3.5–5.1)
Sodium: 137 mmol/L (ref 135–145)

## 2016-01-01 LAB — SURGICAL PCR SCREEN
MRSA, PCR: NEGATIVE
STAPHYLOCOCCUS AUREUS: NEGATIVE

## 2016-01-01 NOTE — Telephone Encounter (Signed)
Called - No answer, no voice mail set up.

## 2016-01-01 NOTE — Telephone Encounter (Signed)
The patient has had no recent symptoms.   She has a good functional level.  She is not going for a high risk procedure. Therefore, based on ACC/AHA guidelines, the patient would be at acceptable risk for the planned procedure without further cardiovascular testing.

## 2016-01-01 NOTE — Telephone Encounter (Signed)
See other note. Called patient and gave her notification that I have sent clearance note to Dr. Lindalou HosePool's office. Instructed to follow up w their office for further recommendations and return to see Dr. Antoine PocheHochrein as sched 03/28/16 8am. Pt voiced understanding and thanks.

## 2016-01-01 NOTE — Telephone Encounter (Signed)
Notification faxed to Dr. Lindalou HosePool's office

## 2016-01-01 NOTE — Telephone Encounter (Signed)
New Message  Pt stated Dr Dutch QuintPoole sent surgical clearance to Dr Antoine PocheHOchrein- pt following up. Please call back and discuss.

## 2016-01-01 NOTE — Progress Notes (Addendum)
Anesthesia Chart Review:  Pt is an 82 year old female scheduled for C4-5 ACDF on 01/03/2016 with Julio SicksHenry Pool, MD.   Cardiologist is Rollene RotundaJames Hochrein, MD who has cleared pt for surgery. PCP is Tarri Fullerichard Escajeda, MD.   PMH includes:  Aortic stenosis (s/p valve in valve TAVR 09/05/14), CAD (s/p CABG 2009), LBBB, HTN, hyperlipidemia, carotid stenosis, CKD, glaucoma, post-op N/V, GERD. Hard of hearing, blind in R eye.  Never smoker. BMI 30. S/p cervical laminectomy 11/04/13.   Medications include: amlodipine, ASA, lipitor, hctz, timolol.   Preoperative labs reviewed.    EKG 01/01/16: NSR. LAD. LBBB.   Echo 09/21/15:  - Left ventricle: The cavity size was normal. Wall thickness was increased in a pattern of moderate LVH. Systolic function was normal. The estimated ejection fraction was in the range of 60% to 65%. Wall motion was normal; there were no regional wall motion abnormalities. Doppler parameters are consistent with abnormal left ventricular relaxation (grade 1 diastolic  dysfunction). The E/e&' ratio is between 8-15, suggesting elevated LV filling pressure. - Aortic valve: S/p Aortic stent valve. Gradients are higher than typically expected. No paravalvular leak. No obstruction - AT <100 msec, DVI >0.25. - Mitral valve: Calcified annulus. Mildly thickened leaflets. There was trivial regurgitation. - Left atrium: The atrium was normal in size. - Tricuspid valve: There was mild regurgitation. - Pulmonary arteries: PA peak pressure: 34 mm Hg (S). - Inferior vena cava: The vessel was normal in size. The respirophasic diameter changes were in the normal range (>= 50%), consistent with normal central venous pressure.     Carotid duplex 06/22/15:  - R ICA stenosis 40-59% - L ICA stenosis <40%  Cardiac cath 07/03/14:  1. Severe native three-vessel coronary artery disease 2. Status post multivessel CABG with continued patency of the LIMA to LAD, sequential saphenous vein graft to OM1 and OM 2, and  saphenous vein graft to distal RCA 3. Severe bioprosthetic aortic valve stenosis  If no changes, I anticipate pt can proceed with surgery as scheduled.   Rica Mastngela Kabbe, FNP-BC [facility] Phone: [phone] 01/02/2016 12:59 PM

## 2016-01-02 MED ORDER — CEFAZOLIN SODIUM-DEXTROSE 2-4 GM/100ML-% IV SOLN
2.0000 g | INTRAVENOUS | Status: AC
Start: 2016-01-03 — End: 2016-01-03
  Administered 2016-01-03: 2 g via INTRAVENOUS
  Filled 2016-01-02: qty 100

## 2016-01-03 ENCOUNTER — Inpatient Hospital Stay (HOSPITAL_COMMUNITY): Payer: Medicare HMO | Admitting: Anesthesiology

## 2016-01-03 ENCOUNTER — Encounter (HOSPITAL_COMMUNITY)
Admission: RE | Disposition: A | Discharge status: Home / Self Care | Payer: Self-pay | Source: Ambulatory Visit | Attending: Neurosurgery

## 2016-01-03 ENCOUNTER — Encounter (HOSPITAL_COMMUNITY): Payer: Self-pay | Admitting: Surgery

## 2016-01-03 ENCOUNTER — Inpatient Hospital Stay (HOSPITAL_COMMUNITY): Payer: Medicare HMO

## 2016-01-03 ENCOUNTER — Inpatient Hospital Stay (HOSPITAL_COMMUNITY)
Admission: RE | Admit: 2016-01-03 | Discharge: 2016-01-03 | DRG: 473 | Disposition: A | Discharge status: Home / Self Care | Payer: Medicare HMO | Source: Ambulatory Visit | Attending: Neurosurgery | Admitting: Neurosurgery

## 2016-01-03 ENCOUNTER — Inpatient Hospital Stay (HOSPITAL_COMMUNITY): Payer: Medicare HMO | Admitting: Emergency Medicine

## 2016-01-03 DIAGNOSIS — M4802 Spinal stenosis, cervical region: Secondary | ICD-10-CM | POA: Diagnosis present

## 2016-01-03 DIAGNOSIS — I2510 Atherosclerotic heart disease of native coronary artery without angina pectoris: Secondary | ICD-10-CM | POA: Diagnosis present

## 2016-01-03 DIAGNOSIS — Z888 Allergy status to other drugs, medicaments and biological substances status: Secondary | ICD-10-CM | POA: Diagnosis not present

## 2016-01-03 DIAGNOSIS — Z79899 Other long term (current) drug therapy: Secondary | ICD-10-CM

## 2016-01-03 DIAGNOSIS — Z7982 Long term (current) use of aspirin: Secondary | ICD-10-CM

## 2016-01-03 DIAGNOSIS — Z951 Presence of aortocoronary bypass graft: Secondary | ICD-10-CM

## 2016-01-03 DIAGNOSIS — H9190 Unspecified hearing loss, unspecified ear: Secondary | ICD-10-CM | POA: Diagnosis present

## 2016-01-03 DIAGNOSIS — N189 Chronic kidney disease, unspecified: Secondary | ICD-10-CM | POA: Diagnosis present

## 2016-01-03 DIAGNOSIS — I129 Hypertensive chronic kidney disease with stage 1 through stage 4 chronic kidney disease, or unspecified chronic kidney disease: Secondary | ICD-10-CM | POA: Diagnosis present

## 2016-01-03 DIAGNOSIS — E785 Hyperlipidemia, unspecified: Secondary | ICD-10-CM | POA: Diagnosis present

## 2016-01-03 DIAGNOSIS — Z952 Presence of prosthetic heart valve: Secondary | ICD-10-CM | POA: Diagnosis not present

## 2016-01-03 DIAGNOSIS — H5441 Blindness, right eye, normal vision left eye: Secondary | ICD-10-CM | POA: Diagnosis present

## 2016-01-03 DIAGNOSIS — Z419 Encounter for procedure for purposes other than remedying health state, unspecified: Secondary | ICD-10-CM

## 2016-01-03 DIAGNOSIS — Z880 Allergy status to penicillin: Secondary | ICD-10-CM

## 2016-01-03 DIAGNOSIS — H409 Unspecified glaucoma: Secondary | ICD-10-CM | POA: Diagnosis present

## 2016-01-03 DIAGNOSIS — M542 Cervicalgia: Secondary | ICD-10-CM | POA: Diagnosis present

## 2016-01-03 HISTORY — PX: ANTERIOR CERVICAL DECOMP/DISCECTOMY FUSION: SHX1161

## 2016-01-03 SURGERY — ANTERIOR CERVICAL DECOMPRESSION/DISCECTOMY FUSION 1 LEVEL
Anesthesia: General | Site: Neck

## 2016-01-03 MED ORDER — LIDOCAINE HCL (CARDIAC) 20 MG/ML IV SOLN
INTRAVENOUS | Status: DC | PRN
  Administered 2016-01-03: 100 mg via INTRATRACHEAL

## 2016-01-03 MED ORDER — ROCURONIUM BROMIDE 100 MG/10ML IV SOLN
INTRAVENOUS | Status: DC | PRN
  Administered 2016-01-03: 50 mg via INTRAVENOUS

## 2016-01-03 MED ORDER — FENTANYL CITRATE (PF) 100 MCG/2ML IJ SOLN
INTRAMUSCULAR | Status: AC
  Filled 2016-01-03: qty 2

## 2016-01-03 MED ORDER — PROPOFOL 10 MG/ML IV BOLUS
INTRAVENOUS | Status: DC | PRN
  Administered 2016-01-03: 110 mg via INTRAVENOUS

## 2016-01-03 MED ORDER — CHLORHEXIDINE GLUCONATE CLOTH 2 % EX PADS: 6.0000 | MEDICATED_PAD | Freq: Once | CUTANEOUS | Status: DC

## 2016-01-03 MED ORDER — CYCLOBENZAPRINE HCL 10 MG PO TABS: 10.0000 mg | ORAL_TABLET | Freq: Three times a day (TID) | ORAL | Status: DC | PRN

## 2016-01-03 MED ORDER — ONDANSETRON HCL 4 MG/2ML IJ SOLN
4.0000 mg | INTRAMUSCULAR | Status: DC | PRN
  Administered 2016-01-03: 4 mg via INTRAVENOUS
  Filled 2016-01-03: qty 2

## 2016-01-03 MED ORDER — THROMBIN 5000 UNITS EX SOLR
CUTANEOUS | Status: DC | PRN
  Administered 2016-01-03 (×2): 5000 [IU] via TOPICAL

## 2016-01-03 MED ORDER — HYDROCODONE-ACETAMINOPHEN 5-325 MG PO TABS: 1.0000 | ORAL_TABLET | ORAL | Status: DC | PRN

## 2016-01-03 MED ORDER — SODIUM CHLORIDE 0.9% FLUSH: 3.0000 mL | Freq: Two times a day (BID) | INTRAVENOUS | Status: DC

## 2016-01-03 MED ORDER — OXYCODONE-ACETAMINOPHEN 5-325 MG PO TABS: 1.0000 | ORAL_TABLET | ORAL | Status: DC | PRN

## 2016-01-03 MED ORDER — SODIUM CHLORIDE 0.9 % IR SOLN
Status: DC | PRN
  Administered 2016-01-03: 09:00:00

## 2016-01-03 MED ORDER — DEXAMETHASONE SODIUM PHOSPHATE 10 MG/ML IJ SOLN
10.0000 mg | INTRAMUSCULAR | Status: AC
  Administered 2016-01-03: 10 mg via INTRAVENOUS

## 2016-01-03 MED ORDER — MEPERIDINE HCL 25 MG/ML IJ SOLN: 6.2500 mg | INTRAMUSCULAR | Status: DC | PRN

## 2016-01-03 MED ORDER — DEXAMETHASONE SODIUM PHOSPHATE 10 MG/ML IJ SOLN
INTRAMUSCULAR | Status: AC
  Filled 2016-01-03: qty 1

## 2016-01-03 MED ORDER — SODIUM CHLORIDE 0.9% FLUSH: 3.0000 mL | INTRAVENOUS | Status: DC | PRN

## 2016-01-03 MED ORDER — LIDOCAINE 2% (20 MG/ML) 5 ML SYRINGE
INTRAMUSCULAR | Status: AC
  Filled 2016-01-03: qty 5

## 2016-01-03 MED ORDER — METOCLOPRAMIDE HCL 5 MG/ML IJ SOLN: 10.0000 mg | Freq: Once | INTRAMUSCULAR | Status: DC | PRN

## 2016-01-03 MED ORDER — LACTATED RINGERS IV SOLN
INTRAVENOUS | Status: DC | PRN
  Administered 2016-01-03 (×2): via INTRAVENOUS

## 2016-01-03 MED ORDER — PHENYLEPHRINE HCL 10 MG/ML IJ SOLN
10.0000 mg | INTRAVENOUS | Status: DC | PRN
  Administered 2016-01-03: 25 ug/min via INTRAVENOUS

## 2016-01-03 MED ORDER — HEMOSTATIC AGENTS (NO CHARGE) OPTIME
TOPICAL | Status: DC | PRN
  Administered 2016-01-03: 1 via TOPICAL

## 2016-01-03 MED ORDER — ONDANSETRON HCL 4 MG/2ML IJ SOLN
INTRAMUSCULAR | Status: DC | PRN
  Administered 2016-01-03: 4 mg via INTRAVENOUS

## 2016-01-03 MED ORDER — ZOLPIDEM TARTRATE 5 MG PO TABS: 5.0000 mg | ORAL_TABLET | Freq: Every evening | ORAL | Status: DC | PRN

## 2016-01-03 MED ORDER — AMLODIPINE BESYLATE 2.5 MG PO TABS
2.5000 mg | ORAL_TABLET | Freq: Every day | ORAL | Status: DC
  Filled 2016-01-03: qty 1

## 2016-01-03 MED ORDER — ACETAMINOPHEN 325 MG PO TABS: 650.0000 mg | ORAL_TABLET | ORAL | Status: DC | PRN

## 2016-01-03 MED ORDER — ROCURONIUM BROMIDE 50 MG/5ML IV SOLN
INTRAVENOUS | Status: AC
  Filled 2016-01-03: qty 1

## 2016-01-03 MED ORDER — ACETAMINOPHEN 650 MG RE SUPP: 650.0000 mg | RECTAL | Status: DC | PRN

## 2016-01-03 MED ORDER — CEFAZOLIN SODIUM-DEXTROSE 2-4 GM/100ML-% IV SOLN
2.0000 g | Freq: Once | INTRAVENOUS | Status: DC
Start: 2016-01-03 — End: 2016-01-04

## 2016-01-03 MED ORDER — ARTIFICIAL TEARS OP OINT
TOPICAL_OINTMENT | OPHTHALMIC | Status: AC
  Filled 2016-01-03: qty 3.5

## 2016-01-03 MED ORDER — SUGAMMADEX SODIUM 200 MG/2ML IV SOLN: INTRAVENOUS | Status: DC | PRN

## 2016-01-03 MED ORDER — ATORVASTATIN CALCIUM 20 MG PO TABS
80.0000 mg | ORAL_TABLET | Freq: Every day | ORAL | Status: DC
  Filled 2016-01-03: qty 4

## 2016-01-03 MED ORDER — SUGAMMADEX SODIUM 200 MG/2ML IV SOLN
INTRAVENOUS | Status: DC | PRN
  Administered 2016-01-03: 200 mg via INTRAVENOUS

## 2016-01-03 MED ORDER — VITAMIN D (ERGOCALCIFEROL) 1.25 MG (50000 UNIT) PO CAPS
50000.0000 [IU] | ORAL_CAPSULE | ORAL | Status: DC
  Filled 2016-01-03: qty 1

## 2016-01-03 MED ORDER — HYDROCHLOROTHIAZIDE 25 MG PO TABS
25.0000 mg | ORAL_TABLET | Freq: Every day | ORAL | Status: DC
  Filled 2016-01-03: qty 1

## 2016-01-03 MED ORDER — FENTANYL CITRATE (PF) 250 MCG/5ML IJ SOLN
INTRAMUSCULAR | Status: DC | PRN
Start: 2016-01-03 — End: 2016-01-03
  Administered 2016-01-03: 50 ug via INTRAVENOUS
  Administered 2016-01-03 (×2): 100 ug via INTRAVENOUS

## 2016-01-03 MED ORDER — MENTHOL 3 MG MT LOZG: 1.0000 | LOZENGE | OROMUCOSAL | Status: DC | PRN

## 2016-01-03 MED ORDER — SUGAMMADEX SODIUM 200 MG/2ML IV SOLN
INTRAVENOUS | Status: AC
  Filled 2016-01-03: qty 2

## 2016-01-03 MED ORDER — PHENOL 1.4 % MT LIQD: 1.0000 | OROMUCOSAL | Status: DC | PRN

## 2016-01-03 MED ORDER — SODIUM CHLORIDE 0.9 % IV SOLN: 250.0000 mL | INTRAVENOUS | Status: DC

## 2016-01-03 MED ORDER — FENTANYL CITRATE (PF) 250 MCG/5ML IJ SOLN
INTRAMUSCULAR | Status: AC
  Filled 2016-01-03: qty 5

## 2016-01-03 MED ORDER — TIMOLOL MALEATE 0.5 % OP SOLN
1.0000 [drp] | Freq: Two times a day (BID) | OPHTHALMIC | Status: DC
  Filled 2016-01-03: qty 5

## 2016-01-03 MED ORDER — FENTANYL CITRATE (PF) 100 MCG/2ML IJ SOLN
25.0000 ug | INTRAMUSCULAR | Status: DC | PRN
Start: 2016-01-03 — End: 2016-01-03
  Administered 2016-01-03 (×4): 25 ug via INTRAVENOUS

## 2016-01-03 MED ORDER — 0.9 % SODIUM CHLORIDE (POUR BTL) OPTIME
TOPICAL | Status: DC | PRN
  Administered 2016-01-03: 1000 mL

## 2016-01-03 MED ORDER — HYDROMORPHONE HCL 1 MG/ML IJ SOLN: 0.5000 mg | INTRAMUSCULAR | Status: DC | PRN

## 2016-01-03 SURGICAL SUPPLY — 60 items
APL SKNCLS STERI-STRIP NONHPOA (GAUZE/BANDAGES/DRESSINGS) ×1
BAG DECANTER FOR FLEXI CONT (MISCELLANEOUS) ×2 IMPLANT
BENZOIN TINCTURE PRP APPL 2/3 (GAUZE/BANDAGES/DRESSINGS) ×2 IMPLANT
BIT DRILL 13 (BIT) ×1 IMPLANT
BONE CERV LORDOTIC 14.5X12X7 (Bone Implant) ×2 IMPLANT
BRUSH SCRUB EZ PLAIN DRY (MISCELLANEOUS) ×2 IMPLANT
BUR MATCHSTICK NEURO 3.0 LAGG (BURR) ×2 IMPLANT
CANISTER SUCT 3000ML PPV (MISCELLANEOUS) ×2 IMPLANT
DRAPE C-ARM 42X72 X-RAY (DRAPES) ×4 IMPLANT
DRAPE LAPAROTOMY 100X72 PEDS (DRAPES) ×2 IMPLANT
DRAPE LAPAROTOMY T 102X78X121 (DRAPES) IMPLANT
DRAPE MICROSCOPE LEICA (MISCELLANEOUS) ×2 IMPLANT
DRAPE POUCH INSTRU U-SHP 10X18 (DRAPES) ×2 IMPLANT
DURAPREP 6ML APPLICATOR 50/CS (WOUND CARE) ×2 IMPLANT
ELECT COATED BLADE 2.86 ST (ELECTRODE) ×2 IMPLANT
ELECT REM PT RETURN 9FT ADLT (ELECTROSURGICAL) ×2
ELECTRODE REM PT RTRN 9FT ADLT (ELECTROSURGICAL) ×1 IMPLANT
GAUZE SPONGE 4X4 12PLY STRL (GAUZE/BANDAGES/DRESSINGS) ×2 IMPLANT
GAUZE SPONGE 4X4 16PLY XRAY LF (GAUZE/BANDAGES/DRESSINGS) IMPLANT
GLOVE BIOGEL PI IND STRL 7.0 (GLOVE) IMPLANT
GLOVE BIOGEL PI IND STRL 7.5 (GLOVE) IMPLANT
GLOVE BIOGEL PI INDICATOR 7.0 (GLOVE) ×1
GLOVE BIOGEL PI INDICATOR 7.5 (GLOVE) ×2
GLOVE ECLIPSE 7.0 STRL STRAW (GLOVE) ×1 IMPLANT
GLOVE ECLIPSE 9.0 STRL (GLOVE) ×2 IMPLANT
GLOVE EXAM NITRILE LRG STRL (GLOVE) IMPLANT
GLOVE EXAM NITRILE MD LF STRL (GLOVE) IMPLANT
GLOVE EXAM NITRILE XL STR (GLOVE) IMPLANT
GLOVE EXAM NITRILE XS STR PU (GLOVE) IMPLANT
GLOVE SURG SS PI 6.5 STRL IVOR (GLOVE) ×3 IMPLANT
GOWN STRL REUS W/ TWL LRG LVL3 (GOWN DISPOSABLE) IMPLANT
GOWN STRL REUS W/ TWL XL LVL3 (GOWN DISPOSABLE) ×1 IMPLANT
GOWN STRL REUS W/TWL 2XL LVL3 (GOWN DISPOSABLE) IMPLANT
GOWN STRL REUS W/TWL LRG LVL3 (GOWN DISPOSABLE) ×4
GOWN STRL REUS W/TWL XL LVL3 (GOWN DISPOSABLE) ×2
HALTER HD/CHIN CERV TRACTION D (MISCELLANEOUS) ×2 IMPLANT
KIT BASIN OR (CUSTOM PROCEDURE TRAY) ×2 IMPLANT
KIT ROOM TURNOVER OR (KITS) ×2 IMPLANT
NDL SPNL 20GX3.5 QUINCKE YW (NEEDLE) ×1 IMPLANT
NEEDLE SPNL 20GX3.5 QUINCKE YW (NEEDLE) ×2 IMPLANT
NS IRRIG 1000ML POUR BTL (IV SOLUTION) ×2 IMPLANT
PACK LAMINECTOMY NEURO (CUSTOM PROCEDURE TRAY) ×2 IMPLANT
PAD ARMBOARD 7.5X6 YLW CONV (MISCELLANEOUS) ×6 IMPLANT
PLATE ELITE VISION 25MM (Plate) ×1 IMPLANT
RUBBERBAND STERILE (MISCELLANEOUS) ×4 IMPLANT
SCREW ST 13X4.5XST VAR NS (Screw) IMPLANT
SCREW ST 13X4XST VA NS SPNE (Screw) IMPLANT
SCREW ST VAR 4 ATL (Screw) ×4 IMPLANT
SCREW ST VAR 4.5 ATL (Screw) ×4 IMPLANT
SPONGE INTESTINAL PEANUT (DISPOSABLE) ×2 IMPLANT
SPONGE SURGIFOAM ABS GEL SZ50 (HEMOSTASIS) ×2 IMPLANT
STRIP CLOSURE SKIN 1/2X4 (GAUZE/BANDAGES/DRESSINGS) ×2 IMPLANT
SUT VIC AB 3-0 SH 8-18 (SUTURE) ×2 IMPLANT
SUT VIC AB 4-0 RB1 18 (SUTURE) ×4 IMPLANT
TAPE CLOTH 4X10 WHT NS (GAUZE/BANDAGES/DRESSINGS) ×1 IMPLANT
TAPE CLOTH SURG 4X10 WHT LF (GAUZE/BANDAGES/DRESSINGS) ×1 IMPLANT
TOWEL OR 17X24 6PK STRL BLUE (TOWEL DISPOSABLE) ×2 IMPLANT
TOWEL OR 17X26 10 PK STRL BLUE (TOWEL DISPOSABLE) ×2 IMPLANT
TRAP SPECIMEN MUCOUS 40CC (MISCELLANEOUS) ×2 IMPLANT
WATER STERILE IRR 1000ML POUR (IV SOLUTION) ×2 IMPLANT

## 2016-01-03 NOTE — Anesthesia Postprocedure Evaluation (Signed)
Anesthesia Post Note  Patient: [name]  Procedure(s) Performed: Procedure(s) (LRB): Anterior Cervical Decompression Fusion Cervical Four-Five  (N/A)  Patient location during evaluation: PACU Anesthesia Type: General Level of consciousness: awake and alert Pain management: pain level controlled Vital Signs Assessment: post-procedure vital signs reviewed and stable Respiratory status: spontaneous breathing, nonlabored ventilation, respiratory function stable and patient connected to nasal cannula oxygen Cardiovascular status: blood pressure returned to baseline and stable Postop Assessment: no signs of nausea or vomiting Anesthetic complications: no    Last Vitals:  Filed Vitals:   01/03/16 1015 01/03/16 1024  BP:  173/82  Pulse: 65 65  Temp:    Resp: 8     Last Pain:  Filed Vitals:   01/03/16 1041  PainSc: Asleep                 Phillips Groutarignan, Aamirah Salmi

## 2016-01-03 NOTE — Progress Notes (Signed)
Pt doing well. Pt and husband given D/C instructions with Rx's, verbal understanding was provided. Pt's incision is clean and dry with no sign of infection. Pt D/C'd home via wheelchair @ 1900 per MD order. Pt is stable @ D/C and has no other needs at this time. Rema FendtAshley Fusako Tanabe, RN

## 2016-01-03 NOTE — Anesthesia Procedure Notes (Signed)
Procedure Name: Intubation Date/Time: 01/03/2016 8:07 AM Performed by: Marena ChancyBECKNER, Tagen Milby S Pre-anesthesia Checklist: Emergency Drugs available, Patient identified, Suction available, Patient being monitored and Timeout performed Patient Re-evaluated:Patient Re-evaluated prior to inductionOxygen Delivery Method: Circle system utilized Preoxygenation: Pre-oxygenation with 100% oxygen Intubation Type: IV induction Ventilation: Mask ventilation without difficulty and Oral airway inserted - appropriate to patient size Laryngoscope Size: Hyacinth MeekerMiller and 2 Grade View: Grade I Tube type: Oral Tube size: 7.0 mm Number of attempts: 1 Placement Confirmation: ETT inserted through vocal cords under direct vision,  positive ETCO2 and breath sounds checked- equal and bilateral Tube secured with: Tape Dental Injury: Teeth and Oropharynx as per pre-operative assessment

## 2016-01-03 NOTE — Anesthesia Preprocedure Evaluation (Addendum)
Anesthesia Evaluation  Patient identified by MRN, date of birth, ID band Patient awake    Reviewed: Allergy & Precautions, NPO status , Patient's Chart, lab work & pertinent test results  History of Anesthesia Complications (+) PONV  Airway Mallampati: II  TM Distance: >3 FB Neck ROM: Full    Dental no notable dental hx. (+) Edentulous Upper, Edentulous Lower   Pulmonary neg pulmonary ROS,    Pulmonary exam normal breath sounds clear to auscultation       Cardiovascular hypertension, Pt. on medications + CAD, + CABG and + Peripheral Vascular Disease ( Carotid U/S 5/13: bilat 40-59%)  Normal cardiovascular exam+ Valvular Problems/Murmurs (s/p AVRwith CABG 2009 then TAVR 2016 ) AS  Rhythm:Regular Rate:Normal  LBBB   Neuro/Psych negative neurological ROS  negative psych ROS   GI/Hepatic negative GI ROS, Neg liver ROS,   Endo/Other  negative endocrine ROS  Renal/GU Renal InsufficiencyRenal disease  negative genitourinary   Musculoskeletal negative musculoskeletal ROS (+)   Abdominal   Peds negative pediatric ROS (+)  Hematology negative hematology ROS (+)   Anesthesia Other Findings   Reproductive/Obstetrics negative OB ROS                          Anesthesia Physical Anesthesia Plan  ASA: III  Anesthesia Plan: General   Post-op Pain Management:    Induction: Intravenous  Airway Management Planned: Oral ETT  Additional Equipment:   Intra-op Plan:   Post-operative Plan: Extubation in OR  Informed Consent: I have reviewed the patients History and Physical, chart, labs and discussed the procedure including the risks, benefits and alternatives for the proposed anesthesia with the patient or authorized representative who has indicated his/her understanding and acceptance.   Dental advisory given  Plan Discussed with: CRNA  Anesthesia Plan Comments:        Anesthesia Quick  Evaluation

## 2016-01-03 NOTE — Brief Op Note (Signed)
01/03/2016  9:23 AM  PATIENT:  [name]  82 y.o. female  PRE-OPERATIVE DIAGNOSIS:  Spondylosis  POST-OPERATIVE DIAGNOSIS:  Spondylosis  PROCEDURE:  Procedure(s): Anterior Cervical Decompression Fusion Cervical Four-Five  (N/A)  SURGEON:  Surgeon(s) and Role:    * Julio SicksHenry Tee Richeson, MD - Primary  PHYSICIAN ASSISTANT:   ASSISTANTS: Nundkumar   ANESTHESIA:   general  EBL:  Total I/O In: 1000 [I.V.:1000] Out: -   BLOOD ADMINISTERED:none  DRAINS: none   LOCAL MEDICATIONS USED:  NONE  SPECIMEN:  No Specimen  DISPOSITION OF SPECIMEN:  N/A  COUNTS:  YES  TOURNIQUET:  * No tourniquets in log *  DICTATION: .Dragon Dictation  PLAN OF CARE: Admit to inpatient   PATIENT DISPOSITION:  PACU - hemodynamically stable.   Delay start of Pharmacological VTE agent (>24hrs) due to surgical blood loss or risk of bleeding: yes

## 2016-01-03 NOTE — Discharge Instructions (Signed)

## 2016-01-03 NOTE — H&P (Signed)
[name] is an 82 y.o. female.   Chief Complaint: Neck pain HPI: 82 year old female with progressively worsening right-sided neck pain with some radiation into her right shoulder. Patient with some intermittent numbness and weakness. Patient status post previous anterior cervical discectomy and fusion at C5-6. Patient also status post previous posterior decompression on the left at C4-5. Patient discovered to have progressive anterior listhesis of C4 on C5 with worsening neural foraminal stenosis right greater than left. Patient presents now for C4-5 anterior cervical discectomy and fusion in hopes of improving her symptoms.  Past Medical History  Diagnosis Date  . Aortic stenosis     a.  s/p tissue AVR at time of CABG in 2009;  b. Echo 04/2012: EF 55-60%, moderate AS (mean 34);  c. Echo 6/14: Mild LVH, mild focal basal septal hypertrophy, EF 55-60%, normal wall motion, grade 2 diastolic dysfunction, AVR with moderate aortic stenosis (mean 36) - consider TEE if clinically indicated, mild AI, mild MR, PASP 44, ascending aorta mild to moderately dilated (consider CTA or MRA)  . Dyslipidemia   . Hypertension   . Spinal arthritis   . CAD (coronary artery disease)     a. s/p CABG; b. Myoview 06/2011: No ischemia, EF 67%;  c. 01/2013 Cath: LM min irregs, LAD small, LCX 182mOMs ok, RCA known 100, VG->RCA ok, VG->OM1->2 ok, LIMA->LAD ok.  .Marland KitchenHx of CABG     LIMA-LAD, SVG-RCA, SVG-OM1/OM2 in 2009  . Blindness of right eye     due to retinal bleed  . Glaucoma   . Left bundle branch block   . GERD (gastroesophageal reflux disease)   . Arthritis   . HOH (hard of hearing)   . PONV (postoperative nausea and vomiting)     nausea  yrs ago  . Carotid stenosis     Carotid U/S 5/13:  bilat 40-59%  . History of hiatal hernia   . Ovarian cyst     Not clearly malignant but removed  . S/P TAVR (transcatheter aortic valve replacement) 09/05/2014    20 mm Edwards Sapien 3 transcatheter heart valve placed  via open right transfemoral approach for valve-in-valve replacement for prosthetic valve dysfunction  . Chronic kidney disease     renal insufficiency-    Past Surgical History  Procedure Laterality Date  . Coronary artery bypass graft  Jan 2009    LIMA to LAD, SVG to RCA, SVG to OM 1 & 2  . Aortic valve replacement  Jan 2009    #21 mm pericardial prosthesis  . Back surgery    . Neck surgery      cervical  . Abdominal hysterectomy    . Cholecystectomy    . Cardiac catheterization  2014  . Appendectomy    . Posterior cervical laminectomy Left 11/04/2013    Procedure: Left Cervical Four-Five Foraminotomy ;  Surgeon: HCharlie Pitter MD;  Location: [facility];  Service: Neurosurgery;  Laterality: Left;  Left Cervical Four-Five Foraminotomy   . Left and right heart catheterization with coronary/graft angiogram N/A 01/13/2013    Procedure: LEFT AND RIGHT HEART CATHETERIZATION WITH CBeatrix Fetters  Surgeon: JMinus Breeding MD;  [facility];  Service: Cardiovascular;  Laterality: N/A;  . Left heart catheterization with coronary/graft angiogram N/A 07/03/2014    Procedure: LEFT HEART CATHETERIZATION WITH CBeatrix Fetters  Surgeon: MBlane Ohara MD;  [facility];  Service: Cardiovascular;  Laterality: N/A;  . Tee without cardioversion N/A 08/10/2014  Procedure: TRANSESOPHAGEAL ECHOCARDIOGRAM (TEE);  Surgeon: Dorothy Spark, MD;  Location: [facility];  Service: Cardiovascular;  Laterality: N/A;  . Transcatheter aortic valve replacement, transfemoral N/A 09/05/2014    Procedure: TRANSCATHETER AORTIC VALVE REPLACEMENT, TRANSFEMORAL;  Surgeon: Blane Ohara, MD;  Location: [place];  Service: Open Heart Surgery;  Laterality: N/A;  . Tee without cardioversion N/A 09/05/2014    Procedure: TRANSESOPHAGEAL ECHOCARDIOGRAM (TEE);  Surgeon: Blane Ohara, MD;  Location: [place];  Service: Open Heart Surgery;  Laterality: N/A;  . Eye surgery Right     retinal  detachment blind /yrs ago cataracts    Family History  Problem Relation Age of Onset  . Cancer Mother   . Heart attack Father   . Heart attack Brother    Social History:  reports that she has never smoked. She has never used smokeless tobacco. She reports that she does not drink alcohol or use illicit drugs.  Allergies:  Allergies  Allergen Reactions  . Amoxicillin Other (See Comments)    Unknown; Patient tolerated Ancef in May and Zinacef in March 2017.  Marland Kitchen Zetia [Ezetimibe] Other (See Comments)    DIZZINESS    Medications Prior to Admission  Medication Sig Dispense Refill  . amLODipine (NORVASC) 2.5 MG tablet Take 2.5 mg by mouth daily.     Marland Kitchen aspirin 81 MG tablet Take 81 mg by mouth daily.    Marland Kitchen atorvastatin (LIPITOR) 80 MG tablet Take 1 tablet (80 mg total) by mouth daily. 90 tablet 0  . Calcium Carbonate (CALTRATE 600 PO) Take 600 mg by mouth daily.    . hydrochlorothiazide (HYDRODIURIL) 25 MG tablet Take 1 tablet (25 mg total) by mouth daily. 90 tablet 1  . ibuprofen (ADVIL,MOTRIN) 200 MG tablet Take 600 mg by mouth every 6 (six) hours as needed for headache or moderate pain.    Marland Kitchen timolol (TIMOPTIC) 0.5 % ophthalmic solution Place 1 drop into both eyes 2 (two) times daily.    . Vitamin D, Ergocalciferol, (DRISDOL) 50000 UNITS CAPS Take 50,000 Units by mouth every 7 (seven) days. On Tuesday    . zolpidem (AMBIEN) 5 MG tablet Take 5 mg by mouth at bedtime as needed for sleep.       Results for orders placed or performed during the hospital encounter of 01/01/16 (from the past 48 hour(s))  Surgical pcr screen     Status: None   Collection Time: 01/01/16  8:39 AM  Result Value Ref Range   MRSA, PCR NEGATIVE NEGATIVE   Staphylococcus aureus NEGATIVE NEGATIVE    Comment:        The Xpert SA Assay (FDA approved for NASAL specimens in patients over 43 years of age), is one component of a comprehensive surveillance program.  Test performance has been validated by [facility]  for patients greater than or equal to 34 year old. It is not intended to diagnose infection nor to guide or monitor treatment.   Basic metabolic panel     Status: Abnormal   Collection Time: 01/01/16  8:40 AM  Result Value Ref Range   Sodium 137 135 - 145 mmol/L   Potassium 3.7 3.5 - 5.1 mmol/L   Chloride 105 101 - 111 mmol/L   CO2 23 22 - 32 mmol/L   Glucose, Bld 107 (H) 65 - 99 mg/dL   BUN 23 (H) 6 - 20 mg/dL   Creatinine, Ser 1.49 (H) 0.44 - 1.00 mg/dL   Calcium 10.1 8.9 - 10.3 mg/dL  GFR calc non Af Amer 32 (L) >60 mL/min   GFR calc Af Amer 37 (L) >60 mL/min    Comment: (NOTE) The eGFR has been calculated using the CKD EPI equation. This calculation has not been validated in all clinical situations. eGFR's persistently <60 mL/min signify possible Chronic Kidney Disease.    Anion gap 9 5 - 15  CBC WITH DIFFERENTIAL     Status: Abnormal   Collection Time: 01/01/16  8:40 AM  Result Value Ref Range   WBC 8.7 4.0 - 10.5 K/uL   RBC 4.53 3.87 - 5.11 MIL/uL   Hemoglobin 11.9 (L) 12.0 - 15.0 g/dL   HCT 37.4 36.0 - 46.0 %   MCV 82.6 78.0 - 100.0 fL   MCH 26.3 26.0 - 34.0 pg   MCHC 31.8 30.0 - 36.0 g/dL   RDW 15.0 11.5 - 15.5 %   Platelets 220 150 - 400 K/uL   Neutrophils Relative % 65 %   Neutro Abs 5.7 1.7 - 7.7 K/uL   Lymphocytes Relative 26 %   Lymphs Abs 2.3 0.7 - 4.0 K/uL   Monocytes Relative 6 %   Monocytes Absolute 0.5 0.1 - 1.0 K/uL   Eosinophils Relative 2 %   Eosinophils Absolute 0.2 0.0 - 0.7 K/uL   Basophils Relative 1 %   Basophils Absolute 0.1 0.0 - 0.1 K/uL   No results found.  Pertinent items noted in HPI and remainder of comprehensive ROS otherwise negative.  Blood pressure 178/81, pulse 69, temperature 97.7 F (36.5 C), temperature source Oral, resp. rate 20, height 5' 1" (1.549 m), weight 71.073 kg (156 lb 11 oz), SpO2 99 %.  Patient is awake and alert. She is oriented and appropriate. Her speech is fluent. Her judgment and insight are intact. Her  cranial nerve function is normal except she is blind in her right eye. Motor and sensory examination extremities normal. Wound clean and dry. Chest and abdomen benign. Extremities free from injury deformity. Assessment/Plan C4-5 anterior listhesis with stenosis. Plan C4-5 anterior cervical discectomy and fusion with interbody allograft and anterior plate instrumentation. Risks and benefits of been explained. Patient wishes to proceed.  POOL,HENRY A 01/03/2016, 7:48 AM

## 2016-01-03 NOTE — Transfer of Care (Signed)
Immediate Anesthesia Transfer of Care Note  Patient: [name]  Procedure(s) Performed: Procedure(s): Anterior Cervical Decompression Fusion Cervical Four-Five  (N/A)  Patient Location: PACU  Anesthesia Type:General  Level of Consciousness: awake, alert  and oriented  Airway & Oxygen Therapy: Patient Spontanous Breathing and Patient connected to nasal cannula oxygen  Post-op Assessment: Report given to RN, Post -op Vital signs reviewed and stable and Patient moving all extremities X 4  Post vital signs: Reviewed and stable  Last Vitals:  Filed Vitals:   01/03/16 0648  BP: 178/81  Pulse: 69  Temp: 36.5 C  Resp: 20    Last Pain:  Filed Vitals:   01/03/16 0650  PainSc: 4       Patients Stated Pain Goal: 8 (01/03/16 0647)  Complications: No apparent anesthesia complications

## 2016-01-03 NOTE — Op Note (Signed)
Date of procedure: 01/03/2016  Date of dictation: Same  Service: Neurosurgery  Preoperative diagnosis: C4-5 anterior listhesis with stenosis status post C5-6 anterior cervical fusion with instrumentation  Postoperative diagnosis: Same  Procedure Name: C4-5 anterior cervical discectomy with interbody fusion utilizing interbody allograft wedge and anterior plate instrumentation. Removal of C5-6 anterior cervical plate  Surgeon:Joyice Magda A.Eretria Manternach, M.D.  Asst. Surgeon: Conchita ParisNundkumar  Anesthesia: General  Indication: 82 year old female status post previous C5-6 anterior cervical fusion many years ago presents with worsening neck pain and right shoulder pain consistent with a C5 radiculopathy. Workup demonstrates evidence of progressive anterior listhesis of C4 on C5 with marked foraminal stenosis. Patient has failed conservative management and presents now for anterior cervical discectomy and fusion at C4-5.  Operative note: After induction of anesthesia, patient positioned supine with neck slightly extended and held place of halter traction. Patient's anterior cervical region prepped and draped sterilely. Incision made overlying C4-5. Dissection proceeds on the right side. Retractor placed. Previous plate at J1-9C5-6 was dissected free, disassembled, and removed. Fusion at C5-6 was inspected and found to be solid. Disc space at C4-5 in size. Discectomy performed with various instruments down to level posterior annulus. Microscope brought brought into field use at the remainder of the discectomy. Remaining aspects of annulus and osteophytes removed using high-speed drill down to the level of the posterior wall she'll ligament. Posterior longitudinal ligament was then elevated and resected in piecemeal fashion. Underlying thecal sac was then identified. Wide central decompression then performed by undercutting the bodies of C4 and C5. Decompression then proceeded into each neural foramen. Wide anterior  foraminotomies were performed on course exiting C5 nerve roots bilaterally. At this point a very thorough decompression had been achieved. There was no evidence of injury to the thecal sac or nerve roots. Wound is then irrigated with and bike solution wedge was then measured and impacted into place. This is recessed slightly from the anterior cortical margin. A 25 mm Atlantis anterior cervical plate was then placed over the C3-4 and C5 levels. This is an attachment or fluoroscopic guidance using 13 mm variable-angle screws 2 each at both levels per all 4 screws given a final tightening found to be solidly within the bone. Locking screws engaged both levels. Final images revealed good position bone graft and hardware at proper upper level with normal alignment of the spine. Wounds and irrigated with and like solution. Gelfoam was placed for hemostasis and removed. Wounds and close in layers with Vicryl sutures. Steri-Strips sterile dressing were applied. No apparent complications. Patient tolerated the procedure well and she returns to the recovery room postop.

## 2016-01-03 NOTE — Discharge Summary (Signed)
Physician Discharge Summary  Patient ID: [name] MRN: [id number] DOB/AGE: 80/11/1932 80 y.o.  Admit date: 01/03/2016 Discharge date: 01/03/2016  Admission Diagnoses:  Discharge Diagnoses:  Active Problems:   Foraminal stenosis of cervical region   Discharged Condition: good  Hospital Course: Patient admitted to the hospital where she went underwent an uncomplicated C4-5 anterior cervical discectomy and fusion. Postoperatively she is doing very well. Preoperative neck and upper extremity pain improved. Ambulating well. Ready for discharge home.  Consults:   Significant Diagnostic Studies:   Treatments:   Discharge Exam: Blood pressure 137/59, pulse 77, temperature 97.7 F (36.5 C), temperature source Oral, resp. rate 16, height 5\' 1"  (1.549 m), weight 71.073 kg (156 lb 11 oz), SpO2 98 %. Awake and alert. Oriented and appropriate. Cranial nerve function intact. Motor sensory function extremities normal. Wound clean and dry. Chest and abdomen benign.  Disposition: 01-Home or Self Care     Medication List    TAKE these medications        amLODipine 2.5 MG tablet  Commonly known as:  NORVASC  Take 2.5 mg by mouth daily.     aspirin 81 MG tablet  Take 81 mg by mouth daily.     atorvastatin 80 MG tablet  Commonly known as:  LIPITOR  Take 1 tablet (80 mg total) by mouth daily.     CALTRATE 600 PO  Take 600 mg by mouth daily.     cyclobenzaprine 10 MG tablet  Commonly known as:  FLEXERIL  Take 1 tablet (10 mg total) by mouth 3 (three) times daily as needed for muscle spasms.     hydrochlorothiazide 25 MG tablet  Commonly known as:  HYDRODIURIL  Take 1 tablet (25 mg total) by mouth daily.     HYDROcodone-acetaminophen 5-325 MG tablet  Commonly known as:  NORCO/VICODIN  Take 1-2 tablets by mouth every 4 (four) hours as needed (mild pain).     ibuprofen 200 MG tablet  Commonly known as:  ADVIL,MOTRIN  Take 600 mg by mouth every 6 (six) hours as needed for  headache or moderate pain.     timolol 0.5 % ophthalmic solution  Commonly known as:  TIMOPTIC  Place 1 drop into both eyes 2 (two) times daily.     Vitamin D (Ergocalciferol) 50000 units Caps capsule  Commonly known as:  DRISDOL  Take 50,000 Units by mouth every 7 (seven) days. On Tuesday     zolpidem 5 MG tablet  Commonly known as:  AMBIEN  Take 5 mg by mouth at bedtime as needed for sleep.           Follow-up Information    Follow up with Temple PaciniPOOL,Gigi Onstad A, MD.   Specialty:  Neurosurgery   Contact information:   [address] [place] [address] [phone]       Signed: Temple PaciniOOL,Emorie Mcfate A 01/03/2016, 5:59 PM

## 2016-01-04 ENCOUNTER — Encounter (HOSPITAL_COMMUNITY): Payer: Self-pay | Admitting: Neurosurgery

## 2016-03-05 DIAGNOSIS — M4310 Spondylolisthesis, site unspecified: Secondary | ICD-10-CM | POA: Insufficient documentation

## 2016-03-21 ENCOUNTER — Ambulatory Visit (INDEPENDENT_AMBULATORY_CARE_PROVIDER_SITE_OTHER): Payer: Medicare HMO | Admitting: Physician Assistant

## 2016-03-21 ENCOUNTER — Inpatient Hospital Stay (HOSPITAL_COMMUNITY)
Admission: AD | Admit: 2016-03-21 | Discharge: 2016-03-26 | DRG: 286 | Disposition: A | Discharge status: Home / Self Care | Payer: Medicare HMO | Source: Ambulatory Visit | Attending: Cardiovascular Disease | Admitting: Cardiovascular Disease

## 2016-03-21 ENCOUNTER — Telehealth: Payer: Self-pay | Admitting: Cardiology

## 2016-03-21 ENCOUNTER — Encounter: Payer: Self-pay | Admitting: Physician Assistant

## 2016-03-21 ENCOUNTER — Inpatient Hospital Stay (HOSPITAL_COMMUNITY): Payer: Medicare HMO

## 2016-03-21 VITALS — BP 130/84 | HR 68 | Ht 60.0 in | Wt 155.0 lb

## 2016-03-21 DIAGNOSIS — R079 Chest pain, unspecified: Secondary | ICD-10-CM

## 2016-03-21 DIAGNOSIS — I10 Essential (primary) hypertension: Secondary | ICD-10-CM

## 2016-03-21 DIAGNOSIS — Z953 Presence of xenogenic heart valve: Secondary | ICD-10-CM

## 2016-03-21 DIAGNOSIS — Z952 Presence of prosthetic heart valve: Secondary | ICD-10-CM

## 2016-03-21 DIAGNOSIS — H540 Blindness, both eyes: Secondary | ICD-10-CM | POA: Diagnosis present

## 2016-03-21 DIAGNOSIS — I2510 Atherosclerotic heart disease of native coronary artery without angina pectoris: Secondary | ICD-10-CM | POA: Diagnosis present

## 2016-03-21 DIAGNOSIS — I25110 Atherosclerotic heart disease of native coronary artery with unstable angina pectoris: Principal | ICD-10-CM | POA: Diagnosis present

## 2016-03-21 DIAGNOSIS — Z951 Presence of aortocoronary bypass graft: Secondary | ICD-10-CM

## 2016-03-21 DIAGNOSIS — H409 Unspecified glaucoma: Secondary | ICD-10-CM | POA: Diagnosis present

## 2016-03-21 DIAGNOSIS — Z954 Presence of other heart-valve replacement: Secondary | ICD-10-CM

## 2016-03-21 DIAGNOSIS — I200 Unstable angina: Secondary | ICD-10-CM | POA: Diagnosis not present

## 2016-03-21 DIAGNOSIS — N183 Chronic kidney disease, stage 3 unspecified: Secondary | ICD-10-CM | POA: Insufficient documentation

## 2016-03-21 DIAGNOSIS — E785 Hyperlipidemia, unspecified: Secondary | ICD-10-CM

## 2016-03-21 DIAGNOSIS — M1990 Unspecified osteoarthritis, unspecified site: Secondary | ICD-10-CM | POA: Diagnosis present

## 2016-03-21 DIAGNOSIS — Z79899 Other long term (current) drug therapy: Secondary | ICD-10-CM

## 2016-03-21 DIAGNOSIS — Z7982 Long term (current) use of aspirin: Secondary | ICD-10-CM

## 2016-03-21 DIAGNOSIS — H9190 Unspecified hearing loss, unspecified ear: Secondary | ICD-10-CM | POA: Diagnosis present

## 2016-03-21 DIAGNOSIS — K254 Chronic or unspecified gastric ulcer with hemorrhage: Secondary | ICD-10-CM | POA: Diagnosis present

## 2016-03-21 DIAGNOSIS — Z981 Arthrodesis status: Secondary | ICD-10-CM

## 2016-03-21 DIAGNOSIS — I359 Nonrheumatic aortic valve disorder, unspecified: Secondary | ICD-10-CM

## 2016-03-21 DIAGNOSIS — I129 Hypertensive chronic kidney disease with stage 1 through stage 4 chronic kidney disease, or unspecified chronic kidney disease: Secondary | ICD-10-CM | POA: Diagnosis present

## 2016-03-21 DIAGNOSIS — K219 Gastro-esophageal reflux disease without esophagitis: Secondary | ICD-10-CM | POA: Diagnosis present

## 2016-03-21 DIAGNOSIS — I779 Disorder of arteries and arterioles, unspecified: Secondary | ICD-10-CM

## 2016-03-21 DIAGNOSIS — D500 Iron deficiency anemia secondary to blood loss (chronic): Secondary | ICD-10-CM | POA: Diagnosis present

## 2016-03-21 DIAGNOSIS — I739 Peripheral vascular disease, unspecified: Secondary | ICD-10-CM

## 2016-03-21 LAB — COMPREHENSIVE METABOLIC PANEL
ALK PHOS: 64 U/L (ref 38–126)
ALT: 21 U/L (ref 14–54)
ANION GAP: 11 (ref 5–15)
AST: 25 U/L (ref 15–41)
Albumin: 4.2 g/dL (ref 3.5–5.0)
BUN: 25 mg/dL — ABNORMAL HIGH (ref 6–20)
CALCIUM: 10.3 mg/dL (ref 8.9–10.3)
CHLORIDE: 103 mmol/L (ref 101–111)
CO2: 24 mmol/L (ref 22–32)
Creatinine, Ser: 1.33 mg/dL — ABNORMAL HIGH (ref 0.44–1.00)
GFR calc Af Amer: 42 mL/min — ABNORMAL LOW (ref >60)
GFR calc non Af Amer: 36 mL/min — ABNORMAL LOW (ref >60)
GLUCOSE: 159 mg/dL — AB (ref 65–99)
POTASSIUM: 3.5 mmol/L (ref 3.5–5.1)
SODIUM: 138 mmol/L (ref 135–145)
Total Bilirubin: 1 mg/dL (ref 0.3–1.2)
Total Protein: 7.1 g/dL (ref 6.5–8.1)

## 2016-03-21 LAB — CBC WITH DIFFERENTIAL/PLATELET
BASOS ABS: 0 10*3/uL (ref 0.0–0.1)
Basophils Relative: 0 %
Eosinophils Absolute: 0 10*3/uL (ref 0.0–0.7)
Eosinophils Relative: 0 %
HEMATOCRIT: 40.9 % (ref 36.0–46.0)
HEMOGLOBIN: 12.7 g/dL (ref 12.0–15.0)
LYMPHS PCT: 15 %
Lymphs Abs: 2.1 10*3/uL (ref 0.7–4.0)
MCH: 25.8 pg — ABNORMAL LOW (ref 26.0–34.0)
MCHC: 31.1 g/dL (ref 30.0–36.0)
MCV: 83.1 fL (ref 78.0–100.0)
Monocytes Absolute: 0.6 10*3/uL (ref 0.1–1.0)
Monocytes Relative: 4 %
NEUTROS ABS: 11.3 10*3/uL — AB (ref 1.7–7.7)
NEUTROS PCT: 81 %
Platelets: 242 10*3/uL (ref 150–400)
RBC: 4.92 MIL/uL (ref 3.87–5.11)
RDW: 14.7 % (ref 11.5–15.5)
WBC: 14 10*3/uL — AB (ref 4.0–10.5)

## 2016-03-21 LAB — APTT: APTT: 26 s (ref 24–36)

## 2016-03-21 LAB — TROPONIN I
Troponin I: <0.03 ng/mL (ref <0.03)
Troponin I: 0.03 ng/mL (ref <0.03)

## 2016-03-21 LAB — BRAIN NATRIURETIC PEPTIDE: B NATRIURETIC PEPTIDE 5: 177.9 pg/mL — AB (ref 0.0–100.0)

## 2016-03-21 LAB — PROTIME-INR
INR: 0.96
Prothrombin Time: 12.7 seconds (ref 11.4–15.2)

## 2016-03-21 LAB — HEPARIN LEVEL (UNFRACTIONATED): HEPARIN UNFRACTIONATED: 0.83 [IU]/mL — AB (ref 0.30–0.70)

## 2016-03-21 MED ORDER — NITROGLYCERIN 0.4 MG SL SUBL
0.4000 mg | SUBLINGUAL_TABLET | SUBLINGUAL | Status: DC | PRN
  Administered 2016-03-24 (×2): 0.4 mg via SUBLINGUAL
  Filled 2016-03-21: qty 1

## 2016-03-21 MED ORDER — AMLODIPINE BESYLATE 2.5 MG PO TABS
2.5000 mg | ORAL_TABLET | Freq: Every day | ORAL | Status: DC
  Administered 2016-03-22 – 2016-03-26 (×5): 2.5 mg via ORAL
  Filled 2016-03-21 (×5): qty 1

## 2016-03-21 MED ORDER — SODIUM CHLORIDE 0.9% FLUSH
3.0000 mL | INTRAVENOUS | Status: DC | PRN
Start: 2016-03-21 — End: 2016-03-26

## 2016-03-21 MED ORDER — ZOLPIDEM TARTRATE 5 MG PO TABS
5.0000 mg | ORAL_TABLET | Freq: Every evening | ORAL | Status: DC | PRN
  Administered 2016-03-21 – 2016-03-25 (×5): 5 mg via ORAL
  Filled 2016-03-21 (×5): qty 1

## 2016-03-21 MED ORDER — CYCLOBENZAPRINE HCL 10 MG PO TABS: 10.0000 mg | ORAL_TABLET | Freq: Three times a day (TID) | ORAL | Status: DC | PRN

## 2016-03-21 MED ORDER — ASPIRIN EC 81 MG PO TBEC
81.0000 mg | DELAYED_RELEASE_TABLET | Freq: Every day | ORAL | Status: DC
  Administered 2016-03-22 – 2016-03-26 (×5): 81 mg via ORAL
  Filled 2016-03-21 (×5): qty 1

## 2016-03-21 MED ORDER — HEPARIN BOLUS VIA INFUSION
4000.0000 [IU] | Freq: Once | INTRAVENOUS | Status: AC
Start: 2016-03-21 — End: 2016-03-21
  Administered 2016-03-21: 4000 [IU] via INTRAVENOUS
  Filled 2016-03-21: qty 4000

## 2016-03-21 MED ORDER — TIMOLOL MALEATE 0.5 % OP SOLN
1.0000 [drp] | Freq: Two times a day (BID) | OPHTHALMIC | Status: DC
  Administered 2016-03-21 – 2016-03-26 (×9): 1 [drp] via OPHTHALMIC
  Filled 2016-03-21: qty 5

## 2016-03-21 MED ORDER — ACETAMINOPHEN 325 MG PO TABS
650.0000 mg | ORAL_TABLET | ORAL | Status: DC | PRN
  Administered 2016-03-23 – 2016-03-24 (×2): 650 mg via ORAL
  Filled 2016-03-21 (×2): qty 2

## 2016-03-21 MED ORDER — SODIUM CHLORIDE 0.9% FLUSH
3.0000 mL | Freq: Two times a day (BID) | INTRAVENOUS | Status: DC
  Administered 2016-03-21 – 2016-03-26 (×4): 3 mL via INTRAVENOUS

## 2016-03-21 MED ORDER — ISOSORBIDE MONONITRATE ER 30 MG PO TB24
30.0000 mg | ORAL_TABLET | Freq: Every day | ORAL | Status: DC
  Administered 2016-03-21 – 2016-03-26 (×6): 30 mg via ORAL
  Filled 2016-03-21 (×6): qty 1

## 2016-03-21 MED ORDER — ONDANSETRON HCL 4 MG/2ML IJ SOLN: 4.0000 mg | Freq: Four times a day (QID) | INTRAMUSCULAR | Status: DC | PRN

## 2016-03-21 MED ORDER — HEPARIN (PORCINE) IN NACL 100-0.45 UNIT/ML-% IJ SOLN
700.0000 [IU]/h | INTRAMUSCULAR | Status: DC
  Administered 2016-03-21: 750 [IU]/h via INTRAVENOUS
  Administered 2016-03-22: 700 [IU]/h via INTRAVENOUS
  Filled 2016-03-21 (×3): qty 250

## 2016-03-21 MED ORDER — HYDROCODONE-ACETAMINOPHEN 5-325 MG PO TABS
1.0000 | ORAL_TABLET | ORAL | Status: DC | PRN
  Administered 2016-03-22: 1 via ORAL
  Filled 2016-03-21: qty 1

## 2016-03-21 MED ORDER — SODIUM CHLORIDE 0.9 % IV SOLN: 250.0000 mL | INTRAVENOUS | Status: DC | PRN

## 2016-03-21 MED ORDER — ATORVASTATIN CALCIUM 80 MG PO TABS
80.0000 mg | ORAL_TABLET | Freq: Every day | ORAL | Status: DC
  Administered 2016-03-22 – 2016-03-26 (×5): 80 mg via ORAL
  Filled 2016-03-21 (×5): qty 1

## 2016-03-21 MED ORDER — ASPIRIN 81 MG PO TABS: 81.0000 mg | ORAL_TABLET | Freq: Every day | ORAL | Status: DC

## 2016-03-21 NOTE — Progress Notes (Signed)
ANTICOAGULATION CONSULT NOTE - Initial Consult  Pharmacy Consult for heparin Indication: chest pain/ACS   Assessment: 82 y.o. female with a hx of bioprosthetic AVR in 2009 and subsequent valve in valve TAVR for severe prosthetic valve disease in 3/16, CAD s/p CABG in 2009, HTN HL, CKD.  Patient was worked into cardiology office today for chest pain. She has extensive cardiac history and given her symptoms [facility]. Patient is not on anticoagulation prior to admission. CBC was normal on 6/17  Goal of Therapy:  Heparin level 0.3-0.7 units/ml Monitor platelets by anticoagulation protocol: Yes   Plan:  Give 4000 units bolus x 1 Start heparin infusion at 750 units/hr Check anti-Xa level in 8 hours and daily while on heparin Continue to monitor H&H and platelets   Allergies  Allergen Reactions  . Amoxicillin Other (See Comments)    Unknown; Patient tolerated Ancef in May and Zinacef in March 2017.  Marland Kitchen. Zetia [Ezetimibe] Other (See Comments)    DIZZINESS    Patient Measurements:   Heparin Dosing Weight: 60kg  Vital Signs: Temp: 98.4 F (36.9 C) (09/15 1352) Temp Source: Oral (09/15 1352) BP: 177/82 (09/15 1352) Pulse Rate: 73 (09/15 1352)  Labs: No results for input(s): HGB, HCT, PLT, APTT, LABPROT, INR, HEPARINUNFRC, HEPRLOWMOCWT, CREATININE, CKTOTAL, CKMB, TROPONINI in the last 72 hours.  CrCl cannot be calculated (Patient's most recent lab result is older than the maximum 21 days allowed.).   Medical History: Past Medical History:  Diagnosis Date  . Aortic stenosis    a.  s/p tissue AVR at time of CABG in 2009;  b. Echo 04/2012: EF 55-60%, moderate AS (mean 34);  c. Echo 6/14: Mild LVH, mild focal basal septal hypertrophy, EF 55-60%, normal wall motion, grade 2 diastolic dysfunction, AVR with moderate aortic stenosis (mean 36) - consider TEE if clinically indicated, mild AI, mild MR, PASP 44, ascending aorta  mild to moderately dilated (consider CTA or MRA)  . Arthritis   . Blindness of right eye    due to retinal bleed  . CAD (coronary artery disease)    a. s/p CABG; b. Myoview 06/2011: No ischemia, EF 67%;  c. 01/2013 Cath: LM min irregs, LAD small, LCX 16955m OMs ok, RCA known 100, VG->RCA ok, VG->OM1->2 ok, LIMA->LAD ok.  . Carotid stenosis    Carotid U/S 5/13:  bilat 40-59%  . Chronic kidney disease    renal insufficiency-  . Dyslipidemia   . GERD (gastroesophageal reflux disease)   . Glaucoma   . History of hiatal hernia   . HOH (hard of hearing)   . Hx of CABG    LIMA-LAD, SVG-RCA, SVG-OM1/OM2 in 2009  . Hypertension   . Left bundle branch block   . Ovarian cyst    Not clearly malignant but removed  . PONV (postoperative nausea and vomiting)    nausea  yrs ago  . 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 Spinal arthritis      Sheppard CoilFrank Chaniyah Jahr PharmD., BCPS Clinical Pharmacist Pager [phone] 03/21/2016 2:21 PM

## 2016-03-21 NOTE — Telephone Encounter (Signed)
appt today w/ Scott at 11:45. Pt reminded to go to the ER if her symptoms are unresolved/worse.

## 2016-03-21 NOTE — Telephone Encounter (Addendum)
Spoke to patient. She has been having chest tightness and left arm tingling for about a week and a half ("since Sunday before last"). The arm numbness is more or less continual, chest tightness is intermittent. She does not describe the tightness as severe. Denies SOB, dizziness, other accompanying symptoms.  Notes in the week and a half she has had symptoms, chest tightness not appreciably worse.   Informs me she has NTG SL on-hand but has not used - unsure if expired, (no longer on med list).  Has appt w Dr. Antoine PocheHochrein in 1 week.    We have discussed this case and I have made patient aware that her symptoms sound like they would reasonably be addressed in the ED. She understands, but notes they have been going on 1-2 weeks.  I have advised the patient to have this evaluated in the hospital ED if her symptoms change/worsen, and that I will check on available appointments to see if patient can be evaluated today in clinic.

## 2016-03-21 NOTE — Telephone Encounter (Signed)
Patient admitted Angela NewcomerScott Tienna Bienkowski, [place]   03/21/2016 2:04 PM

## 2016-03-21 NOTE — H&P (Signed)
Cardiology Office Note:    Date:  03/21/2016   ID:  [name], DOB [birth year], MRN [id number]  PCP:  Tarri Fuller, MD  Cardiologist:  Dr. [name]  TAVR: Dr. Tonny Bollman  Electrophysiologist:  n/a  Referring MD: Tarri Fuller, MD   Chief Complaint  Patient presents with  . Chest Pain    History of Present Illness:    [name] is a 82 y.o. female with a hx of bioprosthetic AVR in 2009 and subsequent valve in valve TAVR for severe prosthetic valve disease in 3/16, CAD s/p CABG in 2009, HTN HL, CKD.  Last seen by Dr. Excell Seltzer in 3/17. Gradients of her valve were acceptable considering valve in valve approach and small surgical prosthesis.  Patient called in today for chest pain and was added on urgently for further evaluation.  She notes substernal chest pain and L arm pain with assoc tingling. The arm tingling has been constant over the past 2 days and she is having pain here in the office.  She feels that her pain may get worse with exertion and she denies using NTG.  She denies significant dyspnea.  She does note some L back pain with exertion that resolves with rest.  She denies orthopnea, PND, edema.    Prior CV studies that were reviewed today include:    Echo 09/21/15 Mod LVH, EF 60-65%, no RWMA, Gr 1 DD, s/p TAVR, higher than expected typical gradients, mean 21 mmHg, peak 37 mmHg, trivial MR, PASP 34 mmHg  Carotid US 12/16 R ICA 40-59%; LICA <40%  LHC 12/15 Left mainstem: The left main stem is ectatic. There is heavy calcification without obstructive disease. Left anterior descending (LAD): The LAD is severely calcified. The vessel has 90% proximal stenosis. The first diagonal is seen to fill faintly. The mid and distal LAD fill from the mammary graft. Left circumflex (LCx): The left circumflex appears to have severe ostial stenosis. This is estimated at 80%. The mid circumflex is totally occluded. The obtuse marginal branches fill entirely from a  sequential saphenous vein graft. Right coronary artery (RCA): Severe calcification with total occlusion proximally. LIMA to LAD: Widely patent to the mid LAD. The mid and distal LAD are small in caliber. The proximal LAD fills retrograde from the graft and this fills the diagonal branch. Saphenous vein graft sequential to OM1 and OM 2: Widely patent with graft ectasia noted. There are diffuse irregularities. There are no significant stenoses. Multiple OM subbranch is fill from this bypass graft. Saphenous vein graft to distal RCA: Patent throughout. No significant stenosis identified. The native RCA fills retrograde from the graft. The PDA and PLA branches fill from the graft. Left ventriculography: Ventriculography was deferred because of kidney disease. Plain fluoroscopy demonstrates severe calcification and restricted mobility of prosthetic aortic valve leaflets. Final Conclusions:   1. Severe native three-vessel coronary artery disease 2. Status post multivessel CABG with continued patency of the LIMA to LAD, sequential saphenous vein graft to OM1 and OM 2, and saphenous vein graft to distal RCA 3. Severe bioprosthetic aortic valve stenosis  Myoview 12/12 Normal, EF 67%  Past Medical History:  Diagnosis Date  . Aortic stenosis    a.  s/p tissue AVR at time of CABG in 2009;  b. Echo 04/2012: EF 55-60%, moderate AS (mean 34);  c. Echo 6/14: Mild LVH, mild focal basal septal hypertrophy, EF 55-60%, normal wall motion, grade 2 diastolic dysfunction, AVR with moderate aortic stenosis (mean 36) - consider  TEE if clinically indicated, mild AI, mild MR, PASP 44, ascending aorta mild to moderately dilated (consider CTA or MRA)  . Arthritis   . Blindness of right eye    due to retinal bleed  . CAD (coronary artery disease)    a. s/p CABG; b. Myoview 06/2011: No ischemia, EF 67%;  c. 01/2013 Cath: LM min irregs, LAD small, LCX 171m OMs ok, RCA known 100, VG->RCA ok, VG->OM1->2 ok, LIMA->LAD ok.  .  Carotid stenosis    Carotid U/S 5/13:  bilat 40-59%  . Chronic kidney disease    renal insufficiency-  . Dyslipidemia   . GERD (gastroesophageal reflux disease)   . Glaucoma   . History of hiatal hernia   . HOH (hard of hearing)   . Hx of CABG    LIMA-LAD, SVG-RCA, SVG-OM1/OM2 in 2009  . Hypertension   . Left bundle branch block   . Ovarian cyst    Not clearly malignant but removed  . PONV (postoperative nausea and vomiting)    nausea  yrs ago  . 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 Spinal arthritis     Past Surgical History:  Procedure Laterality Date  . ABDOMINAL HYSTERECTOMY    . ANTERIOR CERVICAL DECOMP/DISCECTOMY FUSION N/A 01/03/2016   Procedure: Anterior Cervical Decompression Fusion Cervical Four-Five ;  Surgeon: Julio Sicks, MD;  Location: [facility];  Service: Neurosurgery;  Laterality: N/A;  . AORTIC VALVE REPLACEMENT  Jan 2009   #21 mm pericardial prosthesis  . APPENDECTOMY    . BACK SURGERY    . CARDIAC CATHETERIZATION  2014  . CHOLECYSTECTOMY    . CORONARY ARTERY BYPASS GRAFT  Jan 2009   LIMA to LAD, SVG to RCA, SVG to OM 1 & 2  . EYE SURGERY Right    retinal detachment blind /yrs ago cataracts  . LEFT AND RIGHT HEART CATHETERIZATION WITH CORONARY/GRAFT ANGIOGRAM N/A 01/13/2013   Procedure: LEFT AND RIGHT HEART CATHETERIZATION WITH Isabel Caprice;  Surgeon: [name], MD;  Location[facility] CATH LAB;  Service: Cardiovascular;  Laterality: N/A;  . LEFT HEART CATHETERIZATION WITH CORONARY/GRAFT ANGIOGRAM N/A 07/03/2014   Procedure: LEFT HEART CATHETERIZATION WITH Isabel Caprice;  Surgeon: Micheline Chapman, MD;  Location: [facility] CATH LAB;  Service: Cardiovascular;  Laterality: N/A;  . NECK SURGERY     cervical  . POSTERIOR CERVICAL LAMINECTOMY Left 11/04/2013    Procedure: Left Cervical Four-Five Foraminotomy ;  Surgeon: Temple Pacini, MD;  Location: [facility];  Service: Neurosurgery;  Laterality: Left;  Left Cervical Four-Five Foraminotomy   . TEE WITHOUT CARDIOVERSION N/A 08/10/2014   Procedure: TRANSESOPHAGEAL ECHOCARDIOGRAM (TEE);  Surgeon: Lars Masson, MD;  Location: [facility] ENDOSCOPY;  Service: Cardiovascular;  Laterality: N/A;  . TEE WITHOUT CARDIOVERSION N/A 09/05/2014   Procedure: TRANSESOPHAGEAL ECHOCARDIOGRAM (TEE);  Surgeon: Micheline Chapman, MD;  Location: [facility];  Service: Open Heart Surgery;  Laterality: N/A;  . TRANSCATHETER AORTIC VALVE REPLACEMENT, TRANSFEMORAL N/A 09/05/2014   Procedure: TRANSCATHETER AORTIC VALVE REPLACEMENT, TRANSFEMORAL;  Surgeon: Micheline Chapman, MD;  Location[facility];  Service: Open Heart Surgery;  Laterality: N/A;    Current Medications: Outpatient Medications Prior to Visit  Medication Sig Dispense Refill  . amLODipine (NORVASC) 2.5 MG tablet Take 2.5 mg by mouth daily.     Marland Kitchen aspirin 81 MG tablet Take 81 mg by  mouth daily.    Marland Kitchen. atorvastatin (LIPITOR) 80 MG tablet Take 1 tablet (80 mg total) by mouth daily. 90 tablet 0  . Calcium Carbonate (CALTRATE 600 PO) Take 600 mg by mouth daily.    . cyclobenzaprine (FLEXERIL) 10 MG tablet Take 1 tablet (10 mg total) by mouth 3 (three) times daily as needed for muscle spasms. 30 tablet 0  . hydrochlorothiazide (HYDRODIURIL) 25 MG tablet Take 1 tablet (25 mg total) by mouth daily. 90 tablet 1  . HYDROcodone-acetaminophen (NORCO/VICODIN) 5-325 MG tablet Take 1-2 tablets by mouth every 4 (four) hours as needed (mild pain). 30 tablet 0  . ibuprofen (ADVIL,MOTRIN) 200 MG tablet Take 600 mg by mouth every 6 (six) hours as needed for headache or moderate pain.    Marland Kitchen. timolol (TIMOPTIC) 0.5 % ophthalmic solution Place 1 drop into both eyes 2 (two) times daily.    . Vitamin D, Ergocalciferol, (DRISDOL) 50000 UNITS CAPS Take 50,000 Units by mouth every 7 (seven) days. On Tuesday    .  zolpidem (AMBIEN) 5 MG tablet Take 5 mg by mouth at bedtime as needed for sleep.      No facility-administered medications prior to visit.       Allergies:   Amoxicillin and Zetia [ezetimibe]   Social History   Social History  . Marital status: Married    Spouse name: N/A  . Number of children: N/A  . Years of education: N/A   Social History Main Topics  . Smoking status: Never Smoker  . Smokeless tobacco: Never Used  . Alcohol use No  . Drug use: No  . Sexual activity: Not Asked   Other Topics Concern  . None   Social History Narrative  . None     Family History:  The patient's family history includes Cancer in her mother; Heart attack in her brother and father.   ROS:   Please see the history of present illness.    Review of Systems  Cardiovascular: Positive for chest pain.  Neurological: Positive for dizziness.   All other systems reviewed and are negative.   EKGs/Labs/Other Test Reviewed:    EKG:  EKG is  ordered today.  The ekg ordered today demonstrates NSR, HR 68, LBBB, no change from prior tracing  Recent Labs: 01/01/2016: BUN 23; Creatinine, Ser 1.49; Hemoglobin 11.9; Platelets 220; Potassium 3.7; Sodium 137   Recent Lipid Panel    Component Value Date/Time   CHOL 175 05/10/2013 1005   TRIG 198.0 (H) 05/10/2013 1005   HDL 37.40 (L) 05/10/2013 1005   CHOLHDL 5 05/10/2013 1005   VLDL 39.6 05/10/2013 1005   LDLCALC 98 05/10/2013 1005   LDLDIRECT 126.2 09/12/2011 0829     Physical Exam:    VS:  BP 130/84   Pulse 68   Ht 5' (1.524 m)   Wt 155 lb (70.3 kg)   BMI 30.27 kg/m     Wt Readings from Last 3 Encounters:  03/21/16 155 lb (70.3 kg)  01/03/16 156 lb 11 oz (71.1 kg)  01/01/16 156 lb 11.2 oz (71.1 kg)     Physical Exam  Constitutional: She is oriented to person, place, and time. She appears well-developed and well-nourished. No distress.  HENT:  Head: Normocephalic and atraumatic.  Eyes: No scleral icterus.  Neck: No JVD present.    Cardiovascular: Normal rate and regular rhythm.   Murmur heard.  Harsh systolic murmur is present with a grade of 2/6  at the upper right sternal border Pulmonary/Chest:  Effort normal. She has no wheezes. She has no rales.  Abdominal: Soft. There is no tenderness.  Musculoskeletal: She exhibits no edema.  Neurological: She is alert and oriented to person, place, and time.  Skin: Skin is warm and dry.  Psychiatric: She has a normal mood and affect.    ASSESSMENT:    1. Coronary artery disease involving native coronary artery of native heart with unstable angina pectoris (HCC)   2. S/P TAVR (transcatheter aortic valve replacement)   3. Bilateral carotid artery disease (HCC)   4. Essential hypertension   5. Dyslipidemia   6. CKD (chronic kidney disease) stage 3, GFR 30-59 ml/min    PLAN:    In order of problems listed above:  1. CAD with [place] - Patient is s/p AVR and CABG in 2009 and subsequent TAVR with valve-in-valve approach in 2016. LHC in 2015 with patent grafts.  Recent echo with stable gradients.  She now presents with symptoms of chest pain and L arm pain and tingling similar to her prior angina.  She is having ongoing pain right now in her L arm.  I have recommended admission to the hospital for further evaluation.  She was also seen by Dr. Verne Carrow (DOD) who agreed.  -  Admit to Tele  -  Cycle enzymes  -  Start IV Heparin  -  Continue Amlodipine, ASA, Lipitor  -  Add Isosorbide 30 mg QD  -  Check repeat Echo  -  Plan tentative cardiac cath Monday   2. S/p TAVR - Recent Echo with stable gradients but it has been 6 mos.  Will get repeat Echo.  Continue SBE prophylaxis.    3. Carotid artery disease - Continue ASA ,statin.  4. HTN - BP controlled.   5. HL - Continue statin.  6. CKD - She will need hydration prior to cath.  Will need to hold HCTZ as well.     Medication Adjustments/Labs and Tests Ordered: Current medicines are reviewed at length with the  patient today.  Concerns regarding medicines are outlined above.  Medication changes, Labs and Tests ordered today are outlined in the Patient Instructions noted below. Patient Instructions  YOU ARE BEING ADMITTED TO [facility] TO UNIT 3 WEST  Signed, Tereso Newcomer, [place]  03/21/2016 1:14 PM    [facility] [address] [place], [place], [place]  [address] Phone: [phone]; Fax: [phone]   I have personally seen and examined this patient with Tereso Newcomer, PA-C. I agree with the assessment and plan as outlined above. She is known to have CAD and is s/p CABG in 2009. She is also s/p TAVR with valve in valve approach (prior bioprosthetic valve placed in 2009 with stenosis). She is now having resting chest and arm pain that is worrisome for unstable angina, in fact it is similar to the pain she had before her bypass surgery.  My exam shows a well developed female, NAD. CV: RRR with soft systolic murmur. Lungs: clear bilaterally. ABd: soft, NT, BS present. Ext: no edema.  No labs to review. Creatinine 1.49 in [place] 2017.  Plan to admit to telemetry and cycle troponin, start IV heparin. Echo to reassess valve. May need cath Monday. Would hydrate this weekend in preparation for possible cardiac cath.   Verne Carrow 03/21/2016 1:27 PM

## 2016-03-21 NOTE — Progress Notes (Signed)
Cardiology Office Note:    Date:  03/21/2016   ID:  [name], DOB Jun 29, 1933, MRN [id number]  PCP:  Tarri Fuller, MD  Cardiologist:  Dr. [name]  TAVR: Dr. Tonny Bollman  Electrophysiologist:  n/a  Referring MD: Tarri Fuller, MD   Chief Complaint  Patient presents with  . Chest Pain    History of Present Illness:    [name] is a 82 y.o. female with a hx of bioprosthetic AVR in 2009 and subsequent valve in valve TAVR for severe prosthetic valve disease in 3/16, CAD s/p CABG in 2009, HTN HL, CKD.  Last seen by Dr. Excell Seltzer in 3/17. Gradients of her valve were acceptable considering valve in valve approach and small surgical prosthesis.  Patient called in today for chest pain and was added on urgently for further evaluation.  She notes substernal chest pain and L arm pain with assoc tingling. The arm tingling has been constant over the past 2 days and she is having pain here in the office.  She feels that her pain may get worse with exertion and she denies using NTG.  She denies significant dyspnea.  She does note some L back pain with exertion that resolves with rest.  She denies orthopnea, PND, edema.    Prior CV studies that were reviewed today include:    Echo 09/21/15 Mod LVH, EF 60-65%, no RWMA, Gr 1 DD, s/p TAVR, higher than expected typical gradients, mean 21 mmHg, peak 37 mmHg, trivial MR, PASP 34 mmHg  Carotid US 12/16 R ICA 40-59%; LICA <40%  LHC 12/15 Left mainstem: The left main stem is ectatic. There is heavy calcification without obstructive disease. Left anterior descending (LAD): The LAD is severely calcified. The vessel has 90% proximal stenosis. The first diagonal is seen to fill faintly. The mid and distal LAD fill from the mammary graft. Left circumflex (LCx): The left circumflex appears to have severe ostial stenosis. This is estimated at 80%. The mid circumflex is totally occluded. The obtuse marginal branches fill entirely from a  sequential saphenous vein graft. Right coronary artery (RCA): Severe calcification with total occlusion proximally. LIMA to LAD: Widely patent to the mid LAD. The mid and distal LAD are small in caliber. The proximal LAD fills retrograde from the graft and this fills the diagonal branch. Saphenous vein graft sequential to OM1 and OM 2: Widely patent with graft ectasia noted. There are diffuse irregularities. There are no significant stenoses. Multiple OM subbranch is fill from this bypass graft. Saphenous vein graft to distal RCA: Patent throughout. No significant stenosis identified. The native RCA fills retrograde from the graft. The PDA and PLA branches fill from the graft. Left ventriculography: Ventriculography was deferred because of kidney disease. Plain fluoroscopy demonstrates severe calcification and restricted mobility of prosthetic aortic valve leaflets. Final Conclusions:   1. Severe native three-vessel coronary artery disease 2. Status post multivessel CABG with continued patency of the LIMA to LAD, sequential saphenous vein graft to OM1 and OM 2, and saphenous vein graft to distal RCA 3. Severe bioprosthetic aortic valve stenosis  Myoview 12/12 Normal, EF 67%  Past Medical History:  Diagnosis Date  . Aortic stenosis    a.  s/p tissue AVR at time of CABG in 2009;  b. Echo 04/2012: EF 55-60%, moderate AS (mean 34);  c. Echo 6/14: Mild LVH, mild focal basal septal hypertrophy, EF 55-60%, normal wall motion, grade 2 diastolic dysfunction, AVR with moderate aortic stenosis (mean 36) - consider  TEE if clinically indicated, mild AI, mild MR, PASP 44, ascending aorta mild to moderately dilated (consider CTA or MRA)  . Arthritis   . Blindness of right eye    due to retinal bleed  . CAD (coronary artery disease)    a. s/p CABG; b. Myoview 06/2011: No ischemia, EF 67%;  c. 01/2013 Cath: LM min irregs, LAD small, LCX 154m OMs ok, RCA known 100, VG->RCA ok, VG->OM1->2 ok, LIMA->LAD ok.  .  Carotid stenosis    Carotid U/S 5/13:  bilat 40-59%  . Chronic kidney disease    renal insufficiency-  . Dyslipidemia   . GERD (gastroesophageal reflux disease)   . Glaucoma   . History of hiatal hernia   . HOH (hard of hearing)   . Hx of CABG    LIMA-LAD, SVG-RCA, SVG-OM1/OM2 in 2009  . Hypertension   . Left bundle branch block   . Ovarian cyst    Not clearly malignant but removed  . PONV (postoperative nausea and vomiting)    nausea  yrs ago  . 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 Spinal arthritis     Past Surgical History:  Procedure Laterality Date  . ABDOMINAL HYSTERECTOMY    . ANTERIOR CERVICAL DECOMP/DISCECTOMY FUSION N/A 01/03/2016   Procedure: Anterior Cervical Decompression Fusion Cervical Four-Five ;  Surgeon: Julio Sicks, MD;  Location: [facility];  Service: Neurosurgery;  Laterality: N/A;  . AORTIC VALVE REPLACEMENT  Jan 2009   #21 mm pericardial prosthesis  . APPENDECTOMY    . BACK SURGERY    . CARDIAC CATHETERIZATION  2014  . CHOLECYSTECTOMY    . CORONARY ARTERY BYPASS GRAFT  Jan 2009   LIMA to LAD, SVG to RCA, SVG to OM 1 & 2  . EYE SURGERY Right    retinal detachment blind /yrs ago cataracts  . LEFT AND RIGHT HEART CATHETERIZATION WITH CORONARY/GRAFT ANGIOGRAM N/A 01/13/2013   Procedure: LEFT AND RIGHT HEART CATHETERIZATION WITH Isabel Caprice;  Surgeon: [name], MD;  Location: [facility] CATH LAB;  Service: Cardiovascular;  Laterality: N/A;  . LEFT HEART CATHETERIZATION WITH CORONARY/GRAFT ANGIOGRAM N/A 07/03/2014   Procedure: LEFT HEART CATHETERIZATION WITH Isabel Caprice;  Surgeon: Micheline Chapman, MD;  Location: [facility] CATH LAB;  Service: Cardiovascular;  Laterality: N/A;  . NECK SURGERY     cervical  . POSTERIOR CERVICAL LAMINECTOMY Left 11/04/2013    Procedure: Left Cervical Four-Five Foraminotomy ;  Surgeon: Temple Pacini, MD;  Location: [facility];  Service: Neurosurgery;  Laterality: Left;  Left Cervical Four-Five Foraminotomy   . TEE WITHOUT CARDIOVERSION N/A 08/10/2014   Procedure: TRANSESOPHAGEAL ECHOCARDIOGRAM (TEE);  Surgeon: Lars Masson, MD;  Location: [facility] ENDOSCOPY;  Service: Cardiovascular;  Laterality: N/A;  . TEE WITHOUT CARDIOVERSION N/A 09/05/2014   Procedure: TRANSESOPHAGEAL ECHOCARDIOGRAM (TEE);  Surgeon: Micheline Chapman, MD;  Location: [facility];  Service: Open Heart Surgery;  Laterality: N/A;  . TRANSCATHETER AORTIC VALVE REPLACEMENT, TRANSFEMORAL N/A 09/05/2014   Procedure: TRANSCATHETER AORTIC VALVE REPLACEMENT, TRANSFEMORAL;  Surgeon: Micheline Chapman, MD;  Location: [facility] OR;  Service: Open Heart Surgery;  Laterality: N/A;    Current Medications: Outpatient Medications Prior to Visit  Medication Sig Dispense Refill  . amLODipine (NORVASC) 2.5 MG tablet Take 2.5 mg by mouth daily.     Marland Kitchen aspirin 81 MG tablet Take 81 mg by  mouth daily.    Marland Kitchen. atorvastatin (LIPITOR) 80 MG tablet Take 1 tablet (80 mg total) by mouth daily. 90 tablet 0  . Calcium Carbonate (CALTRATE 600 PO) Take 600 mg by mouth daily.    . cyclobenzaprine (FLEXERIL) 10 MG tablet Take 1 tablet (10 mg total) by mouth 3 (three) times daily as needed for muscle spasms. 30 tablet 0  . hydrochlorothiazide (HYDRODIURIL) 25 MG tablet Take 1 tablet (25 mg total) by mouth daily. 90 tablet 1  . HYDROcodone-acetaminophen (NORCO/VICODIN) 5-325 MG tablet Take 1-2 tablets by mouth every 4 (four) hours as needed (mild pain). 30 tablet 0  . ibuprofen (ADVIL,MOTRIN) 200 MG tablet Take 600 mg by mouth every 6 (six) hours as needed for headache or moderate pain.    Marland Kitchen. timolol (TIMOPTIC) 0.5 % ophthalmic solution Place 1 drop into both eyes 2 (two) times daily.    . Vitamin D, Ergocalciferol, (DRISDOL) 50000 UNITS CAPS Take 50,000 Units by mouth every 7 (seven) days. On Tuesday    .  zolpidem (AMBIEN) 5 MG tablet Take 5 mg by mouth at bedtime as needed for sleep.      No facility-administered medications prior to visit.       Allergies:   Amoxicillin and Zetia [ezetimibe]   Social History   Social History  . Marital status: Married    Spouse name: N/A  . Number of children: N/A  . Years of education: N/A   Social History Main Topics  . Smoking status: Never Smoker  . Smokeless tobacco: Never Used  . Alcohol use No  . Drug use: No  . Sexual activity: Not Asked   Other Topics Concern  . None   Social History Narrative  . None     Family History:  The patient's family history includes Cancer in her mother; Heart attack in her brother and father.   ROS:   Please see the history of present illness.    Review of Systems  Cardiovascular: Positive for chest pain.  Neurological: Positive for dizziness.   All other systems reviewed and are negative.   EKGs/Labs/Other Test Reviewed:    EKG:  EKG is  ordered today.  The ekg ordered today demonstrates NSR, HR 68, LBBB, no change from prior tracing  Recent Labs: 01/01/2016: BUN 23; Creatinine, Ser 1.49; Hemoglobin 11.9; Platelets 220; Potassium 3.7; Sodium 137   Recent Lipid Panel    Component Value Date/Time   CHOL 175 05/10/2013 1005   TRIG 198.0 (H) 05/10/2013 1005   HDL 37.40 (L) 05/10/2013 1005   CHOLHDL 5 05/10/2013 1005   VLDL 39.6 05/10/2013 1005   LDLCALC 98 05/10/2013 1005   LDLDIRECT 126.2 09/12/2011 0829     Physical Exam:    VS:  BP 130/84   Pulse 68   Ht 5' (1.524 m)   Wt 155 lb (70.3 kg)   BMI 30.27 kg/m     Wt Readings from Last 3 Encounters:  03/21/16 155 lb (70.3 kg)  01/03/16 156 lb 11 oz (71.1 kg)  01/01/16 156 lb 11.2 oz (71.1 kg)     Physical Exam  Constitutional: She is oriented to person, place, and time. She appears well-developed and well-nourished. No distress.  HENT:  Head: Normocephalic and atraumatic.  Eyes: No scleral icterus.  Neck: No JVD present.    Cardiovascular: Normal rate and regular rhythm.   Murmur heard.  Harsh systolic murmur is present with a grade of 2/6  at the upper right sternal border Pulmonary/Chest:  Effort normal. She has no wheezes. She has no rales.  Abdominal: Soft. There is no tenderness.  Musculoskeletal: She exhibits no edema.  Neurological: She is alert and oriented to person, place, and time.  Skin: Skin is warm and dry.  Psychiatric: She has a normal mood and affect.    ASSESSMENT:    1. Coronary artery disease involving native coronary artery of native heart with unstable angina pectoris (HCC)   2. S/P TAVR (transcatheter aortic valve replacement)   3. Bilateral carotid artery disease (HCC)   4. Essential hypertension   5. Dyslipidemia   6. CKD (chronic kidney disease) stage 3, GFR 30-59 ml/min    PLAN:    In order of problems listed above:  1. CAD with [place] - Patient is s/p AVR and CABG in 2009 and subsequent TAVR with valve-in-valve approach in 2016. LHC in 2015 with patent grafts.  Recent echo with stable gradients.  She now presents with symptoms of chest pain and L arm pain and tingling similar to her prior angina.  She is having ongoing pain right now in her L arm.  I have recommended admission to the hospital for further evaluation.  She was also seen by Dr. Verne Carrowhristopher McAlhany (DOD) who agreed.  -  Admit to Tele  -  Cycle enzymes  -  Start IV Heparin  -  Continue Amlodipine, ASA, Lipitor  -  Add Isosorbide 30 mg QD  -  Check repeat Echo  -  Plan tentative cardiac cath Monday   2. S/p TAVR - Recent Echo with stable gradients but it has been 6 mos.  Will get repeat Echo.  Continue SBE prophylaxis.    3. Carotid artery disease - Continue ASA ,statin.  4. HTN - BP controlled.   5. HL - Continue statin.  6. CKD - She will need hydration prior to cath.  Will need to hold HCTZ as well.     Medication Adjustments/Labs and Tests Ordered: Current medicines are reviewed at length with the  patient today.  Concerns regarding medicines are outlined above.  Medication changes, Labs and Tests ordered today are outlined in the Patient Instructions noted below. Patient Instructions  [facility] [place] [facility]  Signed, Tereso NewcomerScott Dylann Layne, [place]-C  03/21/2016 1:21 PM    [facility] [address], [place], [place]  [address] Phone: [phone]; Fax: [phone]

## 2016-03-21 NOTE — Patient Instructions (Addendum)
YOU ARE BEING ADMITTED TO [facility] TO UNIT [address] WEST

## 2016-03-21 NOTE — Progress Notes (Signed)
ANTICOAGULATION CONSULT NOTE Pharmacy Consult for heparin Indication: chest pain/ACS   Assessment: 82 y.o. female with chest pain for heparin   Goal of Therapy:  Heparin level 0.3-0.7 units/ml Monitor platelets by anticoagulation protocol: Yes   Plan:  Decrease Heparin 700 units/hr Follow-up am labs.    Allergies  Allergen Reactions  . Amoxicillin Other (See Comments)    Unknown; Patient tolerated Ancef in May and Zinacef in March 2017.  Marland Kitchen. Zetia [Ezetimibe] Other (See Comments)    DIZZINESS    Patient Measurements: Height: 5' (152.4 cm) Weight: 155 lb (70.3 kg) IBW/kg (Calculated) : 45.5 Heparin Dosing Weight: 60kg  Vital Signs: Temp: 97.6 F (36.4 C) (09/15 2028) Temp Source: Oral (09/15 2028) BP: 141/85 (09/15 2028) Pulse Rate: 95 (09/15 2028)  Labs:  Recent Labs  03/21/16 1431 03/21/16 1921 03/21/16 2245  HGB 12.7  --   --   HCT 40.9  --   --   PLT 242  --   --   APTT 26  --   --   LABPROT 12.7  --   --   INR 0.96  --   --   HEPARINUNFRC  --   --  0.83*  CREATININE 1.33*  --   --   TROPONINI <0.03 0.03*  --     Estimated Creatinine Clearance: 29 mL/min (by C-G formula based on SCr of 1.33 mg/dL (H)).   Geannie RisenGreg Holmes Hays, PharmD, BCPS  03/21/2016 11:54 PM

## 2016-03-21 NOTE — Telephone Encounter (Signed)
New Message  Pt call requesting to speak with RN. Pt wants to get an appt sooner than appt that is scheduled for 9/22. Pt states she has been experiencing left arm tingling, some chest discomfort. Pt states the discomfort feels like tightness. Please call back to discuss

## 2016-03-22 DIAGNOSIS — I200 Unstable angina: Secondary | ICD-10-CM | POA: Diagnosis not present

## 2016-03-22 LAB — URINALYSIS, ROUTINE W REFLEX MICROSCOPIC
Bilirubin Urine: NEGATIVE
GLUCOSE, UA: NEGATIVE mg/dL
Hgb urine dipstick: NEGATIVE
KETONES UR: NEGATIVE mg/dL
Nitrite: NEGATIVE
PH: 6.5 (ref 5.0–8.0)
Protein, ur: NEGATIVE mg/dL
SPECIFIC GRAVITY, URINE: 1.018 (ref 1.005–1.030)

## 2016-03-22 LAB — HEPARIN LEVEL (UNFRACTIONATED): Heparin Unfractionated: 0.66 IU/mL (ref 0.30–0.70)

## 2016-03-22 LAB — URINE MICROSCOPIC-ADD ON: RBC / HPF: NONE SEEN RBC/hpf (ref 0–5)

## 2016-03-22 LAB — TROPONIN I: Troponin I: 0.03 ng/mL (ref <0.03)

## 2016-03-22 LAB — CBC
HEMATOCRIT: 38.1 % (ref 36.0–46.0)
HEMOGLOBIN: 11.8 g/dL — AB (ref 12.0–15.0)
MCH: 25.6 pg — AB (ref 26.0–34.0)
MCHC: 31 g/dL (ref 30.0–36.0)
MCV: 82.6 fL (ref 78.0–100.0)
Platelets: 247 10*3/uL (ref 150–400)
RBC: 4.61 MIL/uL (ref 3.87–5.11)
RDW: 14.8 % (ref 11.5–15.5)
WBC: 14.7 10*3/uL — ABNORMAL HIGH (ref 4.0–10.5)

## 2016-03-22 MED ORDER — SODIUM CHLORIDE 0.9 % IV SOLN
INTRAVENOUS | Status: DC
  Administered 2016-03-23 (×2): via INTRAVENOUS

## 2016-03-22 NOTE — Progress Notes (Signed)
Angela ReedyMichele Lenze called and was updated on the most recent Troponin level and that patient still having some chest pressure and tingling to her left arm but she states it's much less in intensity and patient is refusing pain meds. Patient remain on a Heparin drip at 7.5cc or 750 units per hour and VSS, will continue to monitor.

## 2016-03-22 NOTE — Plan of Care (Signed)
Problem: Fluid Volume: Goal: Ability to maintain a balanced intake and output will improve Outcome: Progressing IVF for hydration started at 75 ml/ houir. VSS.

## 2016-03-22 NOTE — Progress Notes (Signed)
ANTICOAGULATION CONSULT NOTE Pharmacy Consult for heparin Indication: chest pain/ACS   Assessment: 82 y.o. female with chest pain for heparin with probable cath on Mon, 9/18 -HL=0.66 -CBC WNL and stable   Goal of Therapy:  Heparin level 0.3-0.7 units/ml Monitor platelets by anticoagulation protocol: Yes   Plan:  -Continue Heparin 700 units/mL -Daily CBC, HL   Allergies  Allergen Reactions  . Amoxicillin Other (See Comments)    Unknown; Patient tolerated Ancef in May and Zinacef in March 2017.  Marland Kitchen. Zetia [Ezetimibe] Other (See Comments)    DIZZINESS    Patient Measurements: Height: 5' (152.4 cm) Weight: 152 lb 11.2 oz (69.3 kg) IBW/kg (Calculated) : 45.5 Heparin Dosing Weight: 60kg  Vital Signs: Temp: 97.4 F (36.3 C) (09/16 0753) Temp Source: Axillary (09/16 0753) BP: 115/61 (09/16 0753) Pulse Rate: 84 (09/16 0753)  Labs:  Recent Labs  03/21/16 1431 03/21/16 1921 03/21/16 2245 03/22/16 0200 03/22/16 0838  HGB 12.7  --   --   --  11.8*  HCT 40.9  --   --   --  38.1  PLT 242  --   --   --  247  APTT 26  --   --   --   --   LABPROT 12.7  --   --   --   --   INR 0.96  --   --   --   --   HEPARINUNFRC  --   --  0.83*  --  0.66  CREATININE 1.33*  --   --   --   --   TROPONINI <0.03 0.03*  --  0.03*  --     Estimated Creatinine Clearance: 28.8 mL/min (by C-G formula based on SCr of 1.33 mg/dL (H)).  Gwyndolyn KaufmanKai Styles Fambro Bernette Redbird(Kenny), PharmD  PGY1 Pharmacy Resident Pager: [phone] 03/22/2016 9:51 AM

## 2016-03-22 NOTE — Progress Notes (Signed)
Patient Name: [name] Date of Encounter: 03/22/2016  Cardiologist:  Dr. Rollene RotundaJames Hochrein  TAVR: Dr. Lynnae JanuaryMichael Cooper   Hospital Problem List     Active Problems:   Unstable angina [facility](HCC)     Subjective   Feeling better, no significant chest pain, no significant shortness of breath. She still feeling some tingling down her left arm  Inpatient Medications    . amLODipine  2.5 mg Oral Daily  . aspirin EC  81 mg Oral Daily  . atorvastatin  80 mg Oral Daily  . isosorbide mononitrate  30 mg Oral Daily  . sodium chloride flush  3 mL Intravenous Q12H  . timolol  1 drop Both Eyes BID   Scheduled Meds: . amLODipine  2.5 mg Oral Daily  . aspirin EC  81 mg Oral Daily  . atorvastatin  80 mg Oral Daily  . isosorbide mononitrate  30 mg Oral Daily  . sodium chloride flush  3 mL Intravenous Q12H  . timolol  1 drop Both Eyes BID   Continuous Infusions: . heparin 700 Units/hr (03/22/16 0000)   PRN Meds:.sodium chloride, acetaminophen, cyclobenzaprine, HYDROcodone-acetaminophen, nitroGLYCERIN, ondansetron (ZOFRAN) IV, sodium chloride flush, zolpidem  Vital Signs    Vitals:   03/21/16 2028 03/22/16 0500 03/22/16 0753 03/22/16 1122  BP: (!) 141/85 (!) 131/59 115/61 126/66  Pulse: 95 84 84 84  Resp: 18 18 18 17   Temp: 97.6 F (36.4 C) 97.3 F (36.3 C) 97.4 F (36.3 C) 97.8 F (36.6 C)  TempSrc: Oral Oral Axillary Oral  SpO2: 100% 98% 98% 98%  Weight:  152 lb 11.2 oz (69.3 kg)    Height:        Intake/Output Summary (Last 24 hours) at 03/22/16 1135 Last data filed at 03/22/16 0600  Gross per 24 hour  Intake           331.25 ml  Output              850 ml  Net          -518.75 ml   Filed Weights   03/21/16 1352 03/22/16 0500  Weight: 155 lb (70.3 kg) 152 lb 11.2 oz (69.3 kg)    Physical Exam    GEN: Well nourished, Elderly well developed, in no acute distress.  HEENT: Grossly normal.  Neck: Supple, no JVD, carotid bruits, or masses. Cardiac: RRR, 2/6 systolic  right upper sternal border murmur, no rubs, or gallops. No clubbing, cyanosis, edema.  Radials/DP/PT 2+ and equal bilaterally.  Respiratory:  Respirations regular and unlabored, clear to auscultation bilaterally. GI: Soft, nontender, nondistended, BS + x 4. MS: no deformity or atrophy. Skin: warm and dry, no rash. Neuro:  Strength and sensation are intact. Psych: AAOx3.  Normal affect.  Labs    CBC  Recent Labs  03/21/16 1431 03/22/16 0838  WBC 14.0* 14.7*  NEUTROABS 11.3*  --   HGB 12.7 11.8*  HCT 40.9 38.1  MCV 83.1 82.6  PLT 242 247   Basic Metabolic Panel  Recent Labs  03/21/16 1431  NA 138  K 3.5  CL 103  CO2 24  GLUCOSE 159*  BUN 25*  CREATININE 1.33*  CALCIUM 10.3   Liver Function Tests  Recent Labs  03/21/16 1431  AST 25  ALT 21  ALKPHOS 64  BILITOT 1.0  PROT 7.1  ALBUMIN 4.2   No results for input(s): LIPASE, AMYLASE in the last 72 hours. Cardiac Enzymes  Recent Labs  03/21/16 1431 03/21/16 1921  03/22/16 0200  TROPONINI <0.03 0.03* 0.03*   BNP Invalid input(s): POCBNP D-Dimer No results for input(s): DDIMER in the last 72 hours. Hemoglobin A1C No results for input(s): HGBA1C in the last 72 hours. Fasting Lipid Panel No results for input(s): CHOL, HDL, LDLCALC, TRIG, CHOLHDL, LDLDIRECT in the last 72 hours. Thyroid Function Tests No results for input(s): TSH, T4TOTAL, T3FREE, THYROIDAB in the last 72 hours.  Invalid input(s): FREET3  Telemetry    No adverse arrhythmias personally viewed  ECG    Sinus rhythm left bundle branch block 68 personally viewed-no change  Radiology    X-ray Chest Pa And Lateral  Result Date: 03/21/2016 CLINICAL DATA:  Chest pain and shortness of breath all day. EXAM: CHEST  2 VIEW COMPARISON:  09/06/2014 FINDINGS: Lungs are hyperexpanded. Hyperexpansion is consistent with emphysema. The lungs are clear wiithout focal pneumonia, edema, pneumothorax or pleural effusion. Cardiopericardial silhouette is  at upper limits of normal for size. Patient is status post median sternotomy and cardiac valve replacement. The visualized bony structures of the thorax are intact. Telemetry leads overlie the chest. IMPRESSION: Emphysema without acute cardiopulmonary findings. Electronically Signed   By: Kennith Center M.D.   On: 03/21/2016 16:22   Echo 09/21/15 Mod LVH, EF 60-65%, no RWMA, Gr 1 DD, s/p TAVR, higher than expected typical gradients, mean 21 mmHg, peak 37 mmHg, trivial MR, PASP 34 mmHg  Carotid US 12/16 R ICA 40-59%; LICA <40%  LHC 12/15 Left mainstem:The left main stem is ectatic. There is heavy calcification without obstructive disease. Left anterior descending (LAD):The LAD is severely calcified. The vessel has 90% proximal stenosis. The first diagonal is seen to fill faintly. The mid and distal LAD fill from the mammary graft. Left circumflex (LCx):The left circumflex appears to have severe ostial stenosis. This is estimated at 80%. The mid circumflex is totally occluded. The obtuse marginal branches fill entirely from a sequential saphenous vein graft. Right coronary artery (RCA):Severe calcification with total occlusion proximally. LIMA to LAD: Widely patent to the mid LAD. The mid and distal LAD are small in caliber. The proximal LAD fills retrograde from the graft and this fills the diagonal branch. Saphenous vein graft sequential to OM1 and OM 2: Widely patent with graft ectasia noted. There are diffuse irregularities. There are no significant stenoses. Multiple OM subbranch is fill from this bypass graft. Saphenous vein graft to distal RCA: Patent throughout. No significant stenosis identified. The native RCA fills retrograde from the graft. The PDA and PLA branches fill from the graft. Left ventriculography: Ventriculography was deferred because of kidney disease. Plain fluoroscopy demonstrates severe calcification and restricted mobility of prosthetic aortic valve leaflets. Final  Conclusions:  1. Severe native three-vessel coronary artery disease 2. Status post multivessel CABG with continued patency of the LIMA to LAD, sequential saphenous vein graft to OM1 and OM 2, and saphenous vein graft to distal RCA 3. Severe bioprosthetic aortic valve stenosis  Patient Profile     82 year old female with bioprosthetic aortic valve 2009 with subsequent valve and valve TAVR in 09/2014 with coronary disease status post CABG in 2009, hypertension, hyperlipidemia, chronic kidney disease creatinine 1.3 here with chest pain/unstable angina  Assessment & Plan    Unstable angina  - Substernal chest pain with left arm pain and tingling.  - Troponin 0.03  - Heparin IV  - Awaiting cardiac catheterization Monday  - IV fluid hydration, normal saline 75 mL an hour  - Isosorbide 30 mg  - Plan on repeating echocardiogram  -  Enzymes as above  Chronic kidney disease  - Creatinine 1.3-IV fluids  - Hold HCTZ prior to cardiac catheterization  Aortic valve dysfunction  - Mean 21 mmHg, valve in valve  Carotid artery disease  - Moderate plaque bilaterally  - Secondary prevention  Signed, Donato Schultz, MD  03/22/2016, 11:35 AM

## 2016-03-22 NOTE — Progress Notes (Signed)
Report received in [facility]'s room via [facility] using [facility] format, reviewed orders, VS, labs, tests and patient's general condition, assumed care of patient.

## 2016-03-23 ENCOUNTER — Observation Stay (HOSPITAL_BASED_OUTPATIENT_CLINIC_OR_DEPARTMENT_OTHER): Payer: Medicare HMO

## 2016-03-23 ENCOUNTER — Encounter (HOSPITAL_COMMUNITY): Payer: Self-pay | Admitting: *Deleted

## 2016-03-23 DIAGNOSIS — R079 Chest pain, unspecified: Secondary | ICD-10-CM | POA: Diagnosis not present

## 2016-03-23 DIAGNOSIS — I200 Unstable angina: Secondary | ICD-10-CM | POA: Diagnosis not present

## 2016-03-23 LAB — CBC
HEMATOCRIT: 31.7 % — AB (ref 36.0–46.0)
HEMOGLOBIN: 10 g/dL — AB (ref 12.0–15.0)
MCH: 26.2 pg (ref 26.0–34.0)
MCHC: 31.5 g/dL (ref 30.0–36.0)
MCV: 83.2 fL (ref 78.0–100.0)
PLATELETS: 172 10*3/uL (ref 150–400)
RBC: 3.81 MIL/uL — AB (ref 3.87–5.11)
RDW: 14.9 % (ref 11.5–15.5)
WBC: 12.7 10*3/uL — AB (ref 4.0–10.5)

## 2016-03-23 LAB — ECHOCARDIOGRAM COMPLETE
HEIGHTINCHES: 60 in
WEIGHTICAEL: 2483.2 [oz_av]

## 2016-03-23 LAB — HEPARIN LEVEL (UNFRACTIONATED): Heparin Unfractionated: 0.48 IU/mL (ref 0.30–0.70)

## 2016-03-23 NOTE — Progress Notes (Signed)
  Echocardiogram 2D Echocardiogram has been performed.  Cathie BeamsGREGORY, Jaydien Panepinto 03/23/2016, 12:09 PM

## 2016-03-23 NOTE — Progress Notes (Signed)
ANTICOAGULATION CONSULT NOTE Pharmacy Consult for heparin Indication: chest pain/ACS   Assessment: 82 y.o. female with chest pain for heparin with probable cath on Mon, 9/18. Has a history of bioprosthetic AVR in 2009 and subsequent TAVR for severe prosthetic valve disease in 3/16, CAD s/p CABG in 2009, HTN HL, CKD.  -HL=0.48 -Hgb decreased to 10.0 today, Plts WNL   Goal of Therapy:  Heparin level 0.3-0.7 units/ml Monitor platelets by anticoagulation protocol: Yes   Plan:  -Continue Heparin 700 units/mL -Daily CBC, HL  -Monitor S/Sx of bleeding  Allergies  Allergen Reactions  . Amoxicillin Other (See Comments)    Unknown; Patient tolerated Ancef in May and Zinacef in March 2017.  Marland Kitchen. Zetia [Ezetimibe] Other (See Comments)    DIZZINESS    Patient Measurements: Height: 5' (152.4 cm) Weight: 155 lb 3.2 oz (70.4 kg) IBW/kg (Calculated) : 45.5 Heparin Dosing Weight: 60kg  Vital Signs: Temp: 97.8 F (36.6 C) (09/17 0538) Temp Source: Oral (09/17 0538) BP: 120/62 (09/17 0538) Pulse Rate: 88 (09/17 0538)  Labs:  Recent Labs  03/21/16 1431 03/21/16 1921 03/21/16 2245 03/22/16 0200 03/22/16 0838 03/23/16 0519  HGB 12.7  --   --   --  11.8* 10.0*  HCT 40.9  --   --   --  38.1 31.7*  PLT 242  --   --   --  247 172  APTT 26  --   --   --   --   --   LABPROT 12.7  --   --   --   --   --   INR 0.96  --   --   --   --   --   HEPARINUNFRC  --   --  0.83*  --  0.66 0.48  CREATININE 1.33*  --   --   --   --   --   TROPONINI <0.03 0.03*  --  0.03*  --   --     Estimated Creatinine Clearance: 29.1 mL/min (by C-G formula based on SCr of 1.33 mg/dL (H)).  Gwyndolyn KaufmanKai Evangelos Paulino Bernette Redbird(Kenny), PharmD  PGY1 Pharmacy Resident Pager: [phone] 03/23/2016 8:03 AM

## 2016-03-23 NOTE — Progress Notes (Signed)
Patient Name: Kanyla Katherine BassetE Rosano Date of Encounter: 03/23/2016  Cardiologist:  Dr. Rollene RotundaJames Hochrein  TAVR: Dr. Lynnae JanuaryMichael Cooper   Hospital Problem List     Active Problems:   Unstable angina [facility](HCC)     Subjective   Feeling better, no significant chest pain, no significant shortness of breath. She still feeling some tingling down her left arm, unchanged.   Inpatient Medications    . amLODipine  2.5 mg Oral Daily  . aspirin EC  81 mg Oral Daily  . atorvastatin  80 mg Oral Daily  . isosorbide mononitrate  30 mg Oral Daily  . sodium chloride flush  3 mL Intravenous Q12H  . timolol  1 drop Both Eyes BID   Scheduled Meds: . amLODipine  2.5 mg Oral Daily  . aspirin EC  81 mg Oral Daily  . atorvastatin  80 mg Oral Daily  . isosorbide mononitrate  30 mg Oral Daily  . sodium chloride flush  3 mL Intravenous Q12H  . timolol  1 drop Both Eyes BID   Continuous Infusions: . sodium chloride 75 mL/hr at 03/23/16 0305  . heparin 700 Units/hr (03/22/16 2300)   PRN Meds:.sodium chloride, acetaminophen, cyclobenzaprine, HYDROcodone-acetaminophen, nitroGLYCERIN, ondansetron (ZOFRAN) IV, sodium chloride flush, zolpidem  Vital Signs    Vitals:   03/22/16 2023 03/22/16 2359 03/23/16 0538 03/23/16 0815  BP: 126/67 (!) 117/55 120/62   Pulse: 80 81 88   Resp: 15 13 17    Temp: 98 F (36.7 C) 98.3 F (36.8 C) 97.8 F (36.6 C) 99 F (37.2 C)  TempSrc: Oral Oral Oral Oral  SpO2: 98% 98% 98%   Weight:   155 lb 3.2 oz (70.4 kg)   Height:        Intake/Output Summary (Last 24 hours) at 03/23/16 1233 Last data filed at 03/23/16 0700  Gross per 24 hour  Intake          1722.75 ml  Output             1300 ml  Net           422.75 ml   Filed Weights   03/21/16 1352 03/22/16 0500 03/23/16 0538  Weight: 155 lb (70.3 kg) 152 lb 11.2 oz (69.3 kg) 155 lb 3.2 oz (70.4 kg)    Physical Exam    GEN: Well nourished, Elderly well developed, in no acute distress.  HEENT: Grossly normal.  Neck:  Supple, no JVD, carotid bruits, or masses. Cardiac: RRR, 2/6 systolic right upper sternal border murmur, no rubs, or gallops. No clubbing, cyanosis, edema.  Radials/DP/PT 2+ and equal bilaterally.  Respiratory:  Respirations regular and unlabored, clear to auscultation bilaterally. GI: Soft, nontender, nondistended, BS + x 4. MS: no deformity or atrophy. Skin: warm and dry, no rash. Neuro:  Strength and sensation are intact. Psych: AAOx3.  Normal affect.  Labs    CBC  Recent Labs  03/21/16 1431 03/22/16 0838 03/23/16 0519  WBC 14.0* 14.7* 12.7*  NEUTROABS 11.3*  --   --   HGB 12.7 11.8* 10.0*  HCT 40.9 38.1 31.7*  MCV 83.1 82.6 83.2  PLT 242 247 172   Basic Metabolic Panel  Recent Labs  03/21/16 1431  NA 138  K 3.5  CL 103  CO2 24  GLUCOSE 159*  BUN 25*  CREATININE 1.33*  CALCIUM 10.3   Liver Function Tests  Recent Labs  03/21/16 1431  AST 25  ALT 21  ALKPHOS 64  BILITOT 1.0  PROT  7.1  ALBUMIN 4.2   No results for input(s): LIPASE, AMYLASE in the last 72 hours. Cardiac Enzymes  Recent Labs  03/21/16 1431 03/21/16 1921 03/22/16 0200  TROPONINI <0.03 0.03* 0.03*     Telemetry    No adverse arrhythmias personally viewed  ECG    Sinus rhythm left bundle branch block 68 personally viewed-no change  Radiology    X-ray Chest Pa And Lateral  Result Date: 03/21/2016 CLINICAL DATA:  Chest pain and shortness of breath all day. EXAM: CHEST  2 VIEW COMPARISON:  09/06/2014 FINDINGS: Lungs are hyperexpanded. Hyperexpansion is consistent with emphysema. The lungs are clear wiithout focal pneumonia, edema, pneumothorax or pleural effusion. Cardiopericardial silhouette is at upper limits of normal for size. Patient is status post median sternotomy and cardiac valve replacement. The visualized bony structures of the thorax are intact. Telemetry leads overlie the chest. IMPRESSION: Emphysema without acute cardiopulmonary findings. Electronically Signed   By:  Kennith Center M.D.   On: 03/21/2016 16:22   Echo 09/21/15 Mod LVH, EF 60-65%, no RWMA, Gr 1 DD, s/p TAVR, higher than expected typical gradients, mean 21 mmHg, peak 37 mmHg, trivial MR, PASP 34 mmHg  Carotid US 12/16 R ICA 40-59%; LICA <40%  LHC 12/15 Left mainstem:The left main stem is ectatic. There is heavy calcification without obstructive disease. Left anterior descending (LAD):The LAD is severely calcified. The vessel has 90% proximal stenosis. The first diagonal is seen to fill faintly. The mid and distal LAD fill from the mammary graft. Left circumflex (LCx):The left circumflex appears to have severe ostial stenosis. This is estimated at 80%. The mid circumflex is totally occluded. The obtuse marginal branches fill entirely from a sequential saphenous vein graft. Right coronary artery (RCA):Severe calcification with total occlusion proximally. LIMA to LAD: Widely patent to the mid LAD. The mid and distal LAD are small in caliber. The proximal LAD fills retrograde from the graft and this fills the diagonal branch. Saphenous vein graft sequential to OM1 and OM 2: Widely patent with graft ectasia noted. There are diffuse irregularities. There are no significant stenoses. Multiple OM subbranch is fill from this bypass graft. Saphenous vein graft to distal RCA: Patent throughout. No significant stenosis identified. The native RCA fills retrograde from the graft. The PDA and PLA branches fill from the graft. Left ventriculography: Ventriculography was deferred because of kidney disease. Plain fluoroscopy demonstrates severe calcification and restricted mobility of prosthetic aortic valve leaflets. Final Conclusions:  1. Severe native three-vessel coronary artery disease 2. Status post multivessel CABG with continued patency of the LIMA to LAD, sequential saphenous vein graft to OM1 and OM 2, and saphenous vein graft to distal RCA 3. Severe bioprosthetic aortic valve stenosis  Patient  Profile     82 year old female with bioprosthetic aortic valve 2009 with subsequent valve and valve TAVR in 09/2014 with coronary disease status post CABG in 2009, hypertension, hyperlipidemia, chronic kidney disease creatinine 1.3 here with chest pain/unstable angina  Assessment & Plan    Unstable angina  - Substernal chest pain with left arm pain and tingling.  - Troponin 0.03  - Heparin IV  - Awaiting cardiac catheterization Monday  - IV fluid hydration, normal saline 75 mL an hour  - Isosorbide 30 mg  - echocardiogram done today - await read  - Enzymes as above  Chronic kidney disease  - Creatinine 1.3-IV fluids  - Hold HCTZ prior to cardiac catheterization  Aortic valve dysfunction  - Mean 21 mmHg, valve in valve AVR +  TAVR  Carotid artery disease  - Moderate plaque bilaterally  - Secondary prevention  Signed, Donato Schultz, MD  03/23/2016, 12:33 PM

## 2016-03-24 ENCOUNTER — Observation Stay (HOSPITAL_COMMUNITY): Payer: Medicare HMO | Admitting: Anesthesiology

## 2016-03-24 ENCOUNTER — Encounter (HOSPITAL_COMMUNITY)
Admission: AD | Disposition: A | Discharge status: Home / Self Care | Payer: Self-pay | Source: Ambulatory Visit | Attending: Cardiovascular Disease

## 2016-03-24 ENCOUNTER — Encounter (HOSPITAL_COMMUNITY): Payer: Self-pay | Admitting: *Deleted

## 2016-03-24 DIAGNOSIS — H540 Blindness, both eyes: Secondary | ICD-10-CM | POA: Diagnosis not present

## 2016-03-24 DIAGNOSIS — M1990 Unspecified osteoarthritis, unspecified site: Secondary | ICD-10-CM | POA: Diagnosis not present

## 2016-03-24 DIAGNOSIS — D649 Anemia, unspecified: Secondary | ICD-10-CM | POA: Diagnosis not present

## 2016-03-24 DIAGNOSIS — Z7982 Long term (current) use of aspirin: Secondary | ICD-10-CM | POA: Diagnosis not present

## 2016-03-24 DIAGNOSIS — I129 Hypertensive chronic kidney disease with stage 1 through stage 4 chronic kidney disease, or unspecified chronic kidney disease: Secondary | ICD-10-CM | POA: Diagnosis not present

## 2016-03-24 DIAGNOSIS — H409 Unspecified glaucoma: Secondary | ICD-10-CM | POA: Diagnosis not present

## 2016-03-24 DIAGNOSIS — Z79899 Other long term (current) drug therapy: Secondary | ICD-10-CM | POA: Diagnosis not present

## 2016-03-24 DIAGNOSIS — Z953 Presence of xenogenic heart valve: Secondary | ICD-10-CM | POA: Diagnosis not present

## 2016-03-24 DIAGNOSIS — R079 Chest pain, unspecified: Secondary | ICD-10-CM | POA: Diagnosis present

## 2016-03-24 DIAGNOSIS — I25110 Atherosclerotic heart disease of native coronary artery with unstable angina pectoris: Secondary | ICD-10-CM | POA: Diagnosis not present

## 2016-03-24 DIAGNOSIS — H9190 Unspecified hearing loss, unspecified ear: Secondary | ICD-10-CM | POA: Diagnosis present

## 2016-03-24 DIAGNOSIS — E785 Hyperlipidemia, unspecified: Secondary | ICD-10-CM | POA: Diagnosis not present

## 2016-03-24 DIAGNOSIS — Z981 Arthrodesis status: Secondary | ICD-10-CM | POA: Diagnosis not present

## 2016-03-24 DIAGNOSIS — R931 Abnormal findings on diagnostic imaging of heart and coronary circulation: Secondary | ICD-10-CM | POA: Diagnosis not present

## 2016-03-24 DIAGNOSIS — I2510 Atherosclerotic heart disease of native coronary artery without angina pectoris: Secondary | ICD-10-CM | POA: Diagnosis not present

## 2016-03-24 DIAGNOSIS — D500 Iron deficiency anemia secondary to blood loss (chronic): Secondary | ICD-10-CM | POA: Diagnosis not present

## 2016-03-24 DIAGNOSIS — N183 Chronic kidney disease, stage 3 (moderate): Secondary | ICD-10-CM | POA: Diagnosis not present

## 2016-03-24 DIAGNOSIS — Z951 Presence of aortocoronary bypass graft: Secondary | ICD-10-CM | POA: Diagnosis not present

## 2016-03-24 DIAGNOSIS — K254 Chronic or unspecified gastric ulcer with hemorrhage: Secondary | ICD-10-CM | POA: Diagnosis not present

## 2016-03-24 DIAGNOSIS — K219 Gastro-esophageal reflux disease without esophagitis: Secondary | ICD-10-CM | POA: Diagnosis not present

## 2016-03-24 DIAGNOSIS — N189 Chronic kidney disease, unspecified: Secondary | ICD-10-CM | POA: Diagnosis not present

## 2016-03-24 DIAGNOSIS — I200 Unstable angina: Secondary | ICD-10-CM | POA: Diagnosis not present

## 2016-03-24 HISTORY — PX: ESOPHAGOGASTRODUODENOSCOPY (EGD) WITH PROPOFOL: SHX5813

## 2016-03-24 LAB — CBC
HEMATOCRIT: 25 % — AB (ref 36.0–46.0)
Hemoglobin: 7.9 g/dL — ABNORMAL LOW (ref 12.0–15.0)
MCH: 26.1 pg (ref 26.0–34.0)
MCHC: 31.6 g/dL (ref 30.0–36.0)
MCV: 82.5 fL (ref 78.0–100.0)
PLATELETS: 153 10*3/uL (ref 150–400)
RBC: 3.03 MIL/uL — AB (ref 3.87–5.11)
RDW: 15 % (ref 11.5–15.5)
WBC: 8.9 10*3/uL (ref 4.0–10.5)

## 2016-03-24 LAB — HEMOGLOBIN AND HEMATOCRIT, BLOOD
HCT: 25.5 % — ABNORMAL LOW (ref 36.0–46.0)
Hemoglobin: 8 g/dL — ABNORMAL LOW (ref 12.0–15.0)

## 2016-03-24 LAB — HEPARIN LEVEL (UNFRACTIONATED): HEPARIN UNFRACTIONATED: 0.4 [IU]/mL (ref 0.30–0.70)

## 2016-03-24 SURGERY — ESOPHAGOGASTRODUODENOSCOPY (EGD) WITH PROPOFOL
Anesthesia: Monitor Anesthesia Care

## 2016-03-24 MED ORDER — ASPIRIN EC 81 MG PO TBEC: 81.0000 mg | DELAYED_RELEASE_TABLET | Freq: Every day | ORAL | Status: DC

## 2016-03-24 MED ORDER — LIDOCAINE 2% (20 MG/ML) 5 ML SYRINGE
INTRAMUSCULAR | Status: DC | PRN
  Administered 2016-03-24: 60 mg via INTRAVENOUS

## 2016-03-24 MED ORDER — FENTANYL CITRATE (PF) 100 MCG/2ML IJ SOLN: 25.0000 ug | INTRAMUSCULAR | Status: DC | PRN

## 2016-03-24 MED ORDER — PROPOFOL 500 MG/50ML IV EMUL
INTRAVENOUS | Status: DC | PRN
  Administered 2016-03-24: 100 ug/kg/min via INTRAVENOUS

## 2016-03-24 MED ORDER — MEPERIDINE HCL 25 MG/ML IJ SOLN: 6.2500 mg | INTRAMUSCULAR | Status: DC | PRN

## 2016-03-24 MED ORDER — LACTATED RINGERS IV SOLN
INTRAVENOUS | Status: DC | PRN
  Administered 2016-03-24: 11:00:00 via INTRAVENOUS

## 2016-03-24 MED ORDER — PANTOPRAZOLE SODIUM 40 MG PO TBEC
40.0000 mg | DELAYED_RELEASE_TABLET | Freq: Every day | ORAL | Status: DC
  Administered 2016-03-24 – 2016-03-26 (×3): 40 mg via ORAL
  Filled 2016-03-24 (×3): qty 1

## 2016-03-24 MED ORDER — SODIUM CHLORIDE 0.9 % IV SOLN: INTRAVENOUS | Status: DC

## 2016-03-24 NOTE — Progress Notes (Signed)
Pt c/o Left Arm/Jaw Pain 7/10.  Began after she got OOB and walked to bathroom.  1 SL NTG given.  Pt states pain was better but persisted, and 2nd SL NTG given at 0248.  EKG obtained.  2L Oxygen Nasal Canula applied.  Pt states the pain is better. Will use BSC instead of walking to bathroom, pt agrees.     At 0045 BP was 155/67.  After 1st SL Ntg, BP down to 105/62.  At 0248 BP was 144/71, after 2nd SL Ntg, BP down to 106/54.   Will notify MD on call.  Will continue to monitor closely.

## 2016-03-24 NOTE — Op Note (Signed)
[facility] Patient Name: [name] Procedure Date : 03/24/2016 MRN: [id number] Attending MD: Graylin ShiverSalem F Jarelly Rinck , MD Date of Birth: 02/01/1933 CSN: [id number] Age: [age] Admit Type: Inpatient Procedure:                Upper GI endoscopy Indications:              Melena Providers:                Graylin ShiverSalem F. Iker Nuttall, MD, Dwain SarnaPatricia Ford, RN, Rolm BookbinderJackie Aiken                            Tech, Technician, Carmelina DaneElena Walker, CRNA Referring MD:              Medicines:                Propofol per Anesthesia Complications:            No immediate complications. Estimated Blood Loss:     Estimated blood loss: none. Procedure:                Pre-Anesthesia Assessment:                           - Prior to the procedure, a History and Physical                            was performed, and patient medications and                            allergies were reviewed. The patient's tolerance of                            previous anesthesia was also reviewed. The risks                            and benefits of the procedure and the sedation                            options and risks were discussed with the patient.                            All questions were answered, and informed consent                            was obtained. Prior Anticoagulants: The patient has                            taken no previous anticoagulant or antiplatelet                            agents. ASA Grade Assessment: III - A patient with                            severe systemic disease. After reviewing the risks  and benefits, the patient was deemed in                            satisfactory condition to undergo the procedure.                           After obtaining informed consent, the endoscope was                            passed under direct vision. Throughout the                            procedure, the patient's blood pressure, pulse, and                            oxygen saturations  were monitored continuously. The                            EG-[id number] ([id number]) scope was introduced through the                            mouth, and advanced to the second part of duodenum.                            The upper GI endoscopy was accomplished without                            difficulty. The patient tolerated the procedure                            well. Scope In: Scope Out: Findings:      The examined esophagus was normal.      One cratered gastric ulcer was found in the prepyloric region of the       stomach. The lesion was 6 mm in largest dimension. No active bleeding.      The examined duodenum was normal. Impression:               - Normal esophagus.                           - Gastric ulcer.                           - Normal examined duodenum.                           - No specimens collected. Moderate Sedation:      . Recommendation:           - Resume regular diet.                           - Continue present medications.                           - PPI therapy. Check H pylori antibody Procedure Code(s):        ---  Professional ---                           [phone], Esophagogastroduodenoscopy, flexible,                            transoral; diagnostic, including collection of                            specimen(s) by brushing or washing, when performed                            (separate procedure) Diagnosis Code(s):        --- Professional ---                           K25.9, Gastric ulcer, unspecified as acute or                            chronic, without hemorrhage or perforation                           K92.1, Melena (includes Hematochezia) CPT copyright 2016 [employer]. All rights reserved. The codes documented in this report are preliminary and upon coder review may  be revised to meet current compliance requirements. Graylin Shiver, MD 03/24/2016 12:20:12 PM This report has been signed electronically. Number of Addenda: 0

## 2016-03-24 NOTE — Consult Note (Signed)
Subjective:   HPI  The patient is a 82 year old female who was admitted to the hospital with chest pain. She has a history of coronary artery disease and also a history of a bile prosthetic aortic valve replacement in 2009. We were asked to see her by cardiology because yesterday she had a black stool. She also had a drop in her hemoglobin and hematocrit. She denies hematemesis. She denies abdominal pain. She states that 50 years ago she had a bleeding ulcer.  Review of Systems No complaints of shortness of breath  Past Medical History:  Diagnosis Date  . Aortic stenosis    a.  s/p tissue AVR at time of CABG in 2009;  b. Echo 04/2012: EF 55-60%, moderate AS (mean 34);  c. Echo 6/14: Mild LVH, mild focal basal septal hypertrophy, EF 55-60%, normal wall motion, grade 2 diastolic dysfunction, AVR with moderate aortic stenosis (mean 36) - consider TEE if clinically indicated, mild AI, mild MR, PASP 44, ascending aorta mild to moderately dilated (consider CTA or MRA)  . Arthritis   . Blindness of right eye    due to retinal bleed  . CAD (coronary artery disease)    a. s/p CABG; b. Myoview 06/2011: No ischemia, EF 67%;  c. 01/2013 Cath: LM min irregs, LAD small, LCX 165m OMs ok, RCA known 100, VG->RCA ok, VG->OM1->2 ok, LIMA->LAD ok.  . Carotid stenosis    Carotid U/S 5/13:  bilat 40-59%  . Chronic kidney disease    renal insufficiency-  . Dyslipidemia   . GERD (gastroesophageal reflux disease)   . Glaucoma   . History of hiatal hernia   . HOH (hard of hearing)   . Hx of CABG    LIMA-LAD, SVG-RCA, SVG-OM1/OM2 in 2009  . Hypertension   . Left bundle branch block   . Ovarian cyst    Not clearly malignant but removed  . PONV (postoperative nausea and vomiting)    nausea  yrs ago  . S/P TAVR (transcatheter aortic valve replacement) 09/05/2014   20 mm Edwards Sapien 3 transcatheter heart valve placed via open right transfemoral approach for valve-in-valve replacement for prosthetic valve  dysfunction  . Spinal arthritis    Past Surgical History:  Procedure Laterality Date  . ABDOMINAL HYSTERECTOMY    . ANTERIOR CERVICAL DECOMP/DISCECTOMY FUSION N/A 01/03/2016   Procedure: Anterior Cervical Decompression Fusion Cervical Four-Five ;  Surgeon: Julio Sicks, MD;  Location: [facility] NEURO ORS;  Service: Neurosurgery;  Laterality: N/A;  . AORTIC VALVE REPLACEMENT  Jan 2009   #21 mm pericardial prosthesis  . APPENDECTOMY    . BACK SURGERY    . CARDIAC CATHETERIZATION  2014  . CHOLECYSTECTOMY    . CORONARY ARTERY BYPASS GRAFT  Jan 2009   LIMA to LAD, SVG to RCA, SVG to OM 1 & 2  . EYE SURGERY Right    retinal detachment blind /yrs ago cataracts  . LEFT AND RIGHT HEART CATHETERIZATION WITH CORONARY/GRAFT ANGIOGRAM N/A 01/13/2013   Procedure: LEFT AND RIGHT HEART CATHETERIZATION WITH Isabel Caprice;  Surgeon: [name], MD;  Location[facility] C[facility] LAB;  Service: Cardiovascular;  Laterality: N/A;  . LEFT HEART CATHETERIZATION WITH CORONARY/GRAFT ANGIOGRAM N/A 07/03/2014   Procedure: LEFT HEART CATHETERIZATION WITH Isabel Caprice;  Surgeon: Micheline Chapman, MD;  Location: [facility] CATH LAB;  Service: Cardiovascular;  Laterality: N/A;  . NECK SURGERY     cervical  . POSTERIOR CERVICAL LAMINECTOMY Left 11/04/2013   Procedure: Left Cervical Four-Five Foraminotomy ;  Surgeon: Sherilyn Cooter  A Pool, MD;  Location: [facility] NEURO ORS;  Service: Neurosurgery;  Laterality: Left;  Left Cervical Four-Five Foraminotomy   . TEE WITHOUT CARDIOVERSION N/A 08/10/2014   Procedure: TRANSESOPHAGEAL ECHOCARDIOGRAM (TEE);  Surgeon: Lars MassonKatarina H Nelson, MD;  [facility];  Service: Cardiovascular;  Laterality: N/A;  . TEE WITHOUT CARDIOVERSION N/A 09/05/2014   Procedure: TRANSESOPHAGEAL ECHOCARDIOGRAM (TEE);  Surgeon: Micheline ChapmanMichael D Cooper, MD;  [facility];  Service: Open Heart Surgery;  Laterality: N/A;  . TRANSCATHETER AORTIC VALVE REPLACEMENT, TRANSFEMORAL N/A 09/05/2014   Procedure: TRANSCATHETER AORTIC  VALVE REPLACEMENT, TRANSFEMORAL;  Surgeon: Micheline ChapmanMichael D Cooper, MD;  [facility];  Service: Open Heart Surgery;  Laterality: N/A;   Social History   Social History  . Marital status: Married    Spouse name: N/A  . Number of children: N/A  . Years of education: N/A   Occupational History  . Not on file.   Social History Main Topics  . Smoking status: Never Smoker  . Smokeless tobacco: Never Used  . Alcohol use No  . Drug use: No  . Sexual activity: Not on file   Other Topics Concern  . Not on file   Social History Narrative  . No narrative on file   family history includes Cancer in her mother; Heart attack in her brother and father.  Current Facility-Administered Medications:  .  0.9 %  sodium chloride infusion, 250 mL, Intravenous, PRN, Scott T Weaver, PA-C .  0.9 %  sodium chloride infusion, , Intravenous, Continuous, Jake BatheMark C Skains, MD, Last Rate: 75 mL/hr at 03/23/16 1439 .  acetaminophen (TYLENOL) tablet 650 mg, 650 mg, Oral, Q4H PRN, Beatrice LecherScott T Weaver, PA-C, 650 mg at 03/24/16 0101 .  amLODipine (NORVASC) tablet 2.5 mg, 2.5 mg, Oral, Daily, Scott T Weaver, PA-C, 2.5 mg at 03/24/16 0847 .  aspirin EC tablet 81 mg, 81 mg, Oral, Daily, Scott T Weaver, PA-C, 81 mg at 03/24/16 0847 .  atorvastatin (LIPITOR) tablet 80 mg, 80 mg, Oral, Daily, Scott T Weaver, PA-C, 80 mg at 03/24/16 0847 .  cyclobenzaprine (FLEXERIL) tablet 10 mg, 10 mg, Oral, TID PRN, Beatrice LecherScott T Weaver, PA-C .  HYDROcodone-acetaminophen (NORCO/VICODIN) 5-325 MG per tablet 1-2 tablet, 1-2 tablet, Oral, Q4H PRN, Beatrice LecherScott T Weaver, PA-C, 1 tablet at 03/22/16 1052 .  isosorbide mononitrate (IMDUR) 24 hr tablet 30 mg, 30 mg, Oral, Daily, Scott T Weaver, PA-C, 30 mg at 03/24/16 0847 .  nitroGLYCERIN (NITROSTAT) SL tablet 0.4 mg, 0.4 mg, Sublingual, Q5 Min x 3 PRN, Beatrice LecherScott T Weaver, PA-C, 0.4 mg at 03/24/16 0248 .  ondansetron (ZOFRAN) injection 4 mg, 4 mg, Intravenous, Q6H PRN, Scott T Weaver, PA-C .  sodium chloride flush (NS)  0.9 % injection 3 mL, 3 mL, Intravenous, Q12H, Scott T Weaver, PA-C, 3 mL at 03/24/16 0847 .  sodium chloride flush (NS) 0.9 % injection 3 mL, 3 mL, Intravenous, PRN, Scott T Weaver, PA-C .  timolol (TIMOPTIC) 0.5 % ophthalmic solution 1 drop, 1 drop, Both Eyes, BID, Scott T Weaver, PA-C, 1 drop at 03/24/16 0848 .  zolpidem (AMBIEN) tablet 5 mg, 5 mg, Oral, QHS PRN, Beatrice LecherScott T Weaver, PA-C, 5 mg at 03/23/16 2051 Allergies  Allergen Reactions  . Amoxicillin Other (See Comments)    Unknown; Patient tolerated Ancef in May and Zinacef in March 2017.  Marland Kitchen. Zetia [Ezetimibe] Other (See Comments)    DIZZINESS     Objective:     BP (!) 123/55 (BP Location: Left Arm)   Pulse 82  Temp 98.4 F (36.9 C) (Oral)   Resp 11   Ht 5' (1.524 m)   Wt 71.7 kg (158 lb)   SpO2 100%   BMI 30.86 kg/m   No distress  Nonicteric  Heart regular rhythm  Lungs clear  Abdomen soft and nontender  Laboratory No components found for: D1    Assessment:     Melena  Anemia  Heart disease as described by cardiology      Plan:     Cardiology requests EGD because of melena and drop in hemoglobin and hematocrit. The patient is nothing by mouth. We will proceed with EGD today. Lab Results  Component Value Date   HGB 8.0 (L) 03/24/2016   HGB 7.9 (L) 03/24/2016   HGB 10.0 (L) 03/23/2016   HCT 25.5 (L) 03/24/2016   HCT 25.0 (L) 03/24/2016   HCT 31.7 (L) 03/23/2016   ALKPHOS 64 03/21/2016   ALKPHOS 81 09/01/2014   ALKPHOS 90 06/27/2014   AST 25 03/21/2016   AST 23 09/01/2014   AST 26 06/27/2014   ALT 21 03/21/2016   ALT 14 09/01/2014   ALT 14 06/27/2014

## 2016-03-24 NOTE — Progress Notes (Addendum)
Patient Name: [name] Date of Encounter: 03/24/2016  Cardiologist:  Dr. Rollene RotundaJames Hochrein  TAVR: Dr. Lynnae JanuaryMichael Cooper   Hospital Problem List     Active Problems:   Unstable angina [facility](HCC)     Subjective   No pain Given anemia I asked her about BM.  Had "black" darker BM yesterday No nausea, vomiting   Inpatient Medications    . amLODipine  2.5 mg Oral Daily  . aspirin EC  81 mg Oral Daily  . atorvastatin  80 mg Oral Daily  . isosorbide mononitrate  30 mg Oral Daily  . sodium chloride flush  3 mL Intravenous Q12H  . timolol  1 drop Both Eyes BID   Scheduled Meds: . amLODipine  2.5 mg Oral Daily  . aspirin EC  81 mg Oral Daily  . atorvastatin  80 mg Oral Daily  . isosorbide mononitrate  30 mg Oral Daily  . sodium chloride flush  3 mL Intravenous Q12H  . timolol  1 drop Both Eyes BID   Continuous Infusions: . sodium chloride 75 mL/hr at 03/23/16 1439  . heparin 700 Units/hr (03/22/16 2300)   PRN Meds:.sodium chloride, acetaminophen, cyclobenzaprine, HYDROcodone-acetaminophen, nitroGLYCERIN, ondansetron (ZOFRAN) IV, sodium chloride flush, zolpidem  Vital Signs    Vitals:   03/24/16 0055 03/24/16 0248 03/24/16 0255 03/24/16 0500  BP: 105/62 (!) 144/71 (!) 106/54 (!) 123/55  Pulse:  87 94 82  Resp: 17 (!) 23 15 11   Temp:    98.4 F (36.9 C)  TempSrc:    Oral  SpO2:  97% 97% 100%  Weight:    71.7 kg (158 lb)  Height:        Intake/Output Summary (Last 24 hours) at 03/24/16 0822 Last data filed at 03/24/16 0300  Gross per 24 hour  Intake             1136 ml  Output              800 ml  Net              336 ml   Filed Weights   03/22/16 0500 03/23/16 0538 03/24/16 0500  Weight: 69.3 kg (152 lb 11.2 oz) 70.4 kg (155 lb 3.2 oz) 71.7 kg (158 lb)    Physical Exam    GEN: Well nourished, Elderly well developed, in no acute distress.  HEENT: Grossly normal.  Neck: Supple, no JVD, carotid bruits, or masses. Cardiac: RRR, 2/6 systolic right upper sternal  border murmur, no rubs, or gallops. No clubbing, cyanosis, edema.  Radials/DP/PT 2+ and equal bilaterally.  Respiratory:  Respirations regular and unlabored, clear to auscultation bilaterally. GI: Soft, nontender, nondistended, BS + x 4. MS: no deformity or atrophy. Skin: warm and dry, no rash. Neuro:  Strength and sensation are intact. Psych: AAOx3.  Normal affect.  Labs    CBC  Recent Labs  03/21/16 1431  03/23/16 0519 03/24/16 0432  WBC 14.0*  < > 12.7* 8.9  NEUTROABS 11.3*  --   --   --   HGB 12.7  < > 10.0* 7.9*  HCT 40.9  < > 31.7* 25.0*  MCV 83.1  < > 83.2 82.5  PLT 242  < > 172 153  < > = values in this interval not displayed. Basic Metabolic Panel  Recent Labs  03/21/16 1431  NA 138  K 3.5  CL 103  CO2 24  GLUCOSE 159*  BUN 25*  CREATININE 1.33*  CALCIUM 10.3  Liver Function Tests  Recent Labs  03/21/16 1431  AST 25  ALT 21  ALKPHOS 64  BILITOT 1.0  PROT 7.1  ALBUMIN 4.2   No results for input(s): LIPASE, AMYLASE in the last 72 hours. Cardiac Enzymes  Recent Labs  03/21/16 1431 03/21/16 1921 03/22/16 0200  TROPONINI <0.03 0.03* 0.03*     Telemetry    No adverse arrhythmias personally viewed  ECG    Sinus rhythm left bundle branch block 68 personally viewed-no change  Radiology    No results found. Echo 09/21/15 Mod LVH, EF 60-65%, no RWMA, Gr 1 DD, s/p TAVR, higher than expected typical gradients, mean 21 mmHg, peak 37 mmHg, trivial MR, PASP 34 mmHg  Carotid US 12/16 R ICA 40-59%; LICA <40%  LHC 12/15 Left mainstem:The left main stem is ectatic. There is heavy calcification without obstructive disease. Left anterior descending (LAD):The LAD is severely calcified. The vessel has 90% proximal stenosis. The first diagonal is seen to fill faintly. The mid and distal LAD fill from the mammary graft. Left circumflex (LCx):The left circumflex appears to have severe ostial stenosis. This is estimated at 80%. The mid circumflex is  totally occluded. The obtuse marginal branches fill entirely from a sequential saphenous vein graft. Right coronary artery (RCA):Severe calcification with total occlusion proximally. LIMA to LAD: Widely patent to the mid LAD. The mid and distal LAD are small in caliber. The proximal LAD fills retrograde from the graft and this fills the diagonal branch. Saphenous vein graft sequential to OM1 and OM 2: Widely patent with graft ectasia noted. There are diffuse irregularities. There are no significant stenoses. Multiple OM subbranch is fill from this bypass graft. Saphenous vein graft to distal RCA: Patent throughout. No significant stenosis identified. The native RCA fills retrograde from the graft. The PDA and PLA branches fill from the graft. Left ventriculography: Ventriculography was deferred because of kidney disease. Plain fluoroscopy demonstrates severe calcification and restricted mobility of prosthetic aortic valve leaflets. Final Conclusions:  1. Severe native three-vessel coronary artery disease 2. Status post multivessel CABG with continued patency of the LIMA to LAD, sequential saphenous vein graft to OM1 and OM 2, and saphenous vein graft to distal RCA 3. Severe bioprosthetic aortic valve stenosis  Patient Profile     82 year old female with bioprosthetic aortic valve 2009 with subsequent valve and valve TAVR in 09/2014 with coronary disease status post CABG in 2009, hypertension, hyperlipidemia, chronic kidney disease creatinine 1.3 here with chest pain/unstable angina  Assessment & Plan    Unstable angina - enzymes negative no acute ECG changes Grafts LIMA to LAD, SVG RCA and Sequential SVG to OM1/OM2 patent pre TAVR cath 07/03/14.  Will stop heparin needs anemia and Hct Drop w/u before proceeding with cath   Chronic kidney disease  - Creatinine 1.3-IV fluids  - Hold HCTZ prior to cardiac catheterization  Aortic valve dysfunction  - Mean 21 mmHg, valve in valve AVR +  TAVR  Carotid artery disease  - Moderate plaque bilaterally  - Secondary prevention  Anemia:  Hct has dropped from 40.9 to 25 will type and screen and ask GI to see Given "black: stool repeat Hct this am. Transfuse if Hct less than 25. Will keep NPO for GI to consider doing EGD today   Signed, Charlton Haws, MD  03/24/2016, 8:22 AM

## 2016-03-24 NOTE — Care Management Obs Status (Signed)
MEDICARE OBSERVATION STATUS NOTIFICATION   Patient Details  Name: [name] MRN: [id number] Date of Birth: 03/13/1933   Medicare Observation Status Notification Given:  Yes    Gala LewandowskyGraves-Bigelow, Toree Edling Kaye, RN 03/24/2016, 1:50 PM

## 2016-03-24 NOTE — Anesthesia Preprocedure Evaluation (Addendum)
Anesthesia Evaluation  Patient identified by MRN, date of birth, ID band Patient awake    Reviewed: Allergy & Precautions, NPO status , Patient's Chart, lab work & pertinent test results  Airway Mallampati: II       Dental  (+) Edentulous Upper, Edentulous Lower   Pulmonary    breath sounds clear to auscultation       Cardiovascular hypertension, + angina + CAD   Rhythm:Regular Rate:Normal     Neuro/Psych    GI/Hepatic GERD  Medicated and Controlled,  Endo/Other    Renal/GU      Musculoskeletal   Abdominal   Peds  Hematology   Anesthesia Other Findings   Reproductive/Obstetrics                            Anesthesia Physical Anesthesia Plan  ASA: III  Anesthesia Plan: MAC   Post-op Pain Management:    Induction: Intravenous  Airway Management Planned: Simple Face Mask  Additional Equipment:   Intra-op Plan:   Post-operative Plan:   Informed Consent: I have reviewed the patients History and Physical, chart, labs and discussed the procedure including the risks, benefits and alternatives for the proposed anesthesia with the patient or authorized representative who has indicated his/her understanding and acceptance.   Dental advisory given  Plan Discussed with: Anesthesiologist, CRNA and Surgeon  Anesthesia Plan Comments:        Anesthesia Quick Evaluation

## 2016-03-24 NOTE — Transfer of Care (Signed)
Immediate Anesthesia Transfer of Care Note  Patient: [name]  Procedure(s) Performed: Procedure(s): ESOPHAGOGASTRODUODENOSCOPY (EGD) WITH PROPOFOL (N/A)  Patient Location: Endoscopy Unit  Anesthesia Type:MAC  Level of Consciousness: awake, alert  and oriented  Airway & Oxygen Therapy: Patient Spontanous Breathing and Patient connected to nasal cannula oxygen  Post-op Assessment: Report given to RN, Post -op Vital signs reviewed and stable and Patient moving all extremities  Post vital signs: Reviewed and stable  Last Vitals:  Vitals:   03/24/16 1112 03/24/16 1217  BP: 137/62 118/60  Pulse: 76 70  Resp: 17 20  Temp:      Last Pain:  Vitals:   03/24/16 1112  TempSrc: Oral  PainSc:       Patients Stated Pain Goal: 0 (03/24/16 0830)  Complications: No apparent anesthesia complications

## 2016-03-24 NOTE — Anesthesia Preprocedure Evaluation (Signed)
Anesthesia Evaluation  Patient identified by MRN, date of birth, ID band Patient awake    Reviewed: Allergy & Precautions, NPO status , Patient's Chart, lab work & pertinent test results  Airway Mallampati: III  TM Distance: >3 FB Neck ROM: Full  Mouth opening: Limited Mouth Opening  Dental  (+) Edentulous Upper, Edentulous Lower, Dental Advisory Given   Pulmonary    breath sounds clear to auscultation       Cardiovascular hypertension, + CAD and + Peripheral Vascular Disease  + dysrhythmias  Rhythm:Regular Rate:Normal     Neuro/Psych    GI/Hepatic GERD  Medicated and Poorly Controlled,  Endo/Other    Renal/GU      Musculoskeletal   Abdominal   Peds  Hematology   Anesthesia Other Findings   Reproductive/Obstetrics                             Anesthesia Physical Anesthesia Plan  ASA: IV  Anesthesia Plan: MAC   Post-op Pain Management:    Induction: Intravenous  Airway Management Planned: Simple Face Mask  Additional Equipment:   Intra-op Plan:   Post-operative Plan:   Informed Consent: I have reviewed the patients History and Physical, chart, labs and discussed the procedure including the risks, benefits and alternatives for the proposed anesthesia with the patient or authorized representative who has indicated his/her understanding and acceptance.   Dental advisory given  Plan Discussed with: CRNA, Anesthesiologist and Surgeon  Anesthesia Plan Comments:         Anesthesia Quick Evaluation

## 2016-03-24 NOTE — Anesthesia Procedure Notes (Signed)
Procedure Name: MAC Date/Time: 03/24/2016 11:54 AM Performed by: Quentin OreWALKER, Sheniece Ruggles E Pre-anesthesia Checklist: Patient identified, Emergency Drugs available, Suction available and Patient being monitored Patient Re-evaluated:Patient Re-evaluated prior to inductionOxygen Delivery Method: Nasal cannula Intubation Type: IV induction Placement Confirmation: positive ETCO2

## 2016-03-25 ENCOUNTER — Encounter (HOSPITAL_COMMUNITY)
Admission: AD | Disposition: A | Discharge status: Home / Self Care | Payer: Self-pay | Source: Ambulatory Visit | Attending: Cardiovascular Disease

## 2016-03-25 DIAGNOSIS — I2510 Atherosclerotic heart disease of native coronary artery without angina pectoris: Secondary | ICD-10-CM

## 2016-03-25 DIAGNOSIS — N189 Chronic kidney disease, unspecified: Secondary | ICD-10-CM

## 2016-03-25 DIAGNOSIS — D649 Anemia, unspecified: Secondary | ICD-10-CM

## 2016-03-25 HISTORY — PX: CARDIAC CATHETERIZATION: SHX172

## 2016-03-25 LAB — HEPARIN LEVEL (UNFRACTIONATED): Heparin Unfractionated: <0.1 IU/mL — ABNORMAL LOW (ref 0.30–0.70)

## 2016-03-25 LAB — CBC
HEMATOCRIT: 24.6 % — AB (ref 36.0–46.0)
HEMOGLOBIN: 7.7 g/dL — AB (ref 12.0–15.0)
MCH: 26.3 pg (ref 26.0–34.0)
MCHC: 31.3 g/dL (ref 30.0–36.0)
MCV: 84 fL (ref 78.0–100.0)
Platelets: 153 10*3/uL (ref 150–400)
RBC: 2.93 MIL/uL — ABNORMAL LOW (ref 3.87–5.11)
RDW: 15.1 % (ref 11.5–15.5)
WBC: 8.3 10*3/uL (ref 4.0–10.5)

## 2016-03-25 LAB — PREPARE RBC (CROSSMATCH)

## 2016-03-25 LAB — HEMOGLOBIN AND HEMATOCRIT, BLOOD
HEMATOCRIT: 30.4 % — AB (ref 36.0–46.0)
HEMOGLOBIN: 9.5 g/dL — AB (ref 12.0–15.0)

## 2016-03-25 SURGERY — CORONARY/GRAFT ANGIOGRAPHY

## 2016-03-25 SURGERY — LEFT HEART CATH AND CORONARY ANGIOGRAPHY

## 2016-03-25 MED ORDER — MIDAZOLAM HCL 2 MG/2ML IJ SOLN
INTRAMUSCULAR | Status: DC | PRN
  Administered 2016-03-25: 1 mg via INTRAVENOUS

## 2016-03-25 MED ORDER — MIDAZOLAM HCL 2 MG/2ML IJ SOLN
INTRAMUSCULAR | Status: AC
  Filled 2016-03-25: qty 2

## 2016-03-25 MED ORDER — LIDOCAINE HCL (PF) 1 % IJ SOLN
INTRAMUSCULAR | Status: DC | PRN
  Administered 2016-03-25: 18 mL

## 2016-03-25 MED ORDER — HEPARIN (PORCINE) IN NACL 2-0.9 UNIT/ML-% IJ SOLN
INTRAMUSCULAR | Status: DC | PRN
  Administered 2016-03-25: 1000 mL

## 2016-03-25 MED ORDER — SODIUM CHLORIDE 0.9 % IV SOLN: 250.0000 mL | INTRAVENOUS | Status: DC | PRN

## 2016-03-25 MED ORDER — CLOPIDOGREL BISULFATE 75 MG PO TABS
75.0000 mg | ORAL_TABLET | Freq: Every day | ORAL | Status: DC
  Administered 2016-03-26: 75 mg via ORAL
  Filled 2016-03-25: qty 1

## 2016-03-25 MED ORDER — LOPERAMIDE HCL 2 MG PO CAPS
4.0000 mg | ORAL_CAPSULE | ORAL | Status: DC | PRN
  Administered 2016-03-25: 4 mg via ORAL
  Filled 2016-03-25 (×2): qty 2

## 2016-03-25 MED ORDER — SODIUM CHLORIDE 0.9 % IV SOLN: Freq: Once | INTRAVENOUS | Status: AC

## 2016-03-25 MED ORDER — LIDOCAINE HCL (PF) 1 % IJ SOLN
INTRAMUSCULAR | Status: AC
  Filled 2016-03-25: qty 30

## 2016-03-25 MED ORDER — SODIUM CHLORIDE 0.9 % IV SOLN: INTRAVENOUS | Status: AC

## 2016-03-25 MED ORDER — ACETAMINOPHEN 325 MG PO TABS: 650.0000 mg | ORAL_TABLET | ORAL | Status: DC | PRN

## 2016-03-25 MED ORDER — SODIUM CHLORIDE 0.9% FLUSH: 3.0000 mL | INTRAVENOUS | Status: DC | PRN

## 2016-03-25 MED ORDER — IOPAMIDOL (ISOVUE-370) INJECTION 76%
INTRAVENOUS | Status: AC
  Filled 2016-03-25: qty 100

## 2016-03-25 MED ORDER — SODIUM CHLORIDE 0.9% FLUSH
3.0000 mL | Freq: Two times a day (BID) | INTRAVENOUS | Status: DC
  Administered 2016-03-26: 3 mL via INTRAVENOUS

## 2016-03-25 MED ORDER — HEPARIN (PORCINE) IN NACL 2-0.9 UNIT/ML-% IJ SOLN
INTRAMUSCULAR | Status: AC
  Filled 2016-03-25: qty 1000

## 2016-03-25 MED ORDER — ONDANSETRON HCL 4 MG/2ML IJ SOLN: 4.0000 mg | Freq: Four times a day (QID) | INTRAMUSCULAR | Status: DC | PRN

## 2016-03-25 MED ORDER — IOPAMIDOL (ISOVUE-370) INJECTION 76%
INTRAVENOUS | Status: DC | PRN
  Administered 2016-03-25: 90 mL via INTRA_ARTERIAL

## 2016-03-25 SURGICAL SUPPLY — 13 items
CATH IMPULSE 5F ANG/FL3.5 (CATHETERS) ×1 IMPLANT
CATH INFINITI 5 FR IM (CATHETERS) ×1 IMPLANT
CATH INFINITI 5FR JL5 (CATHETERS) ×1 IMPLANT
CATH LAUNCHER 5F JL4 (CATHETERS) IMPLANT
CATHETER LAUNCHER 5F JL4 (CATHETERS) ×2
KIT HEART LEFT (KITS) ×2 IMPLANT
PACK CARDIAC CATHETERIZATION (CUSTOM PROCEDURE TRAY) ×2 IMPLANT
SHEATH PINNACLE 5F 10CM (SHEATH) ×1 IMPLANT
SYR MEDRAD MARK V 150ML (SYRINGE) ×2 IMPLANT
TRANSDUCER W/STOPCOCK (MISCELLANEOUS) ×2 IMPLANT
TUBING CIL FLEX 10 FLL-RA (TUBING) ×2 IMPLANT
WIRE EMERALD 3MM-J .035X150CM (WIRE) ×2 IMPLANT
WIRE EMERALD 3MM-J .035X260CM (WIRE) ×1 IMPLANT

## 2016-03-25 NOTE — Progress Notes (Signed)
[facility] Progress Note  Subjective: No specific complaints today. Waiting on her cardiac cath for today. Stool still dark.  Patient had a gastric ulcer seen on EGD yesterday  Objective: Vital signs in last 24 hours: Temp:  [97.6 F (36.4 C)-98.4 F (36.9 C)] 98 F (36.7 C) (09/19 1130) Pulse Rate:  [71-81] 74 (09/19 0906) Resp:  [17-22] 17 (09/19 1130) BP: (106-150)/(51-71) 106/51 (09/19 1130) SpO2:  [98 %-100 %] 98 % (09/19 1130) Weight:  [71.7 kg (158 lb)] 71.7 kg (158 lb) (09/19 0500) Weight change: 0 kg (0 lb)   PE:  No distress  Abdomen soft nontender  Lab Results: Results for orders placed or performed during the hospital encounter of 03/21/16 (from the past 24 hour(s))  CBC     Status: Abnormal   Collection Time: 03/25/16  3:23 AM  Result Value Ref Range   WBC 8.3 4.0 - 10.5 K/uL   RBC 2.93 (L) 3.87 - 5.11 MIL/uL   Hemoglobin 7.7 (L) 12.0 - 15.0 g/dL   HCT 56.224.6 (L) 13.036.0 - 86.546.0 %   MCV 84.0 78.0 - 100.0 fL   MCH 26.3 26.0 - 34.0 pg   MCHC 31.3 30.0 - 36.0 g/dL   RDW 78.415.1 69.611.5 - 29.515.5 %   Platelets 153 150 - 400 K/uL  Heparin level (unfractionated)     Status: Abnormal   Collection Time: 03/25/16  3:23 AM  Result Value Ref Range   Heparin Unfractionated <0.10 (L) 0.30 - 0.70 IU/mL  Prepare RBC     Status: None   Collection Time: 03/25/16  8:10 AM  Result Value Ref Range   Order Confirmation ORDER PROCESSED BY BLOOD BANK     Studies/Results: No results found.    Assessment: Melena  Gastric ulcer  Plan:   Continue medical treatment for gastric ulcer. I do not think she needs a colonoscopy. I think the gastric ulcer explains her melena and anemia.    Gwenevere AbbotSAM F Numair Masden 03/25/2016, 2:39 PM  Pager: [phone] If no answer or after 5 PM call [phone] Lab Results  Component Value Date   HGB 7.7 (L) 03/25/2016   HGB 8.0 (L) 03/24/2016   HGB 7.9 (L) 03/24/2016   HCT 24.6 (L) 03/25/2016   HCT 25.5 (L) 03/24/2016   HCT 25.0 (L)  03/24/2016   ALKPHOS 64 03/21/2016   ALKPHOS 81 09/01/2014   ALKPHOS 90 06/27/2014   AST 25 03/21/2016   AST 23 09/01/2014   AST 26 06/27/2014   ALT 21 03/21/2016   ALT 14 09/01/2014   ALT 14 06/27/2014

## 2016-03-25 NOTE — Progress Notes (Signed)
Site area: right groin fa sheath Site Prior to Removal:  Level 0 Pressure Applied For:  20 minutes Manual:   yes Patient Status During Pull:  stable Post Pull Site:  Level  0 Post Pull Instructions Given:  yes Post Pull Pulses Present: yes Dressing Applied:  tegaderm Bedrest begins @  1720 Comments:

## 2016-03-25 NOTE — Progress Notes (Signed)
 Patient Name: [name] Date of Encounter: 03/25/2016  Cardiologist:  Dr. James Hochrein  TAVR: Dr. Michael Cooper   Hospital Problem List     Active Problems:   Unstable angina (HCC)     Subjective   No pain Given anemia I asked her about BM.  Had "black" darker BM yesterday No nausea, vomiting   Inpatient Medications    . amLODipine  2.5 mg Oral Daily  . aspirin EC  81 mg Oral Daily  . atorvastatin  80 mg Oral Daily  . isosorbide mononitrate  30 mg Oral Daily  . pantoprazole  40 mg Oral Daily  . sodium chloride flush  3 mL Intravenous Q12H  . timolol  1 drop Both Eyes BID   Scheduled Meds: . amLODipine  2.5 mg Oral Daily  . aspirin EC  81 mg Oral Daily  . atorvastatin  80 mg Oral Daily  . isosorbide mononitrate  30 mg Oral Daily  . pantoprazole  40 mg Oral Daily  . sodium chloride flush  3 mL Intravenous Q12H  . timolol  1 drop Both Eyes BID   Continuous Infusions: . sodium chloride 75 mL/hr at 03/23/16 1439   PRN Meds:.sodium chloride, acetaminophen, cyclobenzaprine, HYDROcodone-acetaminophen, nitroGLYCERIN, ondansetron (ZOFRAN) IV, sodium chloride flush, zolpidem  Vital Signs    Vitals:   03/24/16 1238 03/24/16 1801 03/24/16 2226 03/25/16 0500  BP: 136/64 127/62 (!) 142/71 (!) 142/56  Pulse: 68 74 81 78  Resp: 12  (!) 22 (!) 22  Temp:  97.6 F (36.4 C) 98.1 F (36.7 C) 98.4 F (36.9 C)  TempSrc:  Oral Oral Oral  SpO2: 99% 100% 98% 99%  Weight:    71.7 kg (158 lb)  Height:        Intake/Output Summary (Last 24 hours) at 03/25/16 0803 Last data filed at 03/25/16 0319  Gross per 24 hour  Intake          2443.75 ml  Output             2400 ml  Net            43.75 ml   Filed Weights   03/23/16 0538 03/24/16 0500 03/25/16 0500  Weight: 70.4 kg (155 lb 3.2 oz) 71.7 kg (158 lb) 71.7 kg (158 lb)    Physical Exam    GEN: Well nourished, Elderly well developed, in no acute distress.  HEENT: Grossly normal.  Neck: Supple, no JVD, carotid  bruits, or masses. Cardiac: RRR, 2/6 systolic right upper sternal border murmur, no rubs, or gallops. No clubbing, cyanosis, edema.  Radials/DP/PT 2+ and equal bilaterally.  Respiratory:  Respirations regular and unlabored, clear to auscultation bilaterally. GI: Soft, nontender, nondistended, BS + x 4. MS: no deformity or atrophy. Skin: warm and dry, no rash. Neuro:  Strength and sensation are intact. Psych: AAOx3.  Normal affect.  Labs    CBC  Recent Labs  03/24/16 0432 03/24/16 0811 03/25/16 0323  WBC 8.9  --  8.3  HGB 7.9* 8.0* 7.7*  HCT 25.0* 25.5* 24.6*  MCV 82.5  --  84.0  PLT 153  --  153   Basic Metabolic Panel No results for input(s): NA, K, CL, CO2, GLUCOSE, BUN, CREATININE, CALCIUM, MG, PHOS in the last 72 hours. Liver Function Tests No results for input(s): AST, ALT, ALKPHOS, BILITOT, PROT, ALBUMIN in the last 72 hours. No results for input(s): LIPASE, AMYLASE in the last 72 hours. Cardiac Enzymes No results for input(s): CKTOTAL,   CKMB, CKMBINDEX, TROPONINI in the last 72 hours.   Telemetry    No adverse arrhythmias personally viewed  ECG    Sinus rhythm left bundle branch block 68 personally viewed-no change  Radiology    No results found. Echo 09/21/15 Mod LVH, EF 60-65%, no RWMA, Gr 1 DD, s/p TAVR, higher than expected typical gradients, mean 21 mmHg, peak 37 mmHg, trivial MR, PASP 34 mmHg  Carotid US 12/16 R ICA 40-59%; LICA <40%  LHC 12/15 Left mainstem:The left main stem is ectatic. There is heavy calcification without obstructive disease. Left anterior descending (LAD):The LAD is severely calcified. The vessel has 90% proximal stenosis. The first diagonal is seen to fill faintly. The mid and distal LAD fill from the mammary graft. Left circumflex (LCx):The left circumflex appears to have severe ostial stenosis. This is estimated at 80%. The mid circumflex is totally occluded. The obtuse marginal branches fill entirely from a sequential  saphenous vein graft. Right coronary artery (RCA):Severe calcification with total occlusion proximally. LIMA to LAD: Widely patent to the mid LAD. The mid and distal LAD are small in caliber. The proximal LAD fills retrograde from the graft and this fills the diagonal branch. Saphenous vein graft sequential to OM1 and OM 2: Widely patent with graft ectasia noted. There are diffuse irregularities. There are no significant stenoses. Multiple OM subbranch is fill from this bypass graft. Saphenous vein graft to distal RCA: Patent throughout. No significant stenosis identified. The native RCA fills retrograde from the graft. The PDA and PLA branches fill from the graft. Left ventriculography: Ventriculography was deferred because of kidney disease. Plain fluoroscopy demonstrates severe calcification and restricted mobility of prosthetic aortic valve leaflets. Final Conclusions:  1. Severe native three-vessel coronary artery disease 2. Status post multivessel CABG with continued patency of the LIMA to LAD, sequential saphenous vein graft to OM1 and OM 2, and saphenous vein graft to distal RCA 3. Severe bioprosthetic aortic valve stenosis  Patient Profile     82-year-old female with bioprosthetic aortic valve 2009 with subsequent valve and valve TAVR in 09/2014 with coronary disease status post CABG in 2009, hypertension, hyperlipidemia, chronic kidney disease creatinine 1.3 here with chest pain/unstable angina  Assessment & Plan    Unstable angina - enzymes negative no acute ECG changes Grafts LIMA to LAD, SVG RCA and Sequential SVG to OM1/OM2 patent pre TAVR cath 07/03/14.  Heparin stopped due to anemia Will try to proceed with cath today as EGD was benign Transfuse one unit to have a cushion  For HCt.   Chronic kidney disease  - Creatinine 1.3-IV fluids  - Hold HCTZ prior to cardiac catheterization  Aortic valve dysfunction  - Mean 21 mmHg, valve in valve AVR + TAVR  Carotid artery  disease  - Moderate plaque bilaterally  - Secondary prevention  Anemia:  Hct has dropped from 40.9 to 25  Transfuse one unit. EGD yesterday benign  Start FeSo4.  Will need to define coronary anatomy before any colonoscopy    Signed, Peter Nishan, MD  03/25/2016, 8:03 AM   

## 2016-03-25 NOTE — Anesthesia Postprocedure Evaluation (Signed)
Anesthesia Post Note  Patient: [name]  Procedure(s) Performed: Procedure(s) (LRB): ESOPHAGOGASTRODUODENOSCOPY (EGD) WITH PROPOFOL (N/A)  Patient location during evaluation: PACU Anesthesia Type: MAC Level of consciousness: awake and alert Pain management: pain level controlled Vital Signs Assessment: post-procedure vital signs reviewed and stable Respiratory status: spontaneous breathing, nonlabored ventilation and respiratory function stable Cardiovascular status: stable and blood pressure returned to baseline Anesthetic complications: no    Last Vitals:  Vitals:   03/25/16 0838 03/25/16 0906  BP: (!) 150/65 107/65  Pulse: 71 74  Resp:    Temp: 36.6 C 36.8 C    Last Pain:  Vitals:   03/25/16 0906  TempSrc: Oral  PainSc:                  Cartrell Bentsen A

## 2016-03-25 NOTE — H&P (View-Only) (Signed)
Patient Name: [name] Date of Encounter: 03/25/2016  Cardiologist:  Dr. Rollene RotundaJames Hochrein  TAVR: Dr. Lynnae JanuaryMichael Cooper   Hospital Problem List     Active Problems:   Unstable angina [facility](HCC)     Subjective   No pain Given anemia I asked her about BM.  Had "black" darker BM yesterday No nausea, vomiting   Inpatient Medications    . amLODipine  2.5 mg Oral Daily  . aspirin EC  81 mg Oral Daily  . atorvastatin  80 mg Oral Daily  . isosorbide mononitrate  30 mg Oral Daily  . pantoprazole  40 mg Oral Daily  . sodium chloride flush  3 mL Intravenous Q12H  . timolol  1 drop Both Eyes BID   Scheduled Meds: . amLODipine  2.5 mg Oral Daily  . aspirin EC  81 mg Oral Daily  . atorvastatin  80 mg Oral Daily  . isosorbide mononitrate  30 mg Oral Daily  . pantoprazole  40 mg Oral Daily  . sodium chloride flush  3 mL Intravenous Q12H  . timolol  1 drop Both Eyes BID   Continuous Infusions: . sodium chloride 75 mL/hr at 03/23/16 1439   PRN Meds:.sodium chloride, acetaminophen, cyclobenzaprine, HYDROcodone-acetaminophen, nitroGLYCERIN, ondansetron (ZOFRAN) IV, sodium chloride flush, zolpidem  Vital Signs    Vitals:   03/24/16 1238 03/24/16 1801 03/24/16 2226 03/25/16 0500  BP: 136/64 127/62 (!) 142/71 (!) 142/56  Pulse: 68 74 81 78  Resp: 12  (!) 22 (!) 22  Temp:  97.6 F (36.4 C) 98.1 F (36.7 C) 98.4 F (36.9 C)  TempSrc:  Oral Oral Oral  SpO2: 99% 100% 98% 99%  Weight:    71.7 kg (158 lb)  Height:        Intake/Output Summary (Last 24 hours) at 03/25/16 0803 Last data filed at 03/25/16 0319  Gross per 24 hour  Intake          2443.75 ml  Output             2400 ml  Net            43.75 ml   Filed Weights   03/23/16 0538 03/24/16 0500 03/25/16 0500  Weight: 70.4 kg (155 lb 3.2 oz) 71.7 kg (158 lb) 71.7 kg (158 lb)    Physical Exam    GEN: Well nourished, Elderly well developed, in no acute distress.  HEENT: Grossly normal.  Neck: Supple, no JVD, carotid  bruits, or masses. Cardiac: RRR, 2/6 systolic right upper sternal border murmur, no rubs, or gallops. No clubbing, cyanosis, edema.  Radials/DP/PT 2+ and equal bilaterally.  Respiratory:  Respirations regular and unlabored, clear to auscultation bilaterally. GI: Soft, nontender, nondistended, BS + x 4. MS: no deformity or atrophy. Skin: warm and dry, no rash. Neuro:  Strength and sensation are intact. Psych: AAOx3.  Normal affect.  Labs    CBC  Recent Labs  03/24/16 0432 03/24/16 0811 03/25/16 0323  WBC 8.9  --  8.3  HGB 7.9* 8.0* 7.7*  HCT 25.0* 25.5* 24.6*  MCV 82.5  --  84.0  PLT 153  --  153   Basic Metabolic Panel No results for input(s): NA, K, CL, CO2, GLUCOSE, BUN, CREATININE, CALCIUM, MG, PHOS in the last 72 hours. Liver Function Tests No results for input(s): AST, ALT, ALKPHOS, BILITOT, PROT, ALBUMIN in the last 72 hours. No results for input(s): LIPASE, AMYLASE in the last 72 hours. Cardiac Enzymes No results for input(s): CKTOTAL,  CKMB, CKMBINDEX, TROPONINI in the last 72 hours.   Telemetry    No adverse arrhythmias personally viewed  ECG    Sinus rhythm left bundle branch block 68 personally viewed-no change  Radiology    No results found. Echo 09/21/15 Mod LVH, EF 60-65%, no RWMA, Gr 1 DD, s/p TAVR, higher than expected typical gradients, mean 21 mmHg, peak 37 mmHg, trivial MR, PASP 34 mmHg  Carotid US 12/16 R ICA 40-59%; LICA <40%  LHC 12/15 Left mainstem:The left main stem is ectatic. There is heavy calcification without obstructive disease. Left anterior descending (LAD):The LAD is severely calcified. The vessel has 90% proximal stenosis. The first diagonal is seen to fill faintly. The mid and distal LAD fill from the mammary graft. Left circumflex (LCx):The left circumflex appears to have severe ostial stenosis. This is estimated at 80%. The mid circumflex is totally occluded. The obtuse marginal branches fill entirely from a sequential  saphenous vein graft. Right coronary artery (RCA):Severe calcification with total occlusion proximally. LIMA to LAD: Widely patent to the mid LAD. The mid and distal LAD are small in caliber. The proximal LAD fills retrograde from the graft and this fills the diagonal branch. Saphenous vein graft sequential to OM1 and OM 2: Widely patent with graft ectasia noted. There are diffuse irregularities. There are no significant stenoses. Multiple OM subbranch is fill from this bypass graft. Saphenous vein graft to distal RCA: Patent throughout. No significant stenosis identified. The native RCA fills retrograde from the graft. The PDA and PLA branches fill from the graft. Left ventriculography: Ventriculography was deferred because of kidney disease. Plain fluoroscopy demonstrates severe calcification and restricted mobility of prosthetic aortic valve leaflets. Final Conclusions:  1. Severe native three-vessel coronary artery disease 2. Status post multivessel CABG with continued patency of the LIMA to LAD, sequential saphenous vein graft to OM1 and OM 2, and saphenous vein graft to distal RCA 3. Severe bioprosthetic aortic valve stenosis  Patient Profile     82 year old female with bioprosthetic aortic valve 2009 with subsequent valve and valve TAVR in 09/2014 with coronary disease status post CABG in 2009, hypertension, hyperlipidemia, chronic kidney disease creatinine 1.3 here with chest pain/unstable angina  Assessment & Plan    Unstable angina - enzymes negative no acute ECG changes Grafts LIMA to LAD, SVG RCA and Sequential SVG to OM1/OM2 patent pre TAVR cath 07/03/14.  Heparin stopped due to anemia Will try to proceed with cath today as EGD was benign Transfuse one unit to have a cushion  For HCt.   Chronic kidney disease  - Creatinine 1.3-IV fluids  - Hold HCTZ prior to cardiac catheterization  Aortic valve dysfunction  - Mean 21 mmHg, valve in valve AVR + TAVR  Carotid artery  disease  - Moderate plaque bilaterally  - Secondary prevention  Anemia:  Hct has dropped from 40.9 to 25  Transfuse one unit. EGD yesterday benign  Start FeSo4.  Will need to define coronary anatomy before any colonoscopy    Signed, Charlton Haws, MD  03/25/2016, 8:03 AM

## 2016-03-25 NOTE — Interval H&P Note (Signed)
History and Physical Interval Note:  03/25/2016 3:42 PM  [name]  has presented today for surgery, with the diagnosis of unstable angina  The various methods of treatment have been discussed with the patient and family. After consideration of risks, benefits and other options for treatment, the patient has consented to  Procedure(s): Left Heart Cath and Coronary Angiography (N/A) and possible angioplasty Cath Lab Visit (complete for each Cath Lab visit)  Clinical Evaluation Leading to the Procedure:   ACS: Yes.    Non-ACS:    Anginal Classification: CCS IV  Anti-ischemic medical therapy: Maximal Therapy (2 or more classes of medications)  Non-Invasive Test Results: No non-invasive testing performed  Prior CABG: Previous CABG      as a surgical intervention .  The patient's history has been reviewed, patient examined, no change in status, stable for surgery.  I have reviewed the patient's chart and labs.  Questions were answered to the patient's satisfaction.     [name], [name]

## 2016-03-26 ENCOUNTER — Encounter (HOSPITAL_COMMUNITY): Payer: Self-pay | Admitting: Internal Medicine

## 2016-03-26 DIAGNOSIS — R931 Abnormal findings on diagnostic imaging of heart and coronary circulation: Secondary | ICD-10-CM

## 2016-03-26 LAB — BASIC METABOLIC PANEL
Anion gap: 6 (ref 5–15)
BUN: 13 mg/dL (ref 6–20)
CO2: 21 mmol/L — ABNORMAL LOW (ref 22–32)
CREATININE: 1.18 mg/dL — AB (ref 0.44–1.00)
Calcium: 9.2 mg/dL (ref 8.9–10.3)
Chloride: 112 mmol/L — ABNORMAL HIGH (ref 101–111)
GFR calc Af Amer: 49 mL/min — ABNORMAL LOW (ref >60)
GFR, EST NON AFRICAN AMERICAN: 42 mL/min — AB (ref >60)
GLUCOSE: 99 mg/dL (ref 65–99)
POTASSIUM: 3.9 mmol/L (ref 3.5–5.1)
Sodium: 139 mmol/L (ref 135–145)

## 2016-03-26 LAB — CBC
HEMATOCRIT: 30.8 % — AB (ref 36.0–46.0)
Hemoglobin: 9.6 g/dL — ABNORMAL LOW (ref 12.0–15.0)
MCH: 26.4 pg (ref 26.0–34.0)
MCHC: 31.2 g/dL (ref 30.0–36.0)
MCV: 84.8 fL (ref 78.0–100.0)
PLATELETS: 157 10*3/uL (ref 150–400)
RBC: 3.63 MIL/uL — ABNORMAL LOW (ref 3.87–5.11)
RDW: 15.2 % (ref 11.5–15.5)
WBC: 9.5 10*3/uL (ref 4.0–10.5)

## 2016-03-26 MED ORDER — PANTOPRAZOLE SODIUM 40 MG PO TBEC: 40.0000 mg | DELAYED_RELEASE_TABLET | Freq: Every day | ORAL | 3 refills | Status: DC

## 2016-03-26 MED ORDER — ISOSORBIDE MONONITRATE ER 30 MG PO TB24: 30.0000 mg | ORAL_TABLET | Freq: Every day | ORAL | 6 refills | Status: DC

## 2016-03-26 MED ORDER — NITROGLYCERIN 0.4 MG SL SUBL: 0.4000 mg | SUBLINGUAL_TABLET | SUBLINGUAL | 12 refills | Status: DC | PRN

## 2016-03-26 MED ORDER — LOPERAMIDE HCL 2 MG PO CAPS
4.0000 mg | ORAL_CAPSULE | Freq: Once | ORAL | Status: AC
  Administered 2016-03-26: 4 mg via ORAL

## 2016-03-26 NOTE — Progress Notes (Signed)
Patient Name: Angela Katherine BassetE Maxham Date of Encounter: 03/26/2016  Cardiologist:  Dr. Rollene RotundaJames Hochrein  TAVR: Dr. Lynnae JanuaryMichael Cooper   Hospital Problem List     Active Problems:   Unstable angina [facility](HCC)   Abnormal nuclear cardiac imaging test     Subjective   No pain Given anemia I asked her about BM.  Had "black" darker BM yesterday No nausea, vomiting   Inpatient Medications    . amLODipine  2.5 mg Oral Daily  . aspirin EC  81 mg Oral Daily  . atorvastatin  80 mg Oral Daily  . clopidogrel  75 mg Oral Q breakfast  . isosorbide mononitrate  30 mg Oral Daily  . pantoprazole  40 mg Oral Daily  . sodium chloride flush  3 mL Intravenous Q12H  . sodium chloride flush  3 mL Intravenous Q12H  . sodium chloride flush  3 mL Intravenous Q12H  . timolol  1 drop Both Eyes BID   Scheduled Meds: . amLODipine  2.5 mg Oral Daily  . aspirin EC  81 mg Oral Daily  . atorvastatin  80 mg Oral Daily  . clopidogrel  75 mg Oral Q breakfast  . isosorbide mononitrate  30 mg Oral Daily  . pantoprazole  40 mg Oral Daily  . sodium chloride flush  3 mL Intravenous Q12H  . sodium chloride flush  3 mL Intravenous Q12H  . sodium chloride flush  3 mL Intravenous Q12H  . timolol  1 drop Both Eyes BID   Continuous Infusions: . sodium chloride 75 mL/hr at 03/23/16 1439   PRN Meds:.sodium chloride, sodium chloride, sodium chloride, acetaminophen, cyclobenzaprine, HYDROcodone-acetaminophen, loperamide, nitroGLYCERIN, ondansetron (ZOFRAN) IV, sodium chloride flush, sodium chloride flush, sodium chloride flush, zolpidem  Vital Signs    Vitals:   03/25/16 1750 03/25/16 1820 03/25/16 2040 03/26/16 0437  BP: 126/72 130/72 (!) 129/57 116/60  Pulse:   76 70  Resp: (!) 21 16 19 15   Temp:   98.3 F (36.8 C) 98.4 F (36.9 C)  TempSrc:   Oral Oral  SpO2:   96% 97%  Weight:    70.7 kg (155 lb 12.8 oz)  Height:        Intake/Output Summary (Last 24 hours) at 03/26/16 0820 Last data filed at 03/26/16 0650  Gross  per 24 hour  Intake          1913.92 ml  Output              801 ml  Net          1112.92 ml   Filed Weights   03/24/16 0500 03/25/16 0500 03/26/16 0437  Weight: 71.7 kg (158 lb) 71.7 kg (158 lb) 70.7 kg (155 lb 12.8 oz)    Physical Exam    GEN: Well nourished, Elderly well developed, in no acute distress.  HEENT: Grossly normal.  Neck: Supple, no JVD, carotid bruits, or masses. Cardiac: RRR, 2/6 systolic right upper sternal border murmur, no rubs, or gallops. No clubbing, cyanosis, edema.  Radials/DP/PT 2+ and equal bilaterally.  Respiratory:  Respirations regular and unlabored, clear to auscultation bilaterally. GI: Soft, nontender, nondistended, BS + x 4. MS: no deformity or atrophy. Skin: warm and dry, no rash. Neuro:  Strength and sensation are intact. Psych: AAOx3.  Normal affect.  Labs    CBC  Recent Labs  03/25/16 0323 03/25/16 1857 03/26/16 0512  WBC 8.3  --  9.5  HGB 7.7* 9.5* 9.6*  HCT 24.6* 30.4* 30.8*  MCV  84.0  --  84.8  PLT 153  --  157   Basic Metabolic Panel  Recent Labs  03/26/16 0512  NA 139  K 3.9  CL 112*  CO2 21*  GLUCOSE 99  BUN 13  CREATININE 1.18*  CALCIUM 9.2   Liver Function Tests No results for input(s): AST, ALT, ALKPHOS, BILITOT, PROT, ALBUMIN in the last 72 hours. No results for input(s): LIPASE, AMYLASE in the last 72 hours. Cardiac Enzymes No results for input(s): CKTOTAL, CKMB, CKMBINDEX, TROPONINI in the last 72 hours.   Telemetry    No adverse arrhythmias personally viewed  ECG    Sinus rhythm left bundle branch block 68 personally viewed-no change  Radiology    No results found. Echo 09/21/15 Mod LVH, EF 60-65%, no RWMA, Gr 1 DD, s/p TAVR, higher than expected typical gradients, mean 21 mmHg, peak 37 mmHg, trivial MR, PASP 34 mmHg  Carotid US 12/16 R ICA 40-59%; LICA <40%  LHC 12/15 Left mainstem:The left main stem is ectatic. There is heavy calcification without obstructive disease. Left anterior  descending (LAD):The LAD is severely calcified. The vessel has 90% proximal stenosis. The first diagonal is seen to fill faintly. The mid and distal LAD fill from the mammary graft. Left circumflex (LCx):The left circumflex appears to have severe ostial stenosis. This is estimated at 80%. The mid circumflex is totally occluded. The obtuse marginal branches fill entirely from a sequential saphenous vein graft. Right coronary artery (RCA):Severe calcification with total occlusion proximally. LIMA to LAD: Widely patent to the mid LAD. The mid and distal LAD are small in caliber. The proximal LAD fills retrograde from the graft and this fills the diagonal branch. Saphenous vein graft sequential to OM1 and OM 2: Widely patent with graft ectasia noted. There are diffuse irregularities. There are no significant stenoses. Multiple OM subbranch is fill from this bypass graft. Saphenous vein graft to distal RCA: Patent throughout. No significant stenosis identified. The native RCA fills retrograde from the graft. The PDA and PLA branches fill from the graft. Left ventriculography: Ventriculography was deferred because of kidney disease. Plain fluoroscopy demonstrates severe calcification and restricted mobility of prosthetic aortic valve leaflets. Final Conclusions:  1. Severe native three-vessel coronary artery disease 2. Status post multivessel CABG with continued patency of the LIMA to LAD, sequential saphenous vein graft to OM1 and OM 2, and saphenous vein graft to distal RCA 3. Severe bioprosthetic aortic valve stenosis  Patient Profile     82 year old female with bioprosthetic aortic valve 2009 with subsequent valve and valve TAVR in 09/2014 with coronary disease status post CABG in 2009, hypertension, hyperlipidemia, chronic kidney disease creatinine 1.3 here with chest pain/unstable angina  Assessment & Plan    Chest Pain:  Cath yesterday with patent grafts continue medical Rx   Chronic kidney  disease  - Creatinine stable post cath 1.19   Aortic valve dysfunction  - Mean 21 mmHg, valve in valve AVR + TAVR  Carotid artery disease  - Moderate plaque bilaterally  - Secondary prevention  Anemia:  Transfused one unit this admission EGD non bleeding gastric ulcer Seen by GI Ganem Hct stable this am GI indicated no need for inpatient colonoscopy Outpatient f/u primary follow Hct  DC home today    Signed, Charlton Haws, MD  03/26/2016, 8:20 AM

## 2016-03-26 NOTE — Progress Notes (Signed)
Pt had 8 loose stools since she started on her protonix. Dr. Eden EmmsNishan updated. immodium 2 tabs/capsules  Ordered and given .

## 2016-03-26 NOTE — Progress Notes (Signed)
Notified by CCMD pt had a run of NSVT. Pt was being assisted to BSC/asymptomatic VS Stable. Strip saved for review. Will continue to monitor. Dierdre HighmanHall, Susane Bey Marie, RN

## 2016-03-26 NOTE — Discharge Summary (Signed)
Discharge Summary    Patient ID: [name],  MRN: [id number], DOB/AGE: Jul 27, 1932 82 y.o.  Admit date: 03/21/2016 Discharge date: 03/26/2016  Primary Care Provider: Tarri Fuller Primary Cardiologist: Dr. Antoine Poche  TAVR: Dr. Tonny Bollman  Electrophysiologist:  n/a    Discharge Diagnoses    Principal Problem:   Unstable angina [facility]) Active Problems:   S/P aortic valve replacement with bioprosthetic valve   Dyslipidemia   Hypertension   CAD (coronary artery disease)   S/P CABG x 4   S/P TAVR (transcatheter aortic valve replacement)   CKD (chronic kidney disease) stage 3, GFR 30-59 ml/min   Allergies Allergies  Allergen Reactions  . Amoxicillin Other (See Comments)    Unknown; Patient tolerated Ancef in May and Zinacef in March 2017.  Marland Kitchen Zetia [Ezetimibe] Other (See Comments)    DIZZINESS     History of Present Illness     [name] is a 82 y.o. female with a hx of bioprosthetic AVR in 2009 and subsequent valve in valve TAVR for severe prosthetic valve disease in 3/16, CAD s/p CABG in 2009, HTN HL, and CKD who presented to [facility] on 03/21/16 with chest pain.   LHC in 2015 with patent grafts. Last seen by Dr. Excell Seltzer in 3/17. Gradients of her valve were acceptable considering valve in valve approach and small surgical prosthesis.  Patient called into the office on 03/21/16 for chest pain and was added on urgently for further evaluation.  She noted substernal chest pain and L arm pain with assoc tingling. The arm tingling had been constant x2 days and she was having pain while in the office. She was felt to have unstable angina and admitted to the hospital for further work up and management. Plans were for IV hydration, IV heparin and coronary angiography.     Hospital Course     Consultants: GI   1. CAD with chest pain-cath was postponed given dropping H/H and GI consulted. She underwent EGD which showed a non bleeding gastric ulcer and GI  recommended PPI therapy. She received 1U PRBCS and underwent cath on 03/25/16 which showed patent bypass grafts and continued medical therapy was recommended. Will discharge her on imdur 30mg  daily. Will discharge on only ASA given her stable CAD and bleeding ulcer.   2. S/p TAVR - Echo this admission with stable gradients.  Continue SBE prophylaxis.    3. Carotid artery disease - moderate plaque bilaterally. Continue ASA ,statin.  4. HTN - BP controlled.   5. HLD - Continue statin.  6. CKD - she received IV hydration prior to cath and HCTZ held. Creat stable today at 1.18. Will resume home diuretic   7. Anemia - hemoglobin decreased from 12.7--> 7.7. Patient admitted to black stools. She was transfused one unit this admission. GI consulted and EGD showed a non bleeding gastric ulcer. She was placed on a PPI. Her H/H has remained stable. GI indicated no need for inpatient colonoscopy. Outpatient f/u primary follow blood counts.     The patient has had an uncomplicated hospital course and is recovering well. The femoral catheter site is stable. She has been seen by Dr. Eden Emms today and deemed ready for discharge home. All follow-up appointments have been scheduled. Discharge medications are listed below.  _____________  Discharge Vitals Blood pressure 120/70, pulse 70, temperature 98.4 F (36.9 C), temperature source Oral, resp. rate 15, height 5' (1.524 m), weight 155 lb 12.8 oz (70.7 kg), SpO2 97 %.  Filed Weights   03/24/16 0500 03/25/16 0500 03/26/16 0437  Weight: 158 lb (71.7 kg) 158 lb (71.7 kg) 155 lb 12.8 oz (70.7 kg)    Labs & Radiologic Studies     CBC  Recent Labs  03/25/16 0323 03/25/16 1857 03/26/16 0512  WBC 8.3  --  9.5  HGB 7.7* 9.5* 9.6*  HCT 24.6* 30.4* 30.8*  MCV 84.0  --  84.8  PLT 153  --  157   Basic Metabolic Panel  Recent Labs  03/26/16 0512  NA 139  K 3.9  CL 112*  CO2 21*  GLUCOSE 99  BUN 13  CREATININE 1.18*  CALCIUM 9.2    X-ray  Chest Pa And Lateral  Result Date: 03/21/2016 CLINICAL DATA:  Chest pain and shortness of breath all day. EXAM: CHEST  2 VIEW COMPARISON:  09/06/2014 FINDINGS: Lungs are hyperexpanded. Hyperexpansion is consistent with emphysema. The lungs are clear wiithout focal pneumonia, edema, pneumothorax or pleural effusion. Cardiopericardial silhouette is at upper limits of normal for size. Patient is status post median sternotomy and cardiac valve replacement. The visualized bony structures of the thorax are intact. Telemetry leads overlie the chest. IMPRESSION: Emphysema without acute cardiopulmonary findings. Electronically Signed   By: Kennith CenterEric  Mansell M.D.   On: 03/21/2016 16:22     Diagnostic Studies/Procedures    Cath 03/25/16 Conclusion    LM lesion, 100 %stenosed.  Prox RCA lesion, 100 %stenosed.  SVG.  Seq SVG- OM1 and OM2 graft was visualized by angiography and is normal in caliber and anatomically normal.  LIMA and is normal in caliber.  The graft exhibits mild focal disease.   Findings:  1. Unable to cannulate the LM due to TAVR graft previously shown to have severe high grade LAD and LCX CAD 2. RCA known total occlusion' 3. LIMA to LAD widely patent 4. Sequential SVG to OM-1 & OM-2 widely patent 5. SVG to distal RCA widely patent  Plan:  1. Known severe 3-v CAD with stable revascularization with all grafts widely patent.  2. Continue medical therapy.     _____________  2D ECHO: 03/23/2016 LV EF: 65% -   70% Study Conclusions - Left ventricle: The cavity size was normal. There was severe   focal basal hypertrophy of the septum. Systolic function was   vigorous. The estimated ejection fraction was in the range of 65%   to 70%. Hyperdynamic contraction with increased intracavitary/   outflow gradient (4.1066m/s, peak gradient 67mmHg). Wall motion was   normal; there were no regional wall motion abnormalities. Doppler   parameters are consistent with abnormal left  ventricular   relaxation (grade 1 diastolic dysfunction). Doppler parameters   are consistent with high ventricular filling pressure. - Aortic valve: There was no regurgitation. Peak velocity (S): 343   cm/s. Mean gradient (S): 25 mm Hg. Valve area (VTI): 1.03 cm^2.   Valve area (Vmax): 1.07 cm^2. Valve area (Vmean): 1.08 cm^2. - Left atrium: The atrium was mildly dilated Impressions: - Compared to the prior study, there has been no significant   interval change.  Disposition   Pt is being discharged home today in good condition.  Follow-up Plans & Appointments    Follow-up Information    Barrett, Deneen HartsRhonda, PA-C. Go on 04/10/2016.   Specialties:  Cardiology, Radiology Why:  @ 10:30am Contact information: [address] [place] [address] [phone]        Tarri FullerESCAJEDA, RICHARD, MD .   Specialty:  Family Medicine Why:  Please  make an appointment as soon as possible so he can follow your blood counts.  Contact information: [address] [place] [address] [phone]            Discharge Medications     Medication List    TAKE these medications   amLODipine 2.5 MG tablet Commonly known as:  NORVASC Take 2.5 mg by mouth daily.   aspirin 81 MG tablet Take 81 mg by mouth daily.   atorvastatin 80 MG tablet Commonly known as:  LIPITOR Take 1 tablet (80 mg total) by mouth daily.   CALTRATE 600 PO Take 600 mg by mouth daily.   hydrochlorothiazide 25 MG tablet Commonly known as:  HYDRODIURIL Take 1 tablet (25 mg total) by mouth daily.   isosorbide mononitrate 30 MG 24 hr tablet Commonly known as:  IMDUR Take 1 tablet (30 mg total) by mouth daily. Start taking on:  03/27/2016   methylPREDNISolone 4 MG Tbpk tablet Commonly known as:  MEDROL DOSEPAK Take 4-24 mg by mouth daily.   nitroGLYCERIN 0.4 MG SL tablet Commonly known as:  NITROSTAT Place 1 tablet (0.4 mg total) under the tongue every 5 (five) minutes x 3 doses as needed for  chest pain.   pantoprazole 40 MG tablet Commonly known as:  PROTONIX Take 1 tablet (40 mg total) by mouth daily. Start taking on:  03/27/2016   timolol 0.5 % ophthalmic solution Commonly known as:  TIMOPTIC Place 1 drop into both eyes 2 (two) times daily.   Vitamin D (Ergocalciferol) 50000 units Caps capsule Commonly known as:  DRISDOL Take 50,000 Units by mouth every 7 (seven) days. On Tuesday   zolpidem 5 MG tablet Commonly known as:  AMBIEN Take 5 mg by mouth at bedtime as needed for sleep.         Outstanding Labs/Studies   CBC to be followed by PCP  Duration of Discharge Encounter   Greater than 30 minutes including physician time.  Signed, Cline Crock PA-C 03/26/2016, 12:06 PM

## 2016-03-26 NOTE — Progress Notes (Signed)
CARDIAC REHAB PHASE I   PRE:  Rate/Rhythm: NT  BP:  Sitting: 139/78        SaO2: 97 RA  MODE:  Ambulation: 450 ft   POST:  Rate/Rhythm: NT  BP:  Sitting: 155/65         SaO2: 99 RA  Pt ambulated 450 ft on RA, assist x1, steady gait, tolerated well with no complaints other than generalized weakness. Reviewed exercise guidelines, ntg, and heart healthy diet information. Pt verbalized understanding. Pt not a candidate for phase 2 cardiac rehab referral at this time, no qualifying diagnosis, pt also declines. Pt to edge of bed per pt request after walk, call bell within reach. Pt eager for discharge.   [id number] Joylene GrapesEmily C Lezette Kitts, RN, BSN 03/26/2016 12:20 PM

## 2016-03-26 NOTE — Discharge Instructions (Signed)

## 2016-03-26 NOTE — Progress Notes (Signed)
H and H stable. From a GI standpoint I think she can go home on a PPI.

## 2016-03-28 ENCOUNTER — Ambulatory Visit: Payer: Medicare HMO | Admitting: Cardiology

## 2016-03-28 LAB — TYPE AND SCREEN
ABO/RH(D): B POS
Antibody Screen: NEGATIVE
Unit division: 0
Unit division: 0

## 2016-04-09 DIAGNOSIS — W19XXXA Unspecified fall, initial encounter: Secondary | ICD-10-CM

## 2016-04-09 HISTORY — DX: Unspecified fall, initial encounter: W19.XXXA

## 2016-04-10 ENCOUNTER — Ambulatory Visit (INDEPENDENT_AMBULATORY_CARE_PROVIDER_SITE_OTHER): Payer: Medicare HMO | Admitting: Student

## 2016-04-10 ENCOUNTER — Encounter: Payer: Self-pay | Admitting: Physician Assistant

## 2016-04-10 VITALS — BP 128/72 | HR 74 | Ht 60.0 in | Wt 156.4 lb

## 2016-04-10 DIAGNOSIS — E785 Hyperlipidemia, unspecified: Secondary | ICD-10-CM

## 2016-04-10 DIAGNOSIS — Z952 Presence of prosthetic heart valve: Secondary | ICD-10-CM

## 2016-04-10 DIAGNOSIS — I2510 Atherosclerotic heart disease of native coronary artery without angina pectoris: Secondary | ICD-10-CM

## 2016-04-10 DIAGNOSIS — D649 Anemia, unspecified: Secondary | ICD-10-CM

## 2016-04-10 DIAGNOSIS — I350 Nonrheumatic aortic (valve) stenosis: Secondary | ICD-10-CM | POA: Diagnosis not present

## 2016-04-10 DIAGNOSIS — I10 Essential (primary) hypertension: Secondary | ICD-10-CM | POA: Diagnosis not present
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
## 2016-04-10 NOTE — Patient Instructions (Signed)

## 2016-04-16 ENCOUNTER — Emergency Department (HOSPITAL_COMMUNITY)
Admission: EM | Admit: 2016-04-16 | Discharge: 2016-04-16 | Disposition: A | Discharge status: Home / Self Care | Payer: Medicare HMO | Attending: Emergency Medicine | Admitting: Emergency Medicine

## 2016-04-16 ENCOUNTER — Encounter (HOSPITAL_COMMUNITY): Payer: Self-pay

## 2016-04-16 ENCOUNTER — Emergency Department (HOSPITAL_COMMUNITY): Payer: Medicare HMO

## 2016-04-16 ENCOUNTER — Emergency Department (HOSPITAL_COMMUNITY)
Admission: EM | Admit: 2016-04-16 | Discharge: 2016-04-16 | Disposition: A | Discharge status: Home / Self Care | Payer: Medicare HMO | Source: Home / Self Care | Attending: Emergency Medicine | Admitting: Emergency Medicine

## 2016-04-16 DIAGNOSIS — Z7982 Long term (current) use of aspirin: Secondary | ICD-10-CM

## 2016-04-16 DIAGNOSIS — Y9259 Other trade areas as the place of occurrence of the external cause: Secondary | ICD-10-CM | POA: Insufficient documentation

## 2016-04-16 DIAGNOSIS — S00531A Contusion of lip, initial encounter: Secondary | ICD-10-CM | POA: Insufficient documentation

## 2016-04-16 DIAGNOSIS — Y999 Unspecified external cause status: Secondary | ICD-10-CM | POA: Insufficient documentation

## 2016-04-16 DIAGNOSIS — Z79899 Other long term (current) drug therapy: Secondary | ICD-10-CM

## 2016-04-16 DIAGNOSIS — Y939 Activity, unspecified: Secondary | ICD-10-CM

## 2016-04-16 DIAGNOSIS — I129 Hypertensive chronic kidney disease with stage 1 through stage 4 chronic kidney disease, or unspecified chronic kidney disease: Secondary | ICD-10-CM | POA: Insufficient documentation

## 2016-04-16 DIAGNOSIS — S2231XA Fracture of one rib, right side, initial encounter for closed fracture: Secondary | ICD-10-CM | POA: Insufficient documentation

## 2016-04-16 DIAGNOSIS — S0083XA Contusion of other part of head, initial encounter: Secondary | ICD-10-CM

## 2016-04-16 DIAGNOSIS — W228XXA Striking against or struck by other objects, initial encounter: Secondary | ICD-10-CM | POA: Diagnosis not present

## 2016-04-16 DIAGNOSIS — W19XXXA Unspecified fall, initial encounter: Secondary | ICD-10-CM

## 2016-04-16 DIAGNOSIS — R42 Dizziness and giddiness: Secondary | ICD-10-CM | POA: Insufficient documentation

## 2016-04-16 DIAGNOSIS — I2510 Atherosclerotic heart disease of native coronary artery without angina pectoris: Secondary | ICD-10-CM

## 2016-04-16 DIAGNOSIS — N183 Chronic kidney disease, stage 3 (moderate): Secondary | ICD-10-CM | POA: Insufficient documentation

## 2016-04-16 DIAGNOSIS — Z951 Presence of aortocoronary bypass graft: Secondary | ICD-10-CM

## 2016-04-16 DIAGNOSIS — S0993XA Unspecified injury of face, initial encounter: Secondary | ICD-10-CM | POA: Diagnosis present

## 2016-04-16 DIAGNOSIS — R0781 Pleurodynia: Secondary | ICD-10-CM

## 2016-04-16 DIAGNOSIS — Y92410 Unspecified street and highway as the place of occurrence of the external cause: Secondary | ICD-10-CM | POA: Insufficient documentation

## 2016-04-16 MED ORDER — HYDROCODONE-ACETAMINOPHEN 5-325 MG PO TABS
2.0000 | ORAL_TABLET | Freq: Once | ORAL | Status: AC
  Administered 2016-04-16: 2 via ORAL
  Filled 2016-04-16: qty 2

## 2016-04-16 MED ORDER — HYDROCODONE-ACETAMINOPHEN 5-325 MG PO TABS: 1.0000 | ORAL_TABLET | ORAL | 0 refills | Status: DC | PRN

## 2016-04-16 MED ORDER — BENZONATATE 100 MG PO CAPS: 100.0000 mg | ORAL_CAPSULE | Freq: Three times a day (TID) | ORAL | 0 refills | Status: DC | PRN
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
## 2016-04-16 NOTE — ED Notes (Signed)
Papers reviewed and patient and husband verbalize understanding. Pt. sts that her pain is slightly decreased and is ready to go home. Lip is still swollen but they understand that that is normal and will spread and heal in a few weeks.
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
## 2016-04-16 NOTE — ED Notes (Signed)
Walked patient to the bathroom  
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
## 2016-06-17 ENCOUNTER — Encounter: Payer: Self-pay | Admitting: Family

## 2016-06-24 ENCOUNTER — Encounter: Payer: Self-pay | Admitting: Family

## 2016-06-24 ENCOUNTER — Ambulatory Visit (HOSPITAL_COMMUNITY)
Admission: RE | Admit: 2016-06-24 | Discharge: 2016-06-24 | Disposition: A | Discharge status: Home / Self Care | Payer: Medicare HMO | Source: Ambulatory Visit | Attending: Family | Admitting: Family

## 2016-06-24 ENCOUNTER — Ambulatory Visit (INDEPENDENT_AMBULATORY_CARE_PROVIDER_SITE_OTHER): Payer: Medicare HMO | Admitting: Family

## 2016-06-24 VITALS — BP 137/75 | HR 67 | Temp 97.5°F | Resp 18 | Ht 61.0 in | Wt 153.4 lb

## 2016-06-24 DIAGNOSIS — I6523 Occlusion and stenosis of bilateral carotid arteries: Secondary | ICD-10-CM

## 2016-06-24 NOTE — Patient Instructions (Signed)
Stroke Prevention Some medical conditions and behaviors are associated with an increased chance of having a stroke. You may prevent a stroke by making healthy choices and managing medical conditions. How can I reduce my risk of having a stroke?  Stay physically active. Get at least 30 minutes of activity on most or all days.  Do not smoke. It may also be helpful to avoid exposure to secondhand smoke.  Limit alcohol use. Moderate alcohol use is considered to be:  No more than 2 drinks per day for men.  No more than 1 drink per day for nonpregnant women.  Eat healthy foods. This involves:  Eating 5 or more servings of fruits and vegetables a day.  Making dietary changes that address high blood pressure (hypertension), high cholesterol, diabetes, or obesity.  Manage your cholesterol levels.  Making food choices that are high in fiber and low in saturated fat, trans fat, and cholesterol may control cholesterol levels.  Take any prescribed medicines to control cholesterol as directed by your health care provider.  Manage your diabetes.  Controlling your carbohydrate and sugar intake is recommended to manage diabetes.  Take any prescribed medicines to control diabetes as directed by your health care provider.  Control your hypertension.  Making food choices that are low in salt (sodium), saturated fat, trans fat, and cholesterol is recommended to manage hypertension.  Ask your health care provider if you need treatment to lower your blood pressure. Take any prescribed medicines to control hypertension as directed by your health care provider.  If you are 18-39 years of age, have your blood pressure checked every 3-5 years. If you are 40 years of age or older, have your blood pressure checked every year.  Maintain a healthy weight.  Reducing calorie intake and making food choices that are low in sodium, saturated fat, trans fat, and cholesterol are recommended to manage  weight.  Stop drug abuse.  Avoid taking birth control pills.  Talk to your health care provider about the risks of taking birth control pills if you are over 35 years old, smoke, get migraines, or have ever had a blood clot.  Get evaluated for sleep disorders (sleep apnea).  Talk to your health care provider about getting a sleep evaluation if you snore a lot or have excessive sleepiness.  Take medicines only as directed by your health care provider.  For some people, aspirin or blood thinners (anticoagulants) are helpful in reducing the risk of forming abnormal blood clots that can lead to stroke. If you have the irregular heart rhythm of atrial fibrillation, you should be on a blood thinner unless there is a good reason you cannot take them.  Understand all your medicine instructions.  Make sure that other conditions (such as anemia or atherosclerosis) are addressed. Get help right away if:  You have sudden weakness or numbness of the face, arm, or leg, especially on one side of the body.  Your face or eyelid droops to one side.  You have sudden confusion.  You have trouble speaking (aphasia) or understanding.  You have sudden trouble seeing in one or both eyes.  You have sudden trouble walking.  You have dizziness.  You have a loss of balance or coordination.  You have a sudden, severe headache with no known cause.  You have new chest pain or an irregular heartbeat. Any of these symptoms may represent a serious problem that is an emergency. Do not wait to see if the symptoms will go away.   Get medical help at once. Call your local emergency services (911 in [place] Do not drive yourself to the hospital. This information is not intended to replace advice given to you by your health care provider. Make sure you discuss any questions you have with your health care provider. Document Released: 07/31/2004 Document Revised: 11/29/2015 Document Reviewed: 12/24/2012 Elsevier  Interactive Patient Education  2017 [employer]  
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
## 2016-06-24 NOTE — Addendum Note (Signed)
Addended by: Burton ApleyPETTY, Neilson Oehlert A on: 06/24/2016 03:18 PM   Modules accepted: Orders

## 2016-10-13 ENCOUNTER — Ambulatory Visit: Payer: Medicare HMO | Admitting: Cardiology
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
## 2016-11-03 ENCOUNTER — Ambulatory Visit (INDEPENDENT_AMBULATORY_CARE_PROVIDER_SITE_OTHER): Payer: Medicare HMO | Admitting: Cardiology

## 2016-11-03 ENCOUNTER — Encounter: Payer: Self-pay | Admitting: Cardiology

## 2016-11-03 VITALS — BP 138/79 | HR 63 | Ht 61.0 in | Wt 154.6 lb

## 2016-11-03 DIAGNOSIS — I25119 Atherosclerotic heart disease of native coronary artery with unspecified angina pectoris: Secondary | ICD-10-CM

## 2016-11-03 DIAGNOSIS — R55 Syncope and collapse: Secondary | ICD-10-CM | POA: Diagnosis not present

## 2016-11-03 DIAGNOSIS — Z952 Presence of prosthetic heart valve: Secondary | ICD-10-CM

## 2016-11-03 NOTE — Patient Instructions (Signed)
Medication Instructions: No changes  Procedures/Testing: Your physician has requested that you have an echocardiogram. Echocardiography is a painless test that uses sound waves to create images of your heart. It provides your doctor with information about the size and shape of your heart and how well your heart's chambers and valves are working. This procedure takes approximately one hour. There are no restrictions for this procedure. This will take place at [address], [address]   Follow-Up: Your physician wants you to follow-up in: 6 MONTHS WITH DR. HOCHREIN You will receive a reminder letter in the mail two months in advance. If you don't receive a letter, please call our office to schedule the follow-up appointment.    If you need a refill on your cardiac medications before your next appointment, please call your pharmacy.

## 2017-04-03 ENCOUNTER — Other Ambulatory Visit: Payer: Self-pay

## 2017-04-03 ENCOUNTER — Ambulatory Visit (HOSPITAL_COMMUNITY): Payer: Medicare HMO | Attending: Cardiovascular Disease

## 2017-04-03 DIAGNOSIS — E785 Hyperlipidemia, unspecified: Secondary | ICD-10-CM | POA: Insufficient documentation

## 2017-04-03 DIAGNOSIS — Z952 Presence of prosthetic heart valve: Secondary | ICD-10-CM

## 2017-04-03 DIAGNOSIS — I25119 Atherosclerotic heart disease of native coronary artery with unspecified angina pectoris: Secondary | ICD-10-CM | POA: Diagnosis not present

## 2017-04-03 DIAGNOSIS — N189 Chronic kidney disease, unspecified: Secondary | ICD-10-CM | POA: Diagnosis not present

## 2017-04-03 DIAGNOSIS — I447 Left bundle-branch block, unspecified: Secondary | ICD-10-CM | POA: Insufficient documentation

## 2017-04-03 DIAGNOSIS — I1310 Hypertensive heart and chronic kidney disease without heart failure, with stage 1 through stage 4 chronic kidney disease, or unspecified chronic kidney disease: Secondary | ICD-10-CM | POA: Diagnosis not present

## 2017-04-03 DIAGNOSIS — I071 Rheumatic tricuspid insufficiency: Secondary | ICD-10-CM | POA: Diagnosis not present

## 2017-04-29 ENCOUNTER — Ambulatory Visit: Payer: Medicare HMO | Admitting: Cardiology
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
## 2017-05-01 ENCOUNTER — Encounter: Payer: Self-pay | Admitting: Cardiology

## 2017-05-01 ENCOUNTER — Ambulatory Visit (INDEPENDENT_AMBULATORY_CARE_PROVIDER_SITE_OTHER): Payer: Medicare HMO | Admitting: Cardiology

## 2017-05-01 ENCOUNTER — Other Ambulatory Visit: Payer: Self-pay | Admitting: Physician Assistant

## 2017-05-01 VITALS — BP 144/75 | HR 65 | Ht 61.0 in | Wt 150.0 lb

## 2017-05-01 DIAGNOSIS — I2510 Atherosclerotic heart disease of native coronary artery without angina pectoris: Secondary | ICD-10-CM | POA: Diagnosis not present

## 2017-05-01 DIAGNOSIS — I10 Essential (primary) hypertension: Secondary | ICD-10-CM

## 2017-05-01 DIAGNOSIS — E785 Hyperlipidemia, unspecified: Secondary | ICD-10-CM | POA: Diagnosis not present

## 2017-05-01 DIAGNOSIS — Z952 Presence of prosthetic heart valve: Secondary | ICD-10-CM

## 2017-05-01 NOTE — Patient Instructions (Addendum)
Medication Instructions:  Continue current medications  If you need a refill on your cardiac medications before your next appointment, please call your pharmacy.  Labwork: None Ordered   Testing/Procedures: None Ordered  Follow-Up: Your physician wants you to follow-up in: 6 Months. You should receive a reminder letter in the mail two months in advance. If you do not receive a letter, please call our office    Thank you [facility] choosing [facility]!!

## 2017-11-18 ENCOUNTER — Encounter: Payer: Self-pay | Admitting: Cardiology
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
## 2017-11-20 ENCOUNTER — Encounter: Payer: Self-pay | Admitting: Cardiology

## 2017-11-20 ENCOUNTER — Ambulatory Visit: Payer: Medicare HMO | Admitting: Cardiology

## 2017-11-20 VITALS — BP 112/70 | HR 61 | Ht 61.0 in | Wt 149.0 lb

## 2017-11-20 DIAGNOSIS — Z953 Presence of xenogenic heart valve: Secondary | ICD-10-CM

## 2017-11-20 DIAGNOSIS — E785 Hyperlipidemia, unspecified: Secondary | ICD-10-CM

## 2017-11-20 DIAGNOSIS — I10 Essential (primary) hypertension: Secondary | ICD-10-CM | POA: Diagnosis not present

## 2017-11-20 DIAGNOSIS — I2510 Atherosclerotic heart disease of native coronary artery without angina pectoris: Secondary | ICD-10-CM

## 2017-11-20 DIAGNOSIS — R5383 Other fatigue: Secondary | ICD-10-CM

## 2017-11-20 DIAGNOSIS — Z79899 Other long term (current) drug therapy: Secondary | ICD-10-CM

## 2017-11-20 NOTE — Patient Instructions (Signed)
Medication Instructions:  Continue current medications  If you need a refill on your cardiac medications before your next appointment, please call your pharmacy.  Labwork: CBC, TSH, BMP and Fasting LipidsToday HERE IN OUR OFFICE AT [facility]  Take the provided lab slips with you to the lab for your blood draw.   You will need to fast. DO NOT EAT OR DRINK PAST MIDNIGHT.   Testing/Procedures: Your physician has requested that you have an echocardiogram in September. Echocardiography is a painless test that uses sound waves to create images of your heart. It provides your doctor with information about the size and shape of your heart and how well your heart's chambers and valves are working. This procedure takes approximately one hour. There are no restrictions for this procedure.   Follow-Up: Your physician wants you to follow-up in: 1 Year. You should receive a reminder letter in the mail two months in advance. If you do not receive a letter, please call our office [phone].      Thank you for choosing [facility] at [facility]!!

## 2017-11-21 LAB — CBC
HEMOGLOBIN: 11.2 g/dL (ref 11.1–15.9)
Hematocrit: 33.5 % — ABNORMAL LOW (ref 34.0–46.6)
MCH: 25.3 pg — ABNORMAL LOW (ref 26.6–33.0)
MCHC: 33.4 g/dL (ref 31.5–35.7)
MCV: 76 fL — AB (ref 79–97)
Platelets: 237 10*3/uL (ref 150–379)
RBC: 4.43 x10E6/uL (ref 3.77–5.28)
RDW: 16.5 % — ABNORMAL HIGH (ref 12.3–15.4)
WBC: 10.1 10*3/uL (ref 3.4–10.8)

## 2017-11-21 LAB — BASIC METABOLIC PANEL
BUN/Creatinine Ratio: 16 (ref 12–28)
BUN: 23 mg/dL (ref 8–27)
CALCIUM: 9.9 mg/dL (ref 8.7–10.3)
CO2: 19 mmol/L — AB (ref 20–29)
CREATININE: 1.45 mg/dL — AB (ref 0.57–1.00)
Chloride: 101 mmol/L (ref 96–106)
GFR, EST AFRICAN AMERICAN: 39 mL/min/{1.73_m2} — AB (ref >59)
GFR, EST NON AFRICAN AMERICAN: 34 mL/min/{1.73_m2} — AB (ref >59)
Glucose: 108 mg/dL — ABNORMAL HIGH (ref 65–99)
POTASSIUM: 3.9 mmol/L (ref 3.5–5.2)
Sodium: 137 mmol/L (ref 134–144)

## 2017-11-21 LAB — LIPID PANEL
Chol/HDL Ratio: 4 ratio (ref 0.0–4.4)
Cholesterol, Total: 169 mg/dL (ref 100–199)
HDL: 42 mg/dL (ref >39)
LDL Calculated: 102 mg/dL — ABNORMAL HIGH (ref 0–99)
TRIGLYCERIDES: 127 mg/dL (ref 0–149)
VLDL Cholesterol Cal: 25 mg/dL (ref 5–40)

## 2017-11-21 LAB — TSH: TSH: 5 u[IU]/mL — ABNORMAL HIGH (ref 0.450–4.500)

## 2017-11-24 ENCOUNTER — Telehealth: Payer: Self-pay | Admitting: *Deleted

## 2017-11-24 DIAGNOSIS — E785 Hyperlipidemia, unspecified: Secondary | ICD-10-CM

## 2017-11-24 MED ORDER — ROSUVASTATIN CALCIUM 40 MG PO TABS: 40.0000 mg | ORAL_TABLET | Freq: Every day | ORAL | 3 refills | Status: DC

## 2017-11-24 NOTE — Telephone Encounter (Signed)
Pt aware of her blood work, Crestor 40 mg send into pt pharmacy and Lipitor D/C from pt medications list, fasting lipid ordered and mail to pt to get done in 8 week

## 2017-11-24 NOTE — Telephone Encounter (Signed)
-----   Message from [name], MD sent at 11/22/2017  3:00 PM EDT ----- She is not quite at target for her LDL.  I would suggest stopping her Lipitor and starting Crestor 40 mg po daily.  Take one po daily, disp number 30 with 11 refills.  Repeat lipid and liver in 8 weeks.  Call Ms. Wilmouth with the results and send results to Oletha Blend, MD

## 2017-11-25 ENCOUNTER — Telehealth: Payer: Self-pay | Admitting: Cardiology

## 2017-11-25 NOTE — Telephone Encounter (Signed)
Returned call to pt and informed that she was previously taking liptor and Per Dr. Combs Lions he would like to switch her to Crestor 40 mg. Pt verbalized understanding.

## 2017-11-25 NOTE — Telephone Encounter (Signed)
Pt calling:  Stated she received a call from Dr [employer] nurse yesterday, that he was going to change her cholesterol medication to . Pt stated she already taking . Pt is confused and wish to speak to nurse concerning this matter. Please call pt.

## 2017-12-23 ENCOUNTER — Ambulatory Visit: Payer: Medicare HMO | Admitting: Family

## 2017-12-23 ENCOUNTER — Encounter (HOSPITAL_COMMUNITY): Payer: Medicare HMO

## 2017-12-24 ENCOUNTER — Ambulatory Visit: Payer: Medicare HMO | Admitting: Family

## 2017-12-24 ENCOUNTER — Encounter (HOSPITAL_COMMUNITY): Payer: Medicare HMO

## 2017-12-29 ENCOUNTER — Ambulatory Visit (HOSPITAL_COMMUNITY)
Admission: RE | Admit: 2017-12-29 | Discharge: 2017-12-29 | Disposition: A | Discharge status: Home / Self Care | Payer: Medicare HMO | Source: Ambulatory Visit | Attending: Family | Admitting: Family

## 2017-12-29 ENCOUNTER — Ambulatory Visit: Payer: Medicare HMO | Admitting: Family

## 2017-12-29 ENCOUNTER — Encounter: Payer: Self-pay | Admitting: Family

## 2017-12-29 VITALS — BP 131/78 | HR 75 | Temp 97.0°F | Resp 18 | Ht 62.0 in | Wt 145.0 lb

## 2017-12-29 DIAGNOSIS — I6523 Occlusion and stenosis of bilateral carotid arteries: Secondary | ICD-10-CM
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
## 2017-12-29 NOTE — Patient Instructions (Signed)

## 2018-01-14 LAB — HEPATIC FUNCTION PANEL
ALK PHOS: 82 IU/L (ref 39–117)
ALT: 25 IU/L (ref 0–32)
AST: 37 IU/L (ref 0–40)
Albumin: 4.5 g/dL (ref 3.5–4.7)
BILIRUBIN, DIRECT: 0.29 mg/dL (ref 0.00–0.40)
Bilirubin Total: 1.3 mg/dL — ABNORMAL HIGH (ref 0.0–1.2)
Total Protein: 7.2 g/dL (ref 6.0–8.5)

## 2018-01-14 LAB — LIPID PANEL
CHOLESTEROL TOTAL: 137 mg/dL (ref 100–199)
Chol/HDL Ratio: 3 ratio (ref 0.0–4.4)
HDL: 45 mg/dL (ref >39)
LDL Calculated: 65 mg/dL (ref 0–99)
TRIGLYCERIDES: 135 mg/dL (ref 0–149)
VLDL Cholesterol Cal: 27 mg/dL (ref 5–40)

## 2018-03-22 ENCOUNTER — Telehealth: Payer: Self-pay | Admitting: Cardiology

## 2018-03-22 NOTE — Telephone Encounter (Signed)
Returned call to patient left message on personal voice mail I will send message to Dr.Hochrein.

## 2018-03-22 NOTE — Telephone Encounter (Signed)
New message:       Pt c/o medication issue:  1. Name of Medication: rosuvastatin (CRESTOR) 40 MG tablet(Expired)  2. How are you currently taking this medication (dosage and times per day)? Take 1 tablet (40 mg total) by mouth daily.  3. Are you having a reaction (difficulty breathing--STAT)? No  4. What is your medication issue? Pt states she has little red spots all over her legs and her arms and that she is not going to take this medication anymore.

## 2018-03-22 NOTE — Telephone Encounter (Signed)
Noted  

## 2018-03-24 ENCOUNTER — Ambulatory Visit (HOSPITAL_COMMUNITY): Payer: Medicare HMO | Attending: Internal Medicine

## 2018-03-24 ENCOUNTER — Other Ambulatory Visit: Payer: Self-pay

## 2018-03-24 DIAGNOSIS — I2510 Atherosclerotic heart disease of native coronary artery without angina pectoris: Secondary | ICD-10-CM | POA: Diagnosis not present

## 2018-03-24 DIAGNOSIS — R079 Chest pain, unspecified: Secondary | ICD-10-CM | POA: Insufficient documentation

## 2018-03-24 DIAGNOSIS — I050 Rheumatic mitral stenosis: Secondary | ICD-10-CM | POA: Insufficient documentation

## 2018-03-24 DIAGNOSIS — Z953 Presence of xenogenic heart valve: Secondary | ICD-10-CM

## 2018-03-24 DIAGNOSIS — I359 Nonrheumatic aortic valve disorder, unspecified: Secondary | ICD-10-CM | POA: Diagnosis present

## 2018-04-05 NOTE — Telephone Encounter (Signed)
-----   Message from [name], MD sent at 03/27/2018 10:23 AM EDT ----- The bioprosthetic valve looks OK.  It is slightly stiff but working well.  The aorta is enlarged but we will keep an eye on this.  No change in therapy or further testing is indicated at this point.  Call Ms. Timme with the results and send results to Oletha Blend, MD

## 2018-04-05 NOTE — Telephone Encounter (Signed)
Spoke with pt about her Echo result, pt also stated that she has stop taking crestor and went back to to lipitor because crestor medication has cause break out of small red spots. Advised pt that Dr Antoine Poche was aware and to give [place] a call if she need any refills.

## 2018-08-05 ENCOUNTER — Other Ambulatory Visit: Payer: Self-pay | Admitting: Cardiology

## 2018-08-23 ENCOUNTER — Emergency Department (HOSPITAL_COMMUNITY): Payer: Medicare HMO

## 2018-08-23 ENCOUNTER — Encounter (HOSPITAL_COMMUNITY): Payer: Self-pay

## 2018-08-23 ENCOUNTER — Emergency Department (HOSPITAL_COMMUNITY)
Admission: EM | Admit: 2018-08-23 | Discharge: 2018-08-23 | Disposition: A | Discharge status: Home / Self Care | Payer: Medicare HMO | Attending: Emergency Medicine | Admitting: Emergency Medicine

## 2018-08-23 ENCOUNTER — Other Ambulatory Visit: Payer: Self-pay

## 2018-08-23 DIAGNOSIS — D72829 Elevated white blood cell count, unspecified: Secondary | ICD-10-CM | POA: Diagnosis not present

## 2018-08-23 DIAGNOSIS — R0602 Shortness of breath: Secondary | ICD-10-CM

## 2018-08-23 DIAGNOSIS — R059 Cough, unspecified: Secondary | ICD-10-CM

## 2018-08-23 DIAGNOSIS — Z79899 Other long term (current) drug therapy: Secondary | ICD-10-CM | POA: Insufficient documentation

## 2018-08-23 DIAGNOSIS — N183 Chronic kidney disease, stage 3 (moderate): Secondary | ICD-10-CM | POA: Insufficient documentation

## 2018-08-23 DIAGNOSIS — Z951 Presence of aortocoronary bypass graft: Secondary | ICD-10-CM | POA: Diagnosis not present

## 2018-08-23 DIAGNOSIS — I129 Hypertensive chronic kidney disease with stage 1 through stage 4 chronic kidney disease, or unspecified chronic kidney disease: Secondary | ICD-10-CM | POA: Insufficient documentation

## 2018-08-23 DIAGNOSIS — I2510 Atherosclerotic heart disease of native coronary artery without angina pectoris: Secondary | ICD-10-CM | POA: Diagnosis not present

## 2018-08-23 DIAGNOSIS — Z7982 Long term (current) use of aspirin: Secondary | ICD-10-CM | POA: Insufficient documentation

## 2018-08-23 DIAGNOSIS — R05 Cough: Secondary | ICD-10-CM

## 2018-08-23 LAB — URINALYSIS, ROUTINE W REFLEX MICROSCOPIC
Bacteria, UA: NONE SEEN
Bilirubin Urine: NEGATIVE
Glucose, UA: NEGATIVE mg/dL
Ketones, ur: NEGATIVE mg/dL
Nitrite: NEGATIVE
Protein, ur: NEGATIVE mg/dL
Specific Gravity, Urine: 1.018 (ref 1.005–1.030)
pH: 6 (ref 5.0–8.0)

## 2018-08-23 LAB — COMPREHENSIVE METABOLIC PANEL
ALK PHOS: 78 U/L (ref 38–126)
ALT: 19 U/L (ref 0–44)
AST: 19 U/L (ref 15–41)
Albumin: 3.6 g/dL (ref 3.5–5.0)
Anion gap: 16 — ABNORMAL HIGH (ref 5–15)
BUN: 28 mg/dL — ABNORMAL HIGH (ref 8–23)
CO2: 24 mmol/L (ref 22–32)
Calcium: 10.3 mg/dL (ref 8.9–10.3)
Chloride: 96 mmol/L — ABNORMAL LOW (ref 98–111)
Creatinine, Ser: 1.34 mg/dL — ABNORMAL HIGH (ref 0.44–1.00)
GFR calc Af Amer: 42 mL/min — ABNORMAL LOW (ref >60)
GFR calc non Af Amer: 37 mL/min — ABNORMAL LOW (ref >60)
Glucose, Bld: 108 mg/dL — ABNORMAL HIGH (ref 70–99)
Potassium: 3.8 mmol/L (ref 3.5–5.1)
Sodium: 136 mmol/L (ref 135–145)
Total Bilirubin: 1.3 mg/dL — ABNORMAL HIGH (ref 0.3–1.2)
Total Protein: 8.1 g/dL (ref 6.5–8.1)

## 2018-08-23 LAB — CBC WITH DIFFERENTIAL/PLATELET
Abs Immature Granulocytes: 0.37 10*3/uL — ABNORMAL HIGH (ref 0.00–0.07)
Basophils Absolute: 0 10*3/uL (ref 0.0–0.1)
Basophils Relative: 0 %
Eosinophils Absolute: 0 10*3/uL (ref 0.0–0.5)
Eosinophils Relative: 0 %
HCT: 40.6 % (ref 36.0–46.0)
Hemoglobin: 11.9 g/dL — ABNORMAL LOW (ref 12.0–15.0)
Immature Granulocytes: 1 %
LYMPHS ABS: 3.2 10*3/uL (ref 0.7–4.0)
Lymphocytes Relative: 12 %
MCH: 22.2 pg — ABNORMAL LOW (ref 26.0–34.0)
MCHC: 29.3 g/dL — AB (ref 30.0–36.0)
MCV: 75.7 fL — ABNORMAL LOW (ref 80.0–100.0)
Monocytes Absolute: 1 10*3/uL (ref 0.1–1.0)
Monocytes Relative: 4 %
Neutro Abs: 22.2 10*3/uL — ABNORMAL HIGH (ref 1.7–7.7)
Neutrophils Relative %: 83 %
Platelets: 370 10*3/uL (ref 150–400)
RBC: 5.36 MIL/uL — ABNORMAL HIGH (ref 3.87–5.11)
RDW: 15.5 % (ref 11.5–15.5)
WBC: 27.1 10*3/uL — ABNORMAL HIGH (ref 4.0–10.5)
nRBC: 0 % (ref 0.0–0.2)

## 2018-08-23 LAB — I-STAT TROPONIN, ED: Troponin i, poc: 0.02 ng/mL (ref 0.00–0.08)

## 2018-08-23 LAB — BRAIN NATRIURETIC PEPTIDE: B Natriuretic Peptide: 288.1 pg/mL — ABNORMAL HIGH (ref 0.0–100.0)

## 2018-08-23 MED ORDER — FUROSEMIDE 20 MG PO TABS
40.0000 mg | ORAL_TABLET | Freq: Once | ORAL | Status: AC
  Administered 2018-08-23: 40 mg via ORAL
  Filled 2018-08-23: qty 2

## 2018-08-23 MED ORDER — IOPAMIDOL (ISOVUE-370) INJECTION 76%
75.0000 mL | Freq: Once | INTRAVENOUS | Status: AC | PRN
  Administered 2018-08-23: 75 mL via INTRAVENOUS

## 2018-08-23 MED ORDER — IOPAMIDOL (ISOVUE-370) INJECTION 76%
INTRAVENOUS | Status: AC
  Filled 2018-08-23: qty 100

## 2018-08-23 MED ORDER — FUROSEMIDE 10 MG/ML IJ SOLN: 20.0000 mg | Freq: Once | INTRAMUSCULAR | Status: DC

## 2018-08-23 MED ORDER — ALBUTEROL SULFATE (2.5 MG/3ML) 0.083% IN NEBU: 5.0000 mg | INHALATION_SOLUTION | Freq: Once | RESPIRATORY_TRACT | Status: DC

## 2018-08-23 NOTE — ED Notes (Signed)
Pt returned from X-ray.  

## 2018-08-23 NOTE — ED Notes (Signed)
EKG completed

## 2018-08-23 NOTE — ED Notes (Signed)
Patient transported to X-ray 

## 2018-08-23 NOTE — ED Notes (Signed)
Pt verbalizes understanding of discharge instructions. Opportunity for questioning and answers were provided. Armband removed by staff, pt discharged from ED.  

## 2018-08-23 NOTE — ED Notes (Signed)
No signature pad available  Pt unable to sign

## 2018-08-23 NOTE — ED Triage Notes (Signed)
Pt arrives POV with husband from physician's office in [place] c/o shortness of breath. Per pt, MD told her to come to the ED due to fluid. Pt states she "thinks" she takes coumadin at home but is unsure. A&o x4 , VSS at this time.

## 2018-08-23 NOTE — ED Notes (Signed)
Pt taken to CT.
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
## 2018-08-25 LAB — URINE CULTURE: Culture: 30000 — AB

## 2018-08-26 ENCOUNTER — Telehealth: Payer: Self-pay | Admitting: *Deleted

## 2018-08-26 NOTE — Telephone Encounter (Signed)
Post ED Visit - Positive Culture Follow-up  Culture report reviewed by antimicrobial stewardship pharmacist:  []  Enzo Bi, Pharm.D. []  Celedonio Miyamoto, Pharm.D., BCPS AQ-ID []  Garvin Fila, Pharm.D., BCPS []  Georgina Pillion, [address].D., BCPS []  Anthony, [address].D., BCPS, AAHIVP []  Estella Husk, Pharm.D., BCPS, AAHIVP []  Lysle Pearl, PharmD, BCPS []  Phillips Climes, PharmD, BCPS [x]  Agapito Games, PharmD, BCPS []  Verlan Friends, PharmD  Positive urine culture Reviewed and no further patient follow-up is required at this time.  Virl Axe [facility] 08/26/2018, 9:20 AM

## 2018-08-27 ENCOUNTER — Ambulatory Visit: Payer: Medicare HMO | Admitting: Pulmonary Disease

## 2018-08-27 ENCOUNTER — Encounter: Payer: Self-pay | Admitting: Pulmonary Disease

## 2018-08-27 ENCOUNTER — Ambulatory Visit (INDEPENDENT_AMBULATORY_CARE_PROVIDER_SITE_OTHER)
Admission: RE | Admit: 2018-08-27 | Discharge: 2018-08-27 | Disposition: A | Discharge status: Home / Self Care | Payer: Medicare HMO | Source: Ambulatory Visit | Attending: Pulmonary Disease | Admitting: Pulmonary Disease

## 2018-08-27 VITALS — BP 132/74 | HR 97 | Ht 61.0 in | Wt 136.4 lb

## 2018-08-27 DIAGNOSIS — R05 Cough: Secondary | ICD-10-CM

## 2018-08-27 DIAGNOSIS — R059 Cough, unspecified: Secondary | ICD-10-CM

## 2018-08-27 DIAGNOSIS — N390 Urinary tract infection, site not specified: Secondary | ICD-10-CM | POA: Diagnosis not present

## 2018-08-27 LAB — NITRIC OXIDE: Nitric Oxide: 13

## 2018-08-27 MED ORDER — DOXYCYCLINE HYCLATE 100 MG PO TABS: 100.0000 mg | ORAL_TABLET | Freq: Two times a day (BID) | ORAL | 0 refills | Status: DC

## 2018-08-27 MED ORDER — PREDNISONE 10 MG PO TABS: ORAL_TABLET | ORAL | 0 refills | Status: DC

## 2018-08-27 NOTE — Patient Instructions (Addendum)
We will get a chest x-ray today Check some labs including CBC with differential, urine culture, metabolic panel We will prescribe doxycycline 100 mg twice daily for 7 days And prednisone taper starting at 40 mg.  Reduce dose by 10 mg every 3 days Continue using Delsym for cough You can also use Mucinex to help with the chest congestion  We will schedule you for bronchoscope and call you with the date and time of the procedure Follow-up in 2 to 4 weeks.

## 2018-08-27 NOTE — Addendum Note (Signed)
Addended by: Demetrio Lapping E on: 08/27/2018 04:53 PM   Modules accepted: Orders
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
## 2018-08-29 LAB — CBC WITH DIFFERENTIAL/PLATELET
Absolute Monocytes: 903 cells/uL (ref 200–950)
Basophils Absolute: 21 cells/uL (ref 0–200)
Basophils Relative: 0.1 %
Eosinophils Absolute: 147 cells/uL (ref 15–500)
Eosinophils Relative: 0.7 %
HCT: 33.9 % — ABNORMAL LOW (ref 35.0–45.0)
Hemoglobin: 10.7 g/dL — ABNORMAL LOW (ref 11.7–15.5)
Lymphs Abs: 2478 cells/uL (ref 850–3900)
MCH: 22.8 pg — AB (ref 27.0–33.0)
MCHC: 31.6 g/dL — ABNORMAL LOW (ref 32.0–36.0)
MCV: 72.1 fL — ABNORMAL LOW (ref 80.0–100.0)
MPV: 11.3 fL (ref 7.5–12.5)
Monocytes Relative: 4.3 %
Neutro Abs: 17451 cells/uL — ABNORMAL HIGH (ref 1500–7800)
Neutrophils Relative %: 83.1 %
Platelets: 445 10*3/uL — ABNORMAL HIGH (ref 140–400)
RBC: 4.7 10*6/uL (ref 3.80–5.10)
RDW: 14.9 % (ref 11.0–15.0)
Total Lymphocyte: 11.8 %
WBC: 21 10*3/uL — ABNORMAL HIGH (ref 3.8–10.8)

## 2018-08-29 LAB — URINE CULTURE
MICRO NUMBER:: 226780
SPECIMEN QUALITY:: ADEQUATE

## 2018-08-29 LAB — BASIC METABOLIC PANEL
BUN/Creatinine Ratio: 24 (calc) — ABNORMAL HIGH (ref 6–22)
BUN: 29 mg/dL — ABNORMAL HIGH (ref 7–25)
CO2: 25 mmol/L (ref 20–32)
Calcium: 9.8 mg/dL (ref 8.6–10.4)
Chloride: 98 mmol/L (ref 98–110)
Creat: 1.23 mg/dL — ABNORMAL HIGH (ref 0.60–0.88)
Glucose, Bld: 110 mg/dL — ABNORMAL HIGH (ref 65–99)
Potassium: 3.3 mmol/L — ABNORMAL LOW (ref 3.5–5.3)
SODIUM: 138 mmol/L (ref 135–146)

## 2018-08-29 LAB — URINALYSIS
Bilirubin Urine: NEGATIVE
Glucose, UA: NEGATIVE
Ketones, ur: NEGATIVE
NITRITE: NEGATIVE
Specific Gravity, Urine: 1.015 (ref 1.001–1.03)
pH: 6.5 (ref 5.0–8.0)

## 2018-08-30 ENCOUNTER — Telehealth: Payer: Self-pay | Admitting: Pulmonary Disease

## 2018-08-30 NOTE — Telephone Encounter (Signed)
Pt is calling back [phone] 

## 2018-08-30 NOTE — Telephone Encounter (Addendum)
I called to discuss the results of the Ucx which is growing E coli again.  This should be covered by doxycyline  Overall she is feeling better. Bronch is scheduled for 3/2  Chilton Greathouse MD [facility] 08/30/2018, 9:31 AM

## 2018-09-06 ENCOUNTER — Ambulatory Visit (INDEPENDENT_AMBULATORY_CARE_PROVIDER_SITE_OTHER)
Admission: RE | Admit: 2018-09-06 | Discharge: 2018-09-06 | Disposition: A | Discharge status: Home / Self Care | Payer: Medicare HMO | Source: Ambulatory Visit | Attending: Adult Health | Admitting: Adult Health

## 2018-09-06 ENCOUNTER — Encounter (HOSPITAL_COMMUNITY): Admission: RE | Payer: Self-pay | Source: Ambulatory Visit

## 2018-09-06 ENCOUNTER — Ambulatory Visit (HOSPITAL_COMMUNITY): Admission: RE | Admit: 2018-09-06 | Payer: Medicare HMO | Source: Ambulatory Visit | Admitting: Pulmonary Disease

## 2018-09-06 ENCOUNTER — Ambulatory Visit: Payer: Medicare HMO | Admitting: Adult Health

## 2018-09-06 ENCOUNTER — Telehealth: Payer: Self-pay | Admitting: Pulmonary Disease

## 2018-09-06 ENCOUNTER — Encounter: Payer: Self-pay | Admitting: Adult Health

## 2018-09-06 ENCOUNTER — Ambulatory Visit (HOSPITAL_COMMUNITY): Payer: Medicare HMO

## 2018-09-06 VITALS — BP 118/68 | HR 83 | Temp 98.5°F | Ht 61.0 in | Wt 132.6 lb

## 2018-09-06 DIAGNOSIS — N3000 Acute cystitis without hematuria: Secondary | ICD-10-CM

## 2018-09-06 DIAGNOSIS — R059 Cough, unspecified: Secondary | ICD-10-CM

## 2018-09-06 DIAGNOSIS — J189 Pneumonia, unspecified organism: Secondary | ICD-10-CM

## 2018-09-06 DIAGNOSIS — R05 Cough: Secondary | ICD-10-CM | POA: Diagnosis not present

## 2018-09-06 SURGERY — VIDEO BRONCHOSCOPY WITHOUT FLUORO
Anesthesia: Moderate Sedation | Laterality: Bilateral

## 2018-09-06 MED ORDER — LEVOFLOXACIN 500 MG PO TABS: 500.0000 mg | ORAL_TABLET | Freq: Every day | ORAL | 0 refills | Status: AC

## 2018-09-06 MED ORDER — LEVALBUTEROL HCL 0.63 MG/3ML IN NEBU
0.6300 mg | INHALATION_SOLUTION | Freq: Once | RESPIRATORY_TRACT | Status: AC
  Administered 2018-09-06: 0.63 mg via RESPIRATORY_TRACT

## 2018-09-06 NOTE — Telephone Encounter (Signed)
Received email from Dr. Isaiah Serge requesting to cancel Bronch for 09/06/18 at 10:00, as he is sick.  Spoke to [place] with [facility], and canceled bronch.  Pt is aware of this information and voiced her understanding.  Dr. Isaiah Serge please advise when you would like to reschedule bronch. Thanks

## 2018-09-06 NOTE — Telephone Encounter (Signed)
Primary Pulmonologist: PM Last office visit and with whom: 08/27/2018 with PM What do we see them for (pulmonary problems): cough/SOB Last OV assessment/plan:   1. Chilton Greathouse, MD (Physician) at 08/27/2018 4:37 PM - Signed    We will get a chest x-ray today Check some labs including CBC with differential, urine culture, metabolic panel We will prescribe doxycycline 100 mg twice daily for 7 days And prednisone taper starting at 40 mg.  Reduce dose by 10 mg every 3 days Continue using Delsym for cough You can also use Mucinex to help with the chest congestion  We will schedule you for bronchoscope and call you with the date and time of the procedure Follow-up in 2 to 4 weeks.     Was appointment offered to patient (explain)?  Yes today with TP at 230pm  Reason for call: Pt was suppose to have bronchoscopy at [facility] this morning, but it was cancelled. Pt has increase dry cough, SOB with exertion, upper back pain, weakness, and very tired in last 3 days. Pt finished Doxcy this morning, and has 2 days left on prednisone taper. Pt states she is using Delsym cough syrup and musinex and no better in last week. Scheduled appt today with TP at 230pm Nothing further needed.
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
## 2018-09-06 NOTE — Patient Instructions (Signed)
Begin Levaquin 500mg  daily , take with food.  Begin Probiotic daily .  Mucinex DM Twice daily  As needed  Cough/congestion - may use liquid form.  Chest xray today  Follow up in 1 week with Dr. Isaiah Serge or Parrett NP  Please contact office for sooner follow up if symptoms do not improve or worsen or seek emergency care

## 2018-09-07 DIAGNOSIS — N390 Urinary tract infection, site not specified: Secondary | ICD-10-CM | POA: Insufficient documentation

## 2018-09-07 DIAGNOSIS — J189 Pneumonia, unspecified organism: Secondary | ICD-10-CM | POA: Insufficient documentation

## 2018-09-07 NOTE — Assessment & Plan Note (Signed)
Atypical pneumonia with patchy infiltrates on CT chest suspicious for chronic MAI.  Patient will need to undergo bronchoscopy with BAL cultures .  She continues to be acutely ill.  O2 saturations are good on room air.  She has a good appetite with no nausea vomiting diarrhea.  We will continue with antibiotic treatment in the outpatient setting.  Will have close follow-up in 1 week or sooner if needed.  On return we will decide on scheduling of bronchoscopy.  Plan  Patient Instructions  Begin Levaquin 500mg  daily , take with food.  Begin Probiotic daily .  Mucinex DM Twice daily  As needed  Cough/congestion - may use liquid form.  Chest xray today  Follow up in 1 week with Dr. Isaiah Serge or Parrett NP  Please contact office for sooner follow up if symptoms do not improve or worsen or seek emergency care

## 2018-09-07 NOTE — Assessment & Plan Note (Signed)
Urinary tract infection E. coli on culture Levaquin.  Will need follow-up with primary care physician  Plan  Patient Instructions  Begin Levaquin 500mg  daily , take with food.  Begin Probiotic daily .  Mucinex DM Twice daily  As needed  Cough/congestion - may use liquid form.  Chest xray today  Follow up in 1 week with Dr. Isaiah Serge or Destani Wamser NP  Please contact office for sooner follow up if symptoms do not improve or worsen or seek emergency care

## 2018-09-09 ENCOUNTER — Telehealth: Payer: Self-pay | Admitting: Pulmonary Disease

## 2018-09-09 ENCOUNTER — Telehealth: Payer: Self-pay | Admitting: Cardiology

## 2018-09-09 NOTE — Telephone Encounter (Signed)
Spoke with pt, she is wondering when she can have her bronch rescheduled since her procedure had to be rescheduled due to Dr. Isaiah Serge being sick. Dr. Isaiah Serge do you know when this can be rescheduled? She is having back pain and wanted it rescheduled as soon as possible.    Eliseo Gum, CMA    09/06/18 8:04 AM  Note    Received email from Dr. Isaiah Serge requesting to cancel Bronch for 09/06/18 at 10:00, as he is sick.  Spoke to [place] with [facility], and canceled bronch.  Pt is aware of this information and voiced her understanding.  Dr. Isaiah Serge please advise when you would like to reschedule bronch. Thanks

## 2018-09-09 NOTE — Telephone Encounter (Signed)
Pt called to report that she has been having a lot of problems with her "lungs"..she says that she has been struggling with pneumonia and has been coughing a lot.. she had a CT and Dr. Isaiah Serge was suppose to be doing a lung biopsy this past Monday but he was out with the flu and he cancelled the procedure.. she has been scheduled to see his PA on 09/17/18.Marland Kitchen but she is having pain in her chest when she coughs and says it hurts with movement.. she also says that when she walks to the mailbox.. her chest hurts because walking causes her to cough but then she has numbness in her left arm... she denies dizziness. I offered her an appt today but she declined and said she cannot come today or tomorrow because her husband has a lot of appts on the calendar.. she wants to be seen with her husband next week when her husband sees [facility]... I advised her that his schedule is full that day and we do not "double" up appts but she could consider going to the ER if she feels really bad and the weekend is coming and offices will be closed.. .. but she strongly declined... I also urged her to call Dr. Shirlee More office and talk with them about being seen sooner or to let them know how she is feeling and to find out what their plan is for them. She agreed and will call us back after she hears back from them.. I advised her if her symptoms worsen to call EMS or to consider having someone drive her tot he ER.

## 2018-09-09 NOTE — Telephone Encounter (Signed)
agree

## 2018-09-12 ENCOUNTER — Emergency Department (HOSPITAL_COMMUNITY)
Admission: EM | Admit: 2018-09-12 | Discharge: 2018-09-12 | Disposition: A | Discharge status: Home / Self Care | Payer: Medicare HMO | Attending: Emergency Medicine | Admitting: Emergency Medicine

## 2018-09-12 ENCOUNTER — Emergency Department (HOSPITAL_COMMUNITY): Payer: Medicare HMO

## 2018-09-12 ENCOUNTER — Encounter (HOSPITAL_COMMUNITY): Payer: Self-pay | Admitting: Emergency Medicine

## 2018-09-12 DIAGNOSIS — Z7982 Long term (current) use of aspirin: Secondary | ICD-10-CM | POA: Insufficient documentation

## 2018-09-12 DIAGNOSIS — M5412 Radiculopathy, cervical region: Secondary | ICD-10-CM | POA: Diagnosis not present

## 2018-09-12 DIAGNOSIS — E876 Hypokalemia: Secondary | ICD-10-CM | POA: Insufficient documentation

## 2018-09-12 DIAGNOSIS — E785 Hyperlipidemia, unspecified: Secondary | ICD-10-CM | POA: Diagnosis not present

## 2018-09-12 DIAGNOSIS — I129 Hypertensive chronic kidney disease with stage 1 through stage 4 chronic kidney disease, or unspecified chronic kidney disease: Secondary | ICD-10-CM | POA: Diagnosis not present

## 2018-09-12 DIAGNOSIS — M4692 Unspecified inflammatory spondylopathy, cervical region: Secondary | ICD-10-CM

## 2018-09-12 DIAGNOSIS — N183 Chronic kidney disease, stage 3 (moderate): Secondary | ICD-10-CM | POA: Diagnosis not present

## 2018-09-12 DIAGNOSIS — I2510 Atherosclerotic heart disease of native coronary artery without angina pectoris: Secondary | ICD-10-CM | POA: Diagnosis not present

## 2018-09-12 DIAGNOSIS — Z79899 Other long term (current) drug therapy: Secondary | ICD-10-CM | POA: Diagnosis not present

## 2018-09-12 DIAGNOSIS — Z951 Presence of aortocoronary bypass graft: Secondary | ICD-10-CM | POA: Diagnosis not present

## 2018-09-12 DIAGNOSIS — M79602 Pain in left arm: Secondary | ICD-10-CM

## 2018-09-12 DIAGNOSIS — R200 Anesthesia of skin: Secondary | ICD-10-CM | POA: Diagnosis not present

## 2018-09-12 LAB — CBC
HCT: 33.6 % — ABNORMAL LOW (ref 36.0–46.0)
Hemoglobin: 9.7 g/dL — ABNORMAL LOW (ref 12.0–15.0)
MCH: 22.8 pg — ABNORMAL LOW (ref 26.0–34.0)
MCHC: 28.9 g/dL — ABNORMAL LOW (ref 30.0–36.0)
MCV: 79.1 fL — ABNORMAL LOW (ref 80.0–100.0)
Platelets: 321 10*3/uL (ref 150–400)
RBC: 4.25 MIL/uL (ref 3.87–5.11)
RDW: 16.9 % — ABNORMAL HIGH (ref 11.5–15.5)
WBC: 14.9 10*3/uL — ABNORMAL HIGH (ref 4.0–10.5)
nRBC: 0 % (ref 0.0–0.2)

## 2018-09-12 LAB — BASIC METABOLIC PANEL
Anion gap: 10 (ref 5–15)
BUN: 29 mg/dL — ABNORMAL HIGH (ref 8–23)
CO2: 22 mmol/L (ref 22–32)
Calcium: 9.1 mg/dL (ref 8.9–10.3)
Chloride: 103 mmol/L (ref 98–111)
Creatinine, Ser: 1.82 mg/dL — ABNORMAL HIGH (ref 0.44–1.00)
GFR calc Af Amer: 29 mL/min — ABNORMAL LOW (ref >60)
GFR calc non Af Amer: 25 mL/min — ABNORMAL LOW (ref >60)
Glucose, Bld: 108 mg/dL — ABNORMAL HIGH (ref 70–99)
Potassium: 2.7 mmol/L — CL (ref 3.5–5.1)
Sodium: 135 mmol/L (ref 135–145)

## 2018-09-12 LAB — I-STAT TROPONIN, ED: Troponin i, poc: 0.03 ng/mL (ref 0.00–0.08)

## 2018-09-12 LAB — TROPONIN I: Troponin I: 0.03 ng/mL (ref <0.03)

## 2018-09-12 MED ORDER — POTASSIUM CHLORIDE 10 MEQ/100ML IV SOLN
10.0000 meq | Freq: Once | INTRAVENOUS | Status: AC
  Administered 2018-09-12: 10 meq via INTRAVENOUS
  Filled 2018-09-12: qty 100

## 2018-09-12 MED ORDER — SODIUM CHLORIDE 0.9% FLUSH
3.0000 mL | Freq: Once | INTRAVENOUS | Status: AC
  Administered 2018-09-12: 3 mL via INTRAVENOUS

## 2018-09-12 MED ORDER — GABAPENTIN 100 MG PO CAPS: 100.0000 mg | ORAL_CAPSULE | Freq: Three times a day (TID) | ORAL | 0 refills | Status: DC

## 2018-09-12 MED ORDER — MAGNESIUM SULFATE 2 GM/50ML IV SOLN
2.0000 g | Freq: Once | INTRAVENOUS | Status: AC
  Administered 2018-09-12: 2 g via INTRAVENOUS
  Filled 2018-09-12: qty 50

## 2018-09-12 MED ORDER — SODIUM CHLORIDE 0.9 % IV BOLUS
500.0000 mL | Freq: Once | INTRAVENOUS | Status: AC
  Administered 2018-09-12: 500 mL via INTRAVENOUS

## 2018-09-12 MED ORDER — POTASSIUM CHLORIDE CRYS ER 20 MEQ PO TBCR
60.0000 meq | EXTENDED_RELEASE_TABLET | Freq: Once | ORAL | Status: AC
  Administered 2018-09-12: 60 meq via ORAL
  Filled 2018-09-12: qty 3
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
## 2018-09-12 NOTE — ED Triage Notes (Signed)
Patient reports pain in her L arm and chest as well as tingling in her L arm that she has had for the past few weeks. She states she has been seen here for same symptoms in February. She was told that she needed a lung biopsy but was not able to get it.

## 2018-09-12 NOTE — ED Notes (Signed)
Patient verbalizes understanding of discharge instructions. Opportunity for questioning and answers were provided. Armband removed by staff, pt discharged from ED.  

## 2018-09-13 DIAGNOSIS — K219 Gastro-esophageal reflux disease without esophagitis: Secondary | ICD-10-CM | POA: Insufficient documentation

## 2018-09-14 ENCOUNTER — Encounter: Payer: Self-pay | Admitting: Cardiology

## 2018-09-14 ENCOUNTER — Ambulatory Visit: Payer: Medicare HMO | Admitting: Cardiology

## 2018-09-14 VITALS — BP 112/60 | HR 55 | Ht 61.0 in | Wt 133.0 lb

## 2018-09-14 DIAGNOSIS — N183 Chronic kidney disease, stage 3 unspecified: Secondary | ICD-10-CM

## 2018-09-14 DIAGNOSIS — M79602 Pain in left arm: Secondary | ICD-10-CM | POA: Insufficient documentation

## 2018-09-14 DIAGNOSIS — M47819 Spondylosis without myelopathy or radiculopathy, site unspecified: Secondary | ICD-10-CM

## 2018-09-14 DIAGNOSIS — I6523 Occlusion and stenosis of bilateral carotid arteries: Secondary | ICD-10-CM

## 2018-09-14 DIAGNOSIS — Z952 Presence of prosthetic heart valve: Secondary | ICD-10-CM

## 2018-09-14 DIAGNOSIS — Z951 Presence of aortocoronary bypass graft: Secondary | ICD-10-CM | POA: Diagnosis not present

## 2018-09-14 DIAGNOSIS — I10 Essential (primary) hypertension: Secondary | ICD-10-CM

## 2018-09-14 DIAGNOSIS — J189 Pneumonia, unspecified organism: Secondary | ICD-10-CM

## 2018-09-14 DIAGNOSIS — Z953 Presence of xenogenic heart valve: Secondary | ICD-10-CM | POA: Diagnosis not present

## 2018-09-14 DIAGNOSIS — E876 Hypokalemia: Secondary | ICD-10-CM | POA: Diagnosis not present

## 2018-09-14 LAB — BASIC METABOLIC PANEL
BUN/Creatinine Ratio: 17 (ref 12–28)
BUN: 26 mg/dL (ref 8–27)
CO2: 25 mmol/L (ref 20–29)
Calcium: 9.1 mg/dL (ref 8.7–10.3)
Chloride: 102 mmol/L (ref 96–106)
Creatinine, Ser: 1.52 mg/dL — ABNORMAL HIGH (ref 0.57–1.00)
GFR calc Af Amer: 36 mL/min/{1.73_m2} — ABNORMAL LOW (ref >59)
GFR calc non Af Amer: 31 mL/min/{1.73_m2} — ABNORMAL LOW (ref >59)
Glucose: 86 mg/dL (ref 65–99)
Potassium: 4 mmol/L (ref 3.5–5.2)
Sodium: 134 mmol/L (ref 134–144)

## 2018-09-14 NOTE — Telephone Encounter (Signed)
Rescheduled for 3/19 at [facility] 7:30 AM.

## 2018-09-14 NOTE — Telephone Encounter (Signed)
Called and spoke with patient, she is aware of this. Patient will also be at her visit on 3/13. Nothing further needed.

## 2018-09-14 NOTE — Assessment & Plan Note (Signed)
Moderate- followed by Dr Darrick Penna

## 2018-09-14 NOTE — Assessment & Plan Note (Signed)
She feels better since ABs started 09/07/2018. She is scheduled for bronchoscopy 09/23/2018

## 2018-09-14 NOTE — Telephone Encounter (Signed)
I see that the bronch is currently scheduled for 3/19.   Dr. Isaiah Serge are you still able to do it this day?

## 2018-09-14 NOTE — Telephone Encounter (Signed)
Called and spoke with patient, advised her that we are waiting on response from Dr. Isaiah Serge.   Please advise, thank you.

## 2018-09-14 NOTE — Telephone Encounter (Signed)
Dr. Isaiah Serge please advise on dates that we can reschedule the Bronch. Thank you.

## 2018-09-14 NOTE — Assessment & Plan Note (Signed)
20 mm Edwards Sapien 3 transcatheter heart valve placed via open right transfemoral approach for valve-in-valve replacement for prosthetic valve dysfunction

## 2018-09-14 NOTE — Assessment & Plan Note (Signed)
#  21mm Sorin Mitroflow pericardial tissue valve 

## 2018-09-14 NOTE — Assessment & Plan Note (Signed)
Followed by Dr Jordan Likes- multiple surgeries
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
## 2018-09-14 NOTE — Assessment & Plan Note (Signed)
Controlled- Echo Sept 2019- EF 65%, grade 1 DD

## 2018-09-14 NOTE — Assessment & Plan Note (Signed)
Felt to be secondary to radiculopathy

## 2018-09-14 NOTE — Patient Instructions (Signed)
Medication Instructions:  Your physician recommends that you continue on your current medications as directed. Please refer to the Current Medication list given to you today. If you need a refill on your cardiac medications before your next appointment, please call your pharmacy.   Lab work: Your physician recommends that you return for lab work in: [employer] If you have labs (blood work) drawn today and your tests are completely normal, you will receive your results only by: Marland Kitchen MyChart Message (if you have MyChart) OR . A paper copy in the mail If you have any lab test that is abnormal or we need to change your treatment, we will call you to review the results.  Testing/Procedures: NONE   Follow-Up: At [facility], you and your health needs are our priority.  As part of our continuing mission to provide you with exceptional heart care, we have created designated Provider Care Teams.  These Care Teams include your primary Cardiologist (physician) and Advanced Practice Providers (APPs -  Physician Assistants and Nurse Practitioners) who all work together to provide you with the care you need, when you need it. You will need a follow up appointment in 3 months. You may see [name], MD or one of the following Advanced Practice Providers on your designated Care Team:   Theodore Demark, PA-C . Joni Reining, DNP, ANP  Any Other Special Instructions Will Be Listed Below (If Applicable).

## 2018-09-14 NOTE — Telephone Encounter (Signed)
Pt is calling back [phone]

## 2018-09-14 NOTE — Assessment & Plan Note (Signed)
Check BMP .  

## 2018-09-14 NOTE — Assessment & Plan Note (Signed)
LIMA to LAD, SVG to RCA, sequential SVG to OM1-OM2 Patent grafts at cath sept 2017 

## 2018-09-15 NOTE — Telephone Encounter (Signed)
Bronch has been rescheduled for 09/23/18 at 7:30a. Lm to make pt aware.

## 2018-09-15 NOTE — Telephone Encounter (Signed)
Please see 09/09/18 phone note.

## 2018-09-17 ENCOUNTER — Ambulatory Visit: Payer: Medicare HMO | Admitting: Adult Health

## 2018-09-17 ENCOUNTER — Other Ambulatory Visit: Payer: Self-pay

## 2018-09-17 ENCOUNTER — Encounter: Payer: Self-pay | Admitting: Adult Health

## 2018-09-17 DIAGNOSIS — J189 Pneumonia, unspecified organism: Secondary | ICD-10-CM

## 2018-09-17 DIAGNOSIS — R9389 Abnormal findings on diagnostic imaging of other specified body structures: Secondary | ICD-10-CM | POA: Insufficient documentation

## 2018-09-17 NOTE — Assessment & Plan Note (Signed)
Bilateral infiltrates ? Atypical PNA   She is clinically improved with Levaquin  Has upcoming FOB with cultures  Will need follow up chest xray on return   Plan  Patient Instructions  Follow up for Bronchoscopy as planned next week.   Mucinex DM Twice daily  As needed  Cough/congestion - may use liquid form.  Follow up in 2-3 weeks with Dr. Isaiah Serge or Subhan Hoopes NP with chest xray .  Please contact office for sooner follow up if symptoms do not improve or worsen or seek emergency care

## 2018-09-17 NOTE — Assessment & Plan Note (Signed)
?   Etiology possible MAI with progression since 2016  FOB pending for next week.
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
## 2018-09-17 NOTE — Patient Instructions (Signed)
Follow up for Bronchoscopy as planned next week.   Mucinex DM Twice daily  As needed  Cough/congestion - may use liquid form.  Follow up in 2-3 weeks with Dr. Isaiah Serge or Cullen Vanallen NP with chest xray .  Please contact office for sooner follow up if symptoms do not improve or worsen or seek emergency care

## 2018-09-21 ENCOUNTER — Encounter (HOSPITAL_COMMUNITY): Payer: Self-pay

## 2018-09-21 ENCOUNTER — Ambulatory Visit: Payer: Medicare HMO | Admitting: Adult Health

## 2018-09-21 ENCOUNTER — Other Ambulatory Visit: Payer: Self-pay

## 2018-09-21 ENCOUNTER — Encounter: Payer: Self-pay | Admitting: Adult Health

## 2018-09-21 ENCOUNTER — Telehealth: Payer: Self-pay | Admitting: Pulmonary Disease

## 2018-09-21 ENCOUNTER — Inpatient Hospital Stay (HOSPITAL_COMMUNITY)
Admission: EM | Admit: 2018-09-21 | Discharge: 2018-09-26 | DRG: 291 | Disposition: A | Discharge status: Home / Self Care | Payer: Medicare HMO | Source: Ambulatory Visit | Attending: Internal Medicine | Admitting: Internal Medicine

## 2018-09-21 ENCOUNTER — Ambulatory Visit (INDEPENDENT_AMBULATORY_CARE_PROVIDER_SITE_OTHER): Payer: Medicare HMO

## 2018-09-21 VITALS — BP 126/78 | HR 136 | Temp 98.9°F | Ht 60.0 in | Wt 135.0 lb

## 2018-09-21 DIAGNOSIS — D72829 Elevated white blood cell count, unspecified: Secondary | ICD-10-CM | POA: Diagnosis not present

## 2018-09-21 DIAGNOSIS — N183 Chronic kidney disease, stage 3 unspecified: Secondary | ICD-10-CM | POA: Diagnosis present

## 2018-09-21 DIAGNOSIS — H02401 Unspecified ptosis of right eyelid: Secondary | ICD-10-CM | POA: Diagnosis present

## 2018-09-21 DIAGNOSIS — K219 Gastro-esophageal reflux disease without esophagitis: Secondary | ICD-10-CM | POA: Diagnosis present

## 2018-09-21 DIAGNOSIS — H5461 Unqualified visual loss, right eye, normal vision left eye: Secondary | ICD-10-CM | POA: Diagnosis present

## 2018-09-21 DIAGNOSIS — R0602 Shortness of breath: Secondary | ICD-10-CM | POA: Diagnosis present

## 2018-09-21 DIAGNOSIS — I447 Left bundle-branch block, unspecified: Secondary | ICD-10-CM | POA: Diagnosis present

## 2018-09-21 DIAGNOSIS — J439 Emphysema, unspecified: Secondary | ICD-10-CM | POA: Diagnosis present

## 2018-09-21 DIAGNOSIS — R300 Dysuria: Secondary | ICD-10-CM | POA: Diagnosis not present

## 2018-09-21 DIAGNOSIS — E8770 Fluid overload, unspecified: Secondary | ICD-10-CM

## 2018-09-21 DIAGNOSIS — I5021 Acute systolic (congestive) heart failure: Secondary | ICD-10-CM | POA: Diagnosis present

## 2018-09-21 DIAGNOSIS — I959 Hypotension, unspecified: Secondary | ICD-10-CM | POA: Diagnosis not present

## 2018-09-21 DIAGNOSIS — I130 Hypertensive heart and chronic kidney disease with heart failure and stage 1 through stage 4 chronic kidney disease, or unspecified chronic kidney disease: Secondary | ICD-10-CM | POA: Diagnosis present

## 2018-09-21 DIAGNOSIS — R918 Other nonspecific abnormal finding of lung field: Secondary | ICD-10-CM | POA: Diagnosis present

## 2018-09-21 DIAGNOSIS — N170 Acute kidney failure with tubular necrosis: Secondary | ICD-10-CM | POA: Diagnosis present

## 2018-09-21 DIAGNOSIS — M4802 Spinal stenosis, cervical region: Secondary | ICD-10-CM | POA: Diagnosis present

## 2018-09-21 DIAGNOSIS — Z951 Presence of aortocoronary bypass graft: Secondary | ICD-10-CM | POA: Diagnosis not present

## 2018-09-21 DIAGNOSIS — I2510 Atherosclerotic heart disease of native coronary artery without angina pectoris: Secondary | ICD-10-CM | POA: Diagnosis present

## 2018-09-21 DIAGNOSIS — I4891 Unspecified atrial fibrillation: Secondary | ICD-10-CM | POA: Diagnosis present

## 2018-09-21 DIAGNOSIS — I350 Nonrheumatic aortic (valve) stenosis: Secondary | ICD-10-CM | POA: Diagnosis not present

## 2018-09-21 DIAGNOSIS — I5031 Acute diastolic (congestive) heart failure: Secondary | ICD-10-CM | POA: Diagnosis not present

## 2018-09-21 DIAGNOSIS — E785 Hyperlipidemia, unspecified: Secondary | ICD-10-CM | POA: Diagnosis present

## 2018-09-21 DIAGNOSIS — Z20828 Contact with and (suspected) exposure to other viral communicable diseases: Secondary | ICD-10-CM

## 2018-09-21 DIAGNOSIS — J189 Pneumonia, unspecified organism: Secondary | ICD-10-CM | POA: Diagnosis present

## 2018-09-21 DIAGNOSIS — Z952 Presence of prosthetic heart valve: Secondary | ICD-10-CM

## 2018-09-21 DIAGNOSIS — M47812 Spondylosis without myelopathy or radiculopathy, cervical region: Secondary | ICD-10-CM | POA: Diagnosis present

## 2018-09-21 DIAGNOSIS — R9389 Abnormal findings on diagnostic imaging of other specified body structures: Secondary | ICD-10-CM

## 2018-09-21 DIAGNOSIS — R509 Fever, unspecified: Secondary | ICD-10-CM | POA: Diagnosis present

## 2018-09-21 DIAGNOSIS — A419 Sepsis, unspecified organism: Secondary | ICD-10-CM

## 2018-09-21 DIAGNOSIS — R0603 Acute respiratory distress: Secondary | ICD-10-CM | POA: Diagnosis not present

## 2018-09-21 DIAGNOSIS — N179 Acute kidney failure, unspecified: Secondary | ICD-10-CM | POA: Diagnosis not present

## 2018-09-21 DIAGNOSIS — I509 Heart failure, unspecified: Secondary | ICD-10-CM | POA: Diagnosis not present

## 2018-09-21 DIAGNOSIS — Z8249 Family history of ischemic heart disease and other diseases of the circulatory system: Secondary | ICD-10-CM

## 2018-09-21 DIAGNOSIS — J9601 Acute respiratory failure with hypoxia: Secondary | ICD-10-CM

## 2018-09-21 DIAGNOSIS — R350 Frequency of micturition: Secondary | ICD-10-CM | POA: Diagnosis not present

## 2018-09-21 DIAGNOSIS — I214 Non-ST elevation (NSTEMI) myocardial infarction: Secondary | ICD-10-CM | POA: Diagnosis present

## 2018-09-21 DIAGNOSIS — D509 Iron deficiency anemia, unspecified: Secondary | ICD-10-CM | POA: Diagnosis present

## 2018-09-21 DIAGNOSIS — Z953 Presence of xenogenic heart valve: Secondary | ICD-10-CM

## 2018-09-21 DIAGNOSIS — Z888 Allergy status to other drugs, medicaments and biological substances status: Secondary | ICD-10-CM

## 2018-09-21 DIAGNOSIS — H9190 Unspecified hearing loss, unspecified ear: Secondary | ICD-10-CM | POA: Diagnosis present

## 2018-09-21 DIAGNOSIS — Z880 Allergy status to penicillin: Secondary | ICD-10-CM

## 2018-09-21 DIAGNOSIS — I6529 Occlusion and stenosis of unspecified carotid artery: Secondary | ICD-10-CM | POA: Diagnosis not present

## 2018-09-21 DIAGNOSIS — R079 Chest pain, unspecified: Secondary | ICD-10-CM | POA: Diagnosis not present

## 2018-09-21 DIAGNOSIS — Z79899 Other long term (current) drug therapy: Secondary | ICD-10-CM

## 2018-09-21 DIAGNOSIS — H409 Unspecified glaucoma: Secondary | ICD-10-CM | POA: Diagnosis present

## 2018-09-21 DIAGNOSIS — Z87891 Personal history of nicotine dependence: Secondary | ICD-10-CM

## 2018-09-21 DIAGNOSIS — Z7982 Long term (current) use of aspirin: Secondary | ICD-10-CM

## 2018-09-21 DIAGNOSIS — I5041 Acute combined systolic (congestive) and diastolic (congestive) heart failure: Secondary | ICD-10-CM | POA: Diagnosis not present

## 2018-09-21 DIAGNOSIS — I25810 Atherosclerosis of coronary artery bypass graft(s) without angina pectoris: Secondary | ICD-10-CM | POA: Diagnosis not present

## 2018-09-21 DIAGNOSIS — R011 Cardiac murmur, unspecified: Secondary | ICD-10-CM | POA: Diagnosis not present

## 2018-09-21 DIAGNOSIS — I5023 Acute on chronic systolic (congestive) heart failure: Secondary | ICD-10-CM | POA: Diagnosis not present

## 2018-09-21 LAB — COMPREHENSIVE METABOLIC PANEL
ALBUMIN: 3.1 g/dL — AB (ref 3.5–5.0)
ALT: 14 U/L (ref 0–44)
ANION GAP: 10 (ref 5–15)
AST: 17 U/L (ref 15–41)
Alkaline Phosphatase: 88 U/L (ref 38–126)
BUN: 21 mg/dL (ref 8–23)
CO2: 24 mmol/L (ref 22–32)
Calcium: 9.3 mg/dL (ref 8.9–10.3)
Chloride: 100 mmol/L (ref 98–111)
Creatinine, Ser: 1.29 mg/dL — ABNORMAL HIGH (ref 0.44–1.00)
GFR calc Af Amer: 44 mL/min — ABNORMAL LOW (ref >60)
GFR calc non Af Amer: 38 mL/min — ABNORMAL LOW (ref >60)
GLUCOSE: 132 mg/dL — AB (ref 70–99)
Potassium: 3.2 mmol/L — ABNORMAL LOW (ref 3.5–5.1)
Sodium: 134 mmol/L — ABNORMAL LOW (ref 135–145)
Total Bilirubin: 1.6 mg/dL — ABNORMAL HIGH (ref 0.3–1.2)
Total Protein: 6.5 g/dL (ref 6.5–8.1)

## 2018-09-21 LAB — I-STAT TROPONIN, ED
TROPONIN I, POC: 0.1 ng/mL — AB (ref 0.00–0.08)
Troponin i, poc: 0.08 ng/mL (ref 0.00–0.08)

## 2018-09-21 LAB — BRAIN NATRIURETIC PEPTIDE: B Natriuretic Peptide: 1334.7 pg/mL — ABNORMAL HIGH (ref 0.0–100.0)

## 2018-09-21 LAB — CBC WITH DIFFERENTIAL/PLATELET
Abs Immature Granulocytes: 0.14 10*3/uL — ABNORMAL HIGH (ref 0.00–0.07)
BASOS PCT: 0 %
Basophils Absolute: 0.1 10*3/uL (ref 0.0–0.1)
Eosinophils Absolute: 0 10*3/uL (ref 0.0–0.5)
Eosinophils Relative: 0 %
HCT: 33.8 % — ABNORMAL LOW (ref 36.0–46.0)
Hemoglobin: 9.9 g/dL — ABNORMAL LOW (ref 12.0–15.0)
Immature Granulocytes: 1 %
Lymphocytes Relative: 16 %
Lymphs Abs: 2.6 10*3/uL (ref 0.7–4.0)
MCH: 22.4 pg — ABNORMAL LOW (ref 26.0–34.0)
MCHC: 29.3 g/dL — ABNORMAL LOW (ref 30.0–36.0)
MCV: 76.6 fL — AB (ref 80.0–100.0)
MONOS PCT: 6 %
Monocytes Absolute: 1 10*3/uL (ref 0.1–1.0)
NEUTROS PCT: 77 %
Neutro Abs: 12.3 10*3/uL — ABNORMAL HIGH (ref 1.7–7.7)
PLATELETS: 290 10*3/uL (ref 150–400)
RBC: 4.41 MIL/uL (ref 3.87–5.11)
RDW: 18.6 % — ABNORMAL HIGH (ref 11.5–15.5)
WBC: 16 10*3/uL — ABNORMAL HIGH (ref 4.0–10.5)
nRBC: 0 % (ref 0.0–0.2)

## 2018-09-21 LAB — INFLUENZA PANEL BY PCR (TYPE A & B)
Influenza A By PCR: NEGATIVE
Influenza B By PCR: NEGATIVE

## 2018-09-21 LAB — TROPONIN I: Troponin I: 0.09 ng/mL (ref <0.03)

## 2018-09-21 LAB — MAGNESIUM: Magnesium: 1.8 mg/dL (ref 1.7–2.4)

## 2018-09-21 LAB — LACTIC ACID, PLASMA
Lactic Acid, Venous: 1.2 mmol/L (ref 0.5–1.9)
Lactic Acid, Venous: 1.2 mmol/L (ref 0.5–1.9)

## 2018-09-21 LAB — TSH: TSH: 3.23 u[IU]/mL (ref 0.350–4.500)

## 2018-09-21 MED ORDER — SODIUM CHLORIDE 0.9% FLUSH
3.0000 mL | Freq: Two times a day (BID) | INTRAVENOUS | Status: DC
  Administered 2018-09-22 – 2018-09-25 (×5): 3 mL via INTRAVENOUS

## 2018-09-21 MED ORDER — ACETAMINOPHEN 325 MG PO TABS
650.0000 mg | ORAL_TABLET | Freq: Four times a day (QID) | ORAL | Status: DC | PRN
  Administered 2018-09-22 – 2018-09-25 (×4): 650 mg via ORAL
  Filled 2018-09-21 (×4): qty 2

## 2018-09-21 MED ORDER — DILTIAZEM LOAD VIA INFUSION
10.0000 mg | Freq: Once | INTRAVENOUS | Status: DC
  Filled 2018-09-21: qty 10

## 2018-09-21 MED ORDER — SODIUM CHLORIDE 0.9 % IV SOLN
500.0000 mg | Freq: Once | INTRAVENOUS | Status: AC
  Administered 2018-09-21: 500 mg via INTRAVENOUS
  Filled 2018-09-21: qty 500

## 2018-09-21 MED ORDER — ASPIRIN EC 81 MG PO TBEC
81.0000 mg | DELAYED_RELEASE_TABLET | Freq: Every day | ORAL | Status: DC
  Administered 2018-09-22 – 2018-09-26 (×5): 81 mg via ORAL
  Filled 2018-09-21 (×5): qty 1

## 2018-09-21 MED ORDER — ACETAMINOPHEN 650 MG RE SUPP
650.0000 mg | Freq: Four times a day (QID) | RECTAL | Status: DC | PRN
  Filled 2018-09-21: qty 1

## 2018-09-21 MED ORDER — SENNOSIDES-DOCUSATE SODIUM 8.6-50 MG PO TABS: 1.0000 | ORAL_TABLET | Freq: Every evening | ORAL | Status: DC | PRN

## 2018-09-21 MED ORDER — ATORVASTATIN CALCIUM 80 MG PO TABS
80.0000 mg | ORAL_TABLET | Freq: Every day | ORAL | Status: DC
  Administered 2018-09-22 – 2018-09-26 (×5): 80 mg via ORAL
  Filled 2018-09-21 (×5): qty 1

## 2018-09-21 MED ORDER — HYDROCHLOROTHIAZIDE 25 MG PO TABS: 25.0000 mg | ORAL_TABLET | Freq: Every day | ORAL | Status: DC

## 2018-09-21 MED ORDER — VITAMIN D 25 MCG (1000 UNIT) PO TABS
2000.0000 [IU] | ORAL_TABLET | Freq: Every day | ORAL | Status: DC
  Administered 2018-09-22 – 2018-09-26 (×5): 2000 [IU] via ORAL
  Filled 2018-09-21 (×5): qty 2

## 2018-09-21 MED ORDER — ACETAMINOPHEN 325 MG PO TABS
650.0000 mg | ORAL_TABLET | Freq: Once | ORAL | Status: AC
  Administered 2018-09-21: 650 mg via ORAL
  Filled 2018-09-21: qty 2

## 2018-09-21 MED ORDER — SODIUM CHLORIDE 0.9% FLUSH: 3.0000 mL | INTRAVENOUS | Status: DC | PRN

## 2018-09-21 MED ORDER — POTASSIUM CHLORIDE CRYS ER 20 MEQ PO TBCR
40.0000 meq | EXTENDED_RELEASE_TABLET | Freq: Two times a day (BID) | ORAL | Status: AC
  Administered 2018-09-22 (×2): 40 meq via ORAL
  Filled 2018-09-21 (×2): qty 2

## 2018-09-21 MED ORDER — FUROSEMIDE 10 MG/ML IJ SOLN
40.0000 mg | Freq: Two times a day (BID) | INTRAMUSCULAR | Status: DC
  Administered 2018-09-22 (×3): 40 mg via INTRAVENOUS
  Filled 2018-09-21 (×3): qty 4

## 2018-09-21 MED ORDER — DILTIAZEM HCL 25 MG/5ML IV SOLN
10.0000 mg | Freq: Once | INTRAVENOUS | Status: AC
  Administered 2018-09-21: 10 mg via INTRAVENOUS

## 2018-09-21 MED ORDER — GABAPENTIN 100 MG PO CAPS
100.0000 mg | ORAL_CAPSULE | Freq: Three times a day (TID) | ORAL | Status: DC
  Administered 2018-09-22 – 2018-09-26 (×14): 100 mg via ORAL
  Filled 2018-09-21 (×14): qty 1

## 2018-09-21 MED ORDER — SODIUM CHLORIDE 0.9 % IV SOLN
1.0000 g | Freq: Once | INTRAVENOUS | Status: AC
  Administered 2018-09-21: 1 g via INTRAVENOUS
  Filled 2018-09-21: qty 10

## 2018-09-21 MED ORDER — DILTIAZEM HCL-DEXTROSE 100-5 MG/100ML-% IV SOLN (PREMIX)
5.0000 mg/h | INTRAVENOUS | Status: DC
  Administered 2018-09-21: 5 mg/h via INTRAVENOUS
  Filled 2018-09-21: qty 100

## 2018-09-21 MED ORDER — PANTOPRAZOLE SODIUM 40 MG PO TBEC
40.0000 mg | DELAYED_RELEASE_TABLET | Freq: Every day | ORAL | Status: DC
  Administered 2018-09-22 – 2018-09-26 (×5): 40 mg via ORAL
  Filled 2018-09-21 (×5): qty 1

## 2018-09-21 MED ORDER — NITROGLYCERIN 0.4 MG SL SUBL: 0.4000 mg | SUBLINGUAL_TABLET | SUBLINGUAL | Status: DC | PRN

## 2018-09-21 MED ORDER — KETOROLAC TROMETHAMINE 0.5 % OP SOLN
1.0000 [drp] | Freq: Two times a day (BID) | OPHTHALMIC | Status: DC
  Administered 2018-09-22 – 2018-09-26 (×10): 1 [drp] via OPHTHALMIC
  Filled 2018-09-21: qty 5

## 2018-09-21 MED ORDER — SODIUM CHLORIDE 0.9% FLUSH
3.0000 mL | Freq: Two times a day (BID) | INTRAVENOUS | Status: DC
  Administered 2018-09-22 – 2018-09-25 (×3): 3 mL via INTRAVENOUS

## 2018-09-21 MED ORDER — HEPARIN SODIUM (PORCINE) 5000 UNIT/ML IJ SOLN: 5000.0000 [IU] | Freq: Three times a day (TID) | INTRAMUSCULAR | Status: DC

## 2018-09-21 MED ORDER — TIMOLOL MALEATE 0.5 % OP SOLN
1.0000 [drp] | Freq: Two times a day (BID) | OPHTHALMIC | Status: DC
  Administered 2018-09-22 – 2018-09-26 (×10): 1 [drp] via OPHTHALMIC
  Filled 2018-09-21: qty 5

## 2018-09-21 NOTE — Telephone Encounter (Signed)
Please see note. Patient may need office visit. Please advise.

## 2018-09-21 NOTE — Telephone Encounter (Signed)
Per TP she would like for the patient to come in today with a CXR before. Called and spoke with patient, she is coming in at 3:00 today with a cxr. TP aware. Nothing further needed. Order placed.

## 2018-09-21 NOTE — Assessment & Plan Note (Signed)
New onset A FIB with RVR -  EKG done with HR 156, A FIB w/ RVR .  Patient placed on Oxygen 2l/m . She is stable with normal b/p , alert and oriented x 3 .  Refer to ER via EMS  EMS arrival with placement on monitor .  Report given  Pt transferred in stable condition to EMS

## 2018-09-21 NOTE — ED Notes (Signed)
Attempted in & out cath without success.  
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
## 2018-09-21 NOTE — Telephone Encounter (Signed)
Called and spoke with patient, she stated that she still feels like she has fluid in her lungs, they feel heavy and sometimes hurt. She denied any other symptoms. No cough, no congestion, no fever, no body aches or chills. No recent travel.   Tonya please advise, thank you.
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
## 2018-09-21 NOTE — ED Triage Notes (Signed)
Pt BIB ems from PCP for a fib RVR, pt went to PCP for c.o chest pressure and SOB. HR ranging from 180-150bpm. 5mg  metoprolol IV given en route, HR then ranging from 168-90bpm.    92% on room air, pt on 2L [place] upon arrival 100% BP 150/97 rr 26 upon ems arrival, 24 upon ED arrival.  Pt a.o, nad noted.

## 2018-09-21 NOTE — ED Notes (Signed)
Cardiology at bedside.
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
## 2018-09-21 NOTE — ED Notes (Signed)
ED Provider at bedside. 

## 2018-09-21 NOTE — Assessment & Plan Note (Signed)
Will need ongoing evaluation as previous infiltrates have not resolved .  Consult pulmonary if needed during hospitalization .
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
## 2018-09-21 NOTE — Patient Instructions (Signed)
Transfer to ER via EMS  
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
## 2018-09-22 ENCOUNTER — Inpatient Hospital Stay (HOSPITAL_COMMUNITY): Payer: Medicare HMO

## 2018-09-22 DIAGNOSIS — R300 Dysuria: Secondary | ICD-10-CM

## 2018-09-22 DIAGNOSIS — Z881 Allergy status to other antibiotic agents status: Secondary | ICD-10-CM

## 2018-09-22 DIAGNOSIS — Z87891 Personal history of nicotine dependence: Secondary | ICD-10-CM

## 2018-09-22 DIAGNOSIS — I6529 Occlusion and stenosis of unspecified carotid artery: Secondary | ICD-10-CM

## 2018-09-22 DIAGNOSIS — Z952 Presence of prosthetic heart valve: Secondary | ICD-10-CM

## 2018-09-22 DIAGNOSIS — H5461 Unqualified visual loss, right eye, normal vision left eye: Secondary | ICD-10-CM

## 2018-09-22 DIAGNOSIS — I350 Nonrheumatic aortic (valve) stenosis: Secondary | ICD-10-CM

## 2018-09-22 DIAGNOSIS — I4891 Unspecified atrial fibrillation: Secondary | ICD-10-CM

## 2018-09-22 DIAGNOSIS — Z9889 Other specified postprocedural states: Secondary | ICD-10-CM

## 2018-09-22 DIAGNOSIS — I509 Heart failure, unspecified: Secondary | ICD-10-CM

## 2018-09-22 DIAGNOSIS — Z8719 Personal history of other diseases of the digestive system: Secondary | ICD-10-CM

## 2018-09-22 DIAGNOSIS — R509 Fever, unspecified: Secondary | ICD-10-CM | POA: Diagnosis present

## 2018-09-22 DIAGNOSIS — D509 Iron deficiency anemia, unspecified: Secondary | ICD-10-CM

## 2018-09-22 DIAGNOSIS — Z888 Allergy status to other drugs, medicaments and biological substances status: Secondary | ICD-10-CM

## 2018-09-22 DIAGNOSIS — D72829 Elevated white blood cell count, unspecified: Secondary | ICD-10-CM

## 2018-09-22 DIAGNOSIS — R0603 Acute respiratory distress: Secondary | ICD-10-CM

## 2018-09-22 DIAGNOSIS — Z8701 Personal history of pneumonia (recurrent): Secondary | ICD-10-CM

## 2018-09-22 DIAGNOSIS — K219 Gastro-esophageal reflux disease without esophagitis: Secondary | ICD-10-CM

## 2018-09-22 DIAGNOSIS — I2510 Atherosclerotic heart disease of native coronary artery without angina pectoris: Secondary | ICD-10-CM

## 2018-09-22 DIAGNOSIS — I959 Hypotension, unspecified: Secondary | ICD-10-CM

## 2018-09-22 DIAGNOSIS — Z79899 Other long term (current) drug therapy: Secondary | ICD-10-CM

## 2018-09-22 DIAGNOSIS — R0602 Shortness of breath: Secondary | ICD-10-CM

## 2018-09-22 DIAGNOSIS — H9190 Unspecified hearing loss, unspecified ear: Secondary | ICD-10-CM

## 2018-09-22 DIAGNOSIS — N183 Chronic kidney disease, stage 3 (moderate): Secondary | ICD-10-CM

## 2018-09-22 DIAGNOSIS — I5031 Acute diastolic (congestive) heart failure: Secondary | ICD-10-CM

## 2018-09-22 DIAGNOSIS — R350 Frequency of micturition: Secondary | ICD-10-CM

## 2018-09-22 DIAGNOSIS — Z951 Presence of aortocoronary bypass graft: Secondary | ICD-10-CM

## 2018-09-22 LAB — BASIC METABOLIC PANEL
Anion gap: 12 (ref 5–15)
BUN: 20 mg/dL (ref 8–23)
CO2: 26 mmol/L (ref 22–32)
CREATININE: 1.25 mg/dL — AB (ref 0.44–1.00)
Calcium: 9.5 mg/dL (ref 8.9–10.3)
Chloride: 98 mmol/L (ref 98–111)
GFR calc Af Amer: 46 mL/min — ABNORMAL LOW (ref >60)
GFR calc non Af Amer: 40 mL/min — ABNORMAL LOW (ref >60)
Glucose, Bld: 111 mg/dL — ABNORMAL HIGH (ref 70–99)
Potassium: 3.6 mmol/L (ref 3.5–5.1)
Sodium: 136 mmol/L (ref 135–145)

## 2018-09-22 LAB — ECHOCARDIOGRAM LIMITED
Height: 60 in
Weight: 2160 oz

## 2018-09-22 LAB — URINALYSIS, ROUTINE W REFLEX MICROSCOPIC
Bilirubin Urine: NEGATIVE
Glucose, UA: NEGATIVE mg/dL
Hgb urine dipstick: NEGATIVE
Ketones, ur: NEGATIVE mg/dL
Nitrite: NEGATIVE
Protein, ur: NEGATIVE mg/dL
Specific Gravity, Urine: 1.009 (ref 1.005–1.030)
pH: 5 (ref 5.0–8.0)

## 2018-09-22 LAB — RESPIRATORY PANEL BY PCR

## 2018-09-22 LAB — HEPARIN LEVEL (UNFRACTIONATED): Heparin Unfractionated: 0.67 IU/mL (ref 0.30–0.70)

## 2018-09-22 LAB — CBC
HCT: 29.9 % — ABNORMAL LOW (ref 36.0–46.0)
Hemoglobin: 9.3 g/dL — ABNORMAL LOW (ref 12.0–15.0)
MCH: 23.4 pg — AB (ref 26.0–34.0)
MCHC: 31.1 g/dL (ref 30.0–36.0)
MCV: 75.1 fL — ABNORMAL LOW (ref 80.0–100.0)
Platelets: 259 10*3/uL (ref 150–400)
RBC: 3.98 MIL/uL (ref 3.87–5.11)
RDW: 18.5 % — ABNORMAL HIGH (ref 11.5–15.5)
WBC: 13.4 10*3/uL — ABNORMAL HIGH (ref 4.0–10.5)
nRBC: 0.1 % (ref 0.0–0.2)

## 2018-09-22 LAB — IRON AND TIBC
Iron: 22 ug/dL — ABNORMAL LOW (ref 28–170)
Saturation Ratios: 7 % — ABNORMAL LOW (ref 10.4–31.8)
TIBC: 315 ug/dL (ref 250–450)
UIBC: 293 ug/dL

## 2018-09-22 LAB — TROPONIN I: Troponin I: 0.07 ng/mL (ref <0.03)

## 2018-09-22 LAB — FERRITIN: Ferritin: 44 ng/mL (ref 11–307)

## 2018-09-22 MED ORDER — HEPARIN BOLUS VIA INFUSION
3500.0000 [IU] | Freq: Once | INTRAVENOUS | Status: AC
  Administered 2018-09-22: 3500 [IU] via INTRAVENOUS
  Filled 2018-09-22: qty 3500

## 2018-09-22 MED ORDER — METOPROLOL TARTRATE 25 MG PO TABS
25.0000 mg | ORAL_TABLET | Freq: Two times a day (BID) | ORAL | Status: DC
  Administered 2018-09-22: 25 mg via ORAL
  Filled 2018-09-22: qty 1

## 2018-09-22 MED ORDER — AMIODARONE HCL IN DEXTROSE 360-4.14 MG/200ML-% IV SOLN
60.0000 mg/h | INTRAVENOUS | Status: DC
  Administered 2018-09-22 – 2018-09-23 (×3): 30 mg/h via INTRAVENOUS
  Filled 2018-09-22 (×4): qty 200

## 2018-09-22 MED ORDER — METOPROLOL TARTRATE 12.5 MG HALF TABLET
12.5000 mg | ORAL_TABLET | Freq: Two times a day (BID) | ORAL | Status: DC
  Administered 2018-09-22 – 2018-09-24 (×4): 12.5 mg via ORAL
  Filled 2018-09-22 (×4): qty 1

## 2018-09-22 MED ORDER — HEPARIN (PORCINE) 25000 UT/250ML-% IV SOLN
850.0000 [IU]/h | INTRAVENOUS | Status: DC
  Administered 2018-09-22 – 2018-09-24 (×3): 850 [IU]/h via INTRAVENOUS
  Filled 2018-09-22 (×5): qty 250

## 2018-09-22 NOTE — Progress Notes (Signed)
Pt having burning with urination- MD notified and orders given to administer Tylenol and obtain urine specimens asap-  Issues with purewic staying in place- urinated on bed 2 X

## 2018-09-22 NOTE — Progress Notes (Signed)
Reevaluated patient for hypotension.  Her HR has decreased to the range of 110-120 after initiating amiodarone drip and metoprolol 25 mg.  However, her BP has dropped to 82/62.  She reports she was dizzy when she sat up to try to eat lunch.    On my reevaluation, she remains in mild respiratory distress with mild tachypnea and some accessory muscle use.  She does appear to have elevated JVP and bibasilar crackles with diminished lung sounds in the bases.  Her extremities are warm and well-perfused.  Cardiology has already decreased the metoprolol dose to 12.5 mg twice daily going forward.  We will monitor closely for HR response and BP.  Regarding her infection prevention precautions, I appreciate the infectious disease team seeing her and they have recommended stopping contact and droplet precautions as she is at very low risk for COVID-19.  I agree that her respiratory symptoms are likely explained by her new onset heart failure.  However, I still do not have a source for her fever.  Her RN is working on trying to get a urine sample for [place] so we can evaluate for UTI.  I would advocate for continuing precautions until we have the results of her COVID-19 testing or an alternative explanation for her fever.  At this time we simply do not know how widespread community transmission is in our area because of the limited testing resources and we must be exposing healthcare professionals while also being judicious with her use of PPE.  Finally, it appears her bronchoscopy was scheduled for tomorrow.  We will communicate with her pulmonologist's office to ensure they are aware that she is in the hospital.  While we may be able to coordinate getting the bronchoscopy done while she is admitted, I would want her to be more stable from a cardiovascular standpoint first.  I have updated both her and her husband on the plan.
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
## 2018-09-22 NOTE — Progress Notes (Signed)
Subjective: She feels slightly better today, breathing a little bit easier.  She still feels very tired.  I discussed with her that although we have low suspicion for Covid 19, given her fever, shortness of breath, and worsening CXR, I think it is prudent to test her.  She mentioned a friend was going to come visit her today, and I asked her to call a friend and asked her not to visit at this time.  Objective:  Vital signs in last 24 hours: Vitals:   09/21/18 2229 09/21/18 2230 09/21/18 2359 09/22/18 0423  BP:  113/85  123/88  Pulse:  (!) 58  (!) 132  Resp: (!) 24 (!) 28    Temp:   98.5 F (36.9 C) 98.6 F (37 C)  TempSrc:      SpO2:  97%  97%   General: Appears tired, mild respiratory distress HEENT: No rhinorrhea, right eye ptosis Neck: Prominent carotid pulsation makes JVP estimation difficult, but JVP appears to be present to angle of mandible sitting at 30 degrees with positive abdominojugular reflex Cardiac: Tachycardic, irregularly irregular, HR 140s, murmur difficult to discern given tachycardia and irregularity, possible systolic murmur at right upper sternal border Pulmonary: Tachypneic in the mid 20s, mildly increased respiratory effort, diminished bases with crackles bilaterally in the lower third of the lung fields posteriorly Abdomen: Soft, nontender, normal bowel sounds Extremities: Variable pulses, warm, brisk cap refill, 1+ pitting edema to the midshin   Assessment/Plan:  Principal Problem:   Atrial fibrillation with RVR (HCC) Active Problems:   Shortness of breath   S/P TAVR 2016   CKD (chronic kidney disease) stage 3, GFR 30-59 ml/min (HCC)   Acute heart failure (HCC)   Fever, unspecified  This is an 85 year old woman with history of CAD with prior CABG, aortic stenosis with AVR in 2009 and TAVR in 2016, CKD stage III, and recent treatment for possible atypical pneumonia admitted for chest pain and shortness of breath found to have new onset acute heart  failure and atrial fibrillation with RVR.  # Atrial fibrillation with RVR: Unclear at this time if atrial fibrillation was precipitated by acute heart failure, or vice versa.  Given her fever, also possible that an infectious process triggered the atrial fibrillation and subsequently acute heart failure.  Still in A. fib with significant RVR, appreciate cardiology recommendations, will initiate amiodarone drip as well as metoprolol.  TTE preliminary read suggests normal EF, although it is very limited by her tachycardia. - Amiodarone drip - Metoprolol 25 mg twice daily, perfusing well at this time - Telemetry, will watch HR closely, target HR <110 - Started on heparin drip for anticoagulation, will transition to oral meds when appropriate  # Acute decompensated heart failure: Unclear etiology, possibly due to A. fib with RVR as above, also has significant history of CAD with CABG, but troponins remain low consistent with mild demand NSTEMI but not suggestive of acute coronary syndrome.  She also has significant valvular disease, predominantly aortic stenosis with prior AVR and valve in valve TAVR - TTE today, interpretation will be difficult due to tachycardia, may need to repeat, but per preliminary read by cardiology, LVEF appears normal and valve function appears preserved - Unclear how much she has responded to furosemide so far, I/O appears an accurate, will continue furosemide 40 mg IV twice daily for now and continue reassessing volume status - Will wean oxygen as able today for O2 sat >92%  # Fever and leukocytosis: Unclear etiology, she was  recently being treated for possible atypical pneumonia and seemed to have improved significantly with levofloxacin.  Leukocytosis has overall improved.  CXR on admission with worsened bilateral patchy opacities, possibly due to pulmonary vascular congestion.  Given the duration of her cough and her overall improvement, very low suspicion for COVID-19, but we  will test her and keep her on droplet and contact precautions with visitor restrictions.  Infection prevention notified.  She also is complaining of some dysuria, which may be related to the pure wick being in place.  Will check urinalysis and urine culture.  Blood cultures NGTD.  # Microcytic anemia: History of GI bleed from gastric ulcer requiring transfusion in 2017.  Current testing is consistent with iron deficiency with low normal ferritin, low iron, and low transferrin saturation. - Trend H/H during this admission - Likely will need outpatient work-up for possible source of bleeding  FEN: Cardiac diet, fluid restriction 1.2 L VTE ppx: Heparin drip Code Status: Partial, DNI, would want CPR and ACLS  Dispo: Anticipated discharge in approximately 2-3 day(s).   Anne Shutter, MD 09/22/2018, 6:42 AM Pager: [phone] to reach Dr. Hermine Messick, PGY 1 on our team

## 2018-09-22 NOTE — Progress Notes (Signed)
Resp PCR results- negative- MD notified
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
## 2018-09-22 NOTE — Progress Notes (Addendum)
Progress Note  Patient Name: [name] Date of Encounter: 09/22/2018  Primary Cardiologist: [name], MD   Subjective   Pt is still short of breath but better than on admission. No chest pain. No edema.   Inpatient Medications    Scheduled Meds: . aspirin EC  81 mg Oral Daily  . atorvastatin  80 mg Oral Daily  . cholecalciferol  2,000 Units Oral Daily  . furosemide  40 mg Intravenous BID  . gabapentin  100 mg Oral TID  . hydrochlorothiazide  25 mg Oral Daily  . ketorolac  1 drop Right Eye BID  . pantoprazole  40 mg Oral Daily  . potassium chloride  40 mEq Oral BID  . sodium chloride flush  3 mL Intravenous Q12H  . sodium chloride flush  3 mL Intravenous Q12H  . timolol  1 drop Both Eyes BID   Continuous Infusions: . amiodarone    . heparin 850 Units/hr (09/22/18 0356)   PRN Meds: acetaminophen **OR** acetaminophen, nitroGLYCERIN, senna-docusate, sodium chloride flush   Vital Signs    Vitals:   09/21/18 2229 09/21/18 2230 09/21/18 2359 09/22/18 0423  BP:  113/85  123/88  Pulse:  (!) 58  (!) 132  Resp: (!) 24 (!) 28    Temp:   98.5 F (36.9 C) 98.6 F (37 C)  TempSrc:      SpO2:  97%  97%    Intake/Output Summary (Last 24 hours) at 09/22/2018 0819 Last data filed at 09/22/2018 0356 Gross per 24 hour  Intake 323.54 ml  Output -  Net 323.54 ml   Last 3 Weights 09/21/2018 09/17/2018 09/14/2018  Weight (lbs) 135 lb 135 lb 3.2 oz 133 lb  Weight (kg) 61.236 kg 61.326 kg 60.328 kg      Telemetry    Atrial fibrillation 130's-140's - Personally Reviewed  ECG    Atrial fibrillation with RVR, LBBB - Personally Reviewed  Physical Exam   GEN: Elderly female, No acute distress.   Neck: + JVD Cardiac: Irregularly irregular fast rhythm, no murmurs, rubs, or gallops.  Respiratory: Clear to auscultation bilaterally, diminished breath sounds. GI: Soft, nontender, non-distended  MS: No edema; No deformity. Neuro:  Nonfocal  Psych: Normal affect    Labs    Chemistry Recent Labs  Lab 09/21/18 1637 09/22/18 0313  NA 134* 136  K 3.2* 3.6  CL 100 98  CO2 24 26  GLUCOSE 132* 111*  BUN 21 20  CREATININE 1.29* 1.25*  CALCIUM 9.3 9.5  PROT 6.5  --   ALBUMIN 3.1*  --   AST 17  --   ALT 14  --   ALKPHOS 88  --   BILITOT 1.6*  --   GFRNONAA 38* 40*  GFRAA 44* 46*  ANIONGAP 10 12     Hematology Recent Labs  Lab 09/21/18 1637 09/22/18 0313  WBC 16.0* 13.4*  RBC 4.41 3.98  HGB 9.9* 9.3*  HCT 33.8* 29.9*  MCV 76.6* 75.1*  MCH 22.4* 23.4*  MCHC 29.3* 31.1  RDW 18.6* 18.5*  PLT 290 259    Cardiac Enzymes Recent Labs  Lab 09/21/18 2226 09/22/18 0313  TROPONINI 0.09* 0.07*    Recent Labs  Lab 09/21/18 1657 09/21/18 2032  TROPIPOC 0.08 0.10*     BNP Recent Labs  Lab 09/21/18 1649  BNP 1,334.7*     DDimer No results for input(s): DDIMER in the last 168 hours.   Radiology    Dg Chest 2 View  Result Date: 09/21/2018 CLINICAL DATA:  Shortness of breath EXAM: CHEST - 2 VIEW COMPARISON:  09/12/2018, 09/06/2018, 08/27/2018 FINDINGS: Postsurgical changes of the lower cervical spine. Post sternotomy changes and valve prosthesis. Hyperinflation. Diffuse increased reticular opacity. Mild cardiomegaly with aortic atherosclerosis. No pneumothorax. Slight increased loss of height of midthoracic vertebra IMPRESSION: 1. Diffuse increased interstitial opacity bilaterally, which may be secondary to diffuse interstitial inflammatory process or mild interstitial edema. 2. Mild cardiomegaly. 3. Chronic compression fracture of midthoracic vertebra but with slight increased loss of vertebral height since 09/06/2018. Electronically Signed   By: Jasmine Pang M.D.   On: 09/21/2018 15:24    Cardiac Studies   Echocardiogram 03/24/18 Study Conclusions - Left ventricle: The cavity size was normal. There was moderate   concentric hypertrophy. Systolic function was vigorous. The   estimated ejection fraction was in the range of 65%  to 70%. Wall   motion was normal; there were no regional wall motion   abnormalities. Doppler parameters are consistent with abnormal   left ventricular relaxation (grade 1 diastolic dysfunction). The   E/e&' ratio is >15, suggesting elevated LV filling pressure. - Aortic valve: s/p 20 mm Edwards Sapien THV. No obstruction or   paravalvular leak. Mean gradient (S): 23 mm Hg. Peak gradient   (S): 48 mm Hg. Valve area (VTI): 1.33 cm^2. Valve area (Vmax):   1.2 cm^2. Valve area (Vmean): 1.33 cm^2. - Ascending aorta: The ascending aorta was moderately dilated at   4.66 cm. - Mitral valve: Calcified annulus. Mildly thickened leaflets . Mild   stenosis. There was trivial regurgitation. Valve area by pressure   half-time: 2.5 cm^2. - Left atrium: The atrium was normal in size. - Right ventricle: The cavity size was mildly dilated. Mildly   reduced systolic function. - Right atrium: The atrium was mildly dilated. - Inferior vena cava: The vessel was normal in size. The   respirophasic diameter changes were in the normal range (>= 50%),   consistent with normal central venous pressure.  Impressions: - Compared to a prior study in 2018, the gradients across the TAVR   valve are increased, however, LVEF is higher at 65-70%. LV   filling pressure is elevated. The ascending aorta is moderately   dilated to 4.6 cm.   Patient Profile     85 y.o. 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 New onset atrial fibrillation noted. Pt with fever of 101.7. BNP 1300.   Assessment & Plan    Atrial fibrillation, new onset -Pt admitted with shortness of breath, fever. Hx of bilateral lung infiltrates suggestive of MAI.  -IV amiodarone initiated cautiously due to lung issues.  Rate is still poorly controlled in the 130's-140's. Will add metoprolol for better rate control.  -Not on any BB or CCB due to HF.  -IV heparin started for stroke risk reduction and also questionable valve status.  -Pt possilby has infection driving the afib.  -Troponins mildly elevated in flat pattern consistent with demand ischemia  Heart failure -Pt presented with shortness of breath. No prior hx of CHF.  -BNP 1334.7. -Possibly related to Afib with RVR. -CXR with diffuse increased interstitial opacity bilat, poss diffuse interstitial inflammatory process or mild interstitial edema.  -Has been started on Lasix 40 mg IV BID. No output documented. Pt states that  she was urinating all night but it went on the bed instead of in the pure wick.  -BP stable. Breathing slightly improved.  -Needs strict I&O and daily wts.  -Echo ordered.   Fever and leukocytosis -T 101.7 last night, afebrile through the night. WBCs 16k, was 27k in 08/2018.  -respiratory panel and influenza negative. Covid 19 testing in process.   CAD s/p CABG x4 2009 -Patent grafts at cath 03/2016. -No chest pain.   CKD stage III -SCr currenlty stable in the 1.2 range.  -Continue to follow renal function closely with diuresis.   Hypertension -She has been conitnued on HCTZ but can discontinue this while diuresing with IV lasix.   Hyperlipidemia -On atorvastatin 80 mg daily. LDL was 65 in 01/2018, at goal of <70.      For questions or updates, please contact [facility] Please consult [email or web address] for contact info under        Signed, Berton Bon, NP  09/22/2018, 8:19 AM     I have seen and examined the patient along with Berton Bon, NP .  I have reviewed the chart, notes and new data.  I agree with PA/NP's note.  Key new complaints: still feels intermittently"hot", breathing is a little better Key examination changes: irregular tachycardia, faint systolic murmur Key new findings / data: preliminary  review of bedside echo appears to show normal-to-hyperdynamic LV function and normal prosthesis function with only mildly elevated gradients (study is limited by extreme tachycardia). Paradoxical septal motion due to LBBB.  PLAN: Continue with amiodarone for rate/rhythm control, add beta blocker, continue IV heparin, Will review the full echo, but I do not think that there is any new development as far as underlying cardiac illness other than the AFib.  Thurmon Fair, MD, [facility] [phone] 09/22/2018, 10:35 AM

## 2018-09-22 NOTE — Progress Notes (Signed)
ANTICOAGULATION CONSULT NOTE - Follow Up Consult  Pharmacy Consult for Heparin Indication: atrial fibrillation  Allergies  Allergen Reactions  . Amoxicillin Other (See Comments)    Patient tolerated Ancef in May and Zinacef in March 2017. Did it involve swelling of the face/tongue/throat, SOB, or low BP? Unknown Did it involve sudden or severe rash/hives, skin peeling, or any reaction on the inside of your mouth or nose? Unknown Did you need to seek medical attention at a hospital or doctor's office? Unknown When did it last happen?unk If all above answers are "NO", may proceed with cephalosporin use.   Marland Kitchen Zetia [Ezetimibe] Other (See Comments)    DIZZINESS    Patient Measurements: Height: 5' (152.4 cm) Weight: 135 lb (61.2 kg) IBW/kg (Calculated) : 45.5 Heparin Dosing Weight: 61.2 kg  Vital Signs: Temp: 98.6 F (37 C) (03/18 0423) BP: 104/81 (03/18 1000) Pulse Rate: 63 (03/18 1000)  Labs: Recent Labs    09/21/18 1637 09/21/18 2226 09/22/18 0313 09/22/18 0909  HGB 9.9*  --  9.3*  --   HCT 33.8*  --  29.9*  --   PLT 290  --  259  --   HEPARINUNFRC  --   --   --  0.67  CREATININE 1.29*  --  1.25*  --   TROPONINI  --  0.09* 0.07*  --     Estimated Creatinine Clearance: 27.9 mL/min (A) (by C-G formula based on SCr of 1.25 mg/dL (H)).  Assessment:   85 yr old female on IV heparin for new onset atrial fibrillation.       Initial heparin level is therapeutic (0.67) on 850 units/hr.   Hgb low stable.  Iron studies low.  Goal of Therapy:  Heparin level 0.3-0.7 units/ml Monitor platelets by anticoagulation protocol: Yes   Plan:   Continue heparin drip at 850 units/hr.  Daily heparin level and CBC.  Dennie Fetters , [place] Pager: [phone] or phone: [phone] 09/22/2018,11:12 AM

## 2018-09-22 NOTE — Progress Notes (Signed)
Paged Dr Criss Alvine concerning cardiology note that recommended COVID-19 testing, Flu swab sent at 1830- Resp panel not ordered until 2300. Per MD awaiting Resp panel first and continue droplet precautions for now.

## 2018-09-22 NOTE — Progress Notes (Signed)
Pt BP 82/62, HR 120. Pt asymptomatic. Jeraldine Loots, NP notified of low BP.  Orders received: Continue Amiodarone gtt, will decrease metoprolol dosage, monitor closely.

## 2018-09-23 ENCOUNTER — Ambulatory Visit (HOSPITAL_COMMUNITY): Admission: RE | Admit: 2018-09-23 | Payer: Medicare HMO | Source: Home / Self Care | Admitting: Pulmonary Disease

## 2018-09-23 ENCOUNTER — Ambulatory Visit (HOSPITAL_COMMUNITY): Payer: Medicare HMO

## 2018-09-23 ENCOUNTER — Encounter (HOSPITAL_COMMUNITY): Admission: RE | Payer: Self-pay | Source: Home / Self Care

## 2018-09-23 DIAGNOSIS — H409 Unspecified glaucoma: Secondary | ICD-10-CM

## 2018-09-23 DIAGNOSIS — H02401 Unspecified ptosis of right eyelid: Secondary | ICD-10-CM

## 2018-09-23 DIAGNOSIS — I5023 Acute on chronic systolic (congestive) heart failure: Secondary | ICD-10-CM

## 2018-09-23 DIAGNOSIS — N179 Acute kidney failure, unspecified: Secondary | ICD-10-CM

## 2018-09-23 LAB — CBC
HCT: 29 % — ABNORMAL LOW (ref 36.0–46.0)
Hemoglobin: 8.8 g/dL — ABNORMAL LOW (ref 12.0–15.0)
MCH: 23 pg — ABNORMAL LOW (ref 26.0–34.0)
MCHC: 30.3 g/dL (ref 30.0–36.0)
MCV: 75.7 fL — ABNORMAL LOW (ref 80.0–100.0)
Platelets: 258 10*3/uL (ref 150–400)
RBC: 3.83 MIL/uL — ABNORMAL LOW (ref 3.87–5.11)
RDW: 18.7 % — ABNORMAL HIGH (ref 11.5–15.5)
WBC: 12.7 10*3/uL — ABNORMAL HIGH (ref 4.0–10.5)
nRBC: 0.3 % — ABNORMAL HIGH (ref 0.0–0.2)

## 2018-09-23 LAB — BASIC METABOLIC PANEL
Anion gap: 13 (ref 5–15)
BUN: 30 mg/dL — ABNORMAL HIGH (ref 8–23)
CO2: 23 mmol/L (ref 22–32)
Calcium: 9.3 mg/dL (ref 8.9–10.3)
Chloride: 99 mmol/L (ref 98–111)
Creatinine, Ser: 1.83 mg/dL — ABNORMAL HIGH (ref 0.44–1.00)
GFR calc Af Amer: 29 mL/min — ABNORMAL LOW (ref >60)
GFR calc non Af Amer: 25 mL/min — ABNORMAL LOW (ref >60)
Glucose, Bld: 189 mg/dL — ABNORMAL HIGH (ref 70–99)
Potassium: 4.4 mmol/L (ref 3.5–5.1)
Sodium: 135 mmol/L (ref 135–145)

## 2018-09-23 LAB — URINE CULTURE: CULTURE: NO GROWTH

## 2018-09-23 LAB — HEPARIN LEVEL (UNFRACTIONATED): Heparin Unfractionated: 0.57 IU/mL (ref 0.30–0.70)

## 2018-09-23 SURGERY — VIDEO BRONCHOSCOPY WITHOUT FLUORO
Anesthesia: Moderate Sedation | Laterality: Bilateral

## 2018-09-23 MED ORDER — FUROSEMIDE 10 MG/ML IJ SOLN: 80.0000 mg | Freq: Two times a day (BID) | INTRAMUSCULAR | Status: DC

## 2018-09-23 MED ORDER — AMIODARONE HCL IN DEXTROSE 360-4.14 MG/200ML-% IV SOLN: 30.0000 mg/h | INTRAVENOUS | Status: DC

## 2018-09-23 MED ORDER — FUROSEMIDE 10 MG/ML IJ SOLN
80.0000 mg | Freq: Two times a day (BID) | INTRAMUSCULAR | Status: DC
  Administered 2018-09-23 (×2): 80 mg via INTRAVENOUS
  Filled 2018-09-23 (×2): qty 8

## 2018-09-23 MED ORDER — AMIODARONE HCL IN DEXTROSE 360-4.14 MG/200ML-% IV SOLN
30.0000 mg/h | INTRAVENOUS | Status: DC
  Administered 2018-09-23 – 2018-09-24 (×2): 30 mg/h via INTRAVENOUS
  Filled 2018-09-23 (×4): qty 200

## 2018-09-23 NOTE — Progress Notes (Signed)
Infection prevention contacted Dr Drue Second with the results of the COVID-19 test that resulted negative and patient was taken off precautions for the virus. Dr Drue Second agreed that the patient was low risk and the test may not have been warranted.

## 2018-09-23 NOTE — Progress Notes (Signed)
ANTICOAGULATION CONSULT NOTE - Follow Up Consult  Pharmacy Consult for Heparin Indication: atrial fibrillation  Allergies  Allergen Reactions  . Amoxicillin Other (See Comments)    Patient tolerated Ancef in May and Zinacef in March 2017. Did it involve swelling of the face/tongue/throat, SOB, or low BP? Unknown Did it involve sudden or severe rash/hives, skin peeling, or any reaction on the inside of your mouth or nose? Unknown Did you need to seek medical attention at a hospital or doctor's office? Unknown When did it last happen?unk If all above answers are "NO", may proceed with cephalosporin use.   Marland Kitchen Zetia [Ezetimibe] Other (See Comments)    DIZZINESS    Patient Measurements: Height: 5' (152.4 cm) Weight: 134 lb 0.6 oz (60.8 kg) IBW/kg (Calculated) : 45.5 Heparin Dosing Weight: 61.2 kg  Vital Signs: Temp: 98.6 F (37 C) (03/19 0533) Temp Source: Oral (03/19 0533) BP: 102/87 (03/19 0533) Pulse Rate: 119 (03/19 0533)  Labs: Recent Labs    09/21/18 1637 09/21/18 2226 09/22/18 0313 09/22/18 0909 09/23/18 0648  HGB 9.9*  --  9.3*  --  8.8*  HCT 33.8*  --  29.9*  --  29.0*  PLT 290  --  259  --  258  HEPARINUNFRC  --   --   --  0.67  --   CREATININE 1.29*  --  1.25*  --   --   TROPONINI  --  0.09* 0.07*  --   --     Estimated Creatinine Clearance: 27.8 mL/min (A) (by C-G formula based on SCr of 1.25 mg/dL (H)).  Assessment: 72 yoF with new onset AFib RVR and HFpEF. Pt started on IV heparin. Hgb trending down slowly, pltc stable. Heparin level is therapeutic at 0.57.  Goal of Therapy:  Heparin level 0.3-0.7 units/ml Monitor platelets by anticoagulation protocol: Yes   Plan:  -Heparin 850 units/hr -Monitor S/Sx bleeding and heparin level/CBC stable  Fredonia Highland, PharmD, BCPS Clinical Pharmacist [phone] Please check AMION for all [facility] numbers 09/23/2018

## 2018-09-23 NOTE — Progress Notes (Signed)
Subjective: Still does not feel well, says her breathing is slightly better today, but is still very tired.  Yesterday she had a period of hypotension after being started on metoprolol 25 mg and felt dizzy.  Her BP eventually improved and the dose of metoprolol was decreased to 12.5 mg twice daily.  Objective:  Vital signs in last 24 hours: Vitals:   09/22/18 2120 09/23/18 0500 09/23/18 0533 09/23/18 0800  BP: 117/78  102/87   Pulse: (!) 140  (!) 119 (!) 102  Resp: 17  (!) 26 18  Temp:   98.6 F (37 C)   TempSrc:   Oral   SpO2: 97%  93% 99%  Weight:  60.8 kg    Height:       General: Appears tired, mild respiratory distress HEENT: No rhinorrhea, right eye ptosis Neck: JVP to the mid neck sitting upright Cardiac: Tachycardic, irregularly irregular, HR 120s, murmur difficult to discern given tachycardia and irregularity, possible systolic murmur at right upper sternal border Pulmonary: Respiratory rate approximately 18, mild accessory muscle use, diminished in both bases with few faint crackles Abdomen: Soft, nontender, normal bowel sounds Extremities: Variable pulses, warm, brisk cap refill  Significant test results: Creatinine 1.8 (1.3 yesterday) WBC 12.7 (13.4) Hgb 8.8 (9.3) UA with 6-10 WBCs, negative nitrites Urine culture pending Blood cultures NGTD  TTE showing moderately reduced LVEF 40 to 45%, severe akinesis of the anteroseptal wall, moderately dilated LV, RV with normal systolic function and mildly elevated pressures, bioprosthetic aortic valve appears well-positioned and functioning normally.  Evaluation is limited by significant tachycardia.  Assessment/Plan:  Principal Problem:   Atrial fibrillation with RVR (HCC) Active Problems:   Shortness of breath   S/P TAVR 2016   CKD (chronic kidney disease) stage 3, GFR 30-59 ml/min (HCC)   Acute heart failure (HCC)   Fever, unspecified  This is an 85 year old woman with history of CAD with prior CABG, aortic  stenosis with AVR in 2009 and TAVR in 2016, CKD stage III, and recent treatment for possible atypical pneumonia admitted for chest pain and shortness of breath found to have new onset acute heart failure and atrial fibrillation with RVR.  # Atrial fibrillation with RVR:  Still unclear if A. fib was triggered by acute CHF, or possibly infection, or whether A. fib led to CHF.    Rate control has improved slightly, but HR still predominantly 120-130.  Became hypotensive yesterday with metoprolol 25 mg, decreased to 12.5 mg twice daily.  Continues on amiodarone.  Unfortunately, digoxin is not a great option given her impaired renal function. - Amiodarone drip, may need to increase - Metoprolol 12.5 mg twice daily - Telemetry, will watch HR closely, target HR <110 - Started on heparin drip for anticoagulation, will transition to oral meds when appropriate  # Acute decompensated HFrEF: Unclear etiology, possibly tachycardia induced, also has significant history of CAD with CABG with significant wall motion abnormalities on TTE.  No signs of active ischemia at this time on EKG, troponins very mildly elevated likely related to demand.  Valve function appears to be preserved.  She may benefit from repeat TTE when we have her better rate controlled.  Does not appear we have made much progress yet with her volume status, she likely has a fair amount of diuretic resistance with her CKD - Continue strict I/O, daily weight, net even from yesterday - Increase furosemide to 80 mg twice daily given her impaired renal function and age - Will wean oxygen as  able today for O2 sat >92% - Continue home aspirin and atorvastatin  # Fever and leukocytosis:  Still no clear explanation for her fever, but she has not had recurrent fever.  Overall leukocytosis trend continues to improve.  As discussed extensively yesterday, low suspicion for COVID-19, but awaiting test results.  Urine not suggestive of infection, urine culture  and blood cultures still pending. - We will continue to observe off of antibiotics  # Acute kidney injury on CKD stage III: BUN and creatinine have increased today, given her overall clinical picture of continued volume overload and the episode of hypotension yesterday after initiating metoprolol, suspect this is either ATN or cardiorenal syndrome with poor renal perfusion.  She does not have evidence of hypovolemia.  We will continue to treat her cardiac conditions as above and hopefully her renal function will improve as we perfuse her kidneys better.  No indication to hold diuretics at this time.  # Microcytic anemia: History of GI bleed from gastric ulcer requiring transfusion in 2017.  Current testing is consistent with iron deficiency with low normal ferritin, low iron, and low transferrin saturation. 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   Anne Shutter, MD 09/23/2018, 10:46 AM

## 2018-09-23 NOTE — Progress Notes (Addendum)
Progress Note  Patient Name: [name] Date of Encounter: 09/23/2018  Primary Cardiologist: [name], MD   Subjective   Pt states "I just don't feel good". She is still short of breath with activity, about the same as yesterday. No chest discomfort, palpitations or lightheadedness.   Inpatient Medications    Scheduled Meds: . aspirin EC  81 mg Oral Daily  . atorvastatin  80 mg Oral Daily  . cholecalciferol  2,000 Units Oral Daily  . furosemide  80 mg Intravenous BID  . gabapentin  100 mg Oral TID  . ketorolac  1 drop Right Eye BID  . metoprolol tartrate  12.5 mg Oral BID  . pantoprazole  40 mg Oral Daily  . sodium chloride flush  3 mL Intravenous Q12H  . sodium chloride flush  3 mL Intravenous Q12H  . timolol  1 drop Both Eyes BID   Continuous Infusions: . amiodarone 30 mg/hr (09/23/18 0703)  . heparin 850 Units/hr (09/22/18 2130)   PRN Meds: acetaminophen **OR** acetaminophen, nitroGLYCERIN, senna-docusate, sodium chloride flush   Vital Signs    Vitals:   09/22/18 2120 09/23/18 0500 09/23/18 0533 09/23/18 0800  BP: 117/78  102/87   Pulse: (!) 140  (!) 119 (!) 102  Resp: 17  (!) 26 18  Temp:   98.6 F (37 C)   TempSrc:   Oral   SpO2: 97%  93% 99%  Weight:  60.8 kg    Height:        Intake/Output Summary (Last 24 hours) at 09/23/2018 1030 Last data filed at 09/23/2018 1610 Gross per 24 hour  Intake 1006 ml  Output 1000 ml  Net 6 ml   Last 3 Weights 09/23/2018 09/22/2018 09/21/2018  Weight (lbs) 134 lb 0.6 oz 135 lb 135 lb  Weight (kg) 60.8 kg 61.236 kg 61.236 kg      Telemetry    afib in the 120's-130's - Personally Reviewed  ECG    No new tracings - Personally Reviewed  Physical Exam   GEN: No acute distress.   Neck: No JVD Cardiac: irregularly irregular fast rhythm, no murmurs, rubs, or gallops.  Respiratory: Clear to auscultation bilaterally. GI: Soft, nontender, non-distended  MS: No edema; No deformity. Neuro:  Nonfocal   Psych: Normal affect   Labs    Chemistry Recent Labs  Lab 09/21/18 1637 09/22/18 0313 09/23/18 0920  NA 134* 136 135  K 3.2* 3.6 4.4  CL 100 98 99  CO2 GLUCOSE 132* 111* 189*  BUN 21 20 30*  CREATININE 1.29* 1.25* 1.83*  CALCIUM 9.3 9.5 9.3  PROT 6.5  --   --   ALBUMIN 3.1*  --   --   AST 17  --   --   ALT 14  --   --   ALKPHOS 88  --   --   BILITOT 1.6*  --   --   GFRNONAA 38* 40* 25*  GFRAA 44* 46* 29*  ANIONGAP Hematology Recent Labs  Lab 09/21/18 1637 09/22/18 0313 09/23/18 0648  WBC 16.0* 13.4* 12.7*  RBC 4.41 3.98 3.83*  HGB 9.9* 9.3* 8.8*  HCT 33.8* 29.9* 29.0*  MCV 76.6* 75.1* 75.7*  MCH 22.4* 23.4* 23.0*  MCHC 29.3* 31.1 30.3  RDW 18.6* 18.5* 18.7*  PLT 290 259 258    Cardiac Enzymes Recent Labs  Lab 09/21/18 2226 09/22/18 0313  TROPONINI 0.09* 0.07*    Recent  Labs  Lab 09/21/18 1657 09/21/18 2032  TROPIPOC 0.08 0.10*     BNP Recent Labs  Lab 09/21/18 1649  BNP 1,334.7*     DDimer No results for input(s): DDIMER in the last 168 hours.   Radiology    Dg Chest 2 View  Result Date: 09/21/2018 CLINICAL DATA:  Shortness of breath EXAM: CHEST - 2 VIEW COMPARISON:  09/12/2018, 09/06/2018, 08/27/2018 FINDINGS: Postsurgical changes of the lower cervical spine. Post sternotomy changes and valve prosthesis. Hyperinflation. Diffuse increased reticular opacity. Mild cardiomegaly with aortic atherosclerosis. No pneumothorax. Slight increased loss of height of midthoracic vertebra IMPRESSION: 1. Diffuse increased interstitial opacity bilaterally, which may be secondary to diffuse interstitial inflammatory process or mild interstitial edema. 2. Mild cardiomegaly. 3. Chronic compression fracture of midthoracic vertebra but with slight increased loss of vertebral height since 09/06/2018. Electronically Signed   By: Jasmine Pang M.D.   On: 09/21/2018 15:24    Cardiac Studies   Echocardiogram 09/22/2018 IMPRESSIONS  1.  Severe akinesis of the left ventricular, entire anteroseptal wall.  2. The left ventricle has mild-moderately reduced systolic function, with an ejection fraction of 40-45%. The cavity size was mild to moderately dilated. There is moderately increased left ventricular wall thickness.  3. The patient is very tachycardic so assessment of the LV function is difficult. There is moderate LVH and the LV cavity appears to be small.  4. The right ventricle has normal systolc function. The cavity was normal. Right ventricular systolic pressure is mildly elevated.  5. A Edwards Sapien bioprosthetic aortic valve (TAVR) valve is present in the aortic position.  6. No stenosis of the aortic valve.  FINDINGS  Left Ventricle: The left ventricle has mild-moderately reduced systolic function, with an ejection fraction of 40-45%. The cavity size was mild to moderately dilated. There is moderately increased left ventricular wall thickness. Severe akinesis of the  left ventricular, entire anteroseptal wall. The patient is very tachycardic so assessment of the LV function is difficult. There is moderate LVH and the LV cavity appears to be small. Right Ventricle: The right ventricle has normal systolic function. The cavity was normal. Right ventricular systolic pressure is mildly elevated. Mitral Valve: The mitral valve is normal in structure. Mitral valve regurgitation is mild by color flow Doppler. Aortic Valve: The aortic valve has been repaired/replaced Aortic valve regurgitation was not visualized by color flow Doppler. There is no stenosis of the aortic valve. A Edwards Sapien bioprosthetic, stented aortic valve (TAVR) valve is present in the  aortic position.    Echocardiogram 03/24/18 Study Conclusions - Left ventricle: The cavity size was normal. There was moderate concentric hypertrophy. Systolic function was vigorous. The estimated ejection fraction was in the range of 65% to 70%. Wall motion was  normal; there were no regional wall motion abnormalities. Doppler parameters are consistent with abnormal left ventricular relaxation (grade 1 diastolic dysfunction). The E/e&' ratio is >15, suggesting elevated LV filling pressure. - Aortic valve: s/p 20 mm Edwards Sapien THV. No obstruction or paravalvular leak. Mean gradient (S): 23 mm Hg. Peak gradient (S): 48 mm Hg. Valve area (VTI): 1.33 cm^2. Valve area (Vmax): 1.2 cm^2. Valve area (Vmean): 1.33 cm^2. - Ascending aorta: The ascending aorta was moderately dilated at 4.66 cm. - Mitral valve: Calcified annulus. Mildly thickened leaflets . Mild stenosis. There was trivial regurgitation. Valve area by pressure half-time: 2.5 cm^2. - Left atrium: The atrium was normal in size. - Right ventricle: The cavity size was mildly dilated. Mildly reduced systolic  function. - Right atrium: The atrium was mildly dilated. - Inferior vena cava: The vessel was normal in size. The respirophasic diameter changes were in the normal range (>= 50%), consistent with normal central venous pressure.  Impressions: - Compared to a prior study in 2018, the gradients across the TAVR valve are increased, however, LVEF is higher at 65-70%. LV filling pressure is elevated. The ascending aorta is moderately dilated to 4.6 cm.  Patient Profile     85 y.o. 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 Pt with fever of 101.7. BNP 1300.   Assessment & Plan    Atrial fibrillation, new onset -Pt admitted with shortness of breath, fever. Hx of bilateral lung infiltrates suggestive of MAI.  -Due to soft BP, pt was started on amiodarone IV load but rate has been poorly  controlled. Metoprolol 25 mg was added yesterday with subsequent drop in BP into the 80's. Metoprolol dose decreased to 12.5 mg. BP better today. Not much improvement in heart rate. Renal function prohibits addition of digoxin. Will increase amiodarone rate back to 60mg /hr.  -Pt on IV heparin for anticoagulation for CHA2DS2/VAS Stroke Risk score 5 (CHF, vasc dz, age (2), female). Plan to transition to DOAC, eliquis prior to discharge. Dosing based on renal function once it settles in.   Acute systolic heart failure -Possibly related to tachyarrhythmia  -Echo shows EF down to 40-45% (prior 65-70% in 03/2018). Moderately dilated LV, severe akinesis of the LV, entire anteroseptal wall. Moderate LVH and the LV cavity appears small.  -Lasix increased to 80 mg IV BID. 1L UOP yesterday. Prior UOP was not accurate due to poorly functioning Purewick.  -Wt is down 1 pound from yesterday.   Fever and leukocytosis -Unclear etiology. Pt has hx of emphysema and bilateral pulmonary infiltrates suggestive of MAI. Was planned for lung bx per pulmonology as outpt. ID has notified her pulmonologist of her hospitalization.  -ID does not feel that this is COVID 19. Still awaiting test results.  -pt complained of dysuria and urine studies in process.  -Currently afebrile.  CAD s/p CABG x4 2009 -Patent grafts at cath 03/2016. -No chest pain.   CKD stage III -SCr was stable in the 1.2 range. SCr up to 1.83 today possibly related to hypotension with hypoperfusion.  -Continue to follow renal function closely with diuresis.   Hypertension -BP was down to the 80's yesterday after metoprolol was added. Dose cut in half and BP better today.   Hyperlipidemia -On atorvastatin 80 mg daily. LDL was 65 in 01/2018, at goal of <70.   AS s/p AVR 2009 -No stenosis on echo.     For questions or updates, please contact [facility] Please consult [email or web address] for contact info under        Signed, Berton Bon, NP   09/23/2018, 10:30 AM    I have seen and examined the patient along with Berton Bon, NP .  I have reviewed the chart, notes and new data.  I agree with PA/NP's note.  Key new complaints: no improvement in dyspnea, denies angina Key examination changes: remains in AFib w RVR, transient hypotension last night Key new findings / data: worsened renal parameters  PLAN: Had to reduce the beta blocker dose due to hypotension. Not a good  candidate for digoxin with worsening renal function. Temporarily increase the dose of IV amiodarone for another 6 hour period, then go back to maintenance IV dose until we achieve acceptable rate control.  Thurmon Fair, MD, [facility] [phone] 09/23/2018, 11:08 AM

## 2018-09-24 DIAGNOSIS — R011 Cardiac murmur, unspecified: Secondary | ICD-10-CM

## 2018-09-24 LAB — CBC
HCT: 29.2 % — ABNORMAL LOW (ref 36.0–46.0)
Hemoglobin: 8.8 g/dL — ABNORMAL LOW (ref 12.0–15.0)
MCH: 22.7 pg — ABNORMAL LOW (ref 26.0–34.0)
MCHC: 30.1 g/dL (ref 30.0–36.0)
MCV: 75.5 fL — ABNORMAL LOW (ref 80.0–100.0)
Platelets: 320 10*3/uL (ref 150–400)
RBC: 3.87 MIL/uL (ref 3.87–5.11)
RDW: 18.8 % — ABNORMAL HIGH (ref 11.5–15.5)
WBC: 14.3 10*3/uL — ABNORMAL HIGH (ref 4.0–10.5)
nRBC: 0.5 % — ABNORMAL HIGH (ref 0.0–0.2)

## 2018-09-24 LAB — BASIC METABOLIC PANEL
Anion gap: 11 (ref 5–15)
BUN: 37 mg/dL — ABNORMAL HIGH (ref 8–23)
CO2: 27 mmol/L (ref 22–32)
Calcium: 9.2 mg/dL (ref 8.9–10.3)
Chloride: 96 mmol/L — ABNORMAL LOW (ref 98–111)
Creatinine, Ser: 1.85 mg/dL — ABNORMAL HIGH (ref 0.44–1.00)
GFR calc Af Amer: 29 mL/min — ABNORMAL LOW (ref >60)
GFR calc non Af Amer: 25 mL/min — ABNORMAL LOW (ref >60)
Glucose, Bld: 124 mg/dL — ABNORMAL HIGH (ref 70–99)
Potassium: 3.8 mmol/L (ref 3.5–5.1)
Sodium: 134 mmol/L — ABNORMAL LOW (ref 135–145)

## 2018-09-24 LAB — HEPARIN LEVEL (UNFRACTIONATED): Heparin Unfractionated: 0.48 IU/mL (ref 0.30–0.70)

## 2018-09-24 MED ORDER — METOPROLOL TARTRATE 12.5 MG HALF TABLET
12.5000 mg | ORAL_TABLET | Freq: Three times a day (TID) | ORAL | Status: DC
  Administered 2018-09-24 (×2): 12.5 mg via ORAL
  Filled 2018-09-24 (×3): qty 1

## 2018-09-24 MED ORDER — FUROSEMIDE 10 MG/ML IJ SOLN
160.0000 mg | Freq: Two times a day (BID) | INTRAVENOUS | Status: DC
  Administered 2018-09-24 (×2): 160 mg via INTRAVENOUS
  Filled 2018-09-24: qty 16
  Filled 2018-09-24: qty 10
  Filled 2018-09-24: qty 16

## 2018-09-24 NOTE — Care Management Important Message (Signed)
Important Message  Patient Details  Name: Nyah E Duffin MRN: [id number] Date of Birth: 1933-04-17   Medicare Important Message Given:  Yes    Myrla Malanowski 09/24/2018, 3:52 PM

## 2018-09-24 NOTE — Progress Notes (Signed)
Subjective: [name] reported that she was feeling better today, no acute events overnight.  She states that her breathing has improved since admission.  She reports that she has been urinating well, use the restroom about 3 times last night.  She denied any palpitations, chest pain, or any other symptoms today.  Reports that she slept well overnight.  We discussed that her heart rate is still been elevated, and that we are having difficulty with controlling this due to her limited options.  We discussed that we will continue to diurese her.  All questions were answered.  Objective:  Vital signs in last 24 hours: Vitals:   09/23/18 2029 09/23/18 2248 09/24/18 0022 09/24/18 0453  BP: 108/76 (!) 107/92 (!) 98/59 101/67  Pulse: 93 (!) 131  (!) 135  Resp:      Temp: 97.9 F (36.6 C)  98.4 F (36.9 C) 98.4 F (36.9 C)  TempSrc: Oral  Oral   SpO2: 99%   99%  Weight:    61.7 kg  Height:        General: Elderly female, no acute distress, pleasant, speaking in full sentences Cardiac: Tachycardic, irregularly irregular, no appreciable murmurs rubs or gallops, JVD to the mid neck when sitting up Pulmonary: CTA BL, decreased breath sounds, no wheezing or rhonchi Abdomen: Soft, nontender, nondistended Extremity: No LE edema, good pulses, warm extremities   Assessment/Plan:  Principal Problem:   Atrial fibrillation with RVR (HCC) Active Problems:   Shortness of breath   S/P TAVR 2016   CKD (chronic kidney disease) stage 3, GFR 30-59 ml/min (HCC)   Acute heart failure (HCC)   Fever, unspecified  This is an 85 year old female with a history of CAD status post CABG, aortic stenosis status post AVR in 2009 and TAVR in 2016, CKD stage III, and recent treatment for possible atypical pneumonia who came in with chest pain, shortness of breath, and found to have new onset heart failure with atrial fibrillation with RVR.  Atrial fibrillation with RVR: -Patient home with a new onset of atrial  fibrillation that was noted at her PCP, unclear atrial fibrillation or CHF came first.  She is still having elevated heart rate yesterday and amiodarone drip was increased.  She is still not rate controlled, heart rate consistently above 110.  She is still on the amiodarone drip, metoprolol 12.5 mg twice daily.  Digoxin is not a good option due to her kidney disease. Question if cardioversion would be a possibility for him, appreciate cardiology recommendations.  -Cardiology following, appreciate assistance -Continue amiodarone drip, may need to increase this again -Continue metoprolol 12.5 mg twice daily -Continue telemetry, target heart rate less than 110 -Continue heparin drip, will need to transition to oral medications when appropriate  New onset acute systolic heart failure: -Possibly secondary to atrial fibrillation however unclear which came first.  Her TTE showed decreased EF of 40 to 45%, severe akinesis of the anteroseptal wall, dilated LV, and bioprosthetic aortic valve is well positioned and functioning normally, evaluation was limited by tachycardia.  She has been being diuresed however this has been difficult due to her significant tachycardia 2/2 atrial fibrillation.  We had increased her Lasix to 80 mg twice daily yesterday, weight today was 61.7 kg, actually up from 60.8 kg yesterday.  No output was recorded, however patient reported that she has been urinating frequently.  Creatinine is still elevated but stable from yesterday at 1.8. -Increase Lasix to 160 mg twice daily -Daily weights -Strict I's  and O's -Supplemental oxygen as needed, wean as tolerated -Continue home aspirin and atorvastatin  Fever and leukocytosis: -Patient has been afebrile however still having a leukocytosis of 14.  Coag testing came back negative today.  Blood cultures and urine cultures have been NGTD.  Is unclear what is causing her persistent leukocytosis.  No obvious sources of infection at this time.  -Follow-up blood cultures -Daily CBC -May consider starting antibiotics if fevers recur  AoCKD stage 3: -BUN and creatinine are still elevated, creatinine is stable from yesterday.  She is still volume overloaded and is also still in A. fib with RVR, she may be having decreased renal perfusion, other possibilities include ATN or cardiorenal syndrome.  We will continue to monitor her creatinine function is been increase her diuresis. -BMP in a.m.  Microcytic anemia: -Patient had a history of GI bleed, no evidence of bleeding at this time.  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   [name], MD 09/24/2018, 10:12 AM Pager: [phone]

## 2018-09-24 NOTE — Progress Notes (Signed)
Clarification from previous note completed by Janett Labella on 3/19. Patients COVID-19 test has not resulted. IP Specialist confirmed with Dr. Drue Second that this patient was inappropriately tested and COVID-19 isolation was not required.

## 2018-09-24 NOTE — Progress Notes (Signed)
ANTICOAGULATION CONSULT NOTE - Follow Up Consult  Pharmacy Consult for Heparin Indication: atrial fibrillation  Allergies  Allergen Reactions  . Amoxicillin Other (See Comments)    Patient tolerated Ancef in May and Zinacef in March 2017. Did it involve swelling of the face/tongue/throat, SOB, or low BP? Unknown Did it involve sudden or severe rash/hives, skin peeling, or any reaction on the inside of your mouth or nose? Unknown Did you need to seek medical attention at a hospital or doctor's office? Unknown When did it last happen?unk If all above answers are "NO", may proceed with cephalosporin use.   Marland Kitchen Zetia [Ezetimibe] Other (See Comments)    DIZZINESS    Patient Measurements: Height: 5' (152.4 cm) Weight: 136 lb 0.4 oz (61.7 kg) IBW/kg (Calculated) : 45.5 Heparin Dosing Weight: 61.2 kg  Vital Signs: Temp: 98.4 F (36.9 C) (03/20 0453) Temp Source: Oral (03/20 0022) BP: 101/67 (03/20 0453) Pulse Rate: 135 (03/20 0453)  Labs: Recent Labs    09/21/18 2226 09/22/18 0313 09/22/18 0909 09/23/18 0648 09/23/18 0920 09/24/18 0304  HGB  --  9.3*  --  8.8*  --  8.8*  HCT  --  29.9*  --  29.0*  --  29.2*  PLT  --  259  --  258  --  320  HEPARINUNFRC  --   --  0.67 0.57  --  0.48  CREATININE  --  1.25*  --   --  1.83* 1.85*  TROPONINI 0.09* 0.07*  --   --   --   --     Estimated Creatinine Clearance: 18.9 mL/min (A) (by C-G formula based on SCr of 1.85 mg/dL (H)).  Assessment: 31 yoF with new onset AFib RVR and HFpEF. Pt started on IV heparin. Heparin levels are therapeutic at 0.48, H/H is stable today.  Goal of Therapy:  Heparin level 0.3-0.7 units/ml Monitor platelets by anticoagulation protocol: Yes   Plan:  -Heparin 850 units/hr -Monitor S/Sx bleeding and heparin level/CBC stable   Fredonia Highland, PharmD, BCPS Clinical Pharmacist [phone] Please check AMION for all [facility] numbers 09/24/2018

## 2018-09-24 NOTE — Progress Notes (Signed)
Progress Note  Patient Name: [name] Date of Encounter: 09/24/2018  Primary Cardiologist: [name], MD   Subjective   She is feeling better. Ventricular rate control is slightly improved, but remains inadequate. Blood pressure is marginally better. Renal function remains abnormal, but unchanged from yesterday.   Inpatient Medications    Scheduled Meds:  aspirin EC  81 mg Oral Daily   atorvastatin  80 mg Oral Daily   cholecalciferol  2,000 Units Oral Daily   gabapentin  100 mg Oral TID   ketorolac  1 drop Right Eye BID   metoprolol tartrate  12.5 mg Oral BID   pantoprazole  40 mg Oral Daily   sodium chloride flush  3 mL Intravenous Q12H   sodium chloride flush  3 mL Intravenous Q12H   timolol  1 drop Both Eyes BID   Continuous Infusions:  amiodarone 30 mg/hr (09/23/18 2255)   furosemide 160 mg (09/24/18 0914)   heparin 850 Units/hr (09/24/18 0027)   PRN Meds: acetaminophen **OR** acetaminophen, nitroGLYCERIN, senna-docusate, sodium chloride flush   Vital Signs    Vitals:   09/23/18 2029 09/23/18 2248 09/24/18 0022 09/24/18 0453  BP: 108/76 (!) 107/92 (!) 98/59 101/67  Pulse: 93 (!) 131  (!) 135  Resp:      Temp: 97.9 F (36.6 C)  98.4 F (36.9 C) 98.4 F (36.9 C)  TempSrc: Oral  Oral   SpO2: 99%   99%  Weight:    61.7 kg  Height:        Intake/Output Summary (Last 24 hours) at 09/24/2018 1045 Last data filed at 09/23/2018 2255 Gross per 24 hour  Intake 882.02 ml  Output --  Net 882.02 ml   Last 3 Weights 09/24/2018 09/23/2018 09/22/2018  Weight (lbs) 136 lb 0.4 oz 134 lb 0.6 oz 135 lb  Weight (kg) 61.7 kg 60.8 kg 61.236 kg      Telemetry    Atrial fibrillation with rapid ventricular response- Personally Reviewed  ECG    No new tracing- Personally Reviewed  Physical Exam  Appears more comfortable GEN: No acute distress.   Neck: No JVD Cardiac:  Irregular,-3/6 early peaking systolic ejection murmur in the aortic focus  , no diastolic murmurs, rubs, or gallops.  Respiratory: Clear to auscultation bilaterally. GI: Soft, nontender, non-distended  MS: No edema; No deformity. Neuro:  Nonfocal  Psych: Normal affect   Labs    Chemistry Recent Labs  Lab 09/21/18 1637 09/22/18 0313 09/23/18 0920 09/24/18 0304  NA 134* 136 135 134*  K 3.2* 3.6 4.4 3.8  CL 100 98 99 96*  CO2 24 26 23 27   GLUCOSE 132* 111* 189* 124*  BUN 21 20 30* 37*  CREATININE 1.29* 1.25* 1.83* 1.85*  CALCIUM 9.3 9.5 9.3 9.2  PROT 6.5  --   --   --   ALBUMIN 3.1*  --   --   --   AST 17  --   --   --   ALT 14  --   --   --   ALKPHOS 88  --   --   --   BILITOT 1.6*  --   --   --   GFRNONAA 38* 40* 25* 25*  GFRAA 44* 46* 29* 29*  ANIONGAP 10 12 13 11      Hematology Recent Labs  Lab 09/22/18 0313 09/23/18 0648 09/24/18 0304  WBC 13.4* 12.7* 14.3*  RBC 3.98 3.83* 3.87  HGB 9.3* 8.8* 8.8*  HCT 29.9*  29.0* 29.2*  MCV 75.1* 75.7* 75.5*  MCH 23.4* 23.0* 22.7*  MCHC 31.1 30.3 30.1  RDW 18.5* 18.7* 18.8*  PLT 259 258 320    Cardiac Enzymes Recent Labs  Lab 09/21/18 2226 09/22/18 0313  TROPONINI 0.09* 0.07*    Recent Labs  Lab 09/21/18 1657 09/21/18 2032  TROPIPOC 0.08 0.10*     BNP Recent Labs  Lab 09/21/18 1649  BNP 1,334.7*     DDimer No results for input(s): DDIMER in the last 168 hours.   Radiology    No results found.  Cardiac Studies   ECHO 09/22/2018   1. Severe akinesis of the left ventricular, entire anteroseptal wall.  2. The left ventricle has mild-moderately reduced systolic function, with an ejection fraction of 40-45%. The cavity size was mild to moderately dilated. There is moderately increased left ventricular wall thickness.  3. The patient is very tachycardic so assessment of the LV function is difficult. There is moderate LVH and the LV cavity appears to be small.  4. The right ventricle has normal systolc function. The cavity was normal. Right ventricular systolic pressure is  mildly elevated.  5. A Edwards Sapien bioprosthetic aortic valve (TAVR) valve is present in the aortic position.  6. No stenosis of the aortic valve.  Cardiac cath 2017    LM lesion, 100 %stenosed.  Prox RCA lesion, 100 %stenosed.  SVG.  Seq SVG- OM1 and OM2 graft was visualized by angiography and is normal in caliber and anatomically normal.  LIMA and is normal in caliber.  The graft exhibits mild focal disease.   Findings:  1. Unable to cannulate the LM due to TAVR graft previously shown to have severe high grade LAD and LCX CAD 2. RCA known total occlusion' 3. LIMA to LAD widely patent 4. Sequential SVG to OM-1 & OM-2 widely patent 5. SVG to distal RCA widely patent  Plan:  1. Known severe 3-v CAD with stable revascularization with all grafts widely patent.  2. Continue medical therapy.   Patient Profile     85 y.o. female with extensive history of coronary artery disease (total occlusion of left main and right coronary artery and previous bypass surgery with patent sequential SVG to OM1-OM 2 patent SVG to RCA and patent LIMA to LAD), string of aortic valve replacement and TAVR valve in valve with mildly elevated gradients, admitted with acute febrile respiratory illness and new onset atrial fibrillation with rapid ventricular response, complicated by transient hypotension and acute on chronic renal insufficiency, new reduction in left ventricular systolic function with regional wall motion abnormality  Assessment & Plan    1. CHF: Reduction left ventricular systolic function may be related to rate related myocardial ischemia or stress cardiomyopathy.  Does not appear to be severely bulimic on my exam.  In/out recording has been inaccurate.  Essentially unchanged since admission.  Her weight is also pretty much in line with her average weight over the last 12 months as an outpatient.  I do not think she needs additional diuresis, suspect the heart failure is triggered by  the very rapid ventricular rate in a patient with underlying areas of myocardial ischemia.  I would not recommend additional diuretics at this time. 2. CAD: Rate related ischemia (in territories of the anterior wall that are not adequately supplied due to occlusion of the left main coronary artery and probable inadequate retrograde flow from the LIMA bypass) would not be surprising considering how tachycardic she has been.  Her presentation does  not suggest a new acute atherothrombotic coronary event.  Invasive coronary evaluation does not appear necessary at this time.  I would recommend peat evaluation of left ventricular ejection fraction by echo in several weeks. 3.  Atrial fibrillation with rapid ventricular response: Rate control has been challenging due to her hypotension.  Continue intravenous amiodarone.  We will add a third daily dose of metoprolol tartrate 12.5 mg.  Avoid diltiazem due to hypotension and decreased left ventricular systolic function, avoid digoxin due to worsening renal function. 4.  AS s/p TAVR: Mildly increased gradients across the valve are not a surprise in a patient with a small TAVR stent valve placed inside a previous bioprosthetic valve. 5. Acute on CKD stage III: Did not increase in creatinine likely related to brief episode of hypotension.  Thankfully renal dysfunction has stabilized and we will see improvement in the next 24 hours. 6. Pneumonia: Etiology remains uncertain.  WBC remains elevated.  Covid19 testing still pending.  She does not have lymphopenia.  Underlying COPD increases the impact of the acute infection.  Chest x-ray pattern suggestive of possible MAI superinfection.     For questions or updates, please contact [facility] Please consult [email or web address] for contact info under        Signed, Thurmon Fair, MD  09/24/2018, 10:45 AM

## 2018-09-24 NOTE — Progress Notes (Signed)
  Date: 09/24/2018  Patient name: [name]  Medical record number: [id number]  Date of birth: 18-Jun-1933   I have seen and evaluated this patient and I have discussed the plan of care with the house staff. Please see their note for complete details. I concur with their findings with the following additions/corrections:   She reports feeling significantly better today.  She remains in A. fib with heart rates 110-130.  On exam, she appears more comfortable and is able to be more talkative.  Systolic murmur 2/6 audible at the right upper sternal border.  JVP is still elevated to the angle of the mandible sitting upright.  Lungs are clear though breath sounds are distant as she has difficulty taking a deep breath.  Appreciate cardiology assistance, as rate control in her is challenging.  They are planning to increase the amiodarone, as well as adding a third dose of metoprolol 12.5 mg.  Hopefully we will get some improved rate control on her soon, as I think this will significantly improve her cardiac function.  Unfortunately, I/O has not been accurately recorded.  Weight is up slightly today.  On my exam, she still has significant JVP so we would plan to continue diuresis, but cardiology is not sure she needs more diuresis at this point.  We will continue to reassess.  Regarding her AKI on CKD, creatinine has stabilized today and will hopefully improve.  Likely ATN due to a period of hypotension after starting metoprolol.  Finally, regarding her fever and pneumonia, there was a confusing note charted overnight stating that she had tested negative for COVID-19.  Unfortunately, this was not accurate, and the results are still pending.  She has been taken off of precautions as she was tested "inappropriately".  To be clear, we still do not have a source for the fever she had on admission, though it is reassuring that she has remained afebrile since.  We will continue to observe her off of  antibiotics, and hopefully her test does indeed return negative, as otherwise multiple staff and providers will have been exposed.  Jessy Oto, M.D., Ph.D. 09/24/2018, 2:16 PM

## 2018-09-25 DIAGNOSIS — I25810 Atherosclerosis of coronary artery bypass graft(s) without angina pectoris: Secondary | ICD-10-CM

## 2018-09-25 DIAGNOSIS — I5041 Acute combined systolic (congestive) and diastolic (congestive) heart failure: Secondary | ICD-10-CM

## 2018-09-25 LAB — NOVEL CORONAVIRUS, NAA (HOSP ORDER, SEND-OUT TO REF LAB; TAT 18-24 HRS): SARS-CoV-2, NAA: NOT DETECTED

## 2018-09-25 LAB — HEPARIN LEVEL (UNFRACTIONATED): Heparin Unfractionated: 0.37 IU/mL (ref 0.30–0.70)

## 2018-09-25 LAB — CBC
HCT: 27.7 % — ABNORMAL LOW (ref 36.0–46.0)
Hemoglobin: 8.4 g/dL — ABNORMAL LOW (ref 12.0–15.0)
MCH: 22.8 pg — ABNORMAL LOW (ref 26.0–34.0)
MCHC: 30.3 g/dL (ref 30.0–36.0)
MCV: 75.1 fL — ABNORMAL LOW (ref 80.0–100.0)
Platelets: 293 10*3/uL (ref 150–400)
RBC: 3.69 MIL/uL — ABNORMAL LOW (ref 3.87–5.11)
RDW: 18.8 % — ABNORMAL HIGH (ref 11.5–15.5)
WBC: 9.7 10*3/uL (ref 4.0–10.5)
nRBC: 0.4 % — ABNORMAL HIGH (ref 0.0–0.2)

## 2018-09-25 LAB — BASIC METABOLIC PANEL
Anion gap: 13 (ref 5–15)
BUN: 36 mg/dL — ABNORMAL HIGH (ref 8–23)
CO2: 26 mmol/L (ref 22–32)
Calcium: 9.1 mg/dL (ref 8.9–10.3)
Chloride: 94 mmol/L — ABNORMAL LOW (ref 98–111)
Creatinine, Ser: 1.84 mg/dL — ABNORMAL HIGH (ref 0.44–1.00)
GFR calc Af Amer: 29 mL/min — ABNORMAL LOW (ref >60)
GFR calc non Af Amer: 25 mL/min — ABNORMAL LOW (ref >60)
Glucose, Bld: 116 mg/dL — ABNORMAL HIGH (ref 70–99)
Potassium: 3.3 mmol/L — ABNORMAL LOW (ref 3.5–5.1)
Sodium: 133 mmol/L — ABNORMAL LOW (ref 135–145)

## 2018-09-25 MED ORDER — AMIODARONE HCL 200 MG PO TABS
400.0000 mg | ORAL_TABLET | Freq: Every day | ORAL | Status: DC
  Administered 2018-09-25 – 2018-09-26 (×2): 400 mg via ORAL
  Filled 2018-09-25 (×2): qty 2

## 2018-09-25 MED ORDER — POTASSIUM CHLORIDE CRYS ER 20 MEQ PO TBCR
40.0000 meq | EXTENDED_RELEASE_TABLET | Freq: Two times a day (BID) | ORAL | Status: AC
  Administered 2018-09-25 (×2): 40 meq via ORAL
  Filled 2018-09-25 (×2): qty 2

## 2018-09-25 MED ORDER — METOPROLOL TARTRATE 25 MG PO TABS
25.0000 mg | ORAL_TABLET | Freq: Three times a day (TID) | ORAL | Status: DC
  Administered 2018-09-25 – 2018-09-26 (×4): 25 mg via ORAL
  Filled 2018-09-25 (×4): qty 1

## 2018-09-25 NOTE — Progress Notes (Signed)
Subjective: She continues to say she feels better.  She feels like her breathing is a little bit easier.  She is anxious to get home.  She asked again about rescheduling her bronchoscopy that was supposed to happen at the end of this past week.  Objective:  Vital signs in last 24 hours: Vitals:   09/24/18 1300 09/24/18 1633 09/24/18 2010 09/25/18 0357  BP: 102/81 104/76 104/67 (!) 83/55  Pulse: (!) 125 62 (!) 105 (!) 106  Resp: 19 20 18 19   Temp: (!) 97.5 F (36.4 C) 98 F (36.7 C) 97.6 F (36.4 C) 98.2 F (36.8 C)  TempSrc: Oral Oral Oral Oral  SpO2: 93% 93% 91% 93%  Weight:    58.8 kg  Height:       General: Sitting up in her chair, interactive, in no distress HEENT: No rhinorrhea, right eye ptosis Neck: JVP to the base of the neck sitting upright with abdominojugular reflex Cardiac: Tachycardic, irregularly irregular, HR 110s, 2/6 systolic murmur at right upper sternal border Pulmonary:  Normal respiratory effort and rate, lungs are clear except for few scattered crackles in the bases Abdomen: Soft, nontender, normal bowel sounds Extremities: Variable pulses,warm, brisk cap refill  Significant test results include: Creatinine 1.8 K 3.3 WBC 9.7, down from 14.3 Hgb 8.4, down from 8.8  Assessment/Plan:  Principal Problem:   Atrial fibrillation with RVR (HCC) Active Problems:   Shortness of breath   S/P TAVR 2016   CKD (chronic kidney disease) stage 3, GFR 30-59 ml/min (HCC)   Acute heart failure (HCC)   Fever, unspecified  This is an 85 year old female with a history of CAD status post CABG, aortic stenosis status post AVR in 2009 and TAVR in 2016, CKD stage III, and recent treatment for possible atypical pneumonia who came in with chest pain, shortness of breath, and found to have new onset heart failure with atrial fibrillation with RVR.  Atrial fibrillation with RVR: Rate control still a challenge, has improved slightly with increased amiodarone drip, ranging  between 100-120s.  Still having some low BP, 83/55 at 0400 today. -Cardiology following, appreciate assistance -Switching amiodarone to oral, 400 mg daily -Increasing metoprolol to 25 mg 3 times daily, will need to watch her BP closely -Continue telemetry, target heart rate less than 110 -On heparin drip, would like to go ahead and transition to apixaban 2.5 mg daily  New onset acute systolic heart failure: Likely related to tachycardia, EF 40 to 45% on TTE, but limited by ongoing tachycardia.  Rate control remains the primary goal for now, we will plan to reassess with TTE down the line.  Appears much more euvolemic today, okay to hold diuresis for today. -Hold furosemide today -Daily weights -Strict I's and O's -Trying to wean oxygen today -Continue home aspirin and atorvastatin  Fever and leukocytosis: Still afebrile, leukocytosis slowly downtrending.    Unfortunately, and accurate notes were recorded stating that her COVID-19 testing was negative.  In fact, it is still pending.  Apparently infectious disease team recommended stopping precautions as they consider her very low risk for COVID-19. -Continue following blood cultures and COVID-19 testing  AoCKD stage 3: Creatinine remained stable, still above baseline of 1.2-1.3.  I am still hopeful her renal function will improve gradually over the coming days, as her acute kidney injury was likely related to ATN from brief episodes of hypotension  Iron deficiency anemia: Hgb slowly trending down, iron studies this admission consistent with iron deficiency.  At some point  soon, she will need further evaluation for possible GI bleeding. 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    Anne Shutter, MD 09/25/2018, 11:50 AM

## 2018-09-25 NOTE — Progress Notes (Signed)
ANTICOAGULATION CONSULT NOTE - Follow Up Consult  Pharmacy Consult for Heparin Indication: atrial fibrillation  Allergies  Allergen Reactions  . Amoxicillin Other (See Comments)    Patient tolerated Ancef in May and Zinacef in March 2017. Did it involve swelling of the face/tongue/throat, SOB, or low BP? Unknown Did it involve sudden or severe rash/hives, skin peeling, or any reaction on the inside of your mouth or nose? Unknown Did you need to seek medical attention at a hospital or doctor's office? Unknown When did it last happen?unk If all above answers are "NO", may proceed with cephalosporin use.   Marland Kitchen Zetia [Ezetimibe] Other (See Comments)    DIZZINESS    Patient Measurements: Height: 5' (152.4 cm) Weight: 129 lb 11.2 oz (58.8 kg) IBW/kg (Calculated) : 45.5 Heparin Dosing Weight: 61.2 kg  Vital Signs: Temp: 98.2 F (36.8 C) (03/21 0357) Temp Source: Oral (03/21 0357) BP: 83/55 (03/21 0357) Pulse Rate: 106 (03/21 0357)  Labs: Recent Labs    09/23/18 0648 09/23/18 0920 09/24/18 0304 09/25/18 0257  HGB 8.8*  --  8.8* 8.4*  HCT 29.0*  --  29.2* 27.7*  PLT 258  --  320 293  HEPARINUNFRC 0.57  --  0.48 0.37  CREATININE  --  1.83* 1.85* 1.84*    Estimated Creatinine Clearance: 18.6 mL/min (A) (by C-G formula based on SCr of 1.84 mg/dL (H)).  Assessment: 42 yoF with new onset AFib RVR and HFpEF. Pt started on IV heparin. Heparin levels are therapeutic at 0.37, H/H is stable today.  Goal of Therapy:  Heparin level 0.3-0.7 units/ml Monitor platelets by anticoagulation protocol: Yes   Plan:  -Heparin 850 units/hr -Monitor S/Sx bleeding and heparin level/CBC stable   Fredonia Highland, PharmD, BCPS Clinical Pharmacist [phone] Please check AMION for all [facility] numbers 09/25/2018

## 2018-09-25 NOTE — Progress Notes (Signed)
Progress Note  Patient Name: [name] Date of Encounter: 09/25/2018  Primary Cardiologist: [name], MD   Subjective   Improved breathing, off O2. Remains tachycardic with relatively low BP.  Inpatient Medications    Scheduled Meds: . amiodarone  400 mg Oral Daily  . aspirin EC  81 mg Oral Daily  . atorvastatin  80 mg Oral Daily  . cholecalciferol  2,000 Units Oral Daily  . gabapentin  100 mg Oral TID  . ketorolac  1 drop Right Eye BID  . metoprolol tartrate  25 mg Oral Q8H  . pantoprazole  40 mg Oral Daily  . sodium chloride flush  3 mL Intravenous Q12H  . sodium chloride flush  3 mL Intravenous Q12H  . timolol  1 drop Both Eyes BID   Continuous Infusions: . heparin 850 Units/hr (09/25/18 0349)   PRN Meds: acetaminophen **OR** acetaminophen, nitroGLYCERIN, senna-docusate, sodium chloride flush   Vital Signs    Vitals:   09/24/18 1300 09/24/18 1633 09/24/18 2010 09/25/18 0357  BP: 102/81 104/76 104/67 (!) 83/55  Pulse: (!) 125 62 (!) 105 (!) 106  Resp: Temp: (!) 97.5 F (36.4 C) 98 F (36.7 C) 97.6 F (36.4 C) 98.2 F (36.8 C)  TempSrc: Oral Oral Oral Oral  SpO2: 93% 93% 91% 93%  Weight:    58.8 kg  Height:        Intake/Output Summary (Last 24 hours) at 09/25/2018 1124 Last data filed at 09/25/2018 0349 Gross per 24 hour  Intake 680.96 ml  Output 1800 ml  Net -1119.04 ml   Last 3 Weights 09/25/2018 09/24/2018 09/23/2018  Weight (lbs) 129 lb 11.2 oz 136 lb 0.4 oz 134 lb 0.6 oz  Weight (kg) 58.832 kg 61.7 kg 60.8 kg      Telemetry    AFib w RVR - Personally Reviewed  ECG    No new tracing - Personally Reviewed  Physical Exam  Elderly and frail GEN: No acute distress.   Neck: No JVD Cardiac: irregular, no murmurs, rubs, or gallops.  Respiratory: Clear to auscultation bilaterally. GI: Soft, nontender, non-distended  MS: No edema; No deformity. Neuro:  Nonfocal  Psych: Normal affect   Labs    Chemistry Recent  Labs  Lab 09/21/18 1637  09/23/18 0920 09/24/18 0304 09/25/18 0257  NA 134*   < > 135 134* 133*  K 3.2*   < > 4.4 3.8 3.3*  CL 100   < > 99 96* 94*  CO2 24   < > GLUCOSE 132*   < > 189* 124* 116*  BUN 21   < > 30* 37* 36*  CREATININE 1.29*   < > 1.83* 1.85* 1.84*  CALCIUM 9.3   < > 9.3 9.2 9.1  PROT 6.5  --   --   --   --   ALBUMIN 3.1*  --   --   --   --   AST 17  --   --   --   --   ALT 14  --   --   --   --   ALKPHOS 88  --   --   --   --   BILITOT 1.6*  --   --   --   --   GFRNONAA 38*   < > 25* 25* 25*  GFRAA 44*   < > 29* 29* 29*  ANIONGAP 10   < > 13 11  13   < > = values in this interval not displayed.     Hematology Recent Labs  Lab 09/23/18 0648 09/24/18 0304 09/25/18 0257  WBC 12.7* 14.3* 9.7  RBC 3.83* 3.87 3.69*  HGB 8.8* 8.8* 8.4*  HCT 29.0* 29.2* 27.7*  MCV 75.7* 75.5* 75.1*  MCH 23.0* 22.7* 22.8*  MCHC 30.3 30.1 30.3  RDW 18.7* 18.8* 18.8*  PLT 258 320 293    Cardiac Enzymes Recent Labs  Lab 09/21/18 2226 09/22/18 0313  TROPONINI 0.09* 0.07*    Recent Labs  Lab 09/21/18 1657 09/21/18 2032  TROPIPOC 0.08 0.10*     BNP Recent Labs  Lab 09/21/18 1649  BNP 1,334.7*     DDimer No results for input(s): DDIMER in the last 168 hours.   Radiology    No results found.  Cardiac Studies   ECHO 09/22/2018  1. Severe akinesis of the left ventricular, entire anteroseptal wall. 2. The left ventricle has mild-moderately reduced systolic function, with an ejection fraction of 40-45%. The cavity size was mild to moderately dilated. There is moderately increased left ventricular wall thickness. 3. The patient is very tachycardic so assessment of the LV function is difficult. There is moderate LVH and the LV cavity appears to be small. 4. The right ventricle has normal systolc function. The cavity was normal. Right ventricular systolic pressure is mildly elevated. 5. A Edwards Sapien bioprosthetic aortic valve (TAVR) valve is  present in the aortic position. 6. No stenosis of the aortic valve.  Cardiac cath 2017    LM lesion, 100 %stenosed.  Prox RCA lesion, 100 %stenosed.  SVG.  Seq SVG- OM1 and OM2 graft was visualized by angiography and is normal in caliber and anatomically normal.  LIMA and is normal in caliber.  The graft exhibits mild focal disease.  Findings:  1. Unable to cannulate the LM due to TAVR graft previously shown to have severe high grade LAD and LCX CAD 2. RCA known total occlusion' 3. LIMA to LAD widely patent 4. Sequential SVG to OM-1 & OM-2 widely patent 5. SVG to distal RCA widely patent  Plan:  1. Known severe 3-v CAD with stable revascularization with all grafts widely patent.  2. Continue medical therapy.   Patient Profile     84 y.o. female with extensive history of coronary artery disease (total occlusion of left main and right coronary artery and previous bypass surgery with patent sequential SVG to OM1-OM 2 patent SVG to RCA and patent LIMA to LAD), string of aortic valve replacement and TAVR valve in valve with mildly elevated gradients, admitted with acute febrile respiratory illness and new onset atrial fibrillation with rapid ventricular response, complicated by transient hypotension and acute on chronic renal insufficiency, new reduction in left ventricular systolic function with regional wall motion abnormality  Assessment & Plan    1. CHF:  Appears euvolemic.  I do not think she needs additional diuresis, suspect the heart failure is triggered by the very rapid ventricular rate in a patient with underlying areas of myocardial ischemia. (stress CMP is another possibility). Repeat echo in several weeks. 2. CAD: Rate related ischemia (in territories of the anterior wall that are not adequately supplied due to occlusion of the left main coronary artery and probable inadequate retrograde flow from the LIMA bypass) would not be surprising considering how  tachycardic she has been.  Her presentation does not suggest a new acute atherothrombotic coronary event.  Invasive coronary evaluation does not appear necessary at this  time.  3.  Atrial fibrillation with rapid ventricular response: Rate control remains challenging due to her hypotension.  Continue amiodarone, but change to PO.  Increase the dose of metoprolol tartrate to 25 mg 3 times daily.  Avoid diltiazem due to hypotension and decreased left ventricular systolic function, avoid digoxin due to worsened renal function. 4.  AS s/p TAVR: Mildly increased gradients across the valve are not a surprise in a patient with a small TAVR stent valve placed inside a previous bioprosthetic valve. 5. Acute on CKD stage III: worsened from baseline, but stable last 72 h. 6. Pneumonia: Etiology remains uncertain.  WBC has returned to normal.  Covid19 testing still pending.  She does not have lymphopenia.  Underlying COPD increases the impact of the acute infection.  Chest x-ray pattern suggestive of possible MAI superinfection.     For questions or updates, please contact [facility] Please consult [email or web address] for contact info under        Signed, Thurmon Fair, MD  09/25/2018, 11:24 AM

## 2018-09-26 DIAGNOSIS — Z7901 Long term (current) use of anticoagulants: Secondary | ICD-10-CM

## 2018-09-26 LAB — CULTURE, BLOOD (ROUTINE X 2)
Culture: NO GROWTH
Culture: NO GROWTH

## 2018-09-26 LAB — BASIC METABOLIC PANEL
Anion gap: 11 (ref 5–15)
BUN: 39 mg/dL — ABNORMAL HIGH (ref 8–23)
CO2: 25 mmol/L (ref 22–32)
Calcium: 9.3 mg/dL (ref 8.9–10.3)
Chloride: 97 mmol/L — ABNORMAL LOW (ref 98–111)
Creatinine, Ser: 1.92 mg/dL — ABNORMAL HIGH (ref 0.44–1.00)
GFR calc Af Amer: 27 mL/min — ABNORMAL LOW (ref >60)
GFR calc non Af Amer: 24 mL/min — ABNORMAL LOW (ref >60)
Glucose, Bld: 112 mg/dL — ABNORMAL HIGH (ref 70–99)
Potassium: 4.8 mmol/L (ref 3.5–5.1)
Sodium: 133 mmol/L — ABNORMAL LOW (ref 135–145)

## 2018-09-26 LAB — CBC
HCT: 28.3 % — ABNORMAL LOW (ref 36.0–46.0)
Hemoglobin: 8.4 g/dL — ABNORMAL LOW (ref 12.0–15.0)
MCH: 22.4 pg — ABNORMAL LOW (ref 26.0–34.0)
MCHC: 29.7 g/dL — ABNORMAL LOW (ref 30.0–36.0)
MCV: 75.5 fL — ABNORMAL LOW (ref 80.0–100.0)
Platelets: 306 10*3/uL (ref 150–400)
RBC: 3.75 MIL/uL — ABNORMAL LOW (ref 3.87–5.11)
RDW: 18.8 % — ABNORMAL HIGH (ref 11.5–15.5)
WBC: 9.4 10*3/uL (ref 4.0–10.5)
nRBC: 0.3 % — ABNORMAL HIGH (ref 0.0–0.2)

## 2018-09-26 LAB — HEPARIN LEVEL (UNFRACTIONATED): Heparin Unfractionated: 0.3 IU/mL (ref 0.30–0.70)

## 2018-09-26 MED ORDER — AMIODARONE HCL 400 MG PO TABS: 400.0000 mg | ORAL_TABLET | Freq: Every day | ORAL | 0 refills | Status: DC

## 2018-09-26 MED ORDER — APIXABAN 2.5 MG PO TABS
2.5000 mg | ORAL_TABLET | Freq: Two times a day (BID) | ORAL | Status: DC
  Administered 2018-09-26: 2.5 mg via ORAL

## 2018-09-26 MED ORDER — APIXABAN 2.5 MG PO TABS: 2.5000 mg | ORAL_TABLET | Freq: Two times a day (BID) | ORAL | 0 refills | Status: DC

## 2018-09-26 MED ORDER — APIXABAN 2.5 MG PO TABS
2.5000 mg | ORAL_TABLET | Freq: Every day | ORAL | Status: DC
  Filled 2018-09-26: qty 1

## 2018-09-26 MED ORDER — METOPROLOL TARTRATE 25 MG PO TABS: 25.0000 mg | ORAL_TABLET | Freq: Three times a day (TID) | ORAL | 0 refills | Status: DC

## 2018-09-26 MED ORDER — AMIODARONE HCL 200 MG PO TABS: 200.0000 mg | ORAL_TABLET | Freq: Every day | ORAL | 0 refills | Status: DC

## 2018-09-26 NOTE — Discharge Summary (Addendum)
Name: [name] MRN: [id number] DOB: 1933/02/13 85 y.o. PCP: Leota Jacobsen, MD  Date of Admission: 09/21/2018  4:17 PM Date of Discharge: 09/26/2018 09/26/2018  2:19 PM Attending Physician: Oda Kilts  Discharge Diagnosis: 1. Atrial fibrillation 2. Acute HFrEF 3. Fever/leukocytosis 4. AoCKD stage 3 5. Microcytic anemia  Discharge Medications: Allergies as of 09/26/2018      Reactions   Amoxicillin Other (See Comments)   Patient tolerated Ancef in May and Zinacef in March 2017. Did it involve swelling of the face/tongue/throat, SOB, or low BP? Unknown Did it involve sudden or severe rash/hives, skin peeling, or any reaction on the inside of your mouth or nose? Unknown Did you need to seek medical attention at a hospital or doctor's office? Unknown When did it last happen?unk If all above answers are "NO", may proceed with cephalosporin use.   Zetia [ezetimibe] Other (See Comments)   DIZZINESS      Medication List    STOP taking these medications   amLODipine 2.5 MG tablet Commonly known as:  NORVASC   aspirin 81 MG tablet   hydrochlorothiazide 25 MG tablet Commonly known as:  HYDRODIURIL     TAKE these medications   amiodarone 200 MG tablet Commonly known as:  Pacerone Take 1 tablet (200 mg total) by mouth daily.   amiodarone 400 MG tablet Commonly known as:  PACERONE Take 1 tablet (400 mg total) by mouth daily.   apixaban 2.5 MG Tabs tablet Commonly known as:  ELIQUIS Take 1 tablet (2.5 mg total) by mouth 2 (two) times daily.   atorvastatin 80 MG tablet Commonly known as:  LIPITOR Take 80 mg by mouth daily.   gabapentin 100 MG capsule Commonly known as:  Neurontin Take 1 capsule (100 mg total) by mouth 3 (three) times daily.   ketorolac 0.5 % ophthalmic solution Commonly known as:  ACULAR Place 1 drop into the right eye 2 (two) times daily.   metoprolol tartrate 25 MG tablet Commonly known as:  LOPRESSOR Take 1 tablet (25 mg  total) by mouth every 8 (eight) hours.   nitroGLYCERIN 0.4 MG SL tablet Commonly known as:  NITROSTAT Place 1 tablet (0.4 mg total) under the tongue every 5 (five) minutes x 3 doses as needed for chest pain.   pantoprazole 40 MG tablet Commonly known as:  PROTONIX TAKE 1 TABLET BY MOUTH ONCE DAILY   timolol 0.5 % ophthalmic solution Commonly known as:  TIMOPTIC Place 1 drop into both eyes 2 (two) times daily.   Vitamin D3 25 MCG (1000 UT) Caps Take 2,000 Units by mouth daily.   zolpidem 5 MG tablet Commonly known as:  AMBIEN Take 5 mg by mouth at bedtime as needed for sleep.       Disposition and follow-up:   Ms.[name] was discharged from [facility] in Stable condition.  At the hospital follow up visit please address:  1.  Atrial fibrillation: Patient should be on amiodarone 400 mg for 2 weeks and then amiodarone 200 mg daily.  She should also be on Eliquis 2.5 mg twice daily and metoprolol 5 mg 3 times daily.  Make sure she has follow-up with her cardiologist, they may consider a cardioversion if she is still in a fib.   HFrEF: Please assess volume status, she is currently not on any diuretics, can consider repeating Echocardiogram.   AoCKD: Please recheck labs, Cr on discharge was 1.9.  2.  Labs / imaging needed at  time of follow-up: CBC, BMP  3.  Pending labs/ test needing follow-up: None  Follow-up Appointments: Follow-up Information    Leota Jacobsen, MD. Schedule an appointment as soon as possible for a visit.   Specialty:  Family Medicine Why:  Please contact them to arrange follow up appointment in 1 month, or sooner if you develop worsening symptoms Contact information: [place] [address] [phone]        Minus Breeding, MD .   Specialty:  Cardiology Contact information: [address] [place] [address] [place] [facility] Course by problem list: 1. Atrial fibrillation  with RVR: This is an 85 year old female with a history of CAD status post CABG, aortic stenosis status post AVR in 2009 and TAVR in 2016, CKD stage III, and recent treatment for possible atypical pneumonia who came in with chest pain, shortness of breath, and found to have new onset heart failure with atrial fibrillation with RVR.  She was started on a heparin drip and an amiodarone drip.  Heart rate was still elevated so metoprolol was added.  She started having some hypotension so metoprolol was decreased.  Digoxin was not a good option due to her kidney disease.  Heart rate remained elevated, and the amiodarone and metoprolol were titrated up to oral amiodarone 400 mg daily, and metoprolol 25 mg 3 times daily with improved HR.  Heparin drip was switched to apixaban 2.5 twice daily.  She was advised to take her amiodarone at 400 mg for 2 weeks and then decrease it to 200 mg daily. Patient will follow-up with cardiology, can consider cardioversion if patient is still in A. fib.  2. Acute HFrEF: She presented with worsening shortness of breath and lower extremity edema, requiring supplemental oxygen.  She had a TTE that showed decreased EF to 40 to 45%, severe akinesis of the anteroseptal wall, dilated LV, bioprosthetic valve aortic valve that is functioning normally, but TTE difficult to interpret with significant tachycardia.  It was unclear if the new CHF was preceded by or caused her atrial fibrillation.  Patient was diuresed with IV Lasix and she had good output. She was noted to be euvolemic so diuresis was held.  She was weaned off any oxygen and shortness of breath had improved.  She was continued on her home atorvastatin and her aspirin was switched to apixaban.   3. Fever/leukocytosis: She was febrile and had a leukocytosis 12 on admission.  Chest x-ray showed bilateral patchy opacities worse in both bases.  She had been following with the pulmonology clinic for atypical pneumonia vs MAC, and had been  treated with first with augmentin and doxycycline, followed by levofloxacin.  She was scheduled for bronchoscopy but missed it due to hospitalization.  Her flu and RVP were negative, there was concern about possible COVID-19 so this was tested and she was kept on precautions until the test was negative.  During her hospitalization her leukocytosis resolved and she remained afebrile with no antibiotics.  She will need to follow up with pulmonology to determine need for and timing of bronchoscopy.  4. AoCKD stage 3: On admission her creatinine was at her baseline of around 1.2, she was admitted her creatinine bumped up to 1.8, this was thought to be due to her volume overload and episodes of hypotension, causing either ATN or cardiorenal syndrome.  Creatinine did not improve much the rest of her hospitalization, creatinine on day  of discharge was 1.9.   5. Microcytic anemia: Patient had no active signs of bleeding during her admission.  Iron studies showed iron deficiency anemia.  She did not require any blood transfusions and her hemoglobin remained stable.  She will need a colonoscopy and repeat labs on follow-up.  Discharge Vitals:   BP 101/69   Pulse (!) 115   Temp 98 F (36.7 C) (Oral)   Resp 16   Ht 5' (1.524 m)   Wt 59.3 kg   SpO2 96%   BMI 25.53 kg/m   Pertinent Labs, Studies, and Procedures:  BMP Latest Ref Rng & Units 09/26/2018 09/25/2018 09/24/2018  Glucose 70 - 99 mg/dL 112(H) 116(H) 124(H)  BUN 8 - 23 mg/dL 39(H) 36(H) 37(H)  Creatinine 0.44 - 1.00 mg/dL 1.92(H) 1.84(H) 1.85(H)  BUN/Creat Ratio 12 - 28 - - -  Sodium 135 - 145 mmol/L 133(L) 133(L) 134(L)  Potassium 3.5 - 5.1 mmol/L 4.8 3.3(L) 3.8  Chloride 98 - 111 mmol/L 97(L) 94(L) 96(L)  CO2 22 - 32 mmol/L _0 Calcium 8.9 - 10.3 mg/dL 9.3 9.1 9.2   CBC Latest Ref Rng & Units 09/26/2018 09/25/2018 09/24/2018  WBC 4.0 - 10.5 K/uL 9.4 9.7 14.3(H)  Hemoglobin 12.0 - 15.0 g/dL 8.4(L) 8.4(L) 8.8(L)  Hematocrit 36.0 - 46.0 %  28.3(L) 27.7(L) 29.2(L)  Platelets 150 - 400 K/uL 306 293 320   Echocardiogram 09/22/18:  1. Severe akinesis of the left ventricular, entire anteroseptal wall.  2. The left ventricle has mild-moderately reduced systolic function, with an ejection fraction of 40-45%. The cavity size was mild to moderately dilated. There is moderately increased left ventricular wall thickness.  3. The patient is very tachycardic so assessment of the LV function is difficult. There is moderate LVH and the LV cavity appears to be small.  4. The right ventricle has normal systolc function. The cavity was normal. Right ventricular systolic pressure is mildly elevated.  5. A Edwards Sapien bioprosthetic aortic valve (TAVR) valve is present in the aortic position.  6. No stenosis of the aortic valve.  Discharge Instructions: Discharge Instructions    (HEART FAILURE PATIENTS) Call MD:  Anytime you have any of the following symptoms: 1) 3 pound weight gain in 24 hours or 5 pounds in 1 week 2) shortness of breath, with or without a dry hacking cough 3) swelling in the hands, feet or stomach 4) if you have to sleep on extra pillows at night in order to breathe.   Complete by:  As directed    Call MD for:  difficulty breathing, headache or visual disturbances   Complete by:  As directed    Call MD for:  extreme fatigue   Complete by:  As directed    Call MD for:  hives   Complete by:  As directed    Call MD for:  persistant dizziness or light-headedness   Complete by:  As directed    Call MD for:  persistant nausea and vomiting   Complete by:  As directed    Call MD for:  redness, tenderness, or signs of infection (pain, swelling, redness, odor or green/yellow discharge around incision site)   Complete by:  As directed    Call MD for:  severe uncontrolled pain   Complete by:  As directed    Call MD for:  temperature >100.4   Complete by:  As directed    Diet - low sodium heart healthy   Complete by:  As  directed     Discharge instructions   Complete by:  As directed    Alisea E Schoffstall,   It has been a pleasure working with you and we are glad you're feeling better. You were hospitalized for an irregular heart rhythm called atrial fibrillation and heart failure.    For your atrial fibrillation,  START taking amiodarone 400 mg daily for the next 2 weeks, then take 200 mg daily after that Start taking eliquis 2.5 mg twice a day Start taking metoprolol 25 mg three times a day Stop taking aspirin, hydrochlorothiazide, and amlodipine for now Follow up with your cardiologist in 2 weeks, it will be a telemedicine encounter   Follow up with your primary care provider in 3-4 weeks   If your symptoms worsen or you develop new symptoms, please seek medical help whether it is your primary care provider or emergency department.  If you have any questions about this hospitalization please call [phone].   Increase activity slowly   Complete by:  As directed       Signed: Asencion Noble, MD 09/29/2018, 1:57 PM   Pager: [phone]    Internal Medicine Attending Note:  I saw and examined the patient on the day of discharge. I reviewed and agree with the discharge summary written by the house staff, with my edits as needed.  Lenice Pressman, M.D., Ph.D.

## 2018-09-26 NOTE — Progress Notes (Signed)
Progress Note  Patient Name: [name] Date of Encounter: 09/26/2018  Primary Cardiologist: [name], MD   Subjective   Feels well.  Remains off oxygen and is able to lie flat in bed. Ventricular rate control is improved with rates around 100 bpm.  Inpatient Medications    Scheduled Meds: . amiodarone  400 mg Oral Daily  . apixaban  2.5 mg Oral BID  . aspirin EC  81 mg Oral Daily  . atorvastatin  80 mg Oral Daily  . cholecalciferol  2,000 Units Oral Daily  . gabapentin  100 mg Oral TID  . ketorolac  1 drop Right Eye BID  . metoprolol tartrate  25 mg Oral Q8H  . pantoprazole  40 mg Oral Daily  . sodium chloride flush  3 mL Intravenous Q12H  . sodium chloride flush  3 mL Intravenous Q12H  . timolol  1 drop Both Eyes BID   Continuous Infusions:  PRN Meds: acetaminophen **OR** acetaminophen, nitroGLYCERIN, senna-docusate, sodium chloride flush   Vital Signs    Vitals:   09/25/18 0357 09/25/18 2009 09/26/18 0412 09/26/18 0813  BP: (!) 83/55 (!) 88/67 (!) 92/57   Pulse: (!) 106 91 90 98  Resp: 19 17 16    Temp: 98.2 F (36.8 C) 98.2 F (36.8 C) 98 F (36.7 C)   TempSrc: Oral Oral Oral   SpO2: 93% 94% 94% 96%  Weight: 58.8 kg  59.3 kg   Height:        Intake/Output Summary (Last 24 hours) at 09/26/2018 0940 Last data filed at 09/26/2018 0313 Gross per 24 hour  Intake 175.28 ml  Output -  Net 175.28 ml   Last 3 Weights 09/26/2018 09/25/2018 09/24/2018  Weight (lbs) 130 lb 11.2 oz 129 lb 11.2 oz 136 lb 0.4 oz  Weight (kg) 59.285 kg 58.832 kg 61.7 kg      Telemetry    Atrial fibrillation with borderline ventricular rate control- Personally Reviewed  ECG    No new tracing- Personally Reviewed  Physical Exam  Appears comfortable, smiling GEN: No acute distress.   Neck: Jugular venous pulsations approximately 6-8 cm with fairly prominent V waves Cardiac:  Regular, 2-3/6 systolic ejection murmur no diastolic murmurs, rubs, or gallops.   Respiratory: Clear to auscultation bilaterally. GI: Soft, nontender, non-distended  MS: No edema; No deformity. Neuro:  Nonfocal  Psych: Normal affect   Labs    Chemistry Recent Labs  Lab 09/21/18 1637  09/24/18 0304 09/25/18 0257 09/26/18 0328  NA 134*   < > 134* 133* 133*  K 3.2*   < > 3.8 3.3* 4.8  CL 100   < > 96* 94* 97*  CO2 24   < > 27 26 25   GLUCOSE 132*   < > 124* 116* 112*  BUN 21   < > 37* 36* 39*  CREATININE 1.29*   < > 1.85* 1.84* 1.92*  CALCIUM 9.3   < > 9.2 9.1 9.3  PROT 6.5  --   --   --   --   ALBUMIN 3.1*  --   --   --   --   AST 17  --   --   --   --   ALT 14  --   --   --   --   ALKPHOS 88  --   --   --   --   BILITOT 1.6*  --   --   --   --  GFRNONAA 38*   < > 25* 25* 24*  GFRAA 44*   < > 29* 29* 27*  ANIONGAP 10   < > < > = values in this interval not displayed.     Hematology Recent Labs  Lab 09/24/18 0304 09/25/18 0257 09/26/18 0328  WBC 14.3* 9.7 9.4  RBC 3.87 3.69* 3.75*  HGB 8.8* 8.4* 8.4*  HCT 29.2* 27.7* 28.3*  MCV 75.5* 75.1* 75.5*  MCH 22.7* 22.8* 22.4*  MCHC 30.1 30.3 29.7*  RDW 18.8* 18.8* 18.8*  PLT 320 293 306    Cardiac Enzymes Recent Labs  Lab 09/21/18 2226 09/22/18 0313  TROPONINI 0.09* 0.07*    Recent Labs  Lab 09/21/18 1657 09/21/18 2032  TROPIPOC 0.08 0.10*     BNP Recent Labs  Lab 09/21/18 1649  BNP 1,334.7*     DDimer No results for input(s): DDIMER in the last 168 hours.   Radiology    No results found.  Cardiac Studies  September 22, 2018  1. Severe akinesis of the left ventricular, entire anteroseptal wall.  2. The left ventricle has mild-moderately reduced systolic function, with an ejection fraction of 40-45%. The cavity size was mild to moderately dilated. There is moderately increased left ventricular wall thickness.  3. The patient is very tachycardic so assessment of the LV function is difficult. There is moderate LVH and the LV cavity appears to be small.  4. The right  ventricle has normal systolc function. The cavity was normal. Right ventricular systolic pressure is mildly elevated.  5. A Edwards Sapien bioprosthetic aortic valve (TAVR) valve is present in the aortic position.  6. No stenosis of the aortic valve.  Cardiac cath 2017    LM lesion, 100 %stenosed.  Prox RCA lesion, 100 %stenosed.  SVG.  Seq SVG- OM1 and OM2 graft was visualized by angiography and is normal in caliber and anatomically normal.  LIMA and is normal in caliber.  The graft exhibits mild focal disease.  Findings:  1. Unable to cannulate the LM due to TAVR graft previously shown to have severe high grade LAD and LCX CAD 2. RCA known total occlusion' 3. LIMA to LAD widely patent 4. Sequential SVG to OM-1 & OM-2 widely patent 5. SVG to distal RCA widely patent  Plan:  1. Known severe 3-v CAD with stable revascularization with all grafts widely patent.  2. Continue medical therapy.  Patient Profile     85 y.o. female with extensive history of coronary artery disease(total occlusion of left main and right coronary artery and previous bypass surgery with patent sequential SVG to OM1-OM 2 patent SVG to RCA and patent LIMA to LAD),aortic valve replacement and TAVR valve in valve with mildly elevated gradients,admitted with acute febrile respiratory illness and new onset atrial fibrillation with rapid ventricular response,complicated by transient hypotension and acute on chronic renal insufficiency,new reduction in left ventricular systolic function with regional wall motion abnormality  Assessment & Plan    Appears clinically euvolemic, other than mild elevation in jugular venous pulsations.  Ventricular rate control is not ideal but is acceptable.  Weight is stable on oral diuretics.  Denies angina or dyspnea.  Covid19 negative [facility] will sign off.   Medication Recommendations:  Metoprolol tartrate 25 mg 3 times daily Amiodarone 400 mg daily for  another 2 weeks then 200 mg daily Eliquis 2.5 mg twice daily Stop aspirin while on Eliquis Other recommendations (labs, testing, etc):  BMET 2 weeks, can be  drawn close to home at PCP or commercial lab Follow up as an outpatient: Arrange a telemedicine visit in 2 weeks.  Consider elective cardioversion if still in atrial fibrillation at that time.  For questions or updates, please contact [facility] Please consult [email or web address] for contact info under        Signed, Thurmon Fair, MD  09/26/2018, 9:40 AM

## 2018-09-26 NOTE — Discharge Instructions (Signed)
[name],   It has been a pleasure working with you and we are glad you're feeling better. You were hospitalized for an irregular heart rhythm called atrial fibrillation and heart failure.    For your atrial fibrillation,  START taking amiodarone 400 mg daily for the next 2 weeks, then take 200 mg daily after that Start taking eliquis 2.5 mg twice a day Start taking metoprolol 25 mg three times a day Stop taking aspirin, hydrochlorothiazide, and amlodipine for now Follow up with your cardiologist in 2 weeks, it will be a telemedicine encounter   Follow up with your primary care provider in 3-4 weeks   If your symptoms worsen or you develop new symptoms, please seek medical help whether it is your primary care provider or emergency department.  If you have any questions about this hospitalization please call [phone].   Information on my medicine - ELIQUIS (apixaban)  This medication education was reviewed with me or my healthcare representative as part of my discharge preparation.  The pharmacist that spoke with me during my hospital stay was:  Mosetta Anis, [facility]  Why was Eliquis prescribed for you? Eliquis was prescribed for you to reduce the risk of a blood clot forming that can cause a stroke if you have a medical condition called atrial fibrillation (a type of irregular heartbeat).  What do You need to know about Eliquis ? Take your Eliquis TWICE DAILY - one tablet in the morning and one tablet in the evening with or without food. If you have difficulty swallowing the tablet whole please discuss with your pharmacist how to take the medication safely.  Take Eliquis exactly as prescribed by your doctor and DO NOT stop taking Eliquis without talking to the doctor who prescribed the medication.  Stopping may increase your risk of developing a stroke.  Refill your prescription before you run out.  After discharge, you should have regular check-up appointments with  your healthcare provider that is prescribing your Eliquis.  In the future your dose may need to be changed if your kidney function or weight changes by a significant amount or as you get older.  What do you do if you miss a dose? If you miss a dose, take it as soon as you remember on the same day and resume taking twice daily.  Do not take more than one dose of ELIQUIS at the same time to make up a missed dose.  Important Safety Information A possible side effect of Eliquis is bleeding. You should call your healthcare provider right away if you experience any of the following: ? Bleeding from an injury or your nose that does not stop. ? Unusual colored urine (red or dark brown) or unusual colored stools (red or black). ? Unusual bruising for unknown reasons. ? A serious fall or if you hit your head (even if there is no bleeding).  Some medicines may interact with Eliquis and might increase your risk of bleeding or clotting while on Eliquis. To help avoid this, consult your healthcare provider or pharmacist prior to using any new prescription or non-prescription medications, including herbals, vitamins, non-steroidal anti-inflammatory drugs (NSAIDs) and supplements.  This website has more information on Eliquis (apixaban): http://www.eliquis.com/eliquis/home

## 2018-09-26 NOTE — Progress Notes (Signed)
Subjective: [name] reports that she is doing well today, no acute events overnight.  She states that her breathing is better, has been able to walk around with no issues, is currently off oxygen.  She denies any other symptoms including chest pain or palpitations.  She reports that she wants to go home and feels like she is ready.  We discussed that her heart rate is better controlled and she will need to be started on new medications on discharge, she is agreeable to the plan.  Objective:  Vital signs in last 24 hours: Vitals:   09/24/18 2010 09/25/18 0357 09/25/18 2009 09/26/18 0412  BP: 104/67 (!) 83/55 (!) 88/67 (!) 92/57  Pulse: (!) 105 (!) 106 91 90  Resp: 18 19 17 16   Temp: 97.6 F (36.4 C) 98.2 F (36.8 C) 98.2 F (36.8 C) 98 F (36.7 C)  TempSrc: Oral Oral Oral Oral  SpO2: 91% 93% 94% 94%  Weight:  58.8 kg  59.3 kg  Height:        General: Elderly female, no acute distress, pleasant appearing  Cardiac: Irregularly irregular, systolic murmur, JVP not elevated Pulmonary: CTA BL, no wheezing or rhonchi Abdomen: Soft, nontender, nondistended  Extremity: No LE edema, good pulses, warm extremities   Assessment/Plan:  Principal Problem:   Atrial fibrillation with RVR (HCC) Active Problems:   Shortness of breath   S/P TAVR 2016   CKD (chronic kidney disease) stage 3, GFR 30-59 ml/min (HCC)   Acute heart failure (HCC)   Fever, unspecified  This is an 85 year old female with a history of CAD status post CABG, aortic stenosis status post AVR in 2009 and TAVR in 2016, CKD stage III, and recent treatment for possible atypical pneumonia who came in with chest pain, shortness of breath, and found to have new onset heart failure with atrial fibrillation with RVR.  Atrial fibrillation: Patient was swtiched from amiodarone drip to oral amiodarone, metoprolol was increased to 25 mg TID, and transitioned to apixaban 2.5 from the heparin drip. Patient has been rate controlled  with heart rate in the 90s, blood pressures have been a little soft, down to 92/57. She reports that she is feeling well today, heart rate is better controlled, denies any chest pain or shortness of breath. -Cardiology following, appreciate assistance -Continue telemetry -Continue amiodarone 400 mg daily for 2 weeks, then 200 mg daily -Continue metoprolol 25 mg TID -Stop aspirin -Switch heparin to apixaban 2.5 mg daily -Follow-up with cardiology as outpatient, consider cardioversion if patient is still in A. fib  New onset acute systolic heart failure: -Possibly secondary to atrial fibrillation however unclear which came first.  Her TTE showed decreased EF of 40 to 45%, severe akinesis of the anteroseptal wall, dilated LV, and bioprosthetic aortic valve is well positioned and functioning normally, evaluation was limited by tachycardia.  She has been being diuresed however this has been difficult due to her significant tachycardia 2/2 atrial fibrillation. She appeared euvolemic on exam yesterday and diuresis was held. Today she reports that she is feeling well, is off any oxygen and able to walk with no issues.  Weight has been stable. -Cardiology following, appreciate recommendations -Continue to hold diuresis  -Daily weights -Strict I's and O's -Continue home atorvastatin, hold home ASA due to starting eliquis  Fever and leukocytosis: -Patient has remained afebrile, leukocytosis has improved today. COVID-19 came back negative. No obvious source of infection. Blood cultures have remained negative x 5 days. Unclear what caused her fever  and elevated white count however she improved with no antibiotics.   AoCKD stage 3: -BUN and creatinine are still elevated, creatinine is up to 1.92 (from 1.8 yesterday).  She is still volume overloaded and is also still in A. fib with RVR, she may be having decreased renal perfusion, other possibilities include ATN or cardiorenal syndrome. We are holding diuresis  for now.  -F/u BMP outpatient  Microcytic anemia: -Hgb has been downtrending, however stable from yesterday at 8.4. No evidence of bleeding, will need this worked up outpatient  -Monitor hemoglobin -Will need outpatient work-up for possible source of bleeding  Hx of glaucoma with blindness in right eye:  -Continue home timolol and ketorolac drops  FEN: No fluids, replete lytes prn, cardiac diet  VTE ppx: Eliquis Code Status: Partial, DNI, would want CPR and ACLS   Dispo: Anticipated discharge in approximately today(s).   Angela Severance, MD 09/26/2018, 7:59 AM Pager: [phone]

## 2018-09-27 ENCOUNTER — Ambulatory Visit: Payer: Medicare HMO | Admitting: Cardiology

## 2018-09-28 ENCOUNTER — Telehealth: Payer: Self-pay | Admitting: Cardiology

## 2018-09-28 NOTE — Telephone Encounter (Signed)
Appt schedule 4/3 at 10:20am advised patient we will call her for E-visit. She does not need to come in to the office for her appt. Patient verbalized understanding.

## 2018-09-28 NOTE — Telephone Encounter (Signed)
-----   Message from Abelino Derrick, [place] sent at 09/26/2018  9:50 AM EDT ----- This patient needs an E- visit with Dr Antoine Poche in two weeks  Corine Shelter PA-C 09/26/2018 9:51 AM

## 2018-10-01 ENCOUNTER — Ambulatory Visit: Payer: Medicare HMO | Admitting: Pulmonary Disease

## 2018-10-01 ENCOUNTER — Telehealth: Payer: Self-pay | Admitting: Cardiology

## 2018-10-01 NOTE — Telephone Encounter (Signed)
Spoke with patient and reviewed her D/C medications. She thought she was taking a diuretic, advised her diuretic had been d/c when she left hospital. She c/o frequent urination during the day and at night with burning. Advised patient to contact PCP, verbalized understanding.

## 2018-10-01 NOTE — Telephone Encounter (Signed)
New Message:    Patient calling concerning some medication issue. Patient state that she can not sleep. Please call patient.

## 2018-10-06 ENCOUNTER — Ambulatory Visit (INDEPENDENT_AMBULATORY_CARE_PROVIDER_SITE_OTHER): Payer: Medicare HMO | Admitting: Adult Health

## 2018-10-06 ENCOUNTER — Other Ambulatory Visit: Payer: Self-pay

## 2018-10-06 ENCOUNTER — Encounter: Payer: Self-pay | Admitting: Adult Health

## 2018-10-06 DIAGNOSIS — F17210 Nicotine dependence, cigarettes, uncomplicated: Secondary | ICD-10-CM | POA: Diagnosis not present

## 2018-10-06 DIAGNOSIS — J189 Pneumonia, unspecified organism: Secondary | ICD-10-CM | POA: Diagnosis not present

## 2018-10-06 DIAGNOSIS — I480 Paroxysmal atrial fibrillation: Secondary | ICD-10-CM | POA: Diagnosis not present

## 2018-10-06 DIAGNOSIS — I5042 Chronic combined systolic (congestive) and diastolic (congestive) heart failure: Secondary | ICD-10-CM | POA: Diagnosis not present

## 2018-10-06 NOTE — Patient Instructions (Signed)
Mucinex DM Twice daily  As needed  Cough/congestion - may use liquid form.  Follow up in 4 weeks with chest xray and As needed   Please contact office for sooner follow up if symptoms do not improve or worsen or seek emergency care

## 2018-10-06 NOTE — Progress Notes (Signed)
Virtual Visit via Telephone Note  I connected with [name] on 10/06/18 at 10:30 AM EDT by telephone and verified that I am speaking with the correct person using two identifiers.   I discussed the limitations, risks, security and privacy concerns of performing an evaluation and management service by telephone and the availability of in person appointments. I also discussed with the patient that there may be a patient responsible charge related to this service. The patient expressed understanding and agreed to proceed.   History of Present Illness: 85 year old female former smoker with multiple medical problems including coronary artery disease status post aortic valve replacement and CABG, chronic kidney disease, aortic stenosis and dyslipidemia. Seen for pulmonary consult August 27, 2018 for cough and dyspnea,abnormal CT chest with diffuse pulmonary infiltrates and elevated white blood cell count  Today's tele-visit is for follow-up of recent hospitalization.  Patient was seen in office 2 weeks ago with worsening shortness of breath.  She was found to be in A. fib with RVR.  She was transferred to the hospital via EMS and was admitted.  During her hospitalization she was started on heparin drip and amiodarone drip.  Was transitioned over to oral amiodarone at 400 mg daily.  Metoprolol 25 mg 3 times a day.  And apixaban 2.5 mg twice daily.  She is to be on amiodarone 400 mg daily for 2 weeks then decrease to 200 mg daily.  Echo did show decreased EF at 40 to 45%, severe akinesis of the anteroseptal wall, dilated LV and a functioning bioprosthetic aortic valve.  He continued to have fever and leukocytosis during her admission.  She has had an ongoing atypical infection over the last couple months.  She was initially treated with several antibiotics including Augmentin doxycycline and levofloxacin.  She had been scheduled for bronchoscopy but this is been on hold due to her hospitalizations.   During her recent hospitalization her influenza and respiratory viral panels were negative.  She was also tested for CO VID-19.  This was also negative.  Since discharge patient says she is feeling very weak. Has low energy . Minimal leg swelling .  Appetite is fair, eating okay . Has been weighing daily , wts are staying steady at 130lbs .   Breathing is doing better. Cough is very minimal , much better.  No fever. No discolored mucus .  Denies hemoptysis .    Observations/Objective: CT coronaries 07/20/2014-Visualized lung areas show mild areas of bronchiectasis, peribronchovascular micronodular laboratory with tree-in-bud appearance bilaterally.  CTA 08/23/2018-Underlying emphysema, bilateral tree-in-bud opacity in the upper lobe, middle lobe and lingula. No bronchiectasis or interstitial lung disease.  Assessment and Plan: Atypical PNA -abnormal CT chest -clinically she is improving . Hold on bronchoscopy for now with COVID 19 and on Eliquis .  Check Chest xray on return in 4 weeks . Pending those results decide if FOB / or repeat CT chest are indicated.   A Fib with RVR - improved after hospitalization  Cont on current regimen -follow up with cards   CHF - sound improved with steady weights at home .  follow up with cards as planned this week.   Plan  Patient Instructions  Mucinex DM Twice daily  As needed  Cough/congestion - may use liquid form.  Follow up in 4 weeks with chest xray and As needed   Please contact office for sooner follow up if symptoms do not improve or worsen or seek emergency care  Follow Up Instructions: Follow-up in 4 weeks and As needed   Please contact office for sooner follow up if symptoms do not improve or worsen or seek emergency care   Follow-up with cardiology later this week as planned via virtual visit.   I discussed the assessment and treatment plan with the patient. The patient was provided an opportunity to ask questions and all  were answered. The patient agreed with the plan and demonstrated an understanding of the instructions.   The patient was advised to call back or seek an in-person evaluation if the symptoms worsen or if the condition fails to improve as anticipated.  I provided 25 minutes of non-face-to-face time during this encounter. Patient was present for today's visit at home, myself present at office   Rubye Oaks, NP

## 2018-10-07 ENCOUNTER — Other Ambulatory Visit: Payer: Self-pay

## 2018-10-07 ENCOUNTER — Telehealth: Payer: Medicare HMO | Admitting: Adult Health

## 2018-10-08 ENCOUNTER — Other Ambulatory Visit: Payer: Self-pay | Admitting: *Deleted

## 2018-10-08 ENCOUNTER — Telehealth (INDEPENDENT_AMBULATORY_CARE_PROVIDER_SITE_OTHER): Payer: Medicare HMO | Admitting: Cardiology

## 2018-10-08 ENCOUNTER — Encounter: Payer: Self-pay | Admitting: Cardiology

## 2018-10-08 VITALS — BP 151/63 | HR 49 | Ht 60.0 in | Wt 130.0 lb

## 2018-10-08 DIAGNOSIS — Z952 Presence of prosthetic heart valve: Secondary | ICD-10-CM

## 2018-10-08 DIAGNOSIS — R531 Weakness: Secondary | ICD-10-CM | POA: Diagnosis not present

## 2018-10-08 DIAGNOSIS — I4891 Unspecified atrial fibrillation: Secondary | ICD-10-CM

## 2018-10-08 DIAGNOSIS — I5020 Unspecified systolic (congestive) heart failure: Secondary | ICD-10-CM

## 2018-10-08 DIAGNOSIS — I6529 Occlusion and stenosis of unspecified carotid artery: Secondary | ICD-10-CM

## 2018-10-08 DIAGNOSIS — D649 Anemia, unspecified: Secondary | ICD-10-CM

## 2018-10-08 DIAGNOSIS — I2510 Atherosclerotic heart disease of native coronary artery without angina pectoris: Secondary | ICD-10-CM | POA: Diagnosis not present

## 2018-10-08 DIAGNOSIS — I130 Hypertensive heart and chronic kidney disease with heart failure and stage 1 through stage 4 chronic kidney disease, or unspecified chronic kidney disease: Secondary | ICD-10-CM | POA: Diagnosis not present

## 2018-10-08 DIAGNOSIS — I4819 Other persistent atrial fibrillation: Secondary | ICD-10-CM | POA: Insufficient documentation

## 2018-10-08 DIAGNOSIS — I429 Cardiomyopathy, unspecified: Secondary | ICD-10-CM

## 2018-10-08 DIAGNOSIS — Z951 Presence of aortocoronary bypass graft: Secondary | ICD-10-CM

## 2018-10-08 DIAGNOSIS — N183 Chronic kidney disease, stage 3 unspecified: Secondary | ICD-10-CM

## 2018-10-08 HISTORY — DX: Other persistent atrial fibrillation: I48.19
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
## 2018-10-08 NOTE — Patient Instructions (Addendum)
Medication Instructions:  Continue current medications  If you need a refill on your cardiac medications before your next appointment, please call your pharmacy.  Labwork: None Ordered   Testing/Procedures: None Ordered  Follow-Up: . Your physician recommends that you schedule a follow-up appointment in: May 4th @ 9:20 am   At [facility], you and your health needs are our priority.  As part of our continuing mission to provide you with exceptional heart care, we have created designated Provider Care Teams.  These Care Teams include your primary Cardiologist (physician) and Advanced Practice Providers (APPs -  Physician Assistants and Nurse Practitioners) who all work together to provide you with the care you need, when you need it.  Thank you for choosing [facility] at [facility]!!

## 2018-11-04 ENCOUNTER — Telehealth: Payer: Self-pay | Admitting: Cardiology

## 2018-11-04 ENCOUNTER — Ambulatory Visit (INDEPENDENT_AMBULATORY_CARE_PROVIDER_SITE_OTHER): Payer: Medicare HMO

## 2018-11-04 ENCOUNTER — Other Ambulatory Visit: Payer: Self-pay

## 2018-11-04 ENCOUNTER — Ambulatory Visit: Payer: Medicare HMO | Admitting: Adult Health

## 2018-11-04 ENCOUNTER — Encounter: Payer: Self-pay | Admitting: Adult Health

## 2018-11-04 VITALS — BP 142/74 | HR 52 | Ht 61.0 in | Wt 130.0 lb

## 2018-11-04 DIAGNOSIS — I4819 Other persistent atrial fibrillation: Secondary | ICD-10-CM

## 2018-11-04 DIAGNOSIS — J189 Pneumonia, unspecified organism: Secondary | ICD-10-CM | POA: Diagnosis not present

## 2018-11-04 MED ORDER — AMIODARONE HCL 200 MG PO TABS: 200.0000 mg | ORAL_TABLET | Freq: Every day | ORAL | 2 refills | Status: DC

## 2018-11-04 MED ORDER — APIXABAN 2.5 MG PO TABS: 2.5000 mg | ORAL_TABLET | Freq: Two times a day (BID) | ORAL | 1 refills | Status: DC

## 2018-11-04 NOTE — Patient Instructions (Addendum)
Mucinex DM Twice daily  As needed  Cough/congestion  Set up for CT chest without contrast in 2 weeks  Follow up with Dr. Isaiah Serge or Parrett NP in 6-8 weeks with PFT and As needed   Follow up with Cardiology as discussed. We will send message to Cardiology regarding your refills.  Please contact office for sooner follow up if symptoms do not improve or worsen or seek emergency care

## 2018-11-04 NOTE — Addendum Note (Signed)
Addended by: Boone Master E on: 11/04/2018 03:04 PM   Modules accepted: Orders
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
## 2018-11-04 NOTE — Assessment & Plan Note (Signed)
Recent admission with A. fib with RVR.  Abnormal echo with decreased EF at 40 to 45%.  Patient is now on amiodarone and apixaban.  She has been followed by cardiology.  On exam today patient is in a sinus rhythm. No evidence of volume overload.  She will continue follow-up with cardiology messages been sent to their office for refills per patient request

## 2018-11-04 NOTE — Telephone Encounter (Signed)
° ° ° ° °*  STAT* If patient is at the pharmacy, call can be transferred to refill team.   1. Which medications need to be refilled? (please list name of each medication and dose if known) apixaban (ELIQUIS) 2.5 MG TABS tablet, , amiodarone (PACERONE) 200 MG tablet  2. Which pharmacy/location (including street and city if local pharmacy) is medication to be sent to?  [facility] - [place], [place] - [address]   3. Do they need a 30 day or 90 day supply? 90

## 2018-11-04 NOTE — Assessment & Plan Note (Signed)
Most likely atypical pneumonia and abnormal CT changes including consolidation and tree-in-bud nodularity.  Patient has had progressive improvement clinically.  She has no further cough or congestion.  She does have some baseline shortness of breath with activity question if this is related to pulmonary versus cardiac in nature.  Will check PFTs on return.  Chest x-ray today is stable with no notable nodularity or consolidation.  However we will repeat CT chest in 2 weeks to follow these areas if remain present need to consider if she needs to have underlying bronchoscopy with BAL.  If so will need to consider that she is on apixaban.  Plan  Patient Instructions  Mucinex DM Twice daily  As needed  Cough/congestion  Set up for CT chest without contrast in 2 weeks  Follow up with Dr. Isaiah Serge or [place] NP in 6-8 weeks with PFT and As needed   Follow up with Cardiology as discussed. We will send message to Cardiology regarding your refills.  Please contact office for sooner follow up if symptoms do not improve or worsen or seek emergency care

## 2018-11-04 NOTE — Telephone Encounter (Signed)
85 yo, 59kg, Scr 1.92 on 09/26/18 Last OV 10/08/18 Indication afib

## 2018-11-05 ENCOUNTER — Other Ambulatory Visit: Payer: Self-pay

## 2018-11-05 ENCOUNTER — Telehealth: Payer: Self-pay | Admitting: Cardiology

## 2018-11-05 ENCOUNTER — Ambulatory Visit: Payer: Medicare HMO | Admitting: Adult Health

## 2018-11-05 ENCOUNTER — Other Ambulatory Visit: Payer: Self-pay | Admitting: Internal Medicine

## 2018-11-05 MED ORDER — METOPROLOL TARTRATE 25 MG PO TABS: 25.0000 mg | ORAL_TABLET | Freq: Three times a day (TID) | ORAL | 0 refills | Status: DC

## 2018-11-05 NOTE — Telephone Encounter (Signed)
Patient needed refill sent to pharmacy.  RX sent.  No other questions at this time.

## 2018-11-05 NOTE — Telephone Encounter (Signed)
Call patient's smartphone, she said she could hear better on it./consent/ my chart/ pre reg completed

## 2018-11-05 NOTE — Telephone Encounter (Signed)
New Message   Pt c/o medication issue:  1. Name of Medication: metoprolol tartrate (LOPRESSOR) 25 MG tablet    2. How are you currently taking this medication (dosage and times per day)?   3. Are you having a reaction (difficulty breathing--STAT)?   4. What is your medication issue? Patient is calling to see if she still needs to be taken this medication. Please call.

## 2018-11-05 NOTE — Telephone Encounter (Signed)
Called patient, she needed refill on Metoprolol.  Sent in RX to pharmacy.
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
## 2018-11-08 ENCOUNTER — Telehealth (INDEPENDENT_AMBULATORY_CARE_PROVIDER_SITE_OTHER): Payer: Medicare HMO | Admitting: Cardiology

## 2018-11-08 ENCOUNTER — Encounter: Payer: Self-pay | Admitting: Cardiology

## 2018-11-08 VITALS — BP 184/80 | HR 61 | Ht 61.0 in | Wt 130.0 lb

## 2018-11-08 DIAGNOSIS — I2510 Atherosclerotic heart disease of native coronary artery without angina pectoris: Secondary | ICD-10-CM | POA: Diagnosis not present

## 2018-11-08 DIAGNOSIS — I4819 Other persistent atrial fibrillation: Secondary | ICD-10-CM

## 2018-11-08 DIAGNOSIS — N183 Chronic kidney disease, stage 3 unspecified: Secondary | ICD-10-CM

## 2018-11-08 DIAGNOSIS — I10 Essential (primary) hypertension: Secondary | ICD-10-CM

## 2018-11-08 DIAGNOSIS — I1310 Hypertensive heart and chronic kidney disease without heart failure, with stage 1 through stage 4 chronic kidney disease, or unspecified chronic kidney disease: Secondary | ICD-10-CM

## 2018-11-08 DIAGNOSIS — Z7189 Other specified counseling: Secondary | ICD-10-CM

## 2018-11-08 NOTE — Patient Instructions (Signed)
Medication Instructions:  Continue current medications  If you need a refill on your cardiac medications before your next appointment, please call your pharmacy.  Labwork: None Ordered   Testing/Procedures: None Ordered  Follow-Up: . Your physician recommends that you schedule a follow-up appointment in: Keep appointment [name] @ 8:40 am   At [facility], you and your health needs are our priority.  As part of our continuing mission to provide you with exceptional heart care, we have created designated Provider Care Teams.  These Care Teams include your primary Cardiologist (physician) and Advanced Practice Providers (APPs -  Physician Assistants and Nurse Practitioners) who all work together to provide you with the care you need, when you need it.  Thank you for choosing [facility] at [facility]!!

## 2018-11-18 ENCOUNTER — Other Ambulatory Visit: Payer: Self-pay | Admitting: Cardiology

## 2018-11-23 ENCOUNTER — Telehealth: Payer: Self-pay | Admitting: *Deleted

## 2018-11-23 NOTE — Telephone Encounter (Signed)
Verified PT, screening completed and confirmed appointment.  COVID-19 Pre-Screening Questions:  In the past 7 to 10 days have you had a cough, shortness of breath, headache, congestion, fever, body aches, chills, sore throat, or sudden loss of taste or sense of smell? No  Have you been around anyone with known Covid 19. No  Have you been around anyone who is awaiting Covid 19 test results in the past 7 to 10 days? No  Have you been around anyone who has been exposed to Covid 19, or has mentioned symptoms of Covid 19 within the past 7 to 10 days? No If you have any concerns about symptoms your patients report please contact your leadership team, or the provider the patient is seeing in the office for further guidance.

## 2018-11-24 ENCOUNTER — Other Ambulatory Visit: Payer: Self-pay

## 2018-11-24 ENCOUNTER — Ambulatory Visit (INDEPENDENT_AMBULATORY_CARE_PROVIDER_SITE_OTHER)
Admission: RE | Admit: 2018-11-24 | Discharge: 2018-11-24 | Disposition: A | Discharge status: Home / Self Care | Payer: Medicare HMO | Source: Ambulatory Visit | Attending: Adult Health | Admitting: Adult Health

## 2018-11-24 DIAGNOSIS — J189 Pneumonia, unspecified organism: Secondary | ICD-10-CM

## 2018-12-01 ENCOUNTER — Encounter: Payer: Self-pay | Admitting: Adult Health

## 2018-12-01 ENCOUNTER — Other Ambulatory Visit: Payer: Self-pay

## 2018-12-01 ENCOUNTER — Ambulatory Visit: Payer: Medicare HMO | Admitting: Adult Health

## 2018-12-01 VITALS — BP 118/70 | HR 60 | Temp 98.7°F | Ht 61.0 in | Wt 130.4 lb

## 2018-12-01 DIAGNOSIS — I4819 Other persistent atrial fibrillation: Secondary | ICD-10-CM | POA: Diagnosis not present

## 2018-12-01 DIAGNOSIS — J189 Pneumonia, unspecified organism: Secondary | ICD-10-CM

## 2018-12-01 LAB — CBC WITH DIFFERENTIAL/PLATELET
Basophils Absolute: 0.1 10*3/uL (ref 0.0–0.1)
Basophils Relative: 1 % (ref 0.0–3.0)
Eosinophils Absolute: 0.2 10*3/uL (ref 0.0–0.7)
Eosinophils Relative: 1.5 % (ref 0.0–5.0)
HCT: 30.3 % — ABNORMAL LOW (ref 36.0–46.0)
Hemoglobin: 9.4 g/dL — ABNORMAL LOW (ref 12.0–15.0)
Lymphocytes Relative: 32.4 % (ref 12.0–46.0)
Lymphs Abs: 3.9 10*3/uL (ref 0.7–4.0)
MCHC: 31.1 g/dL (ref 30.0–36.0)
MCV: 72.2 fl — ABNORMAL LOW (ref 78.0–100.0)
Monocytes Absolute: 0.5 10*3/uL (ref 0.1–1.0)
Monocytes Relative: 4.4 % (ref 3.0–12.0)
Neutro Abs: 7.2 10*3/uL (ref 1.4–7.7)
Neutrophils Relative %: 60.7 % (ref 43.0–77.0)
Platelets: 219 10*3/uL (ref 150.0–400.0)
RBC: 4.19 Mil/uL (ref 3.87–5.11)
RDW: 18.5 % — ABNORMAL HIGH (ref 11.5–15.5)
WBC: 11.9 10*3/uL — ABNORMAL HIGH (ref 4.0–10.5)

## 2018-12-01 LAB — BASIC METABOLIC PANEL
BUN: 21 mg/dL (ref 6–23)
CO2: 26 mEq/L (ref 19–32)
Calcium: 9.6 mg/dL (ref 8.4–10.5)
Chloride: 103 mEq/L (ref 96–112)
Creatinine, Ser: 1.46 mg/dL — ABNORMAL HIGH (ref 0.40–1.20)
GFR: 34.13 mL/min — ABNORMAL LOW (ref >60.00)
Glucose, Bld: 101 mg/dL — ABNORMAL HIGH (ref 70–99)
Potassium: 3.9 mEq/L (ref 3.5–5.1)
Sodium: 136 mEq/L (ref 135–145)

## 2018-12-01 NOTE — Patient Instructions (Addendum)
Mucinex DM Twice daily  As needed  Cough/congestion  Follow up with Dr. Isaiah Serge or Larnce Schnackenberg NP in 3-4 months  Labs today .  Please follow with Cardiology as planned, please discuss finding of thoracic aortic aneurysm that will need to be followed annually .  If you have back pain discuss with Primary MD that new compression fracture was noted on CT scan .  Please contact office for sooner follow up if symptoms do not improve or worsen or seek emergency care

## 2018-12-01 NOTE — Assessment & Plan Note (Signed)
CT chest has improved with near resolution of LUL and resolving infectious process with decreased opacities   Incidental findings of compression fx and TAA , need follow up with PCP and Cardiology for ongoing follow up .

## 2018-12-01 NOTE — Assessment & Plan Note (Addendum)
Controlled on current regimen  Now on amiodarone and eliquis  Check PFT on return ([facility]CO )  Needs CBC /bmet as no labs since discharge, chronic anemia

## 2018-12-01 NOTE — Assessment & Plan Note (Signed)
Resolved.  CT shows clearing of process  No further imaging indicated.
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
## 2018-12-01 NOTE — Addendum Note (Signed)
Addended by: Demetrio Lapping E on: 12/01/2018 11:20 AM   Modules accepted: Orders

## 2018-12-02 NOTE — Progress Notes (Signed)
Called spoke with patient, advised of lab results / recs as stated by Parrett NP.  Pt verbalized understanding and denied any questions.  Results faxed to PCP via Epic.

## 2018-12-20 ENCOUNTER — Telehealth: Payer: Self-pay | Admitting: Cardiology

## 2018-12-20 NOTE — Telephone Encounter (Signed)
smartphone/ consent/ my chart/ pre reg completed °

## 2018-12-23 ENCOUNTER — Ambulatory Visit: Payer: Medicare HMO | Admitting: Adult Health
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
## 2018-12-24 ENCOUNTER — Other Ambulatory Visit: Payer: Self-pay | Admitting: Cardiology

## 2018-12-24 ENCOUNTER — Telehealth (INDEPENDENT_AMBULATORY_CARE_PROVIDER_SITE_OTHER): Payer: Medicare HMO | Admitting: Cardiology

## 2018-12-24 ENCOUNTER — Encounter: Payer: Self-pay | Admitting: Cardiology

## 2018-12-24 ENCOUNTER — Telehealth: Payer: Medicare HMO | Admitting: Cardiology

## 2018-12-24 ENCOUNTER — Telehealth: Payer: Self-pay | Admitting: *Deleted

## 2018-12-24 VITALS — BP 150/66 | HR 65 | Ht 61.0 in | Wt 130.0 lb

## 2018-12-24 DIAGNOSIS — I10 Essential (primary) hypertension: Secondary | ICD-10-CM

## 2018-12-24 DIAGNOSIS — Z952 Presence of prosthetic heart valve: Secondary | ICD-10-CM

## 2018-12-24 DIAGNOSIS — N183 Chronic kidney disease, stage 3 unspecified: Secondary | ICD-10-CM

## 2018-12-24 DIAGNOSIS — Z7189 Other specified counseling: Secondary | ICD-10-CM | POA: Diagnosis not present

## 2018-12-24 DIAGNOSIS — I4819 Other persistent atrial fibrillation: Secondary | ICD-10-CM

## 2018-12-24 DIAGNOSIS — I2510 Atherosclerotic heart disease of native coronary artery without angina pectoris: Secondary | ICD-10-CM

## 2018-12-24 NOTE — Patient Instructions (Signed)
Medication Instructions:  Your physician recommends that you continue on your current medications as directed. Please refer to the Current Medication list given to you today.  If you need a refill on your cardiac medications before your next appointment, please call your pharmacy.   Lab work: NONE  Testing/Procedures: NONE  Follow-Up: Your physician recommends that you schedule a follow-up appointment in: 2 MONTHS IN THE OFFICE WITH DR HOCHREIN    

## 2018-12-24 NOTE — Telephone Encounter (Signed)
Left message to call back do discuss AVS from today's visit

## 2018-12-28 ENCOUNTER — Telehealth: Payer: Self-pay | Admitting: Pulmonary Disease

## 2018-12-28 NOTE — Telephone Encounter (Signed)
Spoke with patient. Advised her that it appears that an appointment error had been made and someone was calling to let her know that her OV would be in August instead of this month. She verbalized understanding. Nothing further needed at time of call.

## 2019-01-03 NOTE — Telephone Encounter (Signed)
Advised patient

## 2019-02-02 ENCOUNTER — Other Ambulatory Visit: Payer: Self-pay | Admitting: Pulmonary Disease

## 2019-02-09 DIAGNOSIS — R42 Dizziness and giddiness: Secondary | ICD-10-CM | POA: Insufficient documentation

## 2019-02-10 ENCOUNTER — Other Ambulatory Visit (HOSPITAL_COMMUNITY)
Admission: RE | Admit: 2019-02-10 | Discharge: 2019-02-10 | Disposition: A | Discharge status: Home / Self Care | Payer: Medicare HMO | Source: Ambulatory Visit | Attending: Pulmonary Disease | Admitting: Pulmonary Disease

## 2019-02-10 DIAGNOSIS — Z01812 Encounter for preprocedural laboratory examination: Secondary | ICD-10-CM | POA: Insufficient documentation

## 2019-02-10 DIAGNOSIS — Z20828 Contact with and (suspected) exposure to other viral communicable diseases: Secondary | ICD-10-CM | POA: Diagnosis not present

## 2019-02-10 LAB — SARS CORONAVIRUS 2 (TAT 6-24 HRS): SARS Coronavirus 2: NEGATIVE

## 2019-02-11 NOTE — Progress Notes (Signed)
SARS-CoV-2 negative.  This is good news.  Ibrohim Simmers, FNP
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
## 2019-02-14 ENCOUNTER — Ambulatory Visit: Payer: Medicare HMO | Admitting: Adult Health

## 2019-02-14 ENCOUNTER — Ambulatory Visit: Payer: Medicare HMO | Admitting: Pulmonary Disease

## 2019-02-14 ENCOUNTER — Encounter: Payer: Self-pay | Admitting: Pulmonary Disease

## 2019-02-14 ENCOUNTER — Other Ambulatory Visit: Payer: Self-pay

## 2019-02-14 ENCOUNTER — Ambulatory Visit (INDEPENDENT_AMBULATORY_CARE_PROVIDER_SITE_OTHER): Payer: Medicare HMO | Admitting: Pulmonary Disease

## 2019-02-14 VITALS — BP 148/76 | HR 52 | Temp 98.3°F | Ht 62.0 in | Wt 132.0 lb

## 2019-02-14 DIAGNOSIS — J309 Allergic rhinitis, unspecified: Secondary | ICD-10-CM | POA: Insufficient documentation

## 2019-02-14 DIAGNOSIS — J189 Pneumonia, unspecified organism: Secondary | ICD-10-CM

## 2019-02-14 DIAGNOSIS — R9389 Abnormal findings on diagnostic imaging of other specified body structures: Secondary | ICD-10-CM

## 2019-02-14 LAB — PULMONARY FUNCTION TEST
DL/VA % pred: 85 %
DL/VA: 3.52 ml/min/mmHg/L
DLCO unc % pred: 63 %
DLCO unc: 10.55 ml/min/mmHg
FEF 25-75 Pre: 0.52 L/sec
FEF2575-%Pred-Pre: 50 %
FEV1-%Pred-Pre: 53 %
FEV1-Pre: 0.82 L
FEV1FVC-%Pred-Pre: 95 %
FEV6-%Pred-Pre: 59 %
FEV6-Pre: 1.15 L
FEV6FVC-%Pred-Pre: 104 %
FVC-%Pred-Pre: 56 %
FVC-Pre: 1.18 L
Pre FEV1/FVC ratio: 70 %
Pre FEV6/FVC Ratio: 98 %

## 2019-02-14 MED ORDER — FLUTICASONE PROPIONATE 50 MCG/ACT NA SUSP: 1.0000 | Freq: Every day | NASAL | 3 refills | Status: DC

## 2019-02-14 NOTE — Patient Instructions (Addendum)
Walk in office today   Start Flonase / fluticasone nasal spray daily for the next 5 days then as needed   Please start taking a daily antihistamine:  >>>choose one of: zyrtec, claritin, allegra, or xyzal  >>>these are over the counter medications  >>>can choose generic option  >>>take daily  >>>this medication helps with allergies, post nasal drip, and cough   Return in about 6 weeks (around 03/28/2019), or if symptoms worsen or fail to improve, for Follow up with Dr. Isaiah SergeMannam.   Coronavirus (COVID-19) Are you at risk?  Are you at risk for the Coronavirus (COVID-19)?  To be considered HIGH RISK for Coronavirus (COVID-19), you have to meet the following criteria:  . Traveled to [place], [place], [place], [place] or [place]; or in the [place] to [place], [place], [place], or [place]; and have fever, cough, and shortness of breath within the last 2 weeks of travel OR . Been in close contact with a person diagnosed with COVID-19 within the last 2 weeks and have fever, cough, and shortness of breath . IF YOU DO NOT MEET THESE CRITERIA, YOU ARE CONSIDERED LOW RISK FOR COVID-19.  What to do if you are HIGH RISK for COVID-19?  Marland Kitchen. If you are having a medical emergency, call 911. . Seek medical care right away. Before you go to a doctor's office, urgent care or emergency department, call ahead and tell them about your recent travel, contact with someone diagnosed with COVID-19, and your symptoms. You should receive instructions from your physician's office regarding next steps of care.  . When you arrive at healthcare provider, tell the healthcare staff immediately you have returned from visiting [place], [place], [place], [place] or [place]; or traveled in the [place] to [place], [place], [place], or [place]; in the last two weeks or you have been in close contact with a person diagnosed with COVID-19 in the last 2 weeks.   . Tell the health care staff about your symptoms:  fever, cough and shortness of breath. . After you have been seen by a medical provider, you will be either: o Tested for (COVID-19) and discharged home on quarantine except to seek medical care if symptoms worsen, and asked to  - Stay home and avoid contact with others until you get your results (4-5 days)  - Avoid travel on public transportation if possible (such as bus, train, or airplane) or o Sent to the Emergency Department by EMS for evaluation, COVID-19 testing, and possible admission depending on your condition and test results.  What to do if you are LOW RISK for COVID-19?  Reduce your risk of any infection by using the same precautions used for avoiding the common cold or flu:  Marland Kitchen. Wash your hands often with soap and warm water for at least 20 seconds.  If soap and water are not readily available, use an alcohol-based hand sanitizer with at least 60% alcohol.  . If coughing or sneezing, cover your mouth and nose by coughing or sneezing into the elbow areas of your shirt or coat, into a tissue or into your sleeve (not your hands). . Avoid shaking hands with others and consider head nods or verbal greetings only. . Avoid touching your eyes, nose, or mouth with unwashed hands.  . Avoid close contact with people who are sick. . Avoid places or events with large numbers of people in one location, like concerts or sporting events. . Carefully consider travel plans you have  or are making. . If you are planning any travel outside or inside the [place], visit the CDC's Travelers' Health webpage for the latest health notices. . If you have some symptoms but not all symptoms, continue to monitor at home and seek medical attention if your symptoms worsen. . If you are having a medical emergency, call 911.   [place] / e-Visit: [email or web address]         [facility]: [facility]: [phone]                   [facility]: [phone]           It is flu season:   >>> Best ways to protect herself from the flu: Receive the yearly flu vaccine, practice good hand hygiene washing with soap and also using hand sanitizer when available, eat a nutritious meals, get adequate rest, hydrate appropriately   Please contact the office if your symptoms worsen or you have concerns that you are not improving.   Thank you for choosing [facility] for your healthcare, and for allowing [place] to partner with you on your healthcare journey. I am thankful to be able to provide care to you today.   Wyn Quaker FNP-C

## 2019-02-14 NOTE — Assessment & Plan Note (Signed)
Most recent CT in May/2020 stable showing clearing of pneumonia potential MAI  Plan: Continue to monitor clinically

## 2019-02-14 NOTE — Assessment & Plan Note (Signed)
Plan: Start Flonase 1 spray each nostril daily for the next 5 days for nasal congestion allergy-like symptoms, then can use as needed Start daily antihistamine  If symptoms worsen or pain develops in the sinuses please contact our office and we can consider antibiotic therapy  Follow-up with our office in 6 to 8 weeks

## 2019-02-14 NOTE — Progress Notes (Signed)
Spirometry and Dlco done today. 

## 2019-02-14 NOTE — Assessment & Plan Note (Signed)
CT showing improvement Patient not coughing up any discolored mucus  Plan: Continue to monitor clinically If the patient starts coughing up discolored mucus we should culture this Patient knows to contact our office if this starts Follow-up with our office in 6 to 8 weeks with Dr. Vaughan Browner
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
## 2019-02-18 ENCOUNTER — Other Ambulatory Visit: Payer: Self-pay

## 2019-02-18 ENCOUNTER — Encounter: Payer: Self-pay | Admitting: Cardiology

## 2019-02-18 ENCOUNTER — Ambulatory Visit: Payer: Medicare HMO | Admitting: Cardiology

## 2019-02-18 VITALS — BP 144/72 | HR 64 | Temp 96.4°F | Wt 132.8 lb

## 2019-02-18 DIAGNOSIS — N183 Chronic kidney disease, stage 3 unspecified: Secondary | ICD-10-CM

## 2019-02-18 DIAGNOSIS — I712 Thoracic aortic aneurysm, without rupture, unspecified: Secondary | ICD-10-CM | POA: Insufficient documentation

## 2019-02-18 DIAGNOSIS — I4819 Other persistent atrial fibrillation: Secondary | ICD-10-CM

## 2019-02-18 DIAGNOSIS — I2510 Atherosclerotic heart disease of native coronary artery without angina pectoris: Secondary | ICD-10-CM

## 2019-02-18 DIAGNOSIS — Z952 Presence of prosthetic heart valve: Secondary | ICD-10-CM

## 2019-02-18 NOTE — Patient Instructions (Signed)
Medication Instructions:  TRY SCOPOLAMINE PATCH APPLY EVERY 72 HOURS AS NEEDED   If you need a refill on your cardiac medications before your next appointment, please call your pharmacy.   Lab work: NONE  Testing/Procedures: NONE   Follow-Up: At [facility], you and your health needs are our priority.  As part of our continuing mission to provide you with exceptional heart care, we have created designated Provider Care Teams.  These Care Teams include your primary Cardiologist (physician) and Advanced Practice Providers (APPs -  Physician Assistants and Nurse Practitioners) who all work together to provide you with the care you need, when you need it. You will need a follow up appointment in 12 months.  Please call our office 2 months in advance to schedule this appointment.  You may see Minus Breeding, MD or one of the following Advanced Practice Providers on your designated Care Team:   Rosaria Ferries, PA-C . Jory Sims, DNP, ANP

## 2019-03-29 ENCOUNTER — Encounter: Payer: Self-pay | Admitting: *Deleted

## 2019-03-29 NOTE — Telephone Encounter (Signed)
This encounter was created in error - please disregard.  This encounter was created in error - please disregard.

## 2019-03-30 ENCOUNTER — Ambulatory Visit: Payer: Medicare HMO | Admitting: Pulmonary Disease

## 2019-03-30 ENCOUNTER — Encounter: Payer: Self-pay | Admitting: Pulmonary Disease

## 2019-03-30 ENCOUNTER — Telehealth: Payer: Self-pay

## 2019-03-30 ENCOUNTER — Other Ambulatory Visit: Payer: Self-pay

## 2019-03-30 VITALS — BP 118/70 | HR 60 | Temp 96.3°F | Ht 61.0 in | Wt 129.6 lb

## 2019-03-30 DIAGNOSIS — R05 Cough: Secondary | ICD-10-CM | POA: Diagnosis not present

## 2019-03-30 DIAGNOSIS — R059 Cough, unspecified: Secondary | ICD-10-CM

## 2019-03-30 MED ORDER — FLUTTER DEVI: 0 refills | Status: DC

## 2019-03-30 NOTE — Telephone Encounter (Signed)
Clinical pharmacist to review eliquis 

## 2019-03-30 NOTE — Telephone Encounter (Signed)
Patient with diagnosis of afib/tissue AVR then TAVR on Eliquis for anticoagulation.    Procedure: Colonoscopy and EGD Date of procedure: TBD  CHADS2-VASc score of  6 (CHF, HTN, AGE, CAD, AGE, female)  CrCl 23 ml/min  Per office protocol, patient can hold Eliquis for 1.5-2 days prior to procedure.

## 2019-03-30 NOTE — Patient Instructions (Signed)
Glad you are doing well with regard to your breathing. We will give you flutter valve for clearance of secretion Follow-up in 6 months.

## 2019-03-30 NOTE — Telephone Encounter (Signed)
   [facility] Pre-operative Risk Assessment      [facility] Pre-operative Risk Assessment    Request for surgical clearance:  1. What type of surgery is being performed? Colonoscopy and EGD  2. When is this surgery scheduled? TBD  3. What type of clearance is required (medical clearance vs. Pharmacy clearance to hold med vs. Both)? Both  4. Are there any medications that need to be held prior to surgery and how long? Eliquis  5. Practice name and name of physician performing surgery? [facility]  6. What is your office phone number [phone]   7.   What is your office fax number [phone]  8.   Anesthesia type Not listed   Kathyrn Lass 03/30/2019, 11:04 AM  _________________________________________________________________   (provider comments below)

## 2019-03-30 NOTE — Telephone Encounter (Signed)
   Primary Cardiologist: Minus Breeding, MD  Chart reviewed as part of pre-operative protocol coverage. Patient was contacted 03/30/2019 in reference to pre-operative risk assessment for pending surgery as outlined below.  [name] was last seen on 02/18/2019 by Dr. Percival Spanish.  Since that day, [name] has done well.  Therefore, based on ACC/AHA guidelines, the patient would be at acceptable risk for the planned procedure without further cardiovascular testing.   I will route this recommendation to the requesting party via Epic fax function and remove from pre-op pool.  Please call with questions. See below regarding how long to hold eliquis as recommended by our clinical pharmacist. I have called the patient and informed her   Almyra Deforest, [place] 03/30/2019, 5:17 PM

## 2019-03-30 NOTE — Progress Notes (Signed)
Embree E Mendonsa    [id number]    07-26-1932  Primary Care Physician:Witten, Lillard Anes, MD  [place]: [place] [address] [address] [place],  [place] [address]  Chief complaint: Follow-up for bronchiectasis.  HPI: 85 year old with history of aortic stenosis, coronary artery disease status post aortic valve replacement and CABG, chronic kidney disease, dyslipidemia.    Seen in pulmonary clinic initially in February 2020 for atypical pneumonia with CT scan findings concerning for MAI infection.  Bronchoscopy was scheduled but had to be canceled due to [place] pandemic She subsequently got hospitalized in March 2020, ruled out for COVID-19 infection.  Treated with antibiotics Since her hospitalization she has had an improvement in symptoms.  Follow-up CT scan shows improvement in lung opacities.  Interim history: Here for follow-up.  States that overall her breathing is improved.  Denies any cough, sputum production, fevers, chills  She is being evaluated for iron deficiency anemia and is scheduled to get EGD, colonoscopy.  Outpatient Encounter Medications as of 03/30/2019  Medication Sig  . acetaminophen (TYLENOL) 500 MG tablet Take 500 mg by mouth every 6 (six) hours as needed.  Marland Kitchen amiodarone (PACERONE) 200 MG tablet Take 1 tablet (200 mg total) by mouth daily.  Marland Kitchen apixaban (ELIQUIS) 2.5 MG TABS tablet Take 1 tablet (2.5 mg total) by mouth 2 (two) times daily.  Marland Kitchen atorvastatin (LIPITOR) 80 MG tablet Take 80 mg by mouth daily.  . Cholecalciferol (VITAMIN D3) 25 MCG (1000 UT) CAPS Take 2,000 Units by mouth daily.  . fluticasone (FLONASE) 50 MCG/ACT nasal spray Place 1 spray into both nostrils daily.  . metoprolol tartrate (LOPRESSOR) 25 MG tablet TAKE 1 TABLET BY MOUTH EVERY 8 HOURS  . nitroGLYCERIN (NITROSTAT) 0.4 MG SL tablet Place 1 tablet (0.4 mg total) under the tongue every 5 (five) minutes x 3 doses as needed for chest pain.  . pantoprazole (PROTONIX) 40 MG tablet  Take 1 tablet by mouth once daily  . scopolamine (TRANSDERM-SCOP) 1 MG/3DAYS Place 1 patch onto the skin every 3 (three) days. AS NEEDED  . timolol (TIMOPTIC) 0.5 % ophthalmic solution Place 1 drop into both eyes 2 (two) times daily.  Marland Kitchen zolpidem (AMBIEN) 5 MG tablet Take 5 mg by mouth at bedtime as needed for sleep.    No facility-administered encounter medications on file as of 03/30/2019.    Physical Exam: Blood pressure 132/74, pulse 97, height 5\' 1"  (1.549 m), weight 136 lb 6.4 oz (61.9 kg), SpO2 96 %. Gen:      No acute distress HEENT:  EOMI, sclera anicteric Neck:     No masses; no thyromegaly Lungs:    Clear to auscultation bilaterally; normal respiratory effort CV:         Regular rate and rhythm; no murmurs Abd:      + bowel sounds; soft, non-tender; no palpable masses, no distension Ext:    No edema; adequate peripheral perfusion Skin:      Warm and dry; no rash Neuro: alert and oriented x 3 Psych: normal mood and affect  Data Reviewed: Imaging: CT coronaries 07/20/2014- Visualized lung areas show mild areas of bronchiectasis, peribronchovascular micronodular laboratory with tree-in-bud appearance bilaterally.  CTA 08/23/2018-Underlying emphysema, bilateral tree-in-bud opacity in the upper lobe, middle lobe and lingula.  No bronchiectasis or interstitial lung disease.  CT chest 11/24/2018- significant interval improvement in pulmonary opacities.  New compression fracture of T7, ascending thoracic aortic aneurysm measuring 5 cm.  PFTs:  02/14/2019 FVC 1.18 [56%], FEV1 0.82 [53%],/F 70, DLCO 10.55 [63%] Moderate-severe obstruction with moderate diffusion defect.  Assessment:  Bronchiectasis, atypical pneumonia Lung imaging is suggestive of MAI infection however symptomatically she is improved and follow-up CT scan shows improvement in lung opacities. We will continue monitoring and hold off bronchoscopy or treatment. No need for inhalers as she is asymptomatic Give flutter  valve for mucociliary clearance  Plan/Recommendations: - Flutter valve Follow-up in 6 months.  Chilton Greathouse MD [facility] 03/30/2019, 9:23 AM  CC: Oletha Blend, MD

## 2019-04-14 ENCOUNTER — Other Ambulatory Visit: Payer: Self-pay | Admitting: Cardiology

## 2019-04-14 NOTE — Telephone Encounter (Signed)
Refill Request.  

## 2019-04-30 ENCOUNTER — Other Ambulatory Visit: Payer: Self-pay | Admitting: Cardiology

## 2019-05-02 ENCOUNTER — Other Ambulatory Visit: Payer: Self-pay

## 2019-06-15 IMAGING — DX CHEST - 2 VIEW
2 series · 2 of 2 positions shown · non-contrast
Comparison: September 21, 2018 and August 27, 2018

CLINICAL DATA: Recent pneumonia

EXAM:
CHEST - 2 VIEW

[chest pa]
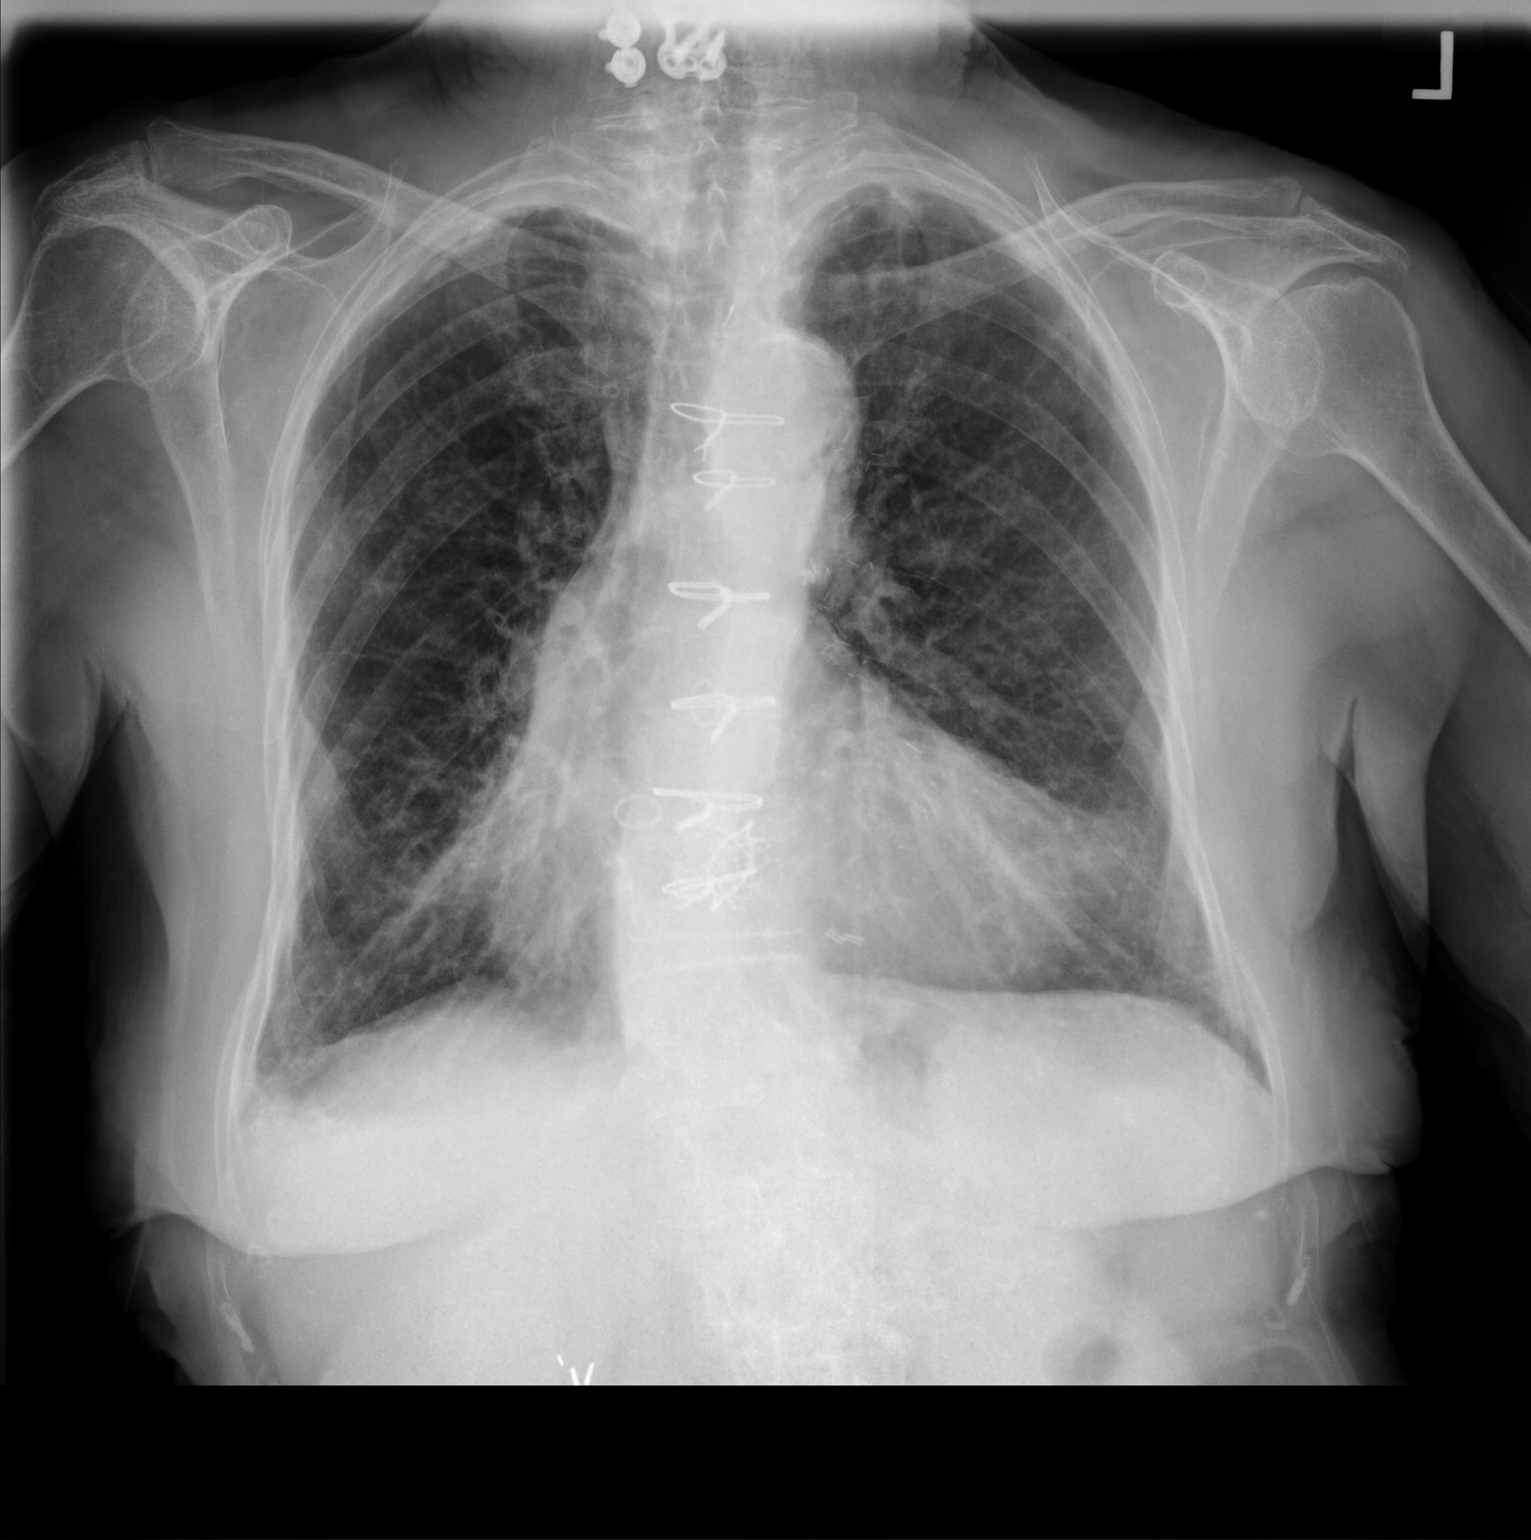

[chest lat]
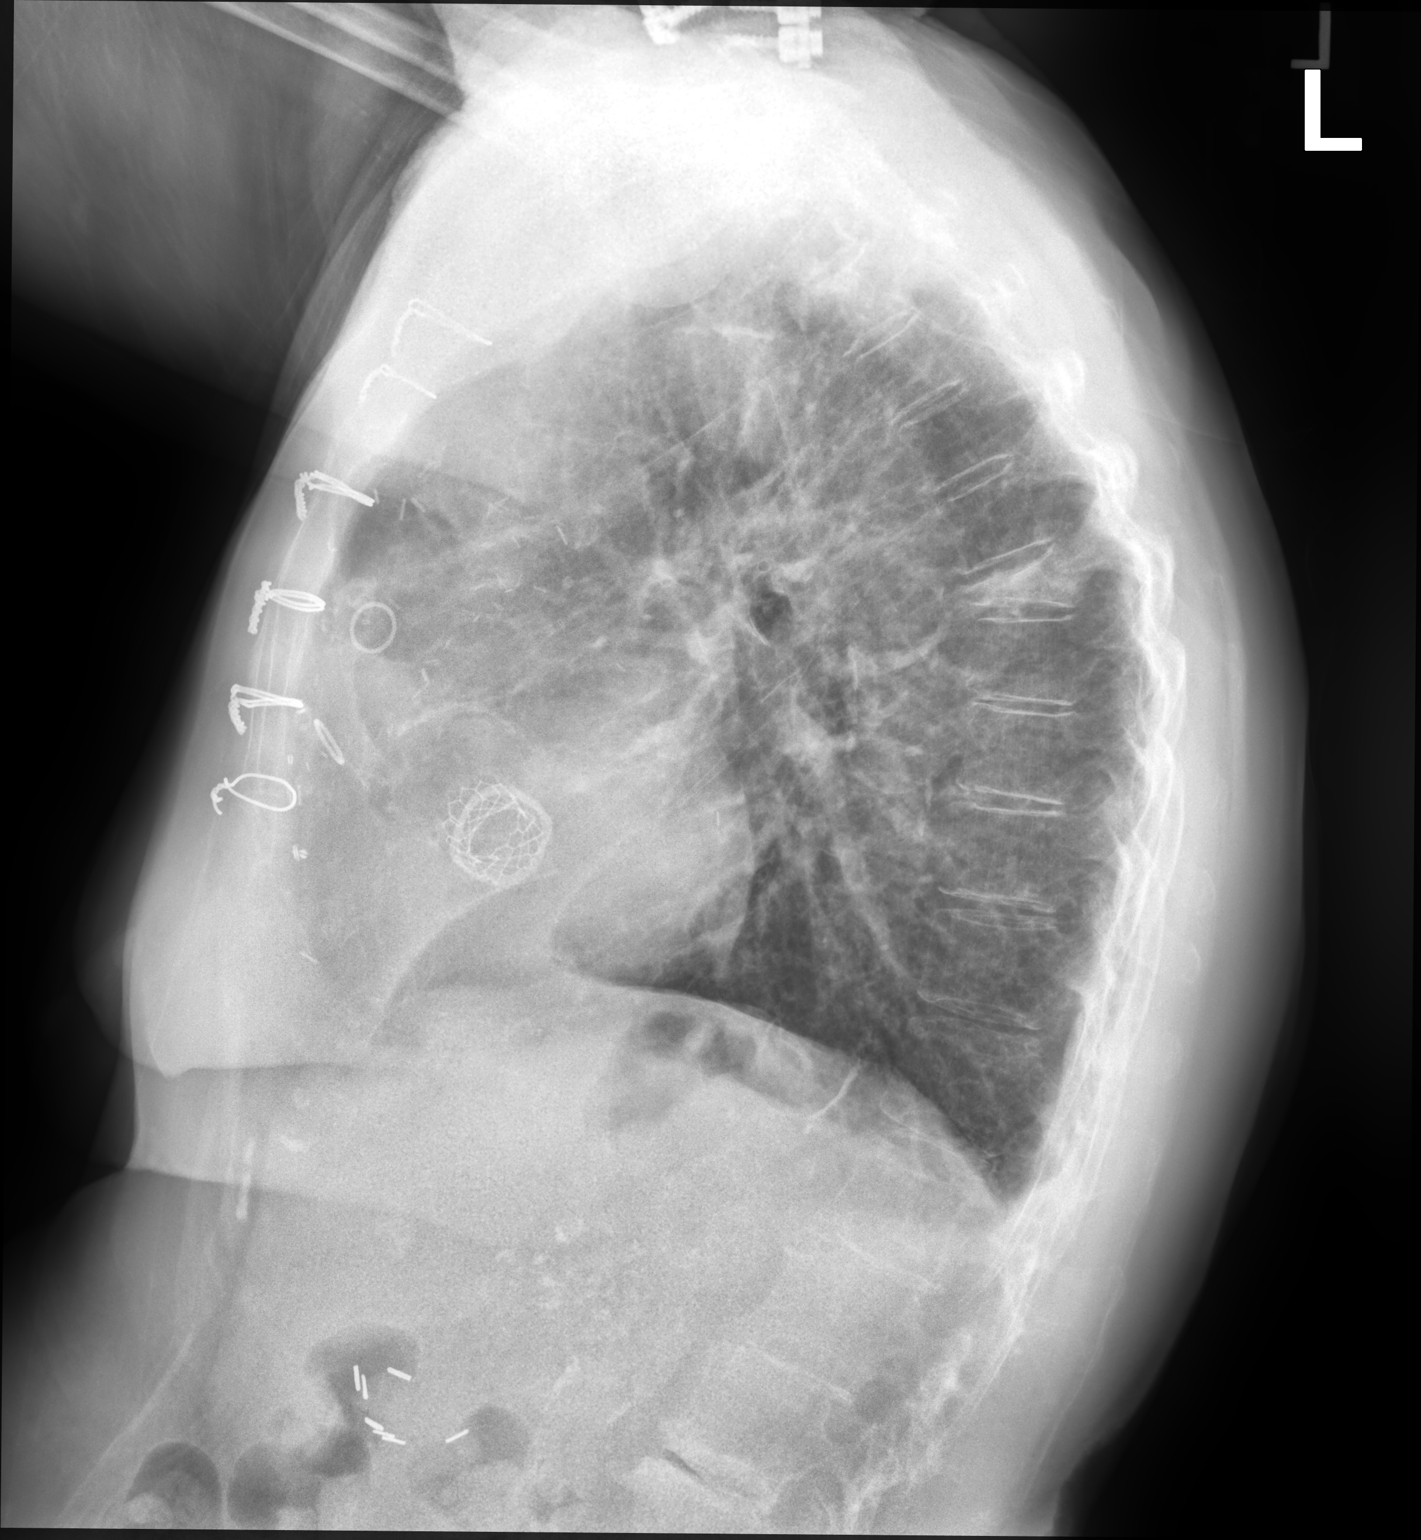

[2 of 2 positions shown; findings below may reference images not displayed]

FINDINGS: There is interstitial thickening in the bases, likely secondary to
scarring. There is no frank edema or consolidation. Heart is upper
normal in size with pulmonary vascularity normal. Patient is status
post coronary artery bypass grafting and aortic valve replacement.
There is aortic atherosclerosis. No adenopathy. There is stable
wedging of the T7 vertebral body. There is postoperative change in
the lower cervical region.
IMPRESSION: No edema or consolidation. Scarring in the lung base regions. Stable
cardiac silhouette. Postoperative changes noted. Stable wedging of
the T7 vertebral body. Aortic Atherosclerosis ([id number]).

## 2019-07-28 ENCOUNTER — Other Ambulatory Visit: Payer: Self-pay | Admitting: Cardiology

## 2019-07-28 NOTE — Telephone Encounter (Signed)
Please review for refill on Eliquis  

## 2019-08-04 ENCOUNTER — Telehealth: Payer: Self-pay | Admitting: Cardiology

## 2019-08-04 ENCOUNTER — Other Ambulatory Visit: Payer: Self-pay | Admitting: Cardiology

## 2019-08-04 NOTE — Telephone Encounter (Signed)
Pt has several upcoming appts and wanted to push this one out .[name]

## 2019-08-04 NOTE — Telephone Encounter (Signed)
Patient calling stating she had a CT scan and they told her she had an aneurism close to her heart. She says she is supposed to go to a doctor in [place], but she would prefer to see Dr. Barry Dienes or Dr. Excell Seltzer since they did her last surgery. She would like to discuss this with Dr. Antoine Poche.

## 2019-08-04 NOTE — Telephone Encounter (Signed)
Spoke with pt for lengthy amount of time Per pt had ct done a couple of weeks ago and this was ordered by PCP and was noted that pt had aneurysm Discussed with pt after reviewing previous Ct scans that this is noted on 5/20 scan and measures 5 cm Called and spoke with Sheria Lang at Dr Orvil Feil office and they had scheduled pt with a appt for cardiothoracic surgeon in [place] based on aneurysm meassuring 5 cm PCP was not aware pt has cardiologist and stated pt may cancel appt in [facility] as is bing followed here Called pt back and pt will call and cancel upcoming appt In the meantime appt made with Dr Antoine Poche for 09/15/19 to discuss ct ./cy

## 2019-08-04 NOTE — Telephone Encounter (Signed)
We could move that appt up to next week.  Plenty of spots.

## 2019-08-30 DIAGNOSIS — H9313 Tinnitus, bilateral: Secondary | ICD-10-CM | POA: Insufficient documentation
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
## 2019-09-15 ENCOUNTER — Ambulatory Visit: Payer: Medicare HMO | Admitting: Cardiology

## 2019-09-15 ENCOUNTER — Other Ambulatory Visit: Payer: Self-pay

## 2019-09-15 ENCOUNTER — Encounter: Payer: Self-pay | Admitting: Cardiology

## 2019-09-15 VITALS — BP 110/60 | HR 97 | Temp 94.8°F | Ht 60.0 in | Wt 126.8 lb

## 2019-09-15 DIAGNOSIS — I712 Thoracic aortic aneurysm, without rupture: Secondary | ICD-10-CM

## 2019-09-15 DIAGNOSIS — D649 Anemia, unspecified: Secondary | ICD-10-CM | POA: Insufficient documentation

## 2019-09-15 DIAGNOSIS — Z7189 Other specified counseling: Secondary | ICD-10-CM

## 2019-09-15 DIAGNOSIS — I10 Essential (primary) hypertension: Secondary | ICD-10-CM

## 2019-09-15 DIAGNOSIS — N1831 Chronic kidney disease, stage 3a: Secondary | ICD-10-CM

## 2019-09-15 DIAGNOSIS — Z951 Presence of aortocoronary bypass graft: Secondary | ICD-10-CM | POA: Diagnosis not present

## 2019-09-15 DIAGNOSIS — I480 Paroxysmal atrial fibrillation: Secondary | ICD-10-CM

## 2019-09-15 DIAGNOSIS — Z952 Presence of prosthetic heart valve: Secondary | ICD-10-CM | POA: Diagnosis not present

## 2019-09-15 DIAGNOSIS — I2510 Atherosclerotic heart disease of native coronary artery without angina pectoris: Secondary | ICD-10-CM | POA: Diagnosis not present

## 2019-09-15 DIAGNOSIS — I7121 Aneurysm of the ascending aorta, without rupture: Secondary | ICD-10-CM

## 2019-09-15 NOTE — Patient Instructions (Signed)
Medication Instructions:  No changes *If you need a refill on your cardiac medications before your next appointment, please call your pharmacy*  Lab Work: None needed this visit  Testing/Procedures: Your physician has requested that you have an echocardiogram. Echocardiography is a painless test that uses sound waves to create images of your heart. It provides your doctor with information about the size and shape of your heart and how well your heart's chambers and valves are working. This procedure takes approximately one hour. There are no restrictions for this procedure. [address]  Follow-Up: At [facility], you and your health needs are our priority.  As part of our continuing mission to provide you with exceptional heart care, we have created designated Provider Care Teams.  These Care Teams include your primary Cardiologist (physician) and Advanced Practice Providers (APPs -  Physician Assistants and Nurse Practitioners) who all work together to provide you with the care you need, when you need it.  We recommend signing up for the patient portal called "MyChart".  Sign up information is provided on this After Visit Summary.  MyChart is used to connect with patients for Virtual Visits (Telemedicine).  Patients are able to view lab/test results, encounter notes, upcoming appointments, etc.  Non-urgent messages can be sent to your provider as well.   To learn more about what you can do with MyChart, go to [email or web address].    Your next appointment:   1 year(s)  The format for your next appointment:   In Person  Provider:   [name], MD

## 2019-10-03 ENCOUNTER — Ambulatory Visit (HOSPITAL_COMMUNITY): Payer: Medicare HMO | Attending: Cardiology

## 2019-10-03 ENCOUNTER — Other Ambulatory Visit: Payer: Self-pay

## 2019-10-03 DIAGNOSIS — Z952 Presence of prosthetic heart valve: Secondary | ICD-10-CM | POA: Diagnosis not present

## 2019-10-17 ENCOUNTER — Telehealth: Payer: Self-pay | Admitting: Cardiology

## 2019-10-17 NOTE — Telephone Encounter (Signed)
ED note in epic with recommendations Will route to CVRR to review and advise

## 2019-10-17 NOTE — Telephone Encounter (Signed)
Pt c/o medication issue:  1. Name of Medication: ELIQUIS 2.5 MG TABS tablet  2. How are you currently taking this medication (dosage and times per day)? 2.5 mg 2x daily   3. Are you having a reaction (difficulty breathing--STAT)? Yes  4. What is your medication issue? Nosebleed that won't stop. The patient had to go to [facility] last night by Ambulance

## 2019-10-18 NOTE — Telephone Encounter (Signed)
Returned call to patient. She held a few doses of Eliquis on her own.   Reviewed instructions from ED visit with patient Angela Dolphin, MD - 10/16/2019  I would recommend that you keep in the [facility] until you are evaluated by Dr. Christell Constant, our ear nose and throat physician. Make sure that you call his office first thing in the morning to see if he can squeeze you in later on in the day. Take the antibiotic as prescribed to you for the next few days until the [facility] is removed. I would also recommend that you try to avoid excessive blowing or picking of your nose. Make sure that you apply Vaseline to the anterior nostrils at nighttime to help soften the skin in the nose and prevent it from drying out and therefore causing additional bleeds.    She has ENT appt to have rhino rocket removed today @ 2:15pm  Message sent to MD & RN as well as CVVRR (send 4/12)

## 2019-10-18 NOTE — Telephone Encounter (Signed)
Patient calling back.   °

## 2019-10-20 ENCOUNTER — Other Ambulatory Visit: Payer: Self-pay | Admitting: Cardiology

## 2019-10-20 NOTE — Telephone Encounter (Signed)
May this be refilled I saw where they had called in for help w/epistaxis... will route to pharmd pool to determine if refill can be granted or if they need to speak with pt

## 2019-10-21 NOTE — Telephone Encounter (Signed)
86 F 57.5 kg, SCr 1.46 LOV 5/20 [place]

## 2019-10-21 NOTE — Telephone Encounter (Signed)
She probably will need follow up with me soon given the recent nose bleed.

## 2019-10-27 ENCOUNTER — Telehealth: Payer: Self-pay | Admitting: Cardiology

## 2019-10-27 NOTE — Telephone Encounter (Signed)
Pt advised her husband may come to her 10/28/19 appt with her,... she states she is 67 and she is having increased memory loss and would feel better having him with her and helping her ambulate to the appt.   They both agree to wearing their masks and social distancing while here at the office.
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
## 2019-10-27 NOTE — Telephone Encounter (Signed)
Patient is requesting to have her husband come with her to her appt tomorrow with Dr. Antoine Poche. She states that she has a hard time remembering.

## 2019-10-28 ENCOUNTER — Other Ambulatory Visit: Payer: Self-pay

## 2019-10-28 ENCOUNTER — Encounter: Payer: Self-pay | Admitting: Cardiology

## 2019-10-28 ENCOUNTER — Ambulatory Visit: Payer: Medicare HMO | Admitting: Cardiology

## 2019-10-28 VITALS — BP 142/86 | HR 74 | Temp 97.0°F | Ht 60.0 in | Wt 125.0 lb

## 2019-10-28 DIAGNOSIS — R002 Palpitations: Secondary | ICD-10-CM | POA: Diagnosis not present

## 2019-10-28 DIAGNOSIS — Z79899 Other long term (current) drug therapy: Secondary | ICD-10-CM

## 2019-10-28 LAB — BASIC METABOLIC PANEL
BUN/Creatinine Ratio: 13 (ref 12–28)
BUN: 22 mg/dL (ref 8–27)
CO2: 22 mmol/L (ref 20–29)
Calcium: 10 mg/dL (ref 8.7–10.3)
Chloride: 105 mmol/L (ref 96–106)
Creatinine, Ser: 1.64 mg/dL — ABNORMAL HIGH (ref 0.57–1.00)
GFR calc Af Amer: 33 mL/min/{1.73_m2} — ABNORMAL LOW (ref >59)
GFR calc non Af Amer: 29 mL/min/{1.73_m2} — ABNORMAL LOW (ref >59)
Glucose: 91 mg/dL (ref 65–99)
Potassium: 4.5 mmol/L (ref 3.5–5.2)
Sodium: 141 mmol/L (ref 134–144)

## 2019-10-28 LAB — CBC
Hematocrit: 35.3 % (ref 34.0–46.6)
Hemoglobin: 11.2 g/dL (ref 11.1–15.9)
MCH: 26.6 pg (ref 26.6–33.0)
MCHC: 31.7 g/dL (ref 31.5–35.7)
MCV: 84 fL (ref 79–97)
Platelets: 213 10*3/uL (ref 150–450)
RBC: 4.21 x10E6/uL (ref 3.77–5.28)
RDW: 14.3 % (ref 11.7–15.4)
WBC: 11.4 10*3/uL — ABNORMAL HIGH (ref 3.4–10.8)

## 2019-10-28 LAB — TSH: TSH: 4.71 u[IU]/mL — ABNORMAL HIGH (ref 0.450–4.500)

## 2019-10-28 NOTE — Patient Instructions (Signed)
Medication Instructions:  NO CHANGES *If you need a refill on your cardiac medications before your next appointment, please call your pharmacy*  Lab Work: Your physician recommends that you return for lab work TODAY (BMP, TSH, CBC) If you have labs (blood work) drawn today and your tests are completely normal, you will receive your results only by: Marland Kitchen MyChart Message (if you have MyChart) OR . A paper copy in the mail If you have any lab test that is abnormal or we need to change your treatment, we will call you to review the results.  Testing/Procedures: NONE ORDERED THIS VISIT  Follow-Up: At [facility], you and your health needs are our priority.  As part of our continuing mission to provide you with exceptional heart care, we have created designated Provider Care Teams.  These Care Teams include your primary Cardiologist (physician) and Advanced Practice Providers (APPs -  Physician Assistants and Nurse Practitioners) who all work together to provide you with the care you need, when you need it.  Your next appointment:   6 month(s)  You will receive a reminder letter in the mail two months in advance. If you don't receive a letter, please call our office to schedule the follow-up appointment.  The format for your next appointment:   In Person  Provider:   [name], MD

## 2019-11-12 ENCOUNTER — Other Ambulatory Visit: Payer: Self-pay | Admitting: Cardiology

## 2019-11-14 ENCOUNTER — Other Ambulatory Visit: Payer: Self-pay

## 2019-11-14 MED ORDER — PANTOPRAZOLE SODIUM 40 MG PO TBEC: 40.0000 mg | DELAYED_RELEASE_TABLET | Freq: Every day | ORAL | 0 refills | Status: DC

## 2020-01-13 ENCOUNTER — Telehealth: Payer: Self-pay | Admitting: Cardiology

## 2020-01-13 NOTE — Telephone Encounter (Signed)
Pt c/o Shortness Of Breath: STAT if SOB developed within the last 24 hours or pt is noticeably SOB on the phone  1. Are you currently SOB (can you hear that pt is SOB on the phone)? no  2. How long have you been experiencing SOB? About a week  3. Are you SOB when sitting or when up moving around? walking  4. Are you currently experiencing any other symptoms? No  Patient states she saw her PCP about her SOB and was told to contact her cardiologist. She is scheduled 01/19/2020 with Joni Reining.

## 2020-01-13 NOTE — Telephone Encounter (Signed)
Spoke with pt who report she has been experiencing SOB for the past several weeks when walking. She also voiced some swelling in her right foot. Pt has an appointment scheduled with Joni Reining, DNP on 7/15. Nurse informed pt that office is currently closed but informed pt to report to ER if symptoms worsen over the weekend. Pt verbalized understanding.   Will route to MD for any further recommendation.

## 2020-01-14 NOTE — Telephone Encounter (Signed)
Agree 

## 2020-01-16 ENCOUNTER — Other Ambulatory Visit: Payer: Self-pay | Admitting: *Deleted

## 2020-01-16 DIAGNOSIS — I6523 Occlusion and stenosis of bilateral carotid arteries: Secondary | ICD-10-CM

## 2020-01-17 NOTE — Progress Notes (Signed)
Error Patient was in office instead of virtual.

## 2020-01-19 ENCOUNTER — Other Ambulatory Visit: Payer: Self-pay

## 2020-01-19 ENCOUNTER — Encounter: Payer: Self-pay | Admitting: Cardiology

## 2020-01-19 ENCOUNTER — Ambulatory Visit: Payer: Medicare HMO | Admitting: Cardiology

## 2020-01-19 VITALS — BP 201/79 | HR 71 | Ht 60.0 in | Wt 126.0 lb

## 2020-01-19 DIAGNOSIS — N1832 Chronic kidney disease, stage 3b: Secondary | ICD-10-CM

## 2020-01-19 DIAGNOSIS — Z9189 Other specified personal risk factors, not elsewhere classified: Secondary | ICD-10-CM | POA: Insufficient documentation

## 2020-01-19 DIAGNOSIS — I10 Essential (primary) hypertension: Secondary | ICD-10-CM

## 2020-01-19 DIAGNOSIS — Z7901 Long term (current) use of anticoagulants: Secondary | ICD-10-CM

## 2020-01-19 DIAGNOSIS — Z951 Presence of aortocoronary bypass graft: Secondary | ICD-10-CM

## 2020-01-19 DIAGNOSIS — Z952 Presence of prosthetic heart valve: Secondary | ICD-10-CM

## 2020-01-19 DIAGNOSIS — Z79899 Other long term (current) drug therapy: Secondary | ICD-10-CM | POA: Insufficient documentation

## 2020-01-19 DIAGNOSIS — Z953 Presence of xenogenic heart valve: Secondary | ICD-10-CM

## 2020-01-19 DIAGNOSIS — I77810 Thoracic aortic ectasia: Secondary | ICD-10-CM

## 2020-01-19 DIAGNOSIS — R0602 Shortness of breath: Secondary | ICD-10-CM

## 2020-01-19 DIAGNOSIS — I4819 Other persistent atrial fibrillation: Secondary | ICD-10-CM

## 2020-01-19 MED ORDER — CARVEDILOL 12.5 MG PO TABS
12.5000 mg | ORAL_TABLET | Freq: Two times a day (BID) | ORAL | 3 refills | Status: DC
Start: 2020-01-19 — End: 2020-12-24

## 2020-01-19 MED ORDER — HYDRALAZINE HCL 25 MG PO TABS
25.0000 mg | ORAL_TABLET | Freq: Two times a day (BID) | ORAL | 3 refills | Status: DC
Start: 2020-01-19 — End: 2020-12-24

## 2020-01-19 NOTE — Assessment & Plan Note (Signed)
20 mm Edwards Sapien 3 transcatheter heart valve placed via open right transfemoral approach for valve-in-valve replacement for prosthetic valve dysfunction.  Echo March 2021 showed no AS

## 2020-01-19 NOTE — Assessment & Plan Note (Signed)
Check labs today- hesitant to add ACE or diuretic for HTN

## 2020-01-19 NOTE — Assessment & Plan Note (Signed)
LIMA to LAD, SVG to RCA, sequential SVG to OM1-OM2 Patent grafts at cath sept 2017

## 2020-01-19 NOTE — Assessment & Plan Note (Signed)
Possibly secondary to uncontrolled HTN- no obvious CHF on exam

## 2020-01-19 NOTE — Assessment & Plan Note (Signed)
4.8 cm by echo March 2021

## 2020-01-19 NOTE — Patient Instructions (Addendum)
Medication Instructions:  STOP METOPROLOL  START HYDRALAZINE 25MG  TWICE DAILY-TAKE 2(50MG  ONE TIME) WHEN YOU GET HOME THE 25MG  TWICE DAILY.  START CARVEDILOL 12.5MG  TWICE DAILY(TAKE WHEN YOU GET HOME IF YOU HAVE NOT TAKEN YOUR METOPROLOL TODAY)  *If you need a refill on your cardiac medications before your next appointment, please call your pharmacy*  Lab Work: CBC, BMET AND BNP TODAY HERE IN OUR LAB If you have labs (blood work) drawn today and your tests are completely normal, you will receive your results only by:  MyChart Message (if you have MyChart) OR A paper copy in the mail.  If you have any lab test that is abnormal or we need to change your treatment, we will call you to review the results. You may go to any [facility] that is convenient for you however, we do have a lab in our office that is able to assist you. You DO NOT need an appointment for our lab. The lab is open 8:00am and closes at 4:00pm. Lunch 12:45 - 1:45pm.  Special Instructions PLEASE PURCHASE AND WEAR COMPRESSION STOCKINGS DAILY AND OFF AT BEDTIME. Compression stockings are elastic socks that squeeze the legs. They help to increase blood flow to the legs and to decrease swelling in the legs from fluid retention, and reduce the chance of developing blood clots in the lower legs. Please put on in the AM when dressing and off at night when dressing for bed. PLEASE MAKE SURE TO ELEVATE YOU LEGS WHILE SITTING, THIS WILL HELP WITH THE SWELLING ALSO.  PLEASE READ AND FOLLOW SALTY 6-ATTACHED  TAKE AND LOG YOU BLOOD PRESSURE DAILY AND BRING WITH YOU TO FOLLOW UP VISITS  Follow-Up: Your next appointment:  1-2 week(s)  Virtual Visit  with PA-C  At [facility], you and your health needs are our priority.  As part of our continuing mission to provide you with exceptional heart care, we have created designated Provider Care Teams.  These Care Teams include your primary Cardiologist (physician) and Advanced Practice  Providers (APPs -  Physician Assistants and Nurse Practitioners) who all work together to provide you with the care you need, when you need it.

## 2020-01-19 NOTE — Assessment & Plan Note (Signed)
On low dose Eliquis 
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
## 2020-01-19 NOTE — Assessment & Plan Note (Signed)
In NSR on Amiodarone

## 2020-01-19 NOTE — Assessment & Plan Note (Signed)
B/P uncontrolled.  Repeat B/P by me- 182/82. Change Lopressor 25 mg TID to Coreg 12.5 mg BID Add Hydralazine 25 mg BID- (take 50 mg this am). Virtual f/u in 1-2 weeks

## 2020-01-19 NOTE — Assessment & Plan Note (Signed)
#  83mm Sorin Mitroflow pericardial tissue valve

## 2020-01-20 ENCOUNTER — Telehealth: Payer: Self-pay | Admitting: Cardiology

## 2020-01-20 LAB — BASIC METABOLIC PANEL
BUN/Creatinine Ratio: 12 (ref 12–28)
BUN: 18 mg/dL (ref 8–27)
CO2: 19 mmol/L — ABNORMAL LOW (ref 20–29)
Calcium: 9.5 mg/dL (ref 8.7–10.3)
Chloride: 102 mmol/L (ref 96–106)
Creatinine, Ser: 1.48 mg/dL — ABNORMAL HIGH (ref 0.57–1.00)
GFR calc Af Amer: 37 mL/min/{1.73_m2} — ABNORMAL LOW (ref >59)
GFR calc non Af Amer: 32 mL/min/{1.73_m2} — ABNORMAL LOW (ref >59)
Glucose: 109 mg/dL — ABNORMAL HIGH (ref 65–99)
Potassium: 3.8 mmol/L (ref 3.5–5.2)
Sodium: 137 mmol/L (ref 134–144)

## 2020-01-20 LAB — CBC
Hematocrit: 28.5 % — ABNORMAL LOW (ref 34.0–46.6)
Hemoglobin: 8.2 g/dL — ABNORMAL LOW (ref 11.1–15.9)
MCH: 23.1 pg — ABNORMAL LOW (ref 26.6–33.0)
MCHC: 28.8 g/dL — ABNORMAL LOW (ref 31.5–35.7)
MCV: 80 fL (ref 79–97)
Platelets: 334 10*3/uL (ref 150–450)
RBC: 3.55 x10E6/uL — ABNORMAL LOW (ref 3.77–5.28)
RDW: 14.9 % (ref 11.7–15.4)
WBC: 12.7 10*3/uL — ABNORMAL HIGH (ref 3.4–10.8)

## 2020-01-20 LAB — BRAIN NATRIURETIC PEPTIDE: BNP: 894.4 pg/mL — ABNORMAL HIGH (ref 0.0–100.0)

## 2020-01-20 NOTE — Telephone Encounter (Signed)
I called and left message for the patient to call back to discuss her anemia finding on her labs. She has had this before- in fact the only recent Hgb was two months ago, all her previous Hgb have been 8-9.  When she calls back we should determine if she is having any bleeding issues.  I did suggest she contact her PCP about this as well.  Corine Shelter PA-C 01/20/2020 12:25 PM

## 2020-01-20 NOTE — Telephone Encounter (Signed)
Follow Up  Patient is returning call. States that she called her PCP, but he is on a mission trip and would like to get assistance with her symptoms. Please give patient a call back to assist.

## 2020-01-20 NOTE — Telephone Encounter (Signed)
Spoke to patient lab results given.Stated she had a nose bleed about 1 month ago.She had to go to ED to have nose packed.Stated she has not noticed any bleeding in stool and urine.Advised to see PCP.Lab sent to PCP [facility].

## 2020-01-20 NOTE — Telephone Encounter (Signed)
Spoke with patient and she stated her PCP was on mission trip, covering could not see today and will be out next week. Advised to call and get appointment as soon as return. Again patient denies and visible bleeding or dark stools. Will forward to [place] K PA to make aware

## 2020-01-20 NOTE — Telephone Encounter (Signed)
Follow up   Pt is returning call    

## 2020-01-23 ENCOUNTER — Ambulatory Visit: Payer: Medicare HMO | Admitting: Physician Assistant

## 2020-01-23 ENCOUNTER — Other Ambulatory Visit: Payer: Self-pay

## 2020-01-23 ENCOUNTER — Ambulatory Visit (HOSPITAL_COMMUNITY)
Admission: RE | Admit: 2020-01-23 | Discharge: 2020-01-23 | Disposition: A | Discharge status: Home / Self Care | Payer: Medicare HMO | Source: Ambulatory Visit | Attending: Surgery | Admitting: Surgery

## 2020-01-23 VITALS — BP 114/57 | HR 46 | Temp 97.3°F | Resp 20 | Ht 60.0 in | Wt 120.0 lb

## 2020-01-23 DIAGNOSIS — I6523 Occlusion and stenosis of bilateral carotid arteries: Secondary | ICD-10-CM

## 2020-01-23 NOTE — Telephone Encounter (Signed)
Please have her get a repeat CBC today or tomorrow  Corine Shelter PA-C 01/23/2020 7:47 AM

## 2020-01-23 NOTE — Telephone Encounter (Signed)
Spoke to patient, she states she is unable to get labs today or tomorrow.  She states she will call her PCP to see if they can do this.  Advised to call back if not.

## 2020-01-23 NOTE — Progress Notes (Signed)
Foll
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
## 2020-01-25 ENCOUNTER — Emergency Department (HOSPITAL_COMMUNITY): Payer: Medicare HMO

## 2020-01-25 ENCOUNTER — Telehealth: Payer: Self-pay | Admitting: Cardiology

## 2020-01-25 ENCOUNTER — Emergency Department (HOSPITAL_COMMUNITY)
Admission: EM | Admit: 2020-01-25 | Discharge: 2020-01-25 | Disposition: A | Discharge status: Home / Self Care | Payer: Medicare HMO | Attending: Emergency Medicine | Admitting: Emergency Medicine

## 2020-01-25 ENCOUNTER — Encounter (HOSPITAL_COMMUNITY): Payer: Self-pay | Admitting: Emergency Medicine

## 2020-01-25 ENCOUNTER — Telehealth: Payer: Self-pay

## 2020-01-25 DIAGNOSIS — R079 Chest pain, unspecified: Secondary | ICD-10-CM | POA: Diagnosis present

## 2020-01-25 DIAGNOSIS — Z5321 Procedure and treatment not carried out due to patient leaving prior to being seen by health care provider: Secondary | ICD-10-CM | POA: Diagnosis not present

## 2020-01-25 LAB — BASIC METABOLIC PANEL
Anion gap: 10 (ref 5–15)
BUN: 28 mg/dL — ABNORMAL HIGH (ref 8–23)
CO2: 20 mmol/L — ABNORMAL LOW (ref 22–32)
Calcium: 8.8 mg/dL — ABNORMAL LOW (ref 8.9–10.3)
Chloride: 111 mmol/L (ref 98–111)
Creatinine, Ser: 1.65 mg/dL — ABNORMAL HIGH (ref 0.44–1.00)
GFR calc Af Amer: 32 mL/min — ABNORMAL LOW (ref >60)
GFR calc non Af Amer: 28 mL/min — ABNORMAL LOW (ref >60)
Glucose, Bld: 145 mg/dL — ABNORMAL HIGH (ref 70–99)
Potassium: 3.9 mmol/L (ref 3.5–5.1)
Sodium: 141 mmol/L (ref 135–145)

## 2020-01-25 LAB — CBC
HCT: 26.1 % — ABNORMAL LOW (ref 36.0–46.0)
Hemoglobin: 7.2 g/dL — ABNORMAL LOW (ref 12.0–15.0)
MCH: 23 pg — ABNORMAL LOW (ref 26.0–34.0)
MCHC: 27.6 g/dL — ABNORMAL LOW (ref 30.0–36.0)
MCV: 83.4 fL (ref 80.0–100.0)
Platelets: 337 10*3/uL (ref 150–400)
RBC: 3.13 MIL/uL — ABNORMAL LOW (ref 3.87–5.11)
RDW: 16.3 % — ABNORMAL HIGH (ref 11.5–15.5)
WBC: 12.8 10*3/uL — ABNORMAL HIGH (ref 4.0–10.5)
nRBC: 0 % (ref 0.0–0.2)

## 2020-01-25 LAB — TROPONIN I (HIGH SENSITIVITY): Troponin I (High Sensitivity): 13 ng/L (ref <18)

## 2020-01-25 MED ORDER — SODIUM CHLORIDE 0.9% FLUSH: 3.0000 mL | Freq: Once | INTRAVENOUS | Status: DC

## 2020-01-25 NOTE — Telephone Encounter (Signed)
Received a call from patient.She stated she had sob last night hard to sleep.She continues to have sob this morning hard to walk from room to room. Weight stable.Hgb on 7/15- 8.2.She was unable to get appointment with PCP.[facility] in [place] called appointment scheduled with Dr.Turner 7/22 at 1:30 pm.Advised since sob is worse today she needs to go to ED.Stated her husband just left to get oil changed in car,she will go when he gets back home.

## 2020-01-25 NOTE — ED Triage Notes (Signed)
Pt here from home with chest pain times 8 days , non radiating  , no n/v or sob , pain is worse when walking 324mg  asa 1 inch nitropaste  placed by ems

## 2020-01-25 NOTE — Telephone Encounter (Signed)
Pt c/o Shortness Of Breath: STAT if SOB developed within the last 24 hours or pt is noticeably SOB on the phone  1. Are you currently SOB (can you hear that pt is SOB on the phone)? Yes   2. How long have you been experiencing SOB? Started yesterday  3. Are you SOB when sitting or when up moving around? Both   4. Are you currently experiencing any other symptoms? No    States she feels like she might be full of fluids.

## 2020-01-25 NOTE — ED Notes (Addendum)
Pt stated her decision to leave [facility] ED due to the expected wait time for seeing a provider. Pt was encouraged to stay and see a provider, and she was reminded that her wait time would restart if she chose to leave [facility] ED and later return. Pt acknowledged this and requested assistance in leaving the lobby.

## 2020-01-25 NOTE — Telephone Encounter (Signed)
Called patient left message on personal voice mail I received a message from [facility] PA he reviewed your recent lab BNP elevated he has prescribed Lasix 40 mg daily.Advised to call me back and let me know what pharmacy to send prescription to.

## 2020-01-26 ENCOUNTER — Telehealth: Payer: Self-pay | Admitting: Cardiology

## 2020-01-26 ENCOUNTER — Encounter: Payer: Self-pay | Admitting: Cardiology

## 2020-01-26 MED ORDER — FUROSEMIDE 40 MG PO TABS
40.0000 mg | ORAL_TABLET | Freq: Every day | ORAL | 3 refills | Status: DC
Start: 2020-01-26 — End: 2021-12-11

## 2020-01-26 NOTE — Telephone Encounter (Signed)
Patient went to ED 7/21-CBC completed.  Patient LWBS

## 2020-01-26 NOTE — Telephone Encounter (Signed)
Dr Antoine Poche I wanted to update you on your patient [name]-  The patient was instructed to go to the ED yesterday after her Hgb came back 7.2.  She went to the ED but left after 6 hours as she wasn't seen.  I reviewed her lab work and CXR.  I suspect she has CHF as well as significant acute on chronic anemia.  I called her today and again suggested that the best thing would be for her to be hospitalized to evaluate her further.  She declined.   She has seen a nephrologist in the past in [facility]. I explained that she is at stage 4 CRI and asked if anyone has mentioned the possibility of dialysis to her.  Her initial reaction was that she would refuse. She does have an appointment with her PCP today.  I did call in a couple doses of Lasix for her but as sated above I think hospitalization would be the best way to handle her.  Corine Shelter PA-C 01/26/2020 9:58 AM

## 2020-01-26 NOTE — Telephone Encounter (Signed)
Left message to call back  

## 2020-01-26 NOTE — Telephone Encounter (Signed)
Error

## 2020-01-26 NOTE — Telephone Encounter (Signed)
[name] is returning [name]'s phone call, but hung up before I was able to ask which pharmacy she would like the medication to go to. Please advise.

## 2020-01-26 NOTE — Addendum Note (Signed)
Addended by: Parke Poisson on: 01/26/2020 08:56 AM   Modules accepted: Orders

## 2020-01-26 NOTE — Telephone Encounter (Signed)
[name] the patient's daughter is calling back stating the first available for a new patient appointment at the kidney Doctor wouldn't be until 03/27/20. She would like to know if there is another office that could be suggested to see if a sooner appointment is available somewhere else. Please advise.

## 2020-01-26 NOTE — Telephone Encounter (Signed)
Patient states she was in the ED yesterday, had a CXR and was never called back so she left. She is calling the office today to get those CXR results. Please call.

## 2020-01-26 NOTE — Telephone Encounter (Signed)
Nurse was able to confirm pharmacy to send lasix 40 mg daily as requested by Corine Shelter, PA. Pt also voiced she was seen in the ER yesterday and had a chest xray but left before she could receive report due to long wait. She is inquiring if [facility] or Dr. [place] [facility] could provide her with report information and recommendations.  Will route to message.

## 2020-01-26 NOTE — Telephone Encounter (Signed)
Please see previous encounter

## 2020-01-27 NOTE — Telephone Encounter (Signed)
I called the patient this morning.  Her primary care provider got her to go to [facility] where she is now admitted.  Corine Shelter PA-C 01/27/2020 9:10 AM

## 2020-01-31 ENCOUNTER — Telehealth: Payer: Self-pay | Admitting: Cardiology

## 2020-01-31 NOTE — Telephone Encounter (Signed)
Called patient back about [name] recommendations. Patient verbalized understanding.

## 2020-01-31 NOTE — Telephone Encounter (Signed)
Patient stated she is not taking the furosemide and the Hydralazine due to her BP being low. Today BP 117/47 HR. Informed patient that her BP was fine and we would let the provider know. Patient stated she thinks her BP was elevated due to pain, and now she does not have the pain, so her BP is back down. Will forward to [facility] PA.

## 2020-01-31 NOTE — Telephone Encounter (Signed)
Pt c/o BP issue: STAT if pt c/o blurred vision, one-sided weakness or slurred speech  1. What are your last 5 BP readings? 117/47  2. Are you having any other symptoms (ex. Dizziness, headache, blurred vision, passed out)? no  3. What is your BP issue? Pt is on new medication and wanted to know if that is a normal BP reading for her. Please advise

## 2020-01-31 NOTE — Telephone Encounter (Signed)
OK for her to hold Hydralazine for systolic B/P less than 120.  Keep f/u 8/5 as scheduled.  Corine Shelter PA-C 01/31/2020 10:43 AM

## 2020-02-03 ENCOUNTER — Other Ambulatory Visit: Payer: Self-pay | Admitting: Cardiology

## 2020-02-09 ENCOUNTER — Ambulatory Visit: Payer: Medicare HMO | Admitting: Cardiology

## 2020-02-09 ENCOUNTER — Encounter: Payer: Self-pay | Admitting: Cardiology

## 2020-02-09 DIAGNOSIS — I5043 Acute on chronic combined systolic (congestive) and diastolic (congestive) heart failure: Secondary | ICD-10-CM | POA: Diagnosis not present

## 2020-02-09 NOTE — Patient Instructions (Signed)
Medication Instructions:  Your physician recommends that you continue on your current medications as directed. Please refer to the Current Medication list given to you today.  Eliquis Patient Assistance form Investment banker, operational) given today. Please bring this back when you have completed it and we will fax it.  *If you need a refill on your cardiac medications before your next appointment, please call your pharmacy*    Follow-Up: At [facility], you and your health needs are our priority.  As part of our continuing mission to provide you with exceptional heart care, we have created designated Provider Care Teams.  These Care Teams include your primary Cardiologist (physician) and Advanced Practice Providers (APPs -  Physician Assistants and Nurse Practitioners) who all work together to provide you with the care you need, when you need it.  We recommend signing up for the patient portal called "MyChart".  Sign up information is provided on this After Visit Summary.  MyChart is used to connect with patients for Virtual Visits (Telemedicine).  Patients are able to view lab/test results, encounter notes, upcoming appointments, etc.  Non-urgent messages can be sent to your provider as well.   To learn more about what you can do with MyChart, go to [email or web address].    Your next appointment:   6 month(s)  The format for your next appointment:   In Person  Provider:   You may see [name], MD or one of the following Advanced Practice Providers on your designated Care Team:    Theodore Demark, PA-C  Joni Reining, DNP, ANP  Cadence Fransico Michael, PA-C    Other Instructions Please call our office 2 months in advance o schedule your follow-up appointment with Dr. Antoine Poche.
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
## 2020-02-16 ENCOUNTER — Encounter: Payer: Self-pay | Admitting: Cardiology

## 2020-02-16 NOTE — Progress Notes (Signed)
I reviewed the patient's discharge summary from [facility] where she was admitted 7/22 to 01/28/2020.  She was treated for acute on chronic heart failure as well as community-acquired pneumonia.  She was given IV Lasix and IV Levaquin.  She did have a hemoglobin of 8.0, they note that her Hemoccult was positive but there was no frank blood in her stool.  Her symptoms were improved after 800cc diuresis and she was discharged on p.o. lasix 20 mg BID and antibiotics with instructions to follow-up her anemia with her PCP.  Her medications were not changed.   Corine Shelter PA-C 02/16/2020 4:01 PM

## 2020-03-05 DIAGNOSIS — H44521 Atrophy of globe, right eye: Secondary | ICD-10-CM | POA: Insufficient documentation

## 2020-05-03 DIAGNOSIS — J471 Bronchiectasis with (acute) exacerbation: Secondary | ICD-10-CM | POA: Insufficient documentation

## 2020-05-06 ENCOUNTER — Other Ambulatory Visit: Payer: Self-pay | Admitting: Cardiology

## 2020-06-05 DIAGNOSIS — D500 Iron deficiency anemia secondary to blood loss (chronic): Secondary | ICD-10-CM | POA: Insufficient documentation

## 2020-07-10 ENCOUNTER — Other Ambulatory Visit: Payer: Self-pay | Admitting: Internal Medicine

## 2020-07-10 ENCOUNTER — Telehealth: Payer: Self-pay | Admitting: Cardiology

## 2020-07-10 NOTE — Telephone Encounter (Signed)
Patient called, confused because pharmacy told her Dr.Hochrein name not on her most recent prescription.  She thinks it's a [employer] provider.  I assured her that it would be fine and she should continue with medications.

## 2020-07-25 ENCOUNTER — Other Ambulatory Visit: Payer: Self-pay | Admitting: Cardiology

## 2020-08-26 DIAGNOSIS — I5032 Chronic diastolic (congestive) heart failure: Secondary | ICD-10-CM | POA: Insufficient documentation
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
## 2020-08-27 ENCOUNTER — Encounter: Payer: Self-pay | Admitting: Cardiology

## 2020-08-27 ENCOUNTER — Ambulatory Visit: Payer: Medicare HMO | Admitting: Cardiology

## 2020-08-27 ENCOUNTER — Other Ambulatory Visit: Payer: Self-pay

## 2020-08-27 VITALS — BP 124/64 | HR 53 | Ht 61.0 in | Wt 128.0 lb

## 2020-08-27 DIAGNOSIS — I2510 Atherosclerotic heart disease of native coronary artery without angina pectoris: Secondary | ICD-10-CM | POA: Diagnosis not present

## 2020-08-27 DIAGNOSIS — I5032 Chronic diastolic (congestive) heart failure: Secondary | ICD-10-CM

## 2020-08-27 DIAGNOSIS — I77810 Thoracic aortic ectasia: Secondary | ICD-10-CM

## 2020-08-27 DIAGNOSIS — Z952 Presence of prosthetic heart valve: Secondary | ICD-10-CM

## 2020-08-27 DIAGNOSIS — I10 Essential (primary) hypertension: Secondary | ICD-10-CM | POA: Diagnosis not present

## 2020-08-27 DIAGNOSIS — I4819 Other persistent atrial fibrillation: Secondary | ICD-10-CM

## 2020-08-27 NOTE — Patient Instructions (Signed)
Medication Instructions:  Your physician recommends that you continue on your current medications as directed. Please refer to the Current Medication list given to you today.  *If you need a refill on your cardiac medications before your next appointment, please call your pharmacy*   Lab Work: When you have labs at your primary care doctor, please make sure they check :TSH (thyroid) and hepatic (liver function)  Testing/Procedures: Your physician has requested that you have an echocardiogram in March. Echocardiography is a painless test that uses sound waves to create images of your heart. It provides your doctor with information about the size and shape of your heart and how well your heart's chambers and valves are working. This procedure takes approximately one hour. There are no restrictions for this procedure. This will be done at our [facility] location:  [address]  Follow-Up: At [facility], you and your health needs are our priority.  As part of our continuing mission to provide you with exceptional heart care, we have created designated Provider Care Teams.  These Care Teams include your primary Cardiologist (physician) and Advanced Practice Providers (APPs -  Physician Assistants and Nurse Practitioners) who all work together to provide you with the care you need, when you need it.  We recommend signing up for the patient portal called "MyChart".  Sign up information is provided on this After Visit Summary.  MyChart is used to connect with patients for Virtual Visits (Telemedicine).  Patients are able to view lab/test results, encounter notes, upcoming appointments, etc.  Non-urgent messages can be sent to your provider as well.   To learn more about what you can do with MyChart, go to [email or web address].    Your next appointment:   12 month(s)  The format for your next appointment:   In Person  Provider:   [name], MD

## 2020-09-19 ENCOUNTER — Other Ambulatory Visit: Payer: Self-pay

## 2020-09-19 ENCOUNTER — Ambulatory Visit (HOSPITAL_COMMUNITY): Payer: Medicare HMO | Attending: Cardiology

## 2020-09-19 DIAGNOSIS — Z952 Presence of prosthetic heart valve: Secondary | ICD-10-CM | POA: Insufficient documentation

## 2020-09-19 DIAGNOSIS — I5032 Chronic diastolic (congestive) heart failure: Secondary | ICD-10-CM | POA: Insufficient documentation

## 2020-09-19 LAB — ECHOCARDIOGRAM COMPLETE
AR max vel: 1.02 cm2
AV Area VTI: 1.08 cm2
AV Area mean vel: 1.01 cm2
AV Mean grad: 19.3 mmHg
AV Peak grad: 38 mmHg
Ao pk vel: 3.08 m/s
Area-P 1/2: 3.51 cm2
MV M vel: 4.41 m/s
MV Peak grad: 77.8 mmHg
MV VTI: 1.73 cm2
Radius: 0.6 cm
S' Lateral: 2.8 cm

## 2020-09-21 DIAGNOSIS — S81811D Laceration without foreign body, right lower leg, subsequent encounter: Secondary | ICD-10-CM | POA: Insufficient documentation

## 2020-09-21 DIAGNOSIS — T148XXA Other injury of unspecified body region, initial encounter: Secondary | ICD-10-CM | POA: Insufficient documentation

## 2020-09-27 ENCOUNTER — Telehealth: Payer: Self-pay | Admitting: Cardiology

## 2020-09-27 NOTE — Telephone Encounter (Signed)
    Pt c/o of Chest Pain: STAT if CP now or developed within 24 hours  1. Are you having CP right now? A little bit  2. Are you experiencing any other symptoms (ex. SOB, nausea, vomiting, sweating)? SOB  3. How long have you been experiencing CP? Last night  4. Is your CP continuous or coming and going? continuous   5. Have you taken Nitroglycerin? No  Pt and her daughter Dondra Spry calling, said pt started feeling pain around her chest and still have lingering pain today, they said pt might have fluid in her lungs as well.  ?

## 2020-09-27 NOTE — Telephone Encounter (Signed)
She needs to take her Lasix as she is supposed to.  Difficult to manage her SOB without this.  If she gets more SOB she will need to go to the ED.

## 2020-09-27 NOTE — Telephone Encounter (Signed)
Called pt back to relay Dr. Jenene Slicker instructions. Pt is to take her lasix as directed to help manage her SOB. Explained to pt to start taking lasix daily and come to appointment with Dr. Antoine Poche on 10/04/20. Advised pt that if her shortness of breath gets worst that she will need to go to the ED. All questions answered and pt verbalizes understanding.

## 2020-09-27 NOTE — Telephone Encounter (Signed)
Spoke with [name] and her daughter [name] on speaker phone together. Pt states that she woke up short of breath last night and feels pain around her heart. On a 0-10 pain scale, pt states that her pain is a 5, but that this pain is similar to when she has had fluid on her lungs in the past. Pt's b/p this morning is 174/73 with heart rate of 66bpm. Pt does not weigh herself daily. Pt states after she woke up short of breath she had a hard time sleeping last night. Pt did not attempt to sleep with more than one pillow. Asked if pt is taking all medications as directed. Daughter states that pt does not take her "fluid pill" (lasix) as directed.  Daughter states that pt is taking maybe once a week because she visits with her husband in the nursing home and doesn't want to be troubled with having to go to the bathroom frequently. Explained to pt and daughter how important it is to take all medications as directed by physician.
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
## 2020-10-04 ENCOUNTER — Ambulatory Visit: Payer: Medicare HMO | Admitting: Cardiology

## 2020-10-04 ENCOUNTER — Encounter: Payer: Self-pay | Admitting: Cardiology

## 2020-10-04 ENCOUNTER — Other Ambulatory Visit: Payer: Self-pay

## 2020-10-04 VITALS — BP 100/62 | HR 54 | Ht 61.0 in | Wt 119.0 lb

## 2020-10-04 DIAGNOSIS — I4819 Other persistent atrial fibrillation: Secondary | ICD-10-CM | POA: Diagnosis not present

## 2020-10-04 DIAGNOSIS — Z953 Presence of xenogenic heart valve: Secondary | ICD-10-CM

## 2020-10-04 DIAGNOSIS — R5383 Other fatigue: Secondary | ICD-10-CM

## 2020-10-04 DIAGNOSIS — Z79899 Other long term (current) drug therapy: Secondary | ICD-10-CM

## 2020-10-04 DIAGNOSIS — N1831 Chronic kidney disease, stage 3a: Secondary | ICD-10-CM | POA: Diagnosis not present

## 2020-10-04 DIAGNOSIS — I77810 Thoracic aortic ectasia: Secondary | ICD-10-CM

## 2020-10-04 LAB — COMPREHENSIVE METABOLIC PANEL
ALT: 21 IU/L (ref 0–32)
AST: 21 IU/L (ref 0–40)
Albumin/Globulin Ratio: 1.7 (ref 1.2–2.2)
Albumin: 3.8 g/dL (ref 3.6–4.6)
Alkaline Phosphatase: 78 IU/L (ref 44–121)
BUN/Creatinine Ratio: 20 (ref 12–28)
BUN: 34 mg/dL — ABNORMAL HIGH (ref 8–27)
Bilirubin Total: 0.8 mg/dL (ref 0.0–1.2)
CO2: 18 mmol/L — ABNORMAL LOW (ref 20–29)
Calcium: 9 mg/dL (ref 8.7–10.3)
Chloride: 103 mmol/L (ref 96–106)
Creatinine, Ser: 1.72 mg/dL — ABNORMAL HIGH (ref 0.57–1.00)
Globulin, Total: 2.3 g/dL (ref 1.5–4.5)
Glucose: 93 mg/dL (ref 65–99)
Potassium: 4.5 mmol/L (ref 3.5–5.2)
Sodium: 136 mmol/L (ref 134–144)
Total Protein: 6.1 g/dL (ref 6.0–8.5)
eGFR: 29 mL/min/{1.73_m2} — ABNORMAL LOW (ref >59)

## 2020-10-04 LAB — CBC
Hematocrit: 25.5 % — ABNORMAL LOW (ref 34.0–46.6)
Hemoglobin: 8.2 g/dL — ABNORMAL LOW (ref 11.1–15.9)
MCH: 28.3 pg (ref 26.6–33.0)
MCHC: 32.2 g/dL (ref 31.5–35.7)
MCV: 88 fL (ref 79–97)
Platelets: 209 10*3/uL (ref 150–450)
RBC: 2.9 x10E6/uL — ABNORMAL LOW (ref 3.77–5.28)
RDW: 13.4 % (ref 11.7–15.4)
WBC: 10.3 10*3/uL (ref 3.4–10.8)

## 2020-10-04 LAB — TSH: TSH: 7.6 u[IU]/mL — ABNORMAL HIGH (ref 0.450–4.500)

## 2020-10-04 NOTE — Patient Instructions (Signed)
Medication Instructions:  Continue current medications  *If you need a refill on your cardiac medications before your next appointment, please call your pharmacy*   Lab Work: CBC, CMP and TSH  If you have labs (blood work) drawn today and your tests are completely normal, you will receive your results only by: Marland Kitchen MyChart Message (if you have MyChart) OR . A paper copy in the mail If you have any lab test that is abnormal or we need to change your treatment, we will call you to review the results.   Testing/Procedures: None Ordered   Follow-Up: At [facility], you and your health needs are our priority.  As part of our continuing mission to provide you with exceptional heart care, we have created designated Provider Care Teams.  These Care Teams include your primary Cardiologist (physician) and Advanced Practice Providers (APPs -  Physician Assistants and Nurse Practitioners) who all work together to provide you with the care you need, when you need it.  We recommend signing up for the patient portal called "MyChart".  Sign up information is provided on this After Visit Summary.  MyChart is used to connect with patients for Virtual Visits (Telemedicine).  Patients are able to view lab/test results, encounter notes, upcoming appointments, etc.  Non-urgent messages can be sent to your provider as well.   To learn more about what you can do with MyChart, go to [email or web address].    Your next appointment:   3 month(s)  The format for your next appointment:   In Person  Provider:   With an APP

## 2020-10-05 ENCOUNTER — Other Ambulatory Visit: Payer: Self-pay | Admitting: *Deleted

## 2020-10-05 DIAGNOSIS — N1831 Chronic kidney disease, stage 3a: Secondary | ICD-10-CM

## 2020-10-05 DIAGNOSIS — I10 Essential (primary) hypertension: Secondary | ICD-10-CM

## 2020-10-05 DIAGNOSIS — R7989 Other specified abnormal findings of blood chemistry: Secondary | ICD-10-CM

## 2020-10-07 ENCOUNTER — Other Ambulatory Visit: Payer: Self-pay | Admitting: Cardiology

## 2020-10-08 ENCOUNTER — Other Ambulatory Visit: Payer: Self-pay | Admitting: *Deleted

## 2020-10-08 DIAGNOSIS — N1831 Chronic kidney disease, stage 3a: Secondary | ICD-10-CM

## 2020-10-08 DIAGNOSIS — I10 Essential (primary) hypertension: Secondary | ICD-10-CM

## 2020-10-08 DIAGNOSIS — R7989 Other specified abnormal findings of blood chemistry: Secondary | ICD-10-CM

## 2020-10-09 LAB — BASIC METABOLIC PANEL
BUN/Creatinine Ratio: 18 (ref 12–28)
BUN: 38 mg/dL — ABNORMAL HIGH (ref 8–27)
CO2: 17 mmol/L — ABNORMAL LOW (ref 20–29)
Calcium: 9.4 mg/dL (ref 8.7–10.3)
Chloride: 101 mmol/L (ref 96–106)
Creatinine, Ser: 2.17 mg/dL — ABNORMAL HIGH (ref 0.57–1.00)
Glucose: 101 mg/dL — ABNORMAL HIGH (ref 65–99)
Potassium: 4.2 mmol/L (ref 3.5–5.2)
Sodium: 135 mmol/L (ref 134–144)
eGFR: 22 mL/min/{1.73_m2} — ABNORMAL LOW (ref >59)

## 2020-10-09 LAB — T4, FREE: Free T4: 1.64 ng/dL (ref 0.82–1.77)

## 2020-10-09 LAB — T3, FREE: T3, Free: 1.5 pg/mL — ABNORMAL LOW (ref 2.0–4.4)

## 2020-11-03 ENCOUNTER — Other Ambulatory Visit: Payer: Self-pay | Admitting: Cardiology
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
## 2020-12-24 ENCOUNTER — Encounter: Payer: Self-pay | Admitting: Cardiology

## 2020-12-24 ENCOUNTER — Other Ambulatory Visit: Payer: Self-pay

## 2020-12-24 ENCOUNTER — Ambulatory Visit: Payer: Medicare HMO | Admitting: Cardiology

## 2020-12-24 VITALS — BP 171/65 | HR 64 | Ht 61.0 in | Wt 106.8 lb

## 2020-12-24 DIAGNOSIS — I10 Essential (primary) hypertension: Secondary | ICD-10-CM | POA: Diagnosis not present

## 2020-12-24 DIAGNOSIS — I4819 Other persistent atrial fibrillation: Secondary | ICD-10-CM | POA: Diagnosis not present

## 2020-12-24 DIAGNOSIS — I5032 Chronic diastolic (congestive) heart failure: Secondary | ICD-10-CM | POA: Diagnosis not present

## 2020-12-24 DIAGNOSIS — Z952 Presence of prosthetic heart valve: Secondary | ICD-10-CM | POA: Diagnosis not present

## 2020-12-24 MED ORDER — HYDRALAZINE HCL 25 MG PO TABS: 12.5000 mg | ORAL_TABLET | Freq: Two times a day (BID) | ORAL | 3 refills | Status: DC

## 2020-12-24 NOTE — Patient Instructions (Signed)
Medication Instructions:  Take Hydralazine 25 mg (take 0.5 tablet twice daily)   *If you need a refill on your cardiac medications before your next appointment, please call your pharmacy*   Follow-Up: At [facility], you and your health needs are our priority.  As part of our continuing mission to provide you with exceptional heart care, we have created designated Provider Care Teams.  These Care Teams include your primary Cardiologist (physician) and Advanced Practice Providers (APPs -  Physician Assistants and Nurse Practitioners) who all work together to provide you with the care you need, when you need it.  We recommend signing up for the patient portal called "MyChart".  Sign up information is provided on this After Visit Summary.  MyChart is used to connect with patients for Virtual Visits (Telemedicine).  Patients are able to view lab/test results, encounter notes, upcoming appointments, etc.  Non-urgent messages can be sent to your provider as well.   To learn more about what you can do with MyChart, go to [email or web address].    Your next appointment:   2 month(s)  The format for your next appointment:   In Person  Provider:   You will see one of the following Advanced Practice Providers on your designated Care Team:   Theodore Demark, PA-C Joni Reining, DNP, ANP

## 2021-01-11 DIAGNOSIS — R109 Unspecified abdominal pain: Secondary | ICD-10-CM | POA: Insufficient documentation

## 2021-02-14 DIAGNOSIS — R0982 Postnasal drip: Secondary | ICD-10-CM | POA: Insufficient documentation

## 2021-02-14 DIAGNOSIS — J300 Vasomotor rhinitis: Secondary | ICD-10-CM | POA: Insufficient documentation

## 2021-02-14 DIAGNOSIS — R059 Cough, unspecified: Secondary | ICD-10-CM | POA: Insufficient documentation

## 2021-02-21 ENCOUNTER — Encounter: Payer: Self-pay | Admitting: Podiatry

## 2021-02-21 ENCOUNTER — Other Ambulatory Visit: Payer: Self-pay

## 2021-02-21 ENCOUNTER — Ambulatory Visit: Payer: Medicare HMO | Admitting: Podiatry

## 2021-02-21 DIAGNOSIS — B351 Tinea unguium: Secondary | ICD-10-CM

## 2021-02-21 DIAGNOSIS — M79675 Pain in left toe(s): Secondary | ICD-10-CM

## 2021-02-21 DIAGNOSIS — M79674 Pain in right toe(s): Secondary | ICD-10-CM | POA: Diagnosis not present

## 2021-02-21 DIAGNOSIS — R600 Localized edema: Secondary | ICD-10-CM | POA: Diagnosis not present
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
## 2021-03-04 ENCOUNTER — Other Ambulatory Visit: Payer: Self-pay

## 2021-03-04 ENCOUNTER — Encounter: Payer: Self-pay | Admitting: Cardiology

## 2021-03-04 ENCOUNTER — Ambulatory Visit: Payer: Medicare HMO | Admitting: Cardiology

## 2021-03-04 VITALS — BP 128/70 | HR 68 | Ht 61.0 in | Wt 106.0 lb

## 2021-03-04 DIAGNOSIS — Z952 Presence of prosthetic heart valve: Secondary | ICD-10-CM | POA: Diagnosis not present

## 2021-03-04 DIAGNOSIS — I5032 Chronic diastolic (congestive) heart failure: Secondary | ICD-10-CM

## 2021-03-04 DIAGNOSIS — I2510 Atherosclerotic heart disease of native coronary artery without angina pectoris: Secondary | ICD-10-CM | POA: Diagnosis not present

## 2021-03-04 DIAGNOSIS — I10 Essential (primary) hypertension: Secondary | ICD-10-CM | POA: Diagnosis not present

## 2021-03-04 DIAGNOSIS — I4819 Other persistent atrial fibrillation: Secondary | ICD-10-CM

## 2021-03-04 DIAGNOSIS — I77810 Thoracic aortic ectasia: Secondary | ICD-10-CM

## 2021-03-04 NOTE — Patient Instructions (Signed)
Medication Instructions:  No changes  *If you need a refill on your cardiac medications before your next appointment, please call your pharmacy*   Lab Work:  Not needed   Testing/Procedures:  Not needed  Follow-Up: At [facility], you and your health needs are our priority.  As part of our continuing mission to provide you with exceptional heart care, we have created designated Provider Care Teams.  These Care Teams include your primary Cardiologist (physician) and Advanced Practice Providers (APPs -  Physician Assistants and Nurse Practitioners) who all work together to provide you with the care you need, when you need it.     Your next appointment:   6 month(s)  The format for your next appointment:   In Person  Provider:   You will see one of the following Advanced Practice Providers on your designated Care Team:   Theodore Demark, PA-C Juanda Crumble, PA-C Joni Reining, DNP, ANP

## 2021-03-28 ENCOUNTER — Other Ambulatory Visit: Payer: Self-pay

## 2021-03-28 DIAGNOSIS — I6523 Occlusion and stenosis of bilateral carotid arteries: Secondary | ICD-10-CM

## 2021-05-02 ENCOUNTER — Encounter (HOSPITAL_COMMUNITY): Payer: Medicare HMO

## 2021-05-02 ENCOUNTER — Ambulatory Visit: Payer: Medicare HMO

## 2021-05-12 DIAGNOSIS — E871 Hypo-osmolality and hyponatremia: Secondary | ICD-10-CM | POA: Insufficient documentation

## 2021-05-16 ENCOUNTER — Encounter (HOSPITAL_COMMUNITY): Payer: Medicare HMO

## 2021-05-16 ENCOUNTER — Ambulatory Visit: Payer: Medicare HMO

## 2021-05-23 ENCOUNTER — Ambulatory Visit (HOSPITAL_COMMUNITY)
Admission: RE | Admit: 2021-05-23 | Discharge: 2021-05-23 | Disposition: A | Discharge status: Home / Self Care | Payer: Medicare HMO | Source: Ambulatory Visit | Attending: Vascular Surgery | Admitting: Vascular Surgery

## 2021-05-23 ENCOUNTER — Other Ambulatory Visit: Payer: Self-pay

## 2021-05-23 ENCOUNTER — Ambulatory Visit: Payer: Medicare HMO | Admitting: Physician Assistant

## 2021-05-23 VITALS — BP 184/83 | HR 58 | Temp 97.1°F | Resp 20 | Ht 61.0 in | Wt 106.4 lb

## 2021-05-23 DIAGNOSIS — I6523 Occlusion and stenosis of bilateral carotid arteries: Secondary | ICD-10-CM
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
## 2021-06-13 ENCOUNTER — Ambulatory Visit: Payer: Medicare HMO | Admitting: Podiatry

## 2021-06-27 ENCOUNTER — Ambulatory Visit: Payer: Medicare HMO | Admitting: Podiatry

## 2021-06-27 ENCOUNTER — Encounter: Payer: Self-pay | Admitting: Podiatry

## 2021-06-27 DIAGNOSIS — N1832 Chronic kidney disease, stage 3b: Secondary | ICD-10-CM | POA: Diagnosis not present

## 2021-06-27 DIAGNOSIS — B351 Tinea unguium: Secondary | ICD-10-CM | POA: Diagnosis not present

## 2021-06-27 DIAGNOSIS — M79675 Pain in left toe(s): Secondary | ICD-10-CM | POA: Diagnosis not present

## 2021-06-27 DIAGNOSIS — M79674 Pain in right toe(s): Secondary | ICD-10-CM | POA: Diagnosis not present

## 2021-07-02 NOTE — Progress Notes (Signed)
°  Subjective:  Patient ID: [name], female    DOB: Feb 26, 1933,  MRN: [id number]  Chief Complaint  Patient presents with   Nail Problem    Trim nails    87 y.o. female presents with the above complaint. History confirmed with patient.  States the nails are painful and her Objective:  Physical Exam: warm, good capillary refill, nail exam onychomycosis of the toenails, no trophic changes or ulcerative lesions. DP pulses palpable, PT pulses palpable, and protective sensation intact Left Foot: normal exam, no swelling, tenderness, instability; ligaments intact, full range of motion of all ankle/foot joints  Right Foot: normal exam, no swelling, tenderness, instability; ligaments intact, full range of motion of all ankle/foot joints   Hgb A1c MFr Bld  Date Value Ref Range Status  09/01/2014 5.5 4.8 - 5.6 % Final    Comment:    (NOTE)         Pre-diabetes: 5.7 - 6.4         Diabetes: >6.4         Glycemic control for adults with diabetes: <7.0     No images are attached to the encounter.  Assessment:   1. Pain due to onychomycosis of toenails of both feet   2. Stage 3b chronic kidney disease (HCC)      Plan:  Patient was evaluated and treated and all questions answered.  Onychomycosis -Nails palliatively debrided secondary to pain  Procedure: Nail Debridement Type of Debridement: manual, sharp debridement. Instrumentation: Nail nipper, rotary burr. Number of Nails: 10  No follow-ups on file.
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
## 2021-08-28 ENCOUNTER — Other Ambulatory Visit: Payer: Self-pay

## 2021-08-28 ENCOUNTER — Encounter: Payer: Self-pay | Admitting: Physician Assistant

## 2021-08-28 ENCOUNTER — Ambulatory Visit: Payer: Medicare HMO | Admitting: Physician Assistant

## 2021-08-28 VITALS — BP 160/80 | HR 63 | Ht 59.0 in | Wt 112.0 lb

## 2021-08-28 DIAGNOSIS — I10 Essential (primary) hypertension: Secondary | ICD-10-CM

## 2021-08-28 DIAGNOSIS — I480 Paroxysmal atrial fibrillation: Secondary | ICD-10-CM

## 2021-08-28 DIAGNOSIS — Z952 Presence of prosthetic heart valve: Secondary | ICD-10-CM

## 2021-08-28 DIAGNOSIS — Z953 Presence of xenogenic heart valve: Secondary | ICD-10-CM

## 2021-08-28 DIAGNOSIS — I2510 Atherosclerotic heart disease of native coronary artery without angina pectoris: Secondary | ICD-10-CM

## 2021-08-28 DIAGNOSIS — I5032 Chronic diastolic (congestive) heart failure: Secondary | ICD-10-CM | POA: Diagnosis not present

## 2021-08-28 DIAGNOSIS — I6523 Occlusion and stenosis of bilateral carotid arteries: Secondary | ICD-10-CM | POA: Diagnosis not present

## 2021-08-28 DIAGNOSIS — I7121 Aneurysm of the ascending aorta, without rupture: Secondary | ICD-10-CM

## 2021-08-28 NOTE — Addendum Note (Signed)
Addended by: Myna Hidalgo A on: 08/28/2021 09:48 AM   Modules accepted: Orders

## 2021-08-28 NOTE — Patient Instructions (Signed)
Medication Instructions:  No Changes *If you need a refill on your cardiac medications before your next appointment, please call your pharmacy*   Lab Work: No Labs If you have labs (blood work) drawn today and your tests are completely normal, you will receive your results only by: MyChart Message (if you have MyChart) OR A paper copy in the mail If you have any lab test that is abnormal or we need to change your treatment, we will call you to review the results.   Testing/Procedures: [address]. Your physician has requested that you have an echocardiogram. Echocardiography is a painless test that uses sound waves to create images of your heart. It provides your doctor with information about the size and shape of your heart and how well your hearts chambers and valves are working. This procedure takes approximately one hour. There are no restrictions for this procedure.    Follow-Up: At [facility], you and your health needs are our priority.  As part of our continuing mission to provide you with exceptional heart care, we have created designated Provider Care Teams.  These Care Teams include your primary Cardiologist (physician) and Advanced Practice Providers (APPs -  Physician Assistants and Nurse Practitioners) who all work together to provide you with the care you need, when you need it.  We recommend signing up for the patient portal called "MyChart".  Sign up information is provided on this After Visit Summary.  MyChart is used to connect with patients for Virtual Visits (Telemedicine).  Patients are able to view lab/test results, encounter notes, upcoming appointments, etc.  Non-urgent messages can be sent to your provider as well.   To learn more about what you can do with MyChart, go to [email or web address].    Your next appointment:   6 month(s)  The format for your next appointment:   In Person  Provider:   [name], MD     Other  Instructions Take Blood Pressure Daily. Goal  under 145 Systolic.

## 2021-08-28 NOTE — Addendum Note (Signed)
Addended by: Myna Hidalgo A on: 08/28/2021 09:53 AM   Modules accepted: Orders

## 2021-08-30 ENCOUNTER — Other Ambulatory Visit: Payer: Self-pay | Admitting: Neurosurgery

## 2021-08-30 DIAGNOSIS — S32020A Wedge compression fracture of second lumbar vertebra, initial encounter for closed fracture: Secondary | ICD-10-CM

## 2021-09-12 ENCOUNTER — Telehealth: Payer: Self-pay | Admitting: *Deleted

## 2021-09-12 MED ORDER — HYDRALAZINE HCL 25 MG PO TABS
25.0000 mg | ORAL_TABLET | Freq: Three times a day (TID) | ORAL | 2 refills | Status: AC
Start: 2021-09-12 — End: 2021-12-11

## 2021-09-12 NOTE — Telephone Encounter (Signed)
Patient husband had an appointment with Dr Antoine Poche 09/12/21.  Husband brought in patient's blood pressure reading for Dr Antoine Poche to review ?08/29/21   7:40 195/79   p 64 ?08/30/21   7:40   163/69  p 69 ?08/31/21    7:40   136/64  p 70 ?09/01/21    9:20   156/70  p 73 ?09/02/21     7:40  137/62  p  65 ?09/03/21     7:45   118/58  p 70 ?09/04/21       7:40    146/82  p 69 ?09/07/21       7:40    141/62  p 68 ?09/08/21       11:00   162/70 p 70 ? ? Per Dr Antoine Poche patient is to increase Hydralazine 25 mg to three times a day  ? ? ? RN called patient in forme patient of Dr Antoine Poche  instructions . Patient states at last visit with "the lady doctor" her hydralazine was increase to 25 mg twice a day . ? RN informed patient to increase to 25 mg 3 times a day . New prescription  has been sent  pharmacy 90 day supply  per request.. ? ?Patient verbalized understanding. ?

## 2021-09-19 ENCOUNTER — Ambulatory Visit
Admission: RE | Admit: 2021-09-19 | Discharge: 2021-09-19 | Disposition: A | Discharge status: Home / Self Care | Payer: Medicare HMO | Source: Ambulatory Visit | Attending: Neurosurgery | Admitting: Neurosurgery

## 2021-09-19 ENCOUNTER — Other Ambulatory Visit: Payer: Medicare HMO

## 2021-09-19 ENCOUNTER — Other Ambulatory Visit: Payer: Self-pay

## 2021-09-19 ENCOUNTER — Ambulatory Visit (HOSPITAL_COMMUNITY): Payer: Medicare HMO | Attending: Cardiology

## 2021-09-19 DIAGNOSIS — I6523 Occlusion and stenosis of bilateral carotid arteries: Secondary | ICD-10-CM | POA: Diagnosis not present

## 2021-09-19 DIAGNOSIS — Z953 Presence of xenogenic heart valve: Secondary | ICD-10-CM | POA: Diagnosis present

## 2021-09-19 DIAGNOSIS — I7121 Aneurysm of the ascending aorta, without rupture: Secondary | ICD-10-CM | POA: Insufficient documentation

## 2021-09-19 DIAGNOSIS — I480 Paroxysmal atrial fibrillation: Secondary | ICD-10-CM | POA: Diagnosis present

## 2021-09-19 DIAGNOSIS — I5032 Chronic diastolic (congestive) heart failure: Secondary | ICD-10-CM

## 2021-09-19 DIAGNOSIS — Z952 Presence of prosthetic heart valve: Secondary | ICD-10-CM

## 2021-09-19 DIAGNOSIS — I10 Essential (primary) hypertension: Secondary | ICD-10-CM | POA: Diagnosis not present

## 2021-09-19 DIAGNOSIS — I2510 Atherosclerotic heart disease of native coronary artery without angina pectoris: Secondary | ICD-10-CM | POA: Diagnosis not present

## 2021-09-19 LAB — ECHOCARDIOGRAM COMPLETE
AV Mean grad: 19 mmHg
AV Peak grad: 34.6 mmHg
Ao pk vel: 2.94 m/s
Area-P 1/2: 2.83 cm2
S' Lateral: 2.8 cm

## 2021-09-23 ENCOUNTER — Telehealth: Payer: Self-pay | Admitting: Cardiology

## 2021-09-23 NOTE — Telephone Encounter (Signed)
Patient would like results to her x-ray ?

## 2021-09-23 NOTE — Telephone Encounter (Signed)
Spoke with pt regarding echocardiogram. Let pt know that results have not be reviewed by Dr. Antoine Poche yet. Advised pt that we would be in touch with results as soon as possible. Pt verbalized understanding.  ?

## 2021-09-25 NOTE — Telephone Encounter (Signed)
Spoke with pt regarding echo results. Results given and pt verbalizes understanding. While on the phone pt mentions increased left arm pain. Pt does have history of CABG. Pt states that this feels like her heart pain that she felt prior to CABG. Explained to pt that having an echocardiogram will not give [place] any indication of how the heart arteries or grafts are doing. Appointment scheduled for pt with Dr. Antoine Poche on DOD day. Pt given ED precautions. Pt verbalizes understanding.  ?

## 2021-09-26 ENCOUNTER — Telehealth: Payer: Self-pay

## 2021-09-26 NOTE — Telephone Encounter (Addendum)
Called patient regarding results. Patient had understanding of results.----- Message from Cannon Kettle, PA-C sent at 09/24/2021 12:15 PM EDT ----- ?History of AVR in 2009, TAVR in 2016. ?Good news -echo shows normal structure and function of aortic valve prosthesis.  Gradients stable from last year. ? ?

## 2021-09-27 ENCOUNTER — Telehealth: Payer: Self-pay | Admitting: *Deleted

## 2021-09-27 NOTE — Telephone Encounter (Signed)
? ?  Pre-operative Risk Assessment  ?  ?Patient Name: [name]  ?DOB: 1933/06/19 ?MRN: [id number]  ? ?  ? ?Request for Surgical Clearance   ? ?Procedure:   L2 Kyphoplasty  ? ?Date of Surgery:  Clearance TBD                              ?   ?Surgeon:  Dr Julio Sicks  ?Surgeon's Group or Practice Name:  [facility]  ?Phone number:  [phone]  ext [phone] attn Vanessa ?Fax number:  [phone]  ?  ?Type of Clearance Requested:   ?- Medical  ?  ?Type of Anesthesia:  General  ?  ?Additional requests/questions:  Please advise surgeon/provider what medications should be held. ? ?Signed, ?Burt Knack V   ?09/27/2021, 8:54 AM   ?

## 2021-09-27 NOTE — Telephone Encounter (Signed)
Primary Cardiologist:James Hochrein, MD ? ?Chart reviewed as part of pre-operative protocol coverage. Because of [name]'s past medical history and time since last visit, he/she will require a follow-up visit in order to better assess preoperative cardiovascular risk. ? ?Pre-op covering staff: ?- Please schedule appointment and call patient to inform them. ?- Please contact requesting surgeon's office via preferred method (i.e, phone, fax) to inform them of need for appointment prior to surgery. ? ?If applicable, this message will also be routed to pharmacy pool and/or primary cardiologist for input on holding anticoagulant/antiplatelet agent as requested below so that this information is available at time of patient's appointment.  ? ?[name], NP  ?09/27/2021, 9:53 AM  ? ?

## 2021-09-27 NOTE — Telephone Encounter (Signed)
Pt has appt with Dr. Antoine Poche 10/03/21. I have added needs pre op clearance to appt notes. I will forward notes to MD for upcoming appt. Will send FYI to requesting office the pt has appt 10/03/21.  ?
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
## 2021-10-03 ENCOUNTER — Ambulatory Visit: Payer: Medicare HMO | Admitting: Cardiology

## 2021-10-03 ENCOUNTER — Encounter: Payer: Self-pay | Admitting: Cardiology

## 2021-10-03 ENCOUNTER — Ambulatory Visit: Payer: Medicare HMO | Admitting: Podiatry

## 2021-10-03 VITALS — BP 118/70 | HR 75 | Ht 59.0 in | Wt 112.2 lb

## 2021-10-03 DIAGNOSIS — I5032 Chronic diastolic (congestive) heart failure: Secondary | ICD-10-CM

## 2021-10-03 DIAGNOSIS — I2510 Atherosclerotic heart disease of native coronary artery without angina pectoris: Secondary | ICD-10-CM | POA: Diagnosis not present

## 2021-10-03 DIAGNOSIS — Z01810 Encounter for preprocedural cardiovascular examination: Secondary | ICD-10-CM

## 2021-10-03 DIAGNOSIS — Z952 Presence of prosthetic heart valve: Secondary | ICD-10-CM | POA: Diagnosis not present

## 2021-10-03 MED ORDER — ASPIRIN EC 81 MG PO TBEC: 81.0000 mg | DELAYED_RELEASE_TABLET | Freq: Every day | ORAL | 3 refills | Status: DC

## 2021-10-03 NOTE — Patient Instructions (Signed)
Medication Instructions:  ?START Aspirin 81 mg after the procedure ? ?*If you need a refill on your cardiac medications before your next appointment, please call your pharmacy* ? ? ?Lab Work: ?None ordered ?If you have labs (blood work) drawn today and your tests are completely normal, you will receive your results only by: ?MyChart Message (if you have MyChart) OR ?A paper copy in the mail ?If you have any lab test that is abnormal or we need to change your treatment, we will call you to review the results. ? ? ?Testing/Procedures: ?None ordered ? ? ?Follow-Up: ?At [facility], you and your health needs are our priority.  As part of our continuing mission to provide you with exceptional heart care, we have created designated Provider Care Teams.  These Care Teams include your primary Cardiologist (physician) and Advanced Practice Providers (APPs -  Physician Assistants and Nurse Practitioners) who all work together to provide you with the care you need, when you need it. ? ?We recommend signing up for the patient portal called "MyChart".  Sign up information is provided on this After Visit Summary.  MyChart is used to connect with patients for Virtual Visits (Telemedicine).  Patients are able to view lab/test results, encounter notes, upcoming appointments, etc.  Non-urgent messages can be sent to your provider as well.   ?To learn more about what you can do with MyChart, go to [email or web address].   ? ?Your next appointment:   ?12 month(s) ? ?The format for your next appointment:   ?In Person ? ?Provider:   ?[name], MD { ? ?

## 2021-10-11 NOTE — Pre-Procedure Instructions (Signed)
Surgical Instructions ? ? ? Your procedure is scheduled on Friday, October 25, 2021 at 8:00 AM. ? Report to [facility] Main Entrance "A" at 6:00 A.M., then check in with the Admitting office. ? Call this number if you have problems the morning of surgery: ? [phone] ? ? If you have any questions prior to your surgery date call [phone]: Open Monday-Friday 8am-4pm ? ? ? Remember: ? Do not eat after midnight the night before your surgery ? ?You may drink clear liquids until 5:00 AM the morning of your surgery.   ?Clear liquids allowed are: Water, Non-Citrus Juices (without pulp), Carbonated Beverages, Clear Tea, Black Coffee Only (NO MILK, CREAM OR POWDERED CREAMER of any kind), and Gatorade. ?  ? Take these medicines the morning of surgery with A SIP OF WATER: ? ?amiodarone (PACERONE) ?atorvastatin (LIPITOR) ?azithromycin [facility]) ?dorzolamide-timolol (COSOPT)  ?hydrALAZINE (APRESOLINE) ? ?IF NEEDED: ?acetaminophen (TYLENOL) ?nitroGLYCERIN (NITROSTAT) ?rOPINIRole (REQUIP) ? ?Follow your surgeon's instructions on when to stop Aspirin.  If no instructions were given by your surgeon then you will need to call the office to get those instructions.   ? ? ?As of today, STOP taking any Aleve, Naproxen, Ibuprofen, Motrin, Advil, Goody's, BC's, all herbal medications, fish oil, and all vitamins. ?         ?           ?Do NOT Smoke (Tobacco/Vaping) for 24 hours prior to your procedure. ? ?If you use a CPAP at night, you may bring your mask/headgear for your overnight stay. ?  ?Contacts, glasses, piercing's, hearing aid's, dentures or partials may not be worn into surgery, please bring cases for these belongings.  ?  ?For patients admitted to the hospital, discharge time will be determined by your treatment team. ?  ?Patients discharged the day of surgery will not be allowed to drive home, and someone needs to stay with them for 24 hours. ? ?SURGICAL WAITING ROOM VISITATION ?Patients having surgery or a procedure may  have two support people in the waiting area. ?Visitors may stay in the waiting area during the procedure and switch out with other visitors if needed. ?Children under the age of 39 must have an adult accompany them who is not the patient. ?If the patient needs to stay at the hospital during part of their recovery, the visitor guidelines for inpatient rooms apply. ? ?Please refer to the [facility] website for the visitor guidelines for Inpatients (after your surgery is over and you are in a regular room).  ? ? ?Special instructions:   ?[facility]- Preparing For Surgery ? ?Before surgery, you can play an important role. Because skin is not sterile, your skin needs to be as free of germs as possible. You can reduce the number of germs on your skin by washing with CHG (chlorahexidine gluconate) Soap before surgery.  CHG is an antiseptic cleaner which kills germs and bonds with the skin to continue killing germs even after washing.   ? ?Oral Hygiene is also important to reduce your risk of infection.  Remember - BRUSH YOUR TEETH THE MORNING OF SURGERY WITH YOUR REGULAR TOOTHPASTE ? ?Please do not use if you have an allergy to CHG or antibacterial soaps. If your skin becomes reddened/irritated stop using the CHG.  ?Do not shave (including legs and underarms) for at least 48 hours prior to first CHG shower. It is OK to shave your face. ? ?Please follow these instructions carefully. ?  ?Shower the NIGHT BEFORE SURGERY and the  MORNING OF SURGERY ? ?If you chose to wash your hair, wash your hair first as usual with your normal shampoo. ? ?After you shampoo, rinse your hair and body thoroughly to remove the shampoo. ? ?Use CHG Soap as you would any other liquid soap. You can apply CHG directly to the skin and wash gently with a scrungie or a clean washcloth.  ? ?Apply the CHG Soap to your body ONLY FROM THE NECK DOWN.  Do not use on open wounds or open sores. Avoid contact with your eyes, ears, mouth and genitals (private  parts). Wash Face and genitals (private parts)  with your normal soap.  ? ?Wash thoroughly, paying special attention to the area where your surgery will be performed. ? ?Thoroughly rinse your body with warm water from the neck down. ? ?DO NOT shower/wash with your normal soap after using and rinsing off the CHG Soap. ? ?Pat yourself dry with a CLEAN TOWEL. ? ?Wear CLEAN PAJAMAS to bed the night before surgery ? ?Place CLEAN SHEETS on your bed the night before your surgery ? ?DO NOT SLEEP WITH PETS. ? ? ?Day of Surgery: ?Take a shower with CHG soap. ?Do not wear jewelry or makeup. ?Do not wear lotions, powders, perfumes, or deodorant. ?Do not shave 48 hours prior to surgery. ?Do not bring valuables to the hospital.  ?[facility] is not responsible for any belongings or valuables. ?Do not wear nail polish, gel polish, artificial nails, or any other type of covering on natural nails (fingers and toes) ?If you have artificial nails or gel coating that need to be removed by a nail salon, please have this removed prior to surgery. Artificial nails or gel coating may interfere with anesthesia's ability to adequately monitor your vital signs. ?Wear Clean/Comfortable clothing the morning of surgery ?Do not apply any deodorants/lotions.   ?Remember to brush your teeth WITH YOUR REGULAR TOOTHPASTE. ?  ?Please read over the following fact sheets that you were given. ? ?If you received a COVID test during your pre-op visit  it is requested that you wear a mask when out in public, stay away from anyone that may not be feeling well and notify your surgeon if you develop symptoms. If you have been in contact with anyone that has tested positive in the last 10 days please notify you surgeon. ?  ? ?

## 2021-10-14 ENCOUNTER — Encounter (HOSPITAL_COMMUNITY): Payer: Self-pay

## 2021-10-14 ENCOUNTER — Other Ambulatory Visit: Payer: Self-pay

## 2021-10-14 ENCOUNTER — Encounter (HOSPITAL_COMMUNITY)
Admission: RE | Admit: 2021-10-14 | Discharge: 2021-10-14 | Disposition: A | Discharge status: Home / Self Care | Payer: Medicare HMO | Source: Ambulatory Visit | Attending: Neurosurgery | Admitting: Neurosurgery

## 2021-10-14 VITALS — BP 149/91 | HR 70 | Temp 97.6°F | Resp 17 | Ht 60.0 in | Wt 110.9 lb

## 2021-10-14 DIAGNOSIS — Z01818 Encounter for other preprocedural examination: Secondary | ICD-10-CM

## 2021-10-14 DIAGNOSIS — I700 Atherosclerosis of aorta: Secondary | ICD-10-CM | POA: Diagnosis not present

## 2021-10-14 DIAGNOSIS — N183 Chronic kidney disease, stage 3 unspecified: Secondary | ICD-10-CM | POA: Insufficient documentation

## 2021-10-14 DIAGNOSIS — J984 Other disorders of lung: Secondary | ICD-10-CM | POA: Insufficient documentation

## 2021-10-14 DIAGNOSIS — Z01812 Encounter for preprocedural laboratory examination: Secondary | ICD-10-CM | POA: Diagnosis present

## 2021-10-14 DIAGNOSIS — Z952 Presence of prosthetic heart valve: Secondary | ICD-10-CM | POA: Insufficient documentation

## 2021-10-14 DIAGNOSIS — I083 Combined rheumatic disorders of mitral, aortic and tricuspid valves: Secondary | ICD-10-CM | POA: Insufficient documentation

## 2021-10-14 DIAGNOSIS — I7121 Aneurysm of the ascending aorta, without rupture: Secondary | ICD-10-CM | POA: Diagnosis not present

## 2021-10-14 DIAGNOSIS — Z951 Presence of aortocoronary bypass graft: Secondary | ICD-10-CM | POA: Diagnosis not present

## 2021-10-14 HISTORY — DX: Anemia, unspecified: D64.9

## 2021-10-14 HISTORY — DX: Pneumonia, unspecified organism: J18.9

## 2021-10-14 LAB — CBC
HCT: 38 % (ref 36.0–46.0)
Hemoglobin: 12 g/dL (ref 12.0–15.0)
MCH: 29.4 pg (ref 26.0–34.0)
MCHC: 31.6 g/dL (ref 30.0–36.0)
MCV: 93.1 fL (ref 80.0–100.0)
Platelets: 206 10*3/uL (ref 150–400)
RBC: 4.08 MIL/uL (ref 3.87–5.11)
RDW: 16.9 % — ABNORMAL HIGH (ref 11.5–15.5)
WBC: 9.4 10*3/uL (ref 4.0–10.5)
nRBC: 0 % (ref 0.0–0.2)

## 2021-10-14 LAB — SURGICAL PCR SCREEN
MRSA, PCR: NEGATIVE
Staphylococcus aureus: NEGATIVE

## 2021-10-14 LAB — BASIC METABOLIC PANEL
Anion gap: 6 (ref 5–15)
BUN: 21 mg/dL (ref 8–23)
CO2: 22 mmol/L (ref 22–32)
Calcium: 9.8 mg/dL (ref 8.9–10.3)
Chloride: 112 mmol/L — ABNORMAL HIGH (ref 98–111)
Creatinine, Ser: 1.42 mg/dL — ABNORMAL HIGH (ref 0.44–1.00)
GFR, Estimated: 36 mL/min — ABNORMAL LOW (ref >60)
Glucose, Bld: 116 mg/dL — ABNORMAL HIGH (ref 70–99)
Potassium: 4.1 mmol/L (ref 3.5–5.1)
Sodium: 140 mmol/L (ref 135–145)

## 2021-10-14 NOTE — Progress Notes (Signed)
PCP - Dineen Kid MD ?Cardiologist - Minus Breeding MD ? ?PPM/ICD - denies ?Device Orders -  ?Rep Notified -  ? ?Chest x-ray - no ?EKG - 08/28/21 ?Stress Test - 07/05/07 ?ECHO - 09/19/21 ?Cardiac Cath - 2014 ? ?Sleep Study - denies ?CPAP -  ? ?Fasting Blood Sugar - n/a ?Checks Blood Sugar _____ times a day ? ?Blood Thinner Instructions:n/a ?Aspirin Instructions:pt said aspirin will start after surgery.  ? ?ERAS Protcol -clear liquids until 0500 ?PRE-SURGERY Ensure or G2- no ? ?COVID TEST- n/a ? ? ?Anesthesia review: yes-cardiac history ? ?Patient denies shortness of breath, fever, cough and chest pain at PAT appointment ? ? ?All instructions explained to the patient, with a verbal understanding of the material. Patient agrees to go over the instructions while at home for a better understanding. Patient also instructed to wear a mask while out in public prior to surgery. The opportunity to ask questions was provided. ?  ?

## 2021-10-15 NOTE — Progress Notes (Signed)
Anesthesia Chart Review: ? ?[place] cardiology for history of CAD s/p CABG 2009 with concomitant tissue AVR and subsequent TAVR 09/2014, paroxysmal atrial fibrillation (not on anticoagulation due to history of anemia), 4.8 cm ascending aortic aneurysm (not actively being followed as patient is not felt to be a candidate for redo surgery), left bundle branch block, carotid stenosis (bilateral 40-59% ICA stenosis by duplex 05/2021).  Seen by Dr. Percival Spanish 10/03/2021 for preop evaluation.  Per note, "Preop: The patient has no high risk findings.  She is not going for high risk procedure.  She is at acceptable risk for the planned procedure." ? ?CKD 3 followed at [facility]. ? ?Follows with pulmonology at [facility] for history of bronchiectasis.  Treated for exacerbation on 07/03/2021.  At last follow-up on 07/24/2021 she was noted to be doing well, stable cough.  She was advised to follow-up in 1 year. ? ?Preop labs reviewed, creatinine 1.42 consistent with history of CKD 3, labs otherwise unremarkable. ? ?EKG 08/28/2021: Normal sinus rhythm with sinus arrhythmia.  Rate 63.  Left bundle branch block. ? ?CHEST - 2 VIEW 9022 (Care Everywhere): ?COMPARISON:  Portable chest 12/08/2020 and earlier.  ? ?FINDINGS:  ?Chronic TAVR and CABG. Cardiomegaly appears mildly regressed since  ?last year. Calcified aortic atherosclerosis. Other mediastinal  ?contours are within normal limits.  ? ?Larger lung volumes. Visualized tracheal air column is within normal  ?limits. No pneumothorax, pulmonary edema, pleural effusion or  ?confluent pulmonary opacity. Chronic bronchiectasis and lung  ?scarring demonstrated by CT last year is less apparent.  ? ?No acute osseous abnormality identified. Previous anterior and  ?posterior cervical spine fusion. Stable cholecystectomy clips.  ?Negative visible bowel gas pattern.  ? ?IMPRESSION:  ?1. Chronic lung disease. Improved ventilation since [place] with no  ?acute pulmonary abnormality.  ?2. Chronic  cardiomegaly, Aortic Atherosclerosis (ICD10-I70.0).   ? ? ?TTE 09/19/21: ? 1. Left ventricular ejection fraction, by estimation, is 60 to 65%. The  ?left ventricle has normal function. The left ventricle has no regional  ?wall motion abnormalities. There is moderate left ventricular hypertrophy.  ?Left ventricular diastolic function  ? could not be evaluated. The average left ventricular global longitudinal  ?strain is -18.7 %. The global longitudinal strain is normal.  ? 2. Right ventricular systolic function is mildly reduced. The right  ?ventricular size is normal. There is moderately elevated pulmonary artery  ?systolic pressure. The estimated right ventricular systolic pressure is  ?0000000 mmHg.  ? 3. Left atrial size was moderately dilated.  ? 4. Right atrial size was mildly dilated.  ? 5. The mitral valve is abnormal. Mild mitral valve regurgitation. Mild  ?mitral stenosis. The mean mitral valve gradient is 6.0 mmHg. Severe mitral  ?annular calcification.  ? 6. Tricuspid valve regurgitation is mild to moderate.  ? 7. The aortic valve has been repaired/replaced. Aortic valve  ?regurgitation is not visualized. There is a 20 mm Sapien prosthetic (TAVR)  ?valve present in the aortic position. Procedure Date: 2016. Echo findings  ?are consistent with normal structure and  ?function of the aortic valve prosthesis. Aortic valve mean gradient  ?measures 19.0 mmHg.  ? 8. Aortic dilatation noted. There is mild dilatation of the ascending  ?aorta, measuring 44 mm.  ? 9. The inferior vena cava is normal in size with greater than 50%  ?respiratory variability, suggesting right atrial pressure of 3 mmHg.  ? ?Comparison(s): No significant change from prior study. 09/19/20 EF 60-65%.  ?Mild MS 61mmHg mean PG. AV 87mmHg mean PG,  65mmHg peak PG.  ? ? ?Karoline Caldwell, PA-C ?[facility] Stay [facility]/Anesthesiology ?Phone [phone] ?10/15/2021 4:30 PM ? ?

## 2021-10-15 NOTE — Anesthesia Preprocedure Evaluation (Addendum)
Anesthesia Evaluation  ?Patient identified by MRN, date of birth, ID band ?Patient awake ? ? ? ?Reviewed: ?Allergy & Precautions, NPO status , Patient's Chart, lab work & pertinent test results ? ?History of Anesthesia Complications ?(+) PONV, POST - OP SPINAL HEADACHE and history of anesthetic complications ? ?Airway ?Mallampati: II ? ?TM Distance: >3 FB ?Neck ROM: Full ? ? ? Dental ? ?(+) Edentulous Upper, Edentulous Lower, Dental Advisory Given ?  ?Pulmonary ?shortness of breath,  ?  ?Pulmonary exam normal ?breath sounds clear to auscultation ? ? ? ? ? ? Cardiovascular ?hypertension, Pt. on medications ?pulmonary hypertension+ angina + CAD and +CHF  ?Normal cardiovascular exam+ dysrhythmias + Valvular Problems/Murmurs AS  ?Rhythm:Regular Rate:Normal ? ?Echo 09/2021 ?1. Left ventricular ejection fraction, by estimation, is 60 to 65%. The left ventricle has normal function. The left ventricle has no regional wall motion abnormalities. There is moderate left ventricular hypertrophy. Left ventricular diastolic function ?could not be evaluated. The average left ventricular global longitudinal strain is -18.7 %. The global longitudinal strain is normal.  ?2. Right ventricular systolic function is mildly reduced. The right ventricular size is normal. There is moderately elevated pulmonary artery systolic pressure. The estimated right ventricular systolic pressure is 0000000 mmHg.  ?3. Left atrial size was moderately dilated.  ?4. Right atrial size was mildly dilated.  ?5. The mitral valve is abnormal. Mild mitral valve regurgitation. Mild mitral stenosis. The mean mitral valve gradient is 6.0 mmHg. Severe mitral annular calcification.  ??6. Tricuspid valve regurgitation is mild to moderate.  ??7. The aortic valve has been repaired/replaced. Aortic valve regurgitation is not visualized. There is a 20 mm Sapien prosthetic (TAVR) valve present in the aortic position. Procedure Date: 2016. Echo  findings  ?are consistent with normal structure and function of the aortic valve prosthesis. Aortic valve mean gradient measures 19.0 mmHg.  ??8. Aortic dilatation noted. There is mild dilatation of the ascending aorta, measuring 44 mm.  ??9. The inferior vena cava is normal in size with greater than 50% respiratory variability, suggesting right atrial pressure of 3 mmHg.  ?  ?Neuro/Psych ?negative neurological ROS ?   ? GI/Hepatic ?Neg liver ROS, hiatal hernia, GERD  ,  ?Endo/Other  ?negative endocrine ROS ? Renal/GU ?Renal disease  ? ?  ?Musculoskeletal ? ?(+) Arthritis ,  ? Abdominal ?  ?Peds ? Hematology ? ?(+) Blood dyscrasia, anemia ,   ?Anesthesia Other Findings ? ? Reproductive/Obstetrics ? ?  ? ? ? ? ? ? ? ? ? ? ? ? ? ?  ?  ? ? ? ? ? ? ?Anesthesia Physical ?Anesthesia Plan ? ?ASA: 4 ? ?Anesthesia Plan: General  ? ?Post-op Pain Management: Minimal or no pain anticipated  ? ?Induction: Intravenous ? ?PONV Risk Score and Plan: 4 or greater and Ondansetron, Dexamethasone and Treatment may vary due to age or medical condition ? ?Airway Management Planned: Oral ETT ? ?Additional Equipment:  ? ?Intra-op Plan:  ? ?Post-operative Plan: Extubation in OR ? ?Informed Consent: I have reviewed the patients History and Physical, chart, labs and discussed the procedure including the risks, benefits and alternatives for the proposed anesthesia with the patient or authorized representative who has indicated his/her understanding and acceptance.  ? ? ? ?Dental advisory given ? ?Plan Discussed with: CRNA ? ?Anesthesia Plan Comments: (PAT note by Karoline Caldwell, PA-C: ?[name] for history of CAD s/p CABG 2009 with concomitant tissue AVR and subsequent TAVR 09/2014, paroxysmal atrial fibrillation (not on anticoagulation due to history of anemia),  4.8 cm ascending aortic aneurysm (not actively being followed as patient is not felt to be a candidate for redo surgery), left bundle branch block, carotid stenosis (bilateral  40-59% ICA stenosis by duplex 05/2021).  Seen by Dr. Percival Spanish 10/03/2021 for preop evaluation.  Per note, "Preop:?The patient has no high risk findings. ?She is not going for high risk procedure. ?She is at acceptable risk for the planned procedure." ? ?CKD 3 followed at [facility]. ? ?Follows with pulmonology at [facility] for history of bronchiectasis.  Treated for exacerbation on 07/03/2021.  At last follow-up on 07/24/2021 she was noted to be doing well, stable cough.  She was advised to follow-up in 1 year. ? ?Preop labs reviewed, creatinine 1.42 consistent with history of CKD 3, labs otherwise unremarkable. ? ?EKG 08/28/2021: Normal sinus rhythm with sinus arrhythmia.  Rate 63.  Left bundle branch block. ? ?CHEST - 2 VIEW 9022 (Care Everywhere): ?COMPARISON: ?Portable chest 12/08/2020 and earlier.  ? ?FINDINGS:  ?Chronic TAVR and CABG. Cardiomegaly appears mildly regressed since  ?last year. Calcified aortic atherosclerosis. Other mediastinal  ?contours are within normal limits.  ? ?Larger lung volumes. Visualized tracheal air column is within normal  ?limits. No pneumothorax, pulmonary edema, pleural effusion or  ?confluent pulmonary opacity.?Chronic bronchiectasis and lung  ?scarring demonstrated by CT last year is less apparent.  ? ?No acute osseous abnormality identified. Previous anterior and  ?posterior cervical spine fusion. Stable cholecystectomy clips.  ?Negative visible bowel gas pattern.  ? ?IMPRESSION:  ?1. Chronic lung disease. Improved ventilation since Alfa with no  ?acute pulmonary abnormality.  ?2. Chronic cardiomegaly, Aortic Atherosclerosis (ICD10-I70.0).   ? ? ?TTE 09/19/21: ??1. Left ventricular ejection fraction, by estimation, is 60 to 65%. The  ?left ventricle has normal function. The left ventricle has no regional  ?wall motion abnormalities. There is moderate left ventricular hypertrophy.  ?Left ventricular diastolic function  ??could not be evaluated. The average left ventricular  global longitudinal  ?strain is -18.7 %. The global longitudinal strain is normal.  ??2. Right ventricular systolic function is mildly reduced. The right  ?ventricular size is normal. There is moderately elevated pulmonary artery  ?systolic pressure. The estimated right ventricular systolic pressure is  ?0000000 mmHg.  ??3. Left atrial size was moderately dilated.  ??4. Right atrial size was mildly dilated.  ??5. The mitral valve is abnormal. Mild mitral valve regurgitation. Mild  ?mitral stenosis. The mean mitral valve gradient is 6.0 mmHg. Severe mitral  ?annular calcification.  ??6. Tricuspid valve regurgitation is mild to moderate.  ??7. The aortic valve has been repaired/replaced. Aortic valve  ?regurgitation is not visualized. There is a 20 mm Sapien prosthetic (TAVR)  ?valve present in the aortic position. Procedure Date: 2016. Echo findings  ?are consistent with normal structure and  ?function of the aortic valve prosthesis. Aortic valve mean gradient  ?measures 19.0 mmHg.  ??8. Aortic dilatation noted. There is mild dilatation of the ascending  ?aorta, measuring 44 mm.  ??9. The inferior vena cava is normal in size with greater than 50%  ?respiratory variability, suggesting right atrial pressure of 3 mmHg.  ? ?Comparison(s): No significant change from prior study. 09/19/20 EF 60-65%.  ?Mild MS 78mmHg mean PG. AV 20mmHg mean PG, 34mmHg peak PG.  ? ?)  ? ? ? ? ?Anesthesia Quick Evaluation ? ?

## 2021-10-17 ENCOUNTER — Ambulatory Visit (INDEPENDENT_AMBULATORY_CARE_PROVIDER_SITE_OTHER): Payer: Medicare HMO | Admitting: Podiatry

## 2021-10-17 ENCOUNTER — Encounter: Payer: Self-pay | Admitting: Podiatry

## 2021-10-17 DIAGNOSIS — M79674 Pain in right toe(s): Secondary | ICD-10-CM | POA: Diagnosis not present

## 2021-10-17 DIAGNOSIS — B351 Tinea unguium: Secondary | ICD-10-CM | POA: Diagnosis not present

## 2021-10-17 DIAGNOSIS — M79675 Pain in left toe(s): Secondary | ICD-10-CM

## 2021-10-25 ENCOUNTER — Ambulatory Visit (HOSPITAL_COMMUNITY)
Admission: RE | Admit: 2021-10-25 | Discharge: 2021-10-25 | Disposition: A | Discharge status: Home / Self Care | Payer: Medicare HMO | Attending: Gynecology | Admitting: Gynecology

## 2021-10-25 ENCOUNTER — Other Ambulatory Visit: Payer: Self-pay

## 2021-10-25 ENCOUNTER — Ambulatory Visit (HOSPITAL_BASED_OUTPATIENT_CLINIC_OR_DEPARTMENT_OTHER): Payer: Medicare HMO | Admitting: Certified Registered Nurse Anesthetist

## 2021-10-25 ENCOUNTER — Encounter (HOSPITAL_COMMUNITY): Payer: Self-pay | Admitting: Neurosurgery

## 2021-10-25 ENCOUNTER — Ambulatory Visit (HOSPITAL_COMMUNITY): Payer: Medicare HMO | Admitting: Physician Assistant

## 2021-10-25 ENCOUNTER — Ambulatory Visit (HOSPITAL_COMMUNITY): Payer: Medicare HMO

## 2021-10-25 ENCOUNTER — Encounter (HOSPITAL_COMMUNITY)
Admission: RE | Disposition: A | Discharge status: Home / Self Care | Payer: Self-pay | Source: Home / Self Care | Attending: Neurosurgery

## 2021-10-25 DIAGNOSIS — I25119 Atherosclerotic heart disease of native coronary artery with unspecified angina pectoris: Secondary | ICD-10-CM | POA: Diagnosis not present

## 2021-10-25 DIAGNOSIS — K219 Gastro-esophageal reflux disease without esophagitis: Secondary | ICD-10-CM | POA: Insufficient documentation

## 2021-10-25 DIAGNOSIS — M810 Age-related osteoporosis without current pathological fracture: Secondary | ICD-10-CM

## 2021-10-25 DIAGNOSIS — I509 Heart failure, unspecified: Secondary | ICD-10-CM | POA: Diagnosis not present

## 2021-10-25 DIAGNOSIS — D649 Anemia, unspecified: Secondary | ICD-10-CM | POA: Insufficient documentation

## 2021-10-25 DIAGNOSIS — M4856XA Collapsed vertebra, not elsewhere classified, lumbar region, initial encounter for fracture: Secondary | ICD-10-CM | POA: Diagnosis not present

## 2021-10-25 DIAGNOSIS — N189 Chronic kidney disease, unspecified: Secondary | ICD-10-CM | POA: Diagnosis not present

## 2021-10-25 DIAGNOSIS — D759 Disease of blood and blood-forming organs, unspecified: Secondary | ICD-10-CM | POA: Insufficient documentation

## 2021-10-25 DIAGNOSIS — M8008XA Age-related osteoporosis with current pathological fracture, vertebra(e), initial encounter for fracture: Secondary | ICD-10-CM | POA: Insufficient documentation

## 2021-10-25 DIAGNOSIS — I272 Pulmonary hypertension, unspecified: Secondary | ICD-10-CM | POA: Diagnosis not present

## 2021-10-25 DIAGNOSIS — M1990 Unspecified osteoarthritis, unspecified site: Secondary | ICD-10-CM | POA: Insufficient documentation

## 2021-10-25 DIAGNOSIS — K449 Diaphragmatic hernia without obstruction or gangrene: Secondary | ICD-10-CM | POA: Diagnosis not present

## 2021-10-25 DIAGNOSIS — I130 Hypertensive heart and chronic kidney disease with heart failure and stage 1 through stage 4 chronic kidney disease, or unspecified chronic kidney disease: Secondary | ICD-10-CM | POA: Insufficient documentation

## 2021-10-25 DIAGNOSIS — I110 Hypertensive heart disease with heart failure: Secondary | ICD-10-CM

## 2021-10-25 DIAGNOSIS — I350 Nonrheumatic aortic (valve) stenosis: Secondary | ICD-10-CM | POA: Diagnosis not present

## 2021-10-25 DIAGNOSIS — R0602 Shortness of breath: Secondary | ICD-10-CM | POA: Insufficient documentation

## 2021-10-25 HISTORY — PX: KYPHOPLASTY: SHX5884

## 2021-10-25 SURGERY — KYPHOPLASTY
Anesthesia: General | Site: Spine Lumbar

## 2021-10-25 MED ORDER — FENTANYL CITRATE (PF) 100 MCG/2ML IJ SOLN: 25.0000 ug | INTRAMUSCULAR | Status: DC | PRN

## 2021-10-25 MED ORDER — DROPERIDOL 2.5 MG/ML IJ SOLN: 0.6250 mg | Freq: Once | INTRAMUSCULAR | Status: DC | PRN

## 2021-10-25 MED ORDER — FENTANYL CITRATE (PF) 250 MCG/5ML IJ SOLN
INTRAMUSCULAR | Status: DC | PRN
  Administered 2021-10-25: 50 ug via INTRAVENOUS

## 2021-10-25 MED ORDER — LIDOCAINE 2% (20 MG/ML) 5 ML SYRINGE
INTRAMUSCULAR | Status: DC | PRN
Start: 2021-10-25 — End: 2021-10-25
  Administered 2021-10-25: 40 mg via INTRAVENOUS

## 2021-10-25 MED ORDER — FENTANYL CITRATE (PF) 250 MCG/5ML IJ SOLN
INTRAMUSCULAR | Status: AC
  Filled 2021-10-25: qty 5

## 2021-10-25 MED ORDER — LIDOCAINE 2% (20 MG/ML) 5 ML SYRINGE
INTRAMUSCULAR | Status: AC
  Filled 2021-10-25: qty 5

## 2021-10-25 MED ORDER — TRAMADOL HCL 50 MG PO TABS: 50.0000 mg | ORAL_TABLET | Freq: Four times a day (QID) | ORAL | 0 refills | Status: AC | PRN

## 2021-10-25 MED ORDER — ONDANSETRON HCL 4 MG/2ML IJ SOLN
INTRAMUSCULAR | Status: DC | PRN
  Administered 2021-10-25: 4 mg via INTRAVENOUS

## 2021-10-25 MED ORDER — IOPAMIDOL (ISOVUE-300) INJECTION 61%
INTRAVENOUS | Status: DC | PRN
  Administered 2021-10-25: 100 mL

## 2021-10-25 MED ORDER — LACTATED RINGERS IV SOLN: INTRAVENOUS | Status: DC | PRN

## 2021-10-25 MED ORDER — ROCURONIUM BROMIDE 10 MG/ML (PF) SYRINGE
PREFILLED_SYRINGE | INTRAVENOUS | Status: AC
  Filled 2021-10-25: qty 10

## 2021-10-25 MED ORDER — ROCURONIUM BROMIDE 10 MG/ML (PF) SYRINGE
PREFILLED_SYRINGE | INTRAVENOUS | Status: DC | PRN
  Administered 2021-10-25: 30 mg via INTRAVENOUS

## 2021-10-25 MED ORDER — SUGAMMADEX SODIUM 200 MG/2ML IV SOLN
INTRAVENOUS | Status: DC | PRN
  Administered 2021-10-25: 100 mg via INTRAVENOUS

## 2021-10-25 MED ORDER — PROPOFOL 10 MG/ML IV BOLUS
INTRAVENOUS | Status: AC
  Filled 2021-10-25: qty 20

## 2021-10-25 MED ORDER — LACTATED RINGERS IV SOLN: INTRAVENOUS | Status: DC

## 2021-10-25 MED ORDER — CHLORHEXIDINE GLUCONATE 0.12 % MT SOLN
15.0000 mL | Freq: Once | OROMUCOSAL | Status: AC
  Administered 2021-10-25: 15 mL via OROMUCOSAL

## 2021-10-25 MED ORDER — PROPOFOL 10 MG/ML IV BOLUS
INTRAVENOUS | Status: DC | PRN
  Administered 2021-10-25: 70 mg via INTRAVENOUS

## 2021-10-25 MED ORDER — CHLORHEXIDINE GLUCONATE 0.12 % MT SOLN
OROMUCOSAL | Status: AC
  Filled 2021-10-25: qty 15

## 2021-10-25 MED ORDER — ORAL CARE MOUTH RINSE: 15.0000 mL | Freq: Once | OROMUCOSAL | Status: AC

## 2021-10-25 MED ORDER — BUPIVACAINE HCL (PF) 0.25 % IJ SOLN
INTRAMUSCULAR | Status: DC | PRN
  Administered 2021-10-25: 2 mL

## 2021-10-25 MED ORDER — DEXAMETHASONE SODIUM PHOSPHATE 10 MG/ML IJ SOLN
INTRAMUSCULAR | Status: DC | PRN
  Administered 2021-10-25: 5 mg via INTRAVENOUS

## 2021-10-25 MED ORDER — PHENYLEPHRINE 80 MCG/ML (10ML) SYRINGE FOR IV PUSH (FOR BLOOD PRESSURE SUPPORT)
PREFILLED_SYRINGE | INTRAVENOUS | Status: DC | PRN
Start: 2021-10-25 — End: 2021-10-25
  Administered 2021-10-25 (×2): 40 ug via INTRAVENOUS
  Administered 2021-10-25: 80 ug via INTRAVENOUS

## 2021-10-25 MED ORDER — CEFAZOLIN SODIUM 1 G IJ SOLR
INTRAMUSCULAR | Status: AC
  Filled 2021-10-25: qty 20

## 2021-10-25 MED ORDER — CEFAZOLIN SODIUM-DEXTROSE 2-3 GM-%(50ML) IV SOLR
INTRAVENOUS | Status: DC | PRN
  Administered 2021-10-25: 2 g via INTRAVENOUS

## 2021-10-25 MED ORDER — PHENYLEPHRINE HCL-NACL 20-0.9 MG/250ML-% IV SOLN
INTRAVENOUS | Status: DC | PRN
  Administered 2021-10-25: 25 ug/min via INTRAVENOUS

## 2021-10-25 MED ORDER — BUPIVACAINE HCL (PF) 0.25 % IJ SOLN
INTRAMUSCULAR | Status: AC
  Filled 2021-10-25: qty 30

## 2021-10-25 MED ORDER — ONDANSETRON HCL 4 MG/2ML IJ SOLN
INTRAMUSCULAR | Status: AC
  Filled 2021-10-25: qty 2

## 2021-10-25 MED ORDER — 0.9 % SODIUM CHLORIDE (POUR BTL) OPTIME
TOPICAL | Status: DC | PRN
  Administered 2021-10-25: 1000 mL

## 2021-10-25 SURGICAL SUPPLY — 49 items
ADH SKN CLS APL DERMABOND .7 (GAUZE/BANDAGES/DRESSINGS) ×1
APL SKNCLS STERI-STRIP NONHPOA (GAUZE/BANDAGES/DRESSINGS) ×1
BAG COUNTER SPONGE SURGICOUNT (BAG) ×2 IMPLANT
BAG SPNG CNTER NS LX DISP (BAG) ×1
BENZOIN TINCTURE PRP APPL 2/3 (GAUZE/BANDAGES/DRESSINGS) ×1 IMPLANT
BLADE CLIPPER SURG (BLADE) IMPLANT
BLADE SURG 15 STRL LF DISP TIS (BLADE) ×1 IMPLANT
BLADE SURG 15 STRL SS (BLADE) ×2
BNDG ADH 1X3 SHEER STRL LF (GAUZE/BANDAGES/DRESSINGS) ×4 IMPLANT
BNDG ADH THN 3X1 STRL LF (GAUZE/BANDAGES/DRESSINGS) ×2
CARTRIDGE OIL MAESTRO DRILL (MISCELLANEOUS) ×1 IMPLANT
CEMENT KYPHON C01A KIT/MIXER (Cement) ×2 IMPLANT
CLSR STERI-STRIP ANTIMIC 1/2X4 (GAUZE/BANDAGES/DRESSINGS) ×1 IMPLANT
DERMABOND ADVANCED (GAUZE/BANDAGES/DRESSINGS) ×1
DERMABOND ADVANCED .7 DNX12 (GAUZE/BANDAGES/DRESSINGS) IMPLANT
DEVICE BIOPSY BONE KYPHX (INSTRUMENTS) ×1 IMPLANT
DIFFUSER DRILL AIR PNEUMATIC (MISCELLANEOUS) ×1 IMPLANT
DRAPE C-ARM 42X72 X-RAY (DRAPES) ×2 IMPLANT
DRAPE HALF SHEET 40X57 (DRAPES) ×2 IMPLANT
DRAPE INCISE IOBAN 66X45 STRL (DRAPES) ×2 IMPLANT
DRAPE LAPAROTOMY 100X72X124 (DRAPES) ×2 IMPLANT
DRAPE WARM FLUID 44X44 (DRAPES) ×2 IMPLANT
DURAPREP 26ML APPLICATOR (WOUND CARE) ×2 IMPLANT
GAUZE 4X4 16PLY ~~LOC~~+RFID DBL (SPONGE) ×2 IMPLANT
GLOVE BIOGEL PI IND STRL 7.0 (GLOVE) IMPLANT
GLOVE BIOGEL PI INDICATOR 7.0 (GLOVE) ×4
GLOVE ECLIPSE 9.0 STRL (GLOVE) ×2 IMPLANT
GLOVE EXAM NITRILE XL STR (GLOVE) IMPLANT
GOWN STRL REUS W/ TWL LRG LVL3 (GOWN DISPOSABLE) ×1 IMPLANT
GOWN STRL REUS W/ TWL XL LVL3 (GOWN DISPOSABLE) IMPLANT
GOWN STRL REUS W/TWL 2XL LVL3 (GOWN DISPOSABLE) IMPLANT
GOWN STRL REUS W/TWL LRG LVL3 (GOWN DISPOSABLE) ×4
GOWN STRL REUS W/TWL XL LVL3 (GOWN DISPOSABLE) ×2
KIT BASIN OR (CUSTOM PROCEDURE TRAY) ×2 IMPLANT
KIT TURNOVER KIT B (KITS) ×2 IMPLANT
NDL HYPO 25X1 1.5 SAFETY (NEEDLE) ×1 IMPLANT
NEEDLE HYPO 25X1 1.5 SAFETY (NEEDLE) ×2 IMPLANT
NS IRRIG 1000ML POUR BTL (IV SOLUTION) ×2 IMPLANT
OIL CARTRIDGE MAESTRO DRILL (MISCELLANEOUS)
PACK EENT II TURBAN DRAPE (CUSTOM PROCEDURE TRAY) ×2 IMPLANT
PAD ARMBOARD 7.5X6 YLW CONV (MISCELLANEOUS) ×2 IMPLANT
STAPLER SKIN PROX WIDE 3.9 (STAPLE) ×1 IMPLANT
SUT VIC AB 2-0 CP2 18 (SUTURE) IMPLANT
SUT VIC AB 3-0 SH 8-18 (SUTURE) ×1 IMPLANT
SYR CONTROL 10ML LL (SYRINGE) ×2 IMPLANT
TOWEL GREEN STERILE (TOWEL DISPOSABLE) ×2 IMPLANT
TOWEL GREEN STERILE FF (TOWEL DISPOSABLE) ×2 IMPLANT
TRAY KYPHOPAK 15/3 ONESTEP 1ST (MISCELLANEOUS) ×1 IMPLANT
TRAY KYPHOPAK 20/3 ONESTEP 1ST (MISCELLANEOUS) IMPLANT

## 2021-10-25 NOTE — Discharge Instructions (Signed)

## 2021-10-25 NOTE — Transfer of Care (Signed)
Immediate Anesthesia Transfer of Care Note ? ?Patient: [name] ? ?Procedure(s) Performed: LUMBAR TWO KYPHOPLASTY (Spine Lumbar) ? ?Patient Location: PACU ? ?Anesthesia Type:General ? ?Level of Consciousness: awake, alert  and oriented ? ?Airway & Oxygen Therapy: Patient Spontanous Breathing ? ?Post-op Assessment: Report given to RN, Post -op Vital signs reviewed and stable and Patient moving all extremities X 4 ? ?Post vital signs: Reviewed and stable ? ?Last Vitals:  ?Vitals Value Taken Time  ?BP 175/66   ?Temp    ?Pulse 70 10/25/21 0908  ?Resp 15 10/25/21 0908  ?SpO2 98 % 10/25/21 0908  ?Vitals shown include unvalidated device data. ? ?Last Pain:  ?Vitals:  ? 10/25/21 0700  ?TempSrc:   ?PainSc: 0-No pain  ?   ? ?  ? ?Complications: No notable events documented. ?

## 2021-10-25 NOTE — Anesthesia Postprocedure Evaluation (Signed)
Anesthesia Post Note ? ?Patient: [name] ? ?Procedure(s) Performed: LUMBAR TWO KYPHOPLASTY (Spine Lumbar) ? ?  ? ?Patient location during evaluation: PACU ?Anesthesia Type: General ?Level of consciousness: sedated and patient cooperative ?Pain management: pain level controlled ?Vital Signs Assessment: post-procedure vital signs reviewed and stable ?Respiratory status: spontaneous breathing ?Cardiovascular status: stable ?Anesthetic complications: no ? ? ?No notable events documented. ? ?Last Vitals:  ?Vitals:  ? 10/25/21 0921 10/25/21 0936  ?BP: (!) 159/70 (!) 141/56  ?Pulse: 69 64  ?Resp: 20 15  ?Temp:  (!) 36.4 ?C  ?SpO2: 93% 93%  ?  ?Last Pain:  ?Vitals:  ? 10/25/21 0936  ?TempSrc:   ?PainSc: 0-No pain  ? ? ?  ?  ?  ?  ?  ?  ? ?Lewie Loron ? ? ? ? ?
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
## 2021-10-25 NOTE — Brief Op Note (Signed)
10/25/2021 ? ?8:53 AM ? ?PATIENT:  [name]  88 y.o. female ? ?PRE-OPERATIVE DIAGNOSIS:  Compression fracture lumbar ? ?POST-OPERATIVE DIAGNOSIS:  Compression fracture lumbar ? ?PROCEDURE:  Procedure(s): ?LUMBAR TWO KYPHOPLASTY (N/A) ? ?SURGEON:  Surgeon(s) and Role: ?   Earnie Larsson, MD - Primary ? ?PHYSICIAN ASSISTANT:  ? ?ASSISTANTS: none  ? ?ANESTHESIA:   general ? ?EBL:  minimal  ? ?BLOOD ADMINISTERED:none ? ?DRAINS: none  ? ?LOCAL MEDICATIONS USED:  LIDOCAINE  ? ?SPECIMEN:  No Specimen ? ?DISPOSITION OF SPECIMEN:  N/A ? ?COUNTS:  YES ? ?TOURNIQUET:  * No tourniquets in log * ? ?DICTATION: .Dragon Dictation ? ?PLAN OF CARE: Discharge to home after PACU ? ?PATIENT DISPOSITION:  PACU - hemodynamically stable. ?  ?Delay start of Pharmacological VTE agent (>24hrs) due to surgical blood loss or risk of bleeding: yes ? ?

## 2021-10-25 NOTE — Op Note (Signed)
Date of procedure: 10/25/2021 ? ?Date of dictation: Same ? ?Service: Neurosurgery ? ?Preoperative diagnosis: Osteoporotic L2 compression fracture ? ?Postoperative diagnosis: Same ? ?Procedure Name: L2 kyphoplasty ? ?Surgeon:Yaw Escoto A.Tatsuya Okray, M.D. ? ?Asst. Surgeon: None ? ?Anesthesia: General ? ?Indication: 88 year old female with intractable back pain.  Work-up demonstrates evidence of a L2 compression fracture with nonhealing.  Patient presents now for kyphoplasty in hopes of improving her symptoms. ? ?Operative note: After induction anesthesia, patient edition prone on the bolsters and appropriately padded.  Patient's lumbar region prepped and draped sterilely.  AP and lateral fluoroscopy was used for localization.  Incision was made overlying the left L2 pedicle.  Jamshidi needle introducer then passed through the lateral aspect of the pedicle and into the vertebral body under fluoroscopic guidance.  Trocar past midline.  Path for the balloon was then drilled.  The balloon was inserted and inflated reducing the fracture.  Methylmethacrylate was mixed.  6 cc of methylmethacrylate was then sequentially instilled into the vertebral body of L2 under fluoroscopic guidance.  Good integration throughout the L2 vertebral body was achieved.  No evidence of any significant extravasation.  The cement was allowed to harden.  The instruments were removed.  Hemostasis was excellent.  Wound was closed.  Steri-Strips and sterile dressing were applied.  No apparent complications. ? ?

## 2021-10-25 NOTE — Anesthesia Procedure Notes (Signed)
Procedure Name: Intubation ?Date/Time: 10/25/2021 8:16 AM ?Performed by: Harden Mo, CRNA ?Pre-anesthesia Checklist: Patient identified, Emergency Drugs available, Suction available and Patient being monitored ?Patient Re-evaluated:Patient Re-evaluated prior to induction ?Oxygen Delivery Method: Circle System Utilized ?Preoxygenation: Pre-oxygenation with 100% oxygen ?Induction Type: IV induction ?Ventilation: Mask ventilation without difficulty and Oral airway inserted - appropriate to patient size ?Laryngoscope Size: Sabra Heck and 2 ?Grade View: Grade I ?Tube type: Oral ?Tube size: 7.0 mm ?Number of attempts: 1 ?Airway Equipment and Method: Stylet and Oral airway ?Placement Confirmation: ETT inserted through vocal cords under direct vision, positive ETCO2 and breath sounds checked- equal and bilateral ?Secured at: 21 cm ?Tube secured with: Tape ?Dental Injury: Teeth and Oropharynx as per pre-operative assessment  ? ? ? ? ?

## 2021-10-26 DIAGNOSIS — S32020A Wedge compression fracture of second lumbar vertebra, initial encounter for closed fracture: Secondary | ICD-10-CM | POA: Insufficient documentation

## 2021-10-26 NOTE — Progress Notes (Signed)
?  Subjective:  ?Patient ID: [name], female    DOB: 1932-10-05,  MRN: [id number] ? ?[name] presents to clinic today for painful elongated mycotic toenails 1-5 bilaterally which are tender when wearing enclosed shoe gear. Pain is relieved with periodic professional debridement. ? ?Patient states she will be having back surgery on April 21,2023. Her daughter is present during today's visit. ? ?New problem(s): None.  ? ?PCP is Oletha Blend, MD , and last visit was August 09, 2021. ? ?Allergies  ?Allergen Reactions  ? Amoxicillin Other (See Comments)  ?  Patient tolerated Ancef in May and Zinacef in March 2017. ?Did it involve swelling of the face/tongue/throat, SOB, or low BP? Unknown ?Did it involve sudden or severe rash/hives, skin peeling, or any reaction on the inside of your mouth or nose? Unknown ?Did you need to seek medical attention at a hospital or doctor's office? Unknown ?When did it last happen?      unk ?If all above answers are ?NO?, may proceed with cephalosporin use. ?  ? Zetia [Ezetimibe] Other (See Comments)  ?  DIZZINESS  ? ? ?Review of Systems: Negative except as noted in the HPI. ? ?Objective: No changes noted in today's physical examination. ? ?[name] is a pleasant 88 y.o. female WD, WN in NAD. AAO x 3. ? ?There were no vitals filed for this visit. ? ?Vascular Examination:  ?Capillary refill time to digits <4 seconds b/l lower extremities. Palpable PT pulse(s) b/l lower extremities Faintly palpable DP pulse(s) b/l lower extremities. Pedal hair absent. Lower extremity skin temperature gradient within normal limits. No pain with calf compression b/l. Trace edema noted b/l lower extremities. ? ?Dermatological Examination: ?Pedal skin is thin shiny, atrophic b/l lower extremities. Skin warm and supple b/l lower extremities. No open wounds b/l lower extremities. No interdigital macerations b/l lower extremities. Toenails 1-5 b/l elongated, discolored, dystrophic, thickened,  crumbly with subungual debris and tenderness to dorsal palpation. ? ?Musculoskeletal: ?Normal muscle strength 5/5 to all lower extremity muscle groups bilaterally. No pain crepitus or joint limitation noted with ROM b/l lower extremities. No gross bony deformities b/l lower extremities. ? ?Neurological: ?Protective sensation intact 5/5 intact bilaterally with 10g monofilament b/l. Vibratory sensation diminished b/l. ? ?Assessment/Plan: ?1. Pain due to onychomycosis of toenails of both feet   ?  ?-Patient was evaluated and treated. All patient's and/or POA's questions/concerns answered on today's visit. ?-Mycotic toenails 1-5 bilaterally were debrided in length and girth with sterile nail nippers and dremel without incident. ?-Patient/POA to call should there be question/concern in the interim.  ? ?Return in about 3 months (around 01/16/2022). ? ?[name], DPM  ?

## 2021-10-28 ENCOUNTER — Telehealth: Payer: Self-pay | Admitting: Cardiology

## 2021-10-28 ENCOUNTER — Encounter (HOSPITAL_COMMUNITY): Payer: Self-pay | Admitting: Neurosurgery

## 2021-10-28 MED ORDER — NITROGLYCERIN 0.4 MG SL SUBL
0.4000 mg | SUBLINGUAL_TABLET | SUBLINGUAL | 5 refills | Status: AC | PRN
Start: 2021-10-28 — End: ?

## 2021-10-28 NOTE — Telephone Encounter (Signed)
?  Daughter is calling because she says the patient is having a pain down her left arm. She denies any chest pain or any other symptoms. Daughter states this happened before when she had a blockage.  ?

## 2021-10-28 NOTE — Telephone Encounter (Signed)
Spoke with pt regarding pain that she feels down her arm that goes into her neck. Pt states this is similar to what she felt prior to having her bypass surgery. Pt states that this has been going on for about 2-3 months now. The pain can awaken her at night from sleep. Pt states that she uses heat and tylenol to make the pain better. PT states she is usually up with discomfort for at least an hour before she can go back to sleep. Pt would like to be seen in the office. Pt states I will need to call her daughter to set this appointment up. ? ?Called pt's daughter Dondra Spry to set up appointment. Able to schedule appointment for pt to see Dr. Antoine Poche on 10/31/21 at 11:30am. Daughter verbalizes understanding.  ? ?
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
## 2021-10-31 ENCOUNTER — Ambulatory Visit: Payer: Medicare HMO | Admitting: Cardiology

## 2021-10-31 ENCOUNTER — Encounter: Payer: Self-pay | Admitting: Cardiology

## 2021-10-31 VITALS — BP 160/80 | HR 81 | Ht 60.0 in | Wt 109.4 lb

## 2021-10-31 DIAGNOSIS — I4891 Unspecified atrial fibrillation: Secondary | ICD-10-CM

## 2021-10-31 DIAGNOSIS — M79602 Pain in left arm: Secondary | ICD-10-CM

## 2021-10-31 DIAGNOSIS — M79603 Pain in arm, unspecified: Secondary | ICD-10-CM

## 2021-10-31 DIAGNOSIS — I2510 Atherosclerotic heart disease of native coronary artery without angina pectoris: Secondary | ICD-10-CM | POA: Diagnosis not present

## 2021-10-31 MED ORDER — ISOSORBIDE MONONITRATE ER 60 MG PO TB24: 60.0000 mg | ORAL_TABLET | Freq: Every day | ORAL | 3 refills | Status: DC

## 2021-10-31 NOTE — Patient Instructions (Signed)
Medication Instructions:  ?START: Imdur 60 mg daily ? ?*If you need a refill on your cardiac medications before your next appointment, please call your pharmacy* ? ? ?Lab Work: ?None ordered today ? ? ?Testing/Procedures: ?Your physician has requested that you have a lexiscan myoview. For further information please visit [email or web address] Please follow instruction sheet, as given.  ?[address] ? ?Follow-Up: ?At [facility], you and your health needs are our priority.  As part of our continuing mission to provide you with exceptional heart care, we have created designated Provider Care Teams.  These Care Teams include your primary Cardiologist (physician) and Advanced Practice Providers (APPs -  Physician Assistants and Nurse Practitioners) who all work together to provide you with the care you need, when you need it. ? ?We recommend signing up for the patient portal called "MyChart".  Sign up information is provided on this After Visit Summary.  MyChart is used to connect with patients for Virtual Visits (Telemedicine).  Patients are able to view lab/test results, encounter notes, upcoming appointments, etc.  Non-urgent messages can be sent to your provider as well.   ?To learn more about what you can do with MyChart, go to [email or web address].   ? ?Your next appointment:   ?6 month(s) ? ?The format for your next appointment:   ?In Person ? ?Provider:   ?APP ? ?You are scheduled for a Myocardial Perfusion Imaging Study.  Please arrive 15 minutes prior to your appointment time for registration and insurance purposes. ? ?The test will take approximately 3 to 4 hours to complete; you may bring reading material.  If someone comes with you to your appointment, they will need to remain in the main lobby due to limited space in the testing area. **If you are pregnant or breastfeeding, please notify the nuclear lab prior to your appointment** ? ?How to prepare for your Myocardial Perfusion  Test: ?Do not eat or drink 3 hours prior to your test, except you may have water. ?Do not consume products containing caffeine (regular or decaffeinated) 12 hours prior to your test. (ex: coffee, chocolate, sodas, tea). ?Do bring a list of your current medications with you.  If not listed below, you may take your medications as normal. ?Do wear comfortable clothes (no dresses or overalls) and walking shoes, tennis shoes preferred (No heels or open toe shoes are allowed). ?Do NOT wear cologne, perfume, aftershave, or lotions (deodorant is allowed). ?If these instructions are not followed, your test will have to be rescheduled. ? ?Please report to [address], [address] for your test.  If you have questions or concerns about your appointment, you can call the Nuclear Lab at [phone]. ? ?If you cannot keep your appointment, please provide 24 hours notification to the Nuclear Lab, to avoid a possible $50 charge to your account.  ? ? ?Important Information About Sugar ? ? ? ? ? ? ?

## 2021-11-06 ENCOUNTER — Telehealth (HOSPITAL_COMMUNITY): Payer: Self-pay | Admitting: Cardiology

## 2021-11-06 NOTE — Telephone Encounter (Signed)
Patient cancelled Myoview scheduled for 11/07/21 for reason below: ? ?11/06/21 Patient does not want to have the test. Pt cancelled due to test take stoo long/LBW 12:14 ? ?Order will be removed from the [facility].  ?

## 2021-11-06 NOTE — Telephone Encounter (Signed)
Returned call to patient to see why she would like to cancel her stress test. Patient states that she has other appointments and will call back to schedule at a later time.  ? ?Patient also wanted to let Dr. Antoine Poche know that the Imdur she started has relieved her left arm pain and she feels good at present.  ? ?Will forward to MD.  ?

## 2021-11-07 ENCOUNTER — Ambulatory Visit (HOSPITAL_COMMUNITY)
Admission: RE | Admit: 2021-11-07 | Payer: Medicare HMO | Source: Ambulatory Visit | Attending: Cardiology | Admitting: Cardiology

## 2021-11-13 ENCOUNTER — Telehealth (HOSPITAL_COMMUNITY): Payer: Self-pay | Admitting: *Deleted

## 2021-11-13 NOTE — Telephone Encounter (Signed)
Close encounter 

## 2021-11-14 ENCOUNTER — Ambulatory Visit (HOSPITAL_COMMUNITY)
Admission: RE | Admit: 2021-11-14 | Discharge: 2021-11-14 | Disposition: A | Discharge status: Home / Self Care | Payer: Medicare HMO | Source: Ambulatory Visit | Attending: Cardiovascular Disease | Admitting: Cardiovascular Disease

## 2021-11-14 DIAGNOSIS — I2510 Atherosclerotic heart disease of native coronary artery without angina pectoris: Secondary | ICD-10-CM | POA: Diagnosis not present

## 2021-11-14 LAB — MYOCARDIAL PERFUSION IMAGING
Peak HR: 86 {beats}/min
Rest HR: 78 {beats}/min
Rest Nuclear Isotope Dose: 11 mCi
SDS: 3
SRS: 3
SSS: 6
Stress Nuclear Isotope Dose: 31.9 mCi
TID: 1.17

## 2021-11-14 MED ORDER — TECHNETIUM TC 99M TETROFOSMIN IV KIT
11.0000 | PACK | Freq: Once | INTRAVENOUS | Status: AC | PRN
  Administered 2021-11-14: 11 via INTRAVENOUS
  Filled 2021-11-14: qty 11

## 2021-11-14 MED ORDER — REGADENOSON 0.4 MG/5ML IV SOLN
0.4000 mg | Freq: Once | INTRAVENOUS | Status: AC
  Administered 2021-11-14: 0.4 mg via INTRAVENOUS

## 2021-11-14 MED ORDER — TECHNETIUM TC 99M TETROFOSMIN IV KIT
31.9000 | PACK | Freq: Once | INTRAVENOUS | Status: AC | PRN
  Administered 2021-11-14: 31.9 via INTRAVENOUS
  Filled 2021-11-14: qty 32

## 2021-11-14 MED ORDER — AMINOPHYLLINE 25 MG/ML IV SOLN
75.0000 mg | Freq: Once | INTRAVENOUS | Status: AC
  Administered 2021-11-14: 75 mg via INTRAVENOUS

## 2021-11-18 ENCOUNTER — Encounter: Payer: Self-pay | Admitting: *Deleted

## 2021-11-20 ENCOUNTER — Telehealth: Payer: Self-pay | Admitting: Cardiology

## 2021-11-20 NOTE — Telephone Encounter (Signed)
Pt is requesting call back to discuss results on procedure done on 5/11.  ?

## 2021-11-20 NOTE — Telephone Encounter (Signed)
Patient advised of Lexiscan results per Dr. Antoine Poche...Marland Kitchen"Low risk study.  No change in therapy.  No further testing." Informed patient that she will receive the results via [place] mail. Pt had no questions or concerns at this time. ?

## 2021-12-12 ENCOUNTER — Other Ambulatory Visit: Payer: Self-pay

## 2021-12-12 ENCOUNTER — Encounter (HOSPITAL_COMMUNITY): Payer: Self-pay | Admitting: Neurosurgery

## 2021-12-12 NOTE — Progress Notes (Addendum)
Anesthesia Chart Review: Same day workup  Follows cardiology for history of CAD s/p CABG 2009 with concomitant tissue AVR and subsequent TAVR 09/2014, paroxysmal atrial fibrillation (not on anticoagulation due to history of anemia), 4.8 cm ascending aortic aneurysm (not actively being followed as patient is not felt to be a candidate for redo surgery), left bundle branch block, carotid stenosis (bilateral 40-59% ICA stenosis by duplex 05/2021).  Seen by Dr. Percival Spanish 10/31/2021 and at that time was complaining of some nocturnal left arm pain.  Per note, "Shoulder pain: This seems to only happen at night which makes it less likely to be vascular.  I am going to go ahead and start her on Imdur 60 mg daily to see if there is any improvement.  I am going to bring her back for a perfusion study.  However, my pretest suspicion that this is vascular is low."  Subsequent nuclear stress test 11/14/2021 was low risk, nonischemic.  Dr. Percival Spanish commented on results and stated no change to therapy, no further testing.   Follows with pulmonology at [facility] for history of bronchiectasis.  Treated for exacerbation on 07/03/2021.  At last follow-up on 07/24/2021 she was noted to be doing well, stable cough.  She was advised to follow-up in 1 year.   Recently underwent L2 kyphoplasty [id number] without complication.  Patient will need day of surgery labs and evaluation.   EKG 10/31/2021: Sinus rhythm with PACs.  Rate 81.  LAD.  Left bundle branch block.   CHEST - 2 VIEW 03/14/21 (Care Everywhere): COMPARISON:  Portable chest 12/08/2020 and earlier.    FINDINGS:  Chronic TAVR and CABG. Cardiomegaly appears mildly regressed since  last year. Calcified aortic atherosclerosis. Other mediastinal  contours are within normal limits.    Larger lung volumes. Visualized tracheal air column is within normal  limits. No pneumothorax, pulmonary edema, pleural effusion or  confluent pulmonary opacity. Chronic bronchiectasis and lung   scarring demonstrated by CT last year is less apparent.    No acute osseous abnormality identified. Previous anterior and  posterior cervical spine fusion. Stable cholecystectomy clips.  Negative visible bowel gas pattern.    IMPRESSION:  1. Chronic lung disease. Improved ventilation since [place] with no  acute pulmonary abnormality.  2. Chronic cardiomegaly, Aortic Atherosclerosis (ICD10-I70.0).     Nuclear stress 11/14/2021:   Lexiscan stress is electrcally nondiagnositic for ischemia   Myoview scan shows small mid anterolateral defect that improves in rest images consistent with small region of ischemia though cannot exclude shifting breast  Otherwise normal perfusion   Images not gated due to PVCs   Overall probable low risk scan   TTE 09/19/21:  1. Left ventricular ejection fraction, by estimation, is 60 to 65%. The  left ventricle has normal function. The left ventricle has no regional  wall motion abnormalities. There is moderate left ventricular hypertrophy.  Left ventricular diastolic function   could not be evaluated. The average left ventricular global longitudinal  strain is -18.7 %. The global longitudinal strain is normal.   2. Right ventricular systolic function is mildly reduced. The right  ventricular size is normal. There is moderately elevated pulmonary artery  systolic pressure. The estimated right ventricular systolic pressure is  0000000 mmHg.   3. Left atrial size was moderately dilated.   4. Right atrial size was mildly dilated.   5. The mitral valve is abnormal. Mild mitral valve regurgitation. Mild  mitral stenosis. The mean mitral valve gradient is 6.0 mmHg. Severe mitral  annular  calcification.   6. Tricuspid valve regurgitation is mild to moderate.   7. The aortic valve has been repaired/replaced. Aortic valve  regurgitation is not visualized. There is a 20 mm Sapien prosthetic (TAVR)  valve present in the aortic position. Procedure Date: 2016. Echo  findings  are consistent with normal structure and  function of the aortic valve prosthesis. Aortic valve mean gradient  measures 19.0 mmHg.   8. Aortic dilatation noted. There is mild dilatation of the ascending  aorta, measuring 44 mm.   9. The inferior vena cava is normal in size with greater than 50%  respiratory variability, suggesting right atrial pressure of 3 mmHg.   Comparison(s): No significant change from prior study. 09/19/20 EF 60-65%.  Mild MS 27mmHg mean PG. AV 54mmHg mean PG, 29mmHg peak PG.    Carotid duplex 05/23/21: Summary:  Right Carotid: Velocities in the right ICA are consistent with a 40-59%                 stenosis.   Left Carotid: Velocities in the left ICA are consistent with a 40-59%  stenosis.   Vertebrals:  Bilateral vertebral arteries demonstrate antegrade flow.  Subclavians: Normal flow hemodynamics were seen in bilateral subclavian               arteries.    Wynonia Musty [facility] Short Stay Center/Anesthesiology Phone [phone] 12/12/2021 11:35 AM

## 2021-12-12 NOTE — Anesthesia Preprocedure Evaluation (Addendum)
Anesthesia Evaluation  Patient identified by MRN, date of birth, ID band Patient awake    Reviewed: Allergy & Precautions, NPO status , Patient's Chart, lab work & pertinent test results  History of Anesthesia Complications (+) PONV and history of anesthetic complications  Airway Mallampati: I  TM Distance: <3 FB Neck ROM: Full    Dental  (+) Edentulous Upper, Edentulous Lower   Pulmonary pneumonia,    breath sounds clear to auscultation       Cardiovascular hypertension, Pt. on medications + CAD, + CABG and +CHF  + dysrhythmias + Valvular Problems/Murmurs AS  Rhythm:Regular Rate:Normal + Systolic Click Echo: 1. Left ventricular ejection fraction, by estimation, is 60 to 65%. The  left ventricle has normal function. The left ventricle has no regional  wall motion abnormalities. There is moderate left ventricular hypertrophy.  Left ventricular diastolic function  could not be evaluated. The average left ventricular global longitudinal  strain is -18.7 %. The global longitudinal strain is normal.  2. Right ventricular systolic function is mildly reduced. The right  ventricular size is normal. There is moderately elevated pulmonary artery  systolic pressure. The estimated right ventricular systolic pressure is  0000000 mmHg.  3. Left atrial size was moderately dilated.  4. Right atrial size was mildly dilated.  5. The mitral valve is abnormal. Mild mitral valve regurgitation. Mild  mitral stenosis. The mean mitral valve gradient is 6.0 mmHg. Severe mitral  annular calcification.  6. Tricuspid valve regurgitation is mild to moderate.  7. The aortic valve has been repaired/replaced. Aortic valve  regurgitation is not visualized. There is a 20 mm Sapien prosthetic (TAVR)  valve present in the aortic position. Procedure Date: 2016. Echo findings  are consistent with normal structure and  function of the aortic valve  prosthesis. Aortic valve mean gradient  measures 19.0 mmHg.  8. Aortic dilatation noted. There is mild dilatation of the ascending  aorta, measuring 44 mm.  9. The inferior vena cava is normal in size with greater than 50%  respiratory variability, suggesting right atrial pressure of 3 mmHg.    Neuro/Psych    GI/Hepatic Neg liver ROS, hiatal hernia, GERD  Medicated,  Endo/Other  negative endocrine ROS  Renal/GU Renal disease     Musculoskeletal  (+) Arthritis ,   Abdominal Normal abdominal exam  (+)   Peds  Hematology negative hematology ROS (+)   Anesthesia Other Findings   Reproductive/Obstetrics                           Anesthesia Physical Anesthesia Plan  ASA: 3  Anesthesia Plan: General   Post-op Pain Management:    Induction: Intravenous  PONV Risk Score and Plan: 4 or greater and Ondansetron and Treatment may vary due to age or medical condition  Airway Management Planned: Oral ETT  Additional Equipment: None  Intra-op Plan:   Post-operative Plan: Extubation in OR  Informed Consent: I have reviewed the patients History and Physical, chart, labs and discussed the procedure including the risks, benefits and alternatives for the proposed anesthesia with the patient or authorized representative who has indicated his/her understanding and acceptance.       Plan Discussed with: CRNA  Anesthesia Plan Comments: (PAT note by Karoline Caldwell, PA-C: [facility] for history of CAD s/p CABG 2009 with concomitant tissue AVR and subsequent TAVR 09/2014, paroxysmal atrial fibrillation (not on anticoagulation due to history of anemia), 4.8 cm ascending aortic aneurysm (not actively  being followed as patient is not felt to be a candidate for redo surgery), left bundle branch block, carotid stenosis (bilateral 40-59% ICA stenosis by duplex 05/2021). Seen by Dr. Percival Spanish 10/31/2021 and at that time was complaining of some nocturnal left arm  pain.  Per note, "Shoulder pain: This seems to only happen at night which makes it less likely to be vascular. I am going to go ahead and start her on Imdur 60 mg daily to see if there is any improvement. I am going to bring her back for a perfusion study. However, my pretest suspicion that this is vascular is low."  Subsequent nuclear stress test 11/14/2021 was low risk, nonischemic.  Dr. Percival Spanish commented on results and stated no change to therapy, no further testing.  Follows with pulmonology at [facility] for history of bronchiectasis. Treated for exacerbation on 07/03/2021. At last follow-up on 07/24/2021 she was noted to be doing well, stable cough. She was advised to follow-up in 1 year.  Recently underwent L2 kyphoplasty [id number] without complication.  Patient will need day of surgery labs and evaluation.  EKG 10/31/2021: Sinus rhythm with PACs.  Rate 81.  LAD.  Left bundle branch block.  CHEST - 2 VIEW9/8/22 (Care Everywhere): COMPARISON: Portable chest 12/08/2020 and earlier.   FINDINGS:  Chronic TAVR and CABG. Cardiomegaly appears mildly regressed since  last year. Calcified aortic atherosclerosis. Other mediastinal  contours are within normal limits.   Larger lung volumes. Visualized tracheal air column is within normal  limits. No pneumothorax, pulmonary edema, pleural effusion or  confluent pulmonary opacity.Chronic bronchiectasis and lung  scarring demonstrated by CT last year is less apparent.   No acute osseous abnormality identified. Previous anterior and  posterior cervical spine fusion. Stable cholecystectomy clips.  Negative visible bowel gas pattern.   IMPRESSION:  1. Chronic lung disease. Improved ventilation since [place] with no  acute pulmonary abnormality.  2. Chronic cardiomegaly, Aortic Atherosclerosis (ICD10-I70.0).  Nuclear stress 11/14/2021: . Carlton Adam stress is electrcally nondiagnositic for ischemia . Myoview scan shows small mid  anterolateral defect that improves in rest images consistent with small region of ischemia though cannot exclude shifting breast Otherwise normal perfusion . Images not gated due to PVCs . Overall probable low risk scan  TTE 09/19/21: 1. Left ventricular ejection fraction, by estimation, is 60 to 65%. The  left ventricle has normal function. The left ventricle has no regional  wall motion abnormalities. There is moderate left ventricular hypertrophy.  Left ventricular diastolic function  could not be evaluated. The average left ventricular global longitudinal  strain is -18.7 %. The global longitudinal strain is normal.  2. Right ventricular systolic function is mildly reduced. The right  ventricular size is normal. There is moderately elevated pulmonary artery  systolic pressure. The estimated right ventricular systolic pressure is  0000000 mmHg.  3. Left atrial size was moderately dilated.  4. Right atrial size was mildly dilated.  5. The mitral valve is abnormal. Mild mitral valve regurgitation. Mild  mitral stenosis. The mean mitral valve gradient is 6.0 mmHg. Severe mitral  annular calcification.  6. Tricuspid valve regurgitation is mild to moderate.  7. The aortic valve has been repaired/replaced. Aortic valve  regurgitation is not visualized. There is a 20 mm Sapien prosthetic (TAVR)  valve present in the aortic position. Procedure Date: 2016. Echo findings  are consistent with normal structure and  function of the aortic valve prosthesis. Aortic valve mean gradient  measures 19.0 mmHg.  8. Aortic dilatation noted. There is  mild dilatation of the ascending  aorta, measuring 44 mm.  9. The inferior vena cava is normal in size with greater than 50%  respiratory variability, suggesting right atrial pressure of 3 mmHg.   Comparison(s): No significant change from prior study. 09/19/20 EF 60-65%.  Mild MS 38mmHg mean PG. AV 28mmHg mean PG, 39mmHg peak PG.   Carotid duplex  05/23/21: Summary:  Right Carotid: Velocities in the right ICA are consistent with a 40-59%         stenosis.   Left Carotid: Velocities in the left ICA are consistent with a 40-59%  stenosis.   Vertebrals: Bilateral vertebral arteries demonstrate antegrade flow.  Subclavians: Normal flow hemodynamics were seen in bilateral subclavian        arteries.  )      Anesthesia Quick Evaluation

## 2021-12-12 NOTE — Progress Notes (Signed)
Ms. [name] denies chest pain or shortness of breath. Patient denies having any s/s of Covid in her household.  Patient denies any known exposure to Covid.   Mrs [name]'s Drs: Cardiologist: Dr. Patrica Duel,  PCP: Marcellus Scott.  Mrs [name] is on ASA, patient cannot remember when stop taking it. Patient said she has not stoop taken it since 12/05/21 or 12/06/21

## 2021-12-13 ENCOUNTER — Encounter (HOSPITAL_COMMUNITY)
Admission: RE | Disposition: A | Discharge status: Home / Self Care | Payer: Self-pay | Source: Home / Self Care | Attending: Neurosurgery

## 2021-12-13 ENCOUNTER — Other Ambulatory Visit: Payer: Self-pay

## 2021-12-13 ENCOUNTER — Encounter (HOSPITAL_COMMUNITY): Payer: Self-pay | Admitting: Neurosurgery

## 2021-12-13 ENCOUNTER — Ambulatory Visit (HOSPITAL_BASED_OUTPATIENT_CLINIC_OR_DEPARTMENT_OTHER): Payer: Medicare HMO | Admitting: Physician Assistant

## 2021-12-13 ENCOUNTER — Ambulatory Visit (HOSPITAL_COMMUNITY): Payer: Medicare HMO | Admitting: Physician Assistant

## 2021-12-13 ENCOUNTER — Ambulatory Visit (HOSPITAL_COMMUNITY): Payer: Medicare HMO

## 2021-12-13 ENCOUNTER — Ambulatory Visit (HOSPITAL_COMMUNITY)
Admission: RE | Admit: 2021-12-13 | Discharge: 2021-12-13 | Disposition: A | Discharge status: Home / Self Care | Payer: Medicare HMO | Attending: Neurosurgery | Admitting: Neurosurgery

## 2021-12-13 DIAGNOSIS — I509 Heart failure, unspecified: Secondary | ICD-10-CM | POA: Diagnosis not present

## 2021-12-13 DIAGNOSIS — Z951 Presence of aortocoronary bypass graft: Secondary | ICD-10-CM | POA: Insufficient documentation

## 2021-12-13 DIAGNOSIS — I110 Hypertensive heart disease with heart failure: Secondary | ICD-10-CM | POA: Diagnosis not present

## 2021-12-13 DIAGNOSIS — I081 Rheumatic disorders of both mitral and tricuspid valves: Secondary | ICD-10-CM | POA: Insufficient documentation

## 2021-12-13 DIAGNOSIS — I2510 Atherosclerotic heart disease of native coronary artery without angina pectoris: Secondary | ICD-10-CM

## 2021-12-13 DIAGNOSIS — K219 Gastro-esophageal reflux disease without esophagitis: Secondary | ICD-10-CM | POA: Insufficient documentation

## 2021-12-13 DIAGNOSIS — M1990 Unspecified osteoarthritis, unspecified site: Secondary | ICD-10-CM | POA: Diagnosis not present

## 2021-12-13 DIAGNOSIS — M8088XA Other osteoporosis with current pathological fracture, vertebra(e), initial encounter for fracture: Secondary | ICD-10-CM | POA: Diagnosis not present

## 2021-12-13 DIAGNOSIS — M8008XA Age-related osteoporosis with current pathological fracture, vertebra(e), initial encounter for fracture: Secondary | ICD-10-CM | POA: Diagnosis present

## 2021-12-13 DIAGNOSIS — Z79899 Other long term (current) drug therapy: Secondary | ICD-10-CM | POA: Insufficient documentation

## 2021-12-13 DIAGNOSIS — I5020 Unspecified systolic (congestive) heart failure: Secondary | ICD-10-CM | POA: Diagnosis not present

## 2021-12-13 HISTORY — PX: KYPHOPLASTY: SHX5884

## 2021-12-13 LAB — BASIC METABOLIC PANEL
Anion gap: 11 (ref 5–15)
BUN: 16 mg/dL (ref 8–23)
CO2: 20 mmol/L — ABNORMAL LOW (ref 22–32)
Calcium: 9.9 mg/dL (ref 8.9–10.3)
Chloride: 107 mmol/L (ref 98–111)
Creatinine, Ser: 1.08 mg/dL — ABNORMAL HIGH (ref 0.44–1.00)
GFR, Estimated: 50 mL/min — ABNORMAL LOW (ref >60)
Glucose, Bld: 96 mg/dL (ref 70–99)
Potassium: 4.3 mmol/L (ref 3.5–5.1)
Sodium: 138 mmol/L (ref 135–145)

## 2021-12-13 LAB — SURGICAL PCR SCREEN
MRSA, PCR: NEGATIVE
Staphylococcus aureus: NEGATIVE

## 2021-12-13 LAB — CBC
HCT: 37.9 % (ref 36.0–46.0)
Hemoglobin: 12 g/dL (ref 12.0–15.0)
MCH: 30.5 pg (ref 26.0–34.0)
MCHC: 31.7 g/dL (ref 30.0–36.0)
MCV: 96.4 fL (ref 80.0–100.0)
Platelets: 223 10*3/uL (ref 150–400)
RBC: 3.93 MIL/uL (ref 3.87–5.11)
RDW: 14.5 % (ref 11.5–15.5)
WBC: 11.5 10*3/uL — ABNORMAL HIGH (ref 4.0–10.5)
nRBC: 0 % (ref 0.0–0.2)

## 2021-12-13 SURGERY — KYPHOPLASTY
Anesthesia: General | Site: Back

## 2021-12-13 MED ORDER — TRANEXAMIC ACID-NACL 1000-0.7 MG/100ML-% IV SOLN
1000.0000 mg | INTRAVENOUS | Status: DC
  Filled 2021-12-13: qty 100

## 2021-12-13 MED ORDER — VANCOMYCIN HCL IN DEXTROSE 1-5 GM/200ML-% IV SOLN
1000.0000 mg | Freq: Once | INTRAVENOUS | Status: AC
  Administered 2021-12-13: 1000 mg via INTRAVENOUS
  Filled 2021-12-13: qty 200

## 2021-12-13 MED ORDER — BUPIVACAINE HCL (PF) 0.25 % IJ SOLN
INTRAMUSCULAR | Status: DC | PRN
  Administered 2021-12-13: 10 mL

## 2021-12-13 MED ORDER — CHLORHEXIDINE GLUCONATE 0.12 % MT SOLN
15.0000 mL | Freq: Once | OROMUCOSAL | Status: AC
Start: 2021-12-13 — End: 2021-12-13
  Administered 2021-12-13: 15 mL via OROMUCOSAL
  Filled 2021-12-13: qty 15

## 2021-12-13 MED ORDER — IOPAMIDOL (ISOVUE-300) INJECTION 61%
INTRAVENOUS | Status: DC | PRN
  Administered 2021-12-13: 100 mL

## 2021-12-13 MED ORDER — DEXAMETHASONE SODIUM PHOSPHATE 10 MG/ML IJ SOLN
INTRAMUSCULAR | Status: AC
  Filled 2021-12-13: qty 1

## 2021-12-13 MED ORDER — ONDANSETRON HCL 4 MG/2ML IJ SOLN
INTRAMUSCULAR | Status: DC | PRN
  Administered 2021-12-13: 4 mg via INTRAVENOUS

## 2021-12-13 MED ORDER — ROCURONIUM BROMIDE 10 MG/ML (PF) SYRINGE
PREFILLED_SYRINGE | INTRAVENOUS | Status: AC
  Filled 2021-12-13: qty 10

## 2021-12-13 MED ORDER — PROPOFOL 10 MG/ML IV BOLUS
INTRAVENOUS | Status: AC
  Filled 2021-12-13: qty 20

## 2021-12-13 MED ORDER — PHENYLEPHRINE HCL-NACL 20-0.9 MG/250ML-% IV SOLN
INTRAVENOUS | Status: DC | PRN
  Administered 2021-12-13: 30 ug/min via INTRAVENOUS

## 2021-12-13 MED ORDER — PHENYLEPHRINE 80 MCG/ML (10ML) SYRINGE FOR IV PUSH (FOR BLOOD PRESSURE SUPPORT)
PREFILLED_SYRINGE | INTRAVENOUS | Status: AC
Start: 2021-12-13 — End: ?
  Filled 2021-12-13: qty 10

## 2021-12-13 MED ORDER — LIDOCAINE 2% (20 MG/ML) 5 ML SYRINGE
INTRAMUSCULAR | Status: AC
Start: 2021-12-13 — End: ?
  Filled 2021-12-13: qty 5

## 2021-12-13 MED ORDER — ROCURONIUM BROMIDE 10 MG/ML (PF) SYRINGE
PREFILLED_SYRINGE | INTRAVENOUS | Status: DC | PRN
  Administered 2021-12-13: 50 mg via INTRAVENOUS

## 2021-12-13 MED ORDER — DEXAMETHASONE SODIUM PHOSPHATE 10 MG/ML IJ SOLN
INTRAMUSCULAR | Status: DC | PRN
  Administered 2021-12-13: 5 mg via INTRAVENOUS

## 2021-12-13 MED ORDER — ORAL CARE MOUTH RINSE: 15.0000 mL | Freq: Once | OROMUCOSAL | Status: AC

## 2021-12-13 MED ORDER — LACTATED RINGERS IV SOLN: INTRAVENOUS | Status: DC

## 2021-12-13 MED ORDER — SUGAMMADEX SODIUM 200 MG/2ML IV SOLN
INTRAVENOUS | Status: DC | PRN
  Administered 2021-12-13 (×2): 100 mg via INTRAVENOUS

## 2021-12-13 MED ORDER — FENTANYL CITRATE (PF) 250 MCG/5ML IJ SOLN
INTRAMUSCULAR | Status: DC | PRN
Start: 2021-12-13 — End: 2021-12-13
  Administered 2021-12-13: 50 ug via INTRAVENOUS

## 2021-12-13 MED ORDER — 0.9 % SODIUM CHLORIDE (POUR BTL) OPTIME
TOPICAL | Status: DC | PRN
  Administered 2021-12-13: 1000 mL

## 2021-12-13 MED ORDER — LIDOCAINE 2% (20 MG/ML) 5 ML SYRINGE
INTRAMUSCULAR | Status: DC | PRN
  Administered 2021-12-13: 40 mg via INTRAVENOUS

## 2021-12-13 MED ORDER — PROPOFOL 10 MG/ML IV BOLUS
INTRAVENOUS | Status: DC | PRN
  Administered 2021-12-13: 60 mg via INTRAVENOUS

## 2021-12-13 MED ORDER — BUPIVACAINE HCL (PF) 0.25 % IJ SOLN
INTRAMUSCULAR | Status: AC
  Filled 2021-12-13: qty 30

## 2021-12-13 MED ORDER — FENTANYL CITRATE (PF) 250 MCG/5ML IJ SOLN
INTRAMUSCULAR | Status: AC
  Filled 2021-12-13: qty 5

## 2021-12-13 MED ORDER — LABETALOL HCL 5 MG/ML IV SOLN
INTRAVENOUS | Status: DC | PRN
  Administered 2021-12-13: 5 mg via INTRAVENOUS

## 2021-12-13 MED ORDER — ONDANSETRON HCL 4 MG/2ML IJ SOLN
INTRAMUSCULAR | Status: AC
  Filled 2021-12-13: qty 2

## 2021-12-13 SURGICAL SUPPLY — 45 items
ADH SKN CLS APL DERMABOND .7 (GAUZE/BANDAGES/DRESSINGS) ×1
BAG COUNTER SPONGE SURGICOUNT (BAG) ×2 IMPLANT
BAG SPNG CNTER NS LX DISP (BAG) ×1
BLADE CLIPPER SURG (BLADE) IMPLANT
BLADE SURG 15 STRL LF DISP TIS (BLADE) ×1 IMPLANT
BLADE SURG 15 STRL SS (BLADE) ×2
BNDG ADH 1X3 SHEER STRL LF (GAUZE/BANDAGES/DRESSINGS) ×4 IMPLANT
BNDG ADH THN 3X1 STRL LF (GAUZE/BANDAGES/DRESSINGS) ×2
CARTRIDGE OIL MAESTRO DRILL (MISCELLANEOUS) ×1 IMPLANT
CEMENT KYPHON CX01A KIT/MIXER (Cement) ×1 IMPLANT
DERMABOND ADVANCED (GAUZE/BANDAGES/DRESSINGS) ×1
DERMABOND ADVANCED .7 DNX12 (GAUZE/BANDAGES/DRESSINGS) IMPLANT
DEVICE BIOPSY BONE KYPHX (INSTRUMENTS) ×2 IMPLANT
DIFFUSER DRILL AIR PNEUMATIC (MISCELLANEOUS) ×2 IMPLANT
DRAPE C-ARM 42X72 X-RAY (DRAPES) ×2 IMPLANT
DRAPE HALF SHEET 40X57 (DRAPES) ×2 IMPLANT
DRAPE INCISE IOBAN 66X45 STRL (DRAPES) ×2 IMPLANT
DRAPE LAPAROTOMY 100X72X124 (DRAPES) ×2 IMPLANT
DRAPE WARM FLUID 44X44 (DRAPES) ×2 IMPLANT
DURAPREP 26ML APPLICATOR (WOUND CARE) ×2 IMPLANT
GAUZE 4X4 16PLY ~~LOC~~+RFID DBL (SPONGE) ×2 IMPLANT
GLOVE ECLIPSE 9.0 STRL (GLOVE) ×2 IMPLANT
GLOVE EXAM NITRILE XL STR (GLOVE) IMPLANT
GOWN STRL REUS W/ TWL LRG LVL3 (GOWN DISPOSABLE) ×1 IMPLANT
GOWN STRL REUS W/ TWL XL LVL3 (GOWN DISPOSABLE) IMPLANT
GOWN STRL REUS W/TWL 2XL LVL3 (GOWN DISPOSABLE) IMPLANT
GOWN STRL REUS W/TWL LRG LVL3 (GOWN DISPOSABLE) ×2
GOWN STRL REUS W/TWL XL LVL3 (GOWN DISPOSABLE)
INTRODUCER DEVICE OSTEO LEVEL (INTRODUCER) ×1 IMPLANT
KIT BASIN OR (CUSTOM PROCEDURE TRAY) ×2 IMPLANT
KIT TURNOVER KIT B (KITS) ×2 IMPLANT
NDL HYPO 25X1 1.5 SAFETY (NEEDLE) ×1 IMPLANT
NEEDLE HYPO 25X1 1.5 SAFETY (NEEDLE) ×2 IMPLANT
NS IRRIG 1000ML POUR BTL (IV SOLUTION) ×2 IMPLANT
OIL CARTRIDGE MAESTRO DRILL (MISCELLANEOUS) ×2
PACK EENT II TURBAN DRAPE (CUSTOM PROCEDURE TRAY) ×2 IMPLANT
PAD ARMBOARD 7.5X6 YLW CONV (MISCELLANEOUS) ×2 IMPLANT
STAPLER SKIN PROX WIDE 3.9 (STAPLE) ×2 IMPLANT
SUT VIC AB 2-0 CP2 18 (SUTURE) IMPLANT
SUT VIC AB 3-0 SH 8-18 (SUTURE) IMPLANT
SYR CONTROL 10ML LL (SYRINGE) ×2 IMPLANT
TOWEL GREEN STERILE (TOWEL DISPOSABLE) ×2 IMPLANT
TOWEL GREEN STERILE FF (TOWEL DISPOSABLE) ×2 IMPLANT
TRAY KYPHOPAK 15/3 ONESTEP 1ST (MISCELLANEOUS) IMPLANT
TRAY KYPHOPAK 20/3 ONESTEP 1ST (MISCELLANEOUS) IMPLANT

## 2021-12-13 NOTE — Brief Op Note (Signed)
12/13/2021  8:46 AM  PATIENT:  [name]  89 y.o. female  PRE-OPERATIVE DIAGNOSIS:  lumbar fracture  POST-OPERATIVE DIAGNOSIS:  lumbar fracture  PROCEDURE:  Procedure(s): Kyphoplasty - Lumbar one (N/A)  SURGEON:  Surgeon(s) and Role:    * Earnie Larsson, MD - Primary  PHYSICIAN ASSISTANT:   ASSISTANTS:    ANESTHESIA:   general  EBL:  minimal   BLOOD ADMINISTERED:none  DRAINS: none   LOCAL MEDICATIONS USED:  MARCAINE     SPECIMEN:  No Specimen  DISPOSITION OF SPECIMEN:  N/A  COUNTS:  YES  TOURNIQUET:  * No tourniquets in log *  DICTATION: .Dragon Dictation  PLAN OF CARE: Discharge to home after PACU  PATIENT DISPOSITION:  PACU - hemodynamically stable.   Delay start of Pharmacological VTE agent (>24hrs) due to surgical blood loss or risk of bleeding: yes

## 2021-12-13 NOTE — Discharge Instructions (Signed)

## 2021-12-13 NOTE — Anesthesia Procedure Notes (Signed)
Procedure Name: Intubation Date/Time: 12/13/2021 8:09 AM  Performed by: Dorann Lodge, CRNAPre-anesthesia Checklist: Patient identified, Emergency Drugs available, Suction available and Patient being monitored Patient Re-evaluated:Patient Re-evaluated prior to induction Oxygen Delivery Method: Circle System Utilized Preoxygenation: Pre-oxygenation with 100% oxygen Induction Type: IV induction Ventilation: Mask ventilation without difficulty Laryngoscope Size: Mac and 3 Grade View: Grade I Tube type: Oral Tube size: 7.0 mm Number of attempts: 1 Airway Equipment and Method: Stylet Placement Confirmation: ETT inserted through vocal cords under direct vision, positive ETCO2 and breath sounds checked- equal and bilateral Secured at: 22 cm Tube secured with: Tape Dental Injury: Teeth and Oropharynx as per pre-operative assessment

## 2021-12-13 NOTE — Transfer of Care (Signed)
Immediate Anesthesia Transfer of Care Note  Patient: [name]  Procedure(s) Performed: Kyphoplasty - Lumbar one (Back)  Patient Location: PACU  Anesthesia Type:General  Level of Consciousness: awake and drowsy  Airway & Oxygen Therapy: Patient Spontanous Breathing  Post-op Assessment: Report given to RN and Post -op Vital signs reviewed and stable  Post vital signs: Reviewed and stable. 5mg  Labetalol given. Dr. Smith Robert aware. PACU RN to follow up on blood pressure.  Last Vitals:  Vitals Value Taken Time  BP 207/104 12/13/21 0906  Temp    Pulse 89 12/13/21 0908  Resp 24 12/13/21 0908  SpO2 94 % 12/13/21 0908  Vitals shown include unvalidated device data.  Last Pain:  Vitals:   12/13/21 0626  TempSrc:   PainSc: 0-No pain         Complications: No notable events documented.

## 2021-12-13 NOTE — Op Note (Signed)
Date of procedure: 12/13/2021  Date of dictation: Same  Service: Neurosurgery  Preoperative diagnosis: Osteoporotic L1 compression fracture  Postoperative diagnosis: Same  Procedure Name: L1 kyphoplasty  Surgeon:Cherell Colvin A.Cordarius Benning, M.D.  Asst. Surgeon: None  Anesthesia: General  Indication: 88 year old female status post prior L2 kyphoplasty presents with worsening back pain.  Work-up demonstrates evidence of an acute L1 osteoporotic compression fracture.  The patient has failed conservative management over the ensuing few weeks.  She presents now for L1 kyphoplasty in hopes of improving her symptoms.  Operative note: After induction of anesthesia, patient position prone onto bolsters and she was appropriately padded.  Lumbar region localized with both AP and lateral fluoroscopy.  Lumbar region prepped and draped sterilely.  Incision made overlying the left L1 pedicle.  A Jamshidi needle introducer was then passed into the left L1 pedicle into the L1 vertebral body.  This took a lateral to medial trajectory crossing the midline.  A pilot hole was drilled within the vertebral body.  Vertebroplasty balloon was inflated and some fracture reduction was achieved.  Methylmethacrylate was mixed.  Methylmethacrylate was then instilled into the vertebral body of L1 with good filling in and circulation throughout the fracture area.  No evidence of any significant extravasation.  The cement was allowed to harden.  The introducer was removed.  Final images reveal good position of the kyphoplasty without any complicating features.  Wound was then closed with Vicryl sutures.  Sterile dressing was applied.  No apparent complications.
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
## 2021-12-13 NOTE — Anesthesia Postprocedure Evaluation (Signed)
Anesthesia Post Note  Patient: [name]  Procedure(s) Performed: Kyphoplasty - Lumbar one (Back)     Patient location during evaluation: PACU Anesthesia Type: General Level of consciousness: awake and alert Pain management: pain level controlled Vital Signs Assessment: post-procedure vital signs reviewed and stable Respiratory status: spontaneous breathing, nonlabored ventilation, respiratory function stable and patient connected to nasal cannula oxygen Cardiovascular status: blood pressure returned to baseline and stable Postop Assessment: no apparent nausea or vomiting Anesthetic complications: no   No notable events documented.  Last Vitals:  Vitals:   12/13/21 0930 12/13/21 0943  BP: (!) 163/99   Pulse: 66 68  Resp: 19 (!) 21  Temp:  (!) 36.2 C  SpO2: 94% 94%    Last Pain:  Vitals:   12/13/21 0943  TempSrc:   PainSc: 0-No pain                 Shelton Silvas

## 2021-12-15 ENCOUNTER — Encounter (HOSPITAL_COMMUNITY): Payer: Self-pay | Admitting: Neurosurgery

## 2021-12-16 ENCOUNTER — Other Ambulatory Visit: Payer: Self-pay | Admitting: Neurosurgery

## 2021-12-31 ENCOUNTER — Other Ambulatory Visit: Payer: Self-pay

## 2022-01-01 MED ORDER — ISOSORBIDE MONONITRATE ER 60 MG PO TB24: 60.0000 mg | ORAL_TABLET | Freq: Every day | ORAL | 3 refills | Status: AC

## 2022-01-21 DIAGNOSIS — I214 Non-ST elevation (NSTEMI) myocardial infarction: Secondary | ICD-10-CM | POA: Insufficient documentation

## 2022-01-23 ENCOUNTER — Ambulatory Visit: Payer: Medicare HMO | Admitting: Podiatry

## 2022-02-16 DIAGNOSIS — U071 COVID-19: Secondary | ICD-10-CM | POA: Insufficient documentation

## 2022-02-27 ENCOUNTER — Ambulatory Visit: Payer: Medicare HMO | Admitting: Podiatry

## 2022-02-27 ENCOUNTER — Encounter: Payer: Self-pay | Admitting: Podiatry

## 2022-02-27 DIAGNOSIS — G629 Polyneuropathy, unspecified: Secondary | ICD-10-CM

## 2022-02-27 DIAGNOSIS — M79674 Pain in right toe(s): Secondary | ICD-10-CM

## 2022-02-27 DIAGNOSIS — Q828 Other specified congenital malformations of skin: Secondary | ICD-10-CM

## 2022-02-27 DIAGNOSIS — B351 Tinea unguium: Secondary | ICD-10-CM | POA: Diagnosis not present

## 2022-02-27 DIAGNOSIS — M79675 Pain in left toe(s): Secondary | ICD-10-CM

## 2022-02-28 ENCOUNTER — Ambulatory Visit: Payer: Medicare HMO | Admitting: Cardiology

## 2022-03-06 NOTE — Progress Notes (Signed)
  Subjective:  Patient ID: [name], female    DOB: 16-Mar-1933,  MRN: [id number]  88 y.o. female presents painful thick toenails that are difficult to trim. Pain interferes with ambulation. Aggravating factors include wearing enclosed shoe gear. Pain is relieved with periodic professional debridement.  Patient is accompanied by her daughter on today's visit.  New problem(s): None   PCP is Oletha Blend, MD , and last visit was February 25, 2022  Allergies  Allergen Reactions   Tramadol    Amoxicillin Other (See Comments)    Patient tolerated Ancef in May and Zinacef in March 2017. Did it involve swelling of the face/tongue/throat, SOB, or low BP? Unknown Did it involve sudden or severe rash/hives, skin peeling, or any reaction on the inside of your mouth or nose? Unknown Did you need to seek medical attention at a hospital or doctor's office? Unknown When did it last happen?      unk If all above answers are "NO", may proceed with cephalosporin use.    Zetia [Ezetimibe] Other (See Comments)    DIZZINESS    Review of Systems: Negative except as noted in the HPI.   Objective:  [name] is a pleasant 88 y.o. female, frail, in NAD. AAO x 3.  Vascular Examination: CFT <4 seconds b/l LE. Palpable PT pulse(s) b/l LE. Faintly palpable DP pulse(s) RLE. Pedal hair absent. No pain with calf compression b/l. Trace edema noted BLE.  Neurological Examination: Protective sensation intact 5/5 intact bilaterally with 10g monofilament b/l. Vibratory sensation diminished b/l.  Dermatological Examination: Area of pressure noted medial aspect right 2nd toe from right great toenail abutting skin. No break in skin. No edema, no fluctuance, no pain. Pedal skin thin, shiny and atrophic b/l LE. No open wounds b/l LE. No interdigital macerations noted b/l LE. Toenails 1-5 b/l elongated, discolored, dystrophic, thickened, crumbly with subungual debris and tenderness to dorsal palpation.  Porokeratotic lesion(s) plantar heel pad of right foot. No erythema, no edema, no drainage, no fluctuance.  Musculoskeletal Examination: Muscle strength 4/5 to all lower extremity muscle groups bilaterally. No pain, crepitus or joint limitation noted with ROM bilateral LE.  Radiographs: None Assessment:   1. Pain due to onychomycosis of toenails of both feet   2. Porokeratosis   3. Neuropathy    Plan:  -Examined patient. -Area of pressure right 2nd toe should resolve with debridement of offending toenail of right great toe. No further treatment required. -Patient to continue soft, supportive shoe gear daily. -Toenails 1-5 b/l were debrided in length and girth with sterile nail nippers and dremel without iatrogenic bleeding.  -Porokeratotic lesion(s) plantar heel pad of right foot pared and enucleated with sterile scalpel blade without incident. Total number of lesions debrided=1. -Patient/POA to call should there be question/concern in the interim.  Return in about 3 months (around 05/30/2022).  [name], DPM

## 2022-03-14 ENCOUNTER — Other Ambulatory Visit: Payer: Self-pay

## 2022-03-14 MED ORDER — CARVEDILOL 12.5 MG PO TABS: 6.2500 mg | ORAL_TABLET | Freq: Two times a day (BID) | ORAL | 3 refills | Status: AC

## 2022-03-14 NOTE — Telephone Encounter (Signed)
This is Dr. Hochrein's pt. °

## 2022-04-24 ENCOUNTER — Other Ambulatory Visit: Payer: Self-pay | Admitting: Cardiology

## 2022-06-19 ENCOUNTER — Ambulatory Visit (INDEPENDENT_AMBULATORY_CARE_PROVIDER_SITE_OTHER): Payer: Medicare HMO | Admitting: Podiatry

## 2022-06-19 DIAGNOSIS — Z91199 Patient's noncompliance with other medical treatment and regimen due to unspecified reason: Secondary | ICD-10-CM

## 2022-06-19 NOTE — Progress Notes (Signed)
1. No-show for appointment    Patient recently hospitalized. No charge.

## 2022-08-07 DEATH — deceased
